# Patient Record
Sex: Male | Born: 1960 | Race: Black or African American | Hispanic: No | Marital: Single | Smoking: Current every day smoker
Health system: Southern US, Community
[De-identification: ages and names within clinical notes are randomized; demographics above are authoritative.]

## PROBLEM LIST (undated history)

## (undated) ENCOUNTER — Emergency Department (HOSPITAL_COMMUNITY): Payer: Self-pay | Source: Home / Self Care

## (undated) ENCOUNTER — Emergency Department (HOSPITAL_COMMUNITY): Disposition: A | Discharge status: Home / Self Care | Payer: Self-pay

## (undated) DIAGNOSIS — I10 Essential (primary) hypertension: Secondary | ICD-10-CM

## (undated) DIAGNOSIS — C801 Malignant (primary) neoplasm, unspecified: Secondary | ICD-10-CM

## (undated) DIAGNOSIS — M5020 Other cervical disc displacement, unspecified cervical region: Secondary | ICD-10-CM

## (undated) DIAGNOSIS — F329 Major depressive disorder, single episode, unspecified: Secondary | ICD-10-CM

## (undated) DIAGNOSIS — M1990 Unspecified osteoarthritis, unspecified site: Secondary | ICD-10-CM

## (undated) DIAGNOSIS — F32A Depression, unspecified: Secondary | ICD-10-CM

## (undated) DIAGNOSIS — C649 Malignant neoplasm of unspecified kidney, except renal pelvis: Secondary | ICD-10-CM

## (undated) DIAGNOSIS — I639 Cerebral infarction, unspecified: Secondary | ICD-10-CM

## (undated) DIAGNOSIS — M549 Dorsalgia, unspecified: Secondary | ICD-10-CM

## (undated) DIAGNOSIS — D573 Sickle-cell trait: Secondary | ICD-10-CM

## (undated) DIAGNOSIS — G8929 Other chronic pain: Secondary | ICD-10-CM

## (undated) DIAGNOSIS — M542 Cervicalgia: Secondary | ICD-10-CM

---

## 2009-12-05 ENCOUNTER — Emergency Department (HOSPITAL_COMMUNITY): Admission: EM | Admit: 2009-12-05 | Discharge: 2009-12-05 | Payer: Self-pay | Admitting: Family Medicine

## 2012-03-06 ENCOUNTER — Emergency Department (HOSPITAL_COMMUNITY)
Admission: EM | Admit: 2012-03-06 | Discharge: 2012-03-06 | Disposition: A | Discharge status: Home / Self Care | Payer: No Typology Code available for payment source | Attending: Emergency Medicine | Admitting: Emergency Medicine

## 2012-03-06 ENCOUNTER — Encounter (HOSPITAL_COMMUNITY): Payer: Self-pay | Admitting: Cardiology

## 2012-03-06 ENCOUNTER — Emergency Department (HOSPITAL_COMMUNITY): Payer: No Typology Code available for payment source

## 2012-03-06 DIAGNOSIS — Z043 Encounter for examination and observation following other accident: Secondary | ICD-10-CM | POA: Insufficient documentation

## 2012-03-06 DIAGNOSIS — Z041 Encounter for examination and observation following transport accident: Secondary | ICD-10-CM

## 2012-03-06 DIAGNOSIS — M542 Cervicalgia: Secondary | ICD-10-CM | POA: Insufficient documentation

## 2012-03-06 DIAGNOSIS — M549 Dorsalgia, unspecified: Secondary | ICD-10-CM | POA: Insufficient documentation

## 2012-03-06 MED ORDER — CYCLOBENZAPRINE HCL 10 MG PO TABS: 10.0000 mg | ORAL_TABLET | Freq: Two times a day (BID) | ORAL | Status: AC | PRN

## 2012-03-06 MED ORDER — TRAMADOL HCL 50 MG PO TABS: 50.0000 mg | ORAL_TABLET | Freq: Four times a day (QID) | ORAL | Status: AC | PRN
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
## 2012-03-09 NOTE — ED Provider Notes (Signed)
Medical screening examination/treatment/procedure(s) were performed by non-physician practitioner and as supervising physician I was immediately available for consultation/collaboration.   Cyndra Numbers, MD 03/09/12 1534

## 2013-08-12 ENCOUNTER — Emergency Department (INDEPENDENT_AMBULATORY_CARE_PROVIDER_SITE_OTHER)
Admission: EM | Admit: 2013-08-12 | Discharge: 2013-08-12 | Disposition: A | Discharge status: Home / Self Care | Payer: 59 | Source: Home / Self Care | Attending: Family Medicine | Admitting: Family Medicine

## 2013-08-12 ENCOUNTER — Encounter (HOSPITAL_COMMUNITY): Payer: Self-pay | Admitting: Emergency Medicine

## 2013-08-12 DIAGNOSIS — J069 Acute upper respiratory infection, unspecified: Secondary | ICD-10-CM

## 2013-08-12 DIAGNOSIS — A084 Viral intestinal infection, unspecified: Secondary | ICD-10-CM

## 2013-08-12 DIAGNOSIS — A088 Other specified intestinal infections: Secondary | ICD-10-CM

## 2013-08-12 MED ORDER — ONDANSETRON HCL 8 MG PO TABS: 8.0000 mg | ORAL_TABLET | Freq: Three times a day (TID) | ORAL | Status: DC | PRN

## 2013-08-12 MED ORDER — ONDANSETRON 4 MG PO TBDP
ORAL_TABLET | ORAL | Status: AC
  Filled 2013-08-12: qty 2

## 2013-08-12 MED ORDER — PROMETHAZINE-CODEINE 6.25-10 MG/5ML PO SYRP: 5.0000 mL | ORAL_SOLUTION | Freq: Every evening | ORAL | Status: DC | PRN

## 2013-08-12 MED ORDER — IPRATROPIUM BROMIDE 0.06 % NA SOLN: 2.0000 | Freq: Four times a day (QID) | NASAL | Status: DC

## 2013-08-12 MED ORDER — ONDANSETRON 4 MG PO TBDP
8.0000 mg | ORAL_TABLET | Freq: Once | ORAL | Status: AC
  Administered 2013-08-12: 8 mg via ORAL

## 2013-08-12 NOTE — ED Notes (Signed)
C/o  N/v/d.  Feeling feverish.  Headache.  Nonproductive cough.  Sob, denies chest pain, no hx of asthma.  Pt has not tried any otc meds.  Symptoms present since Friday.

## 2013-08-12 NOTE — Discharge Instructions (Signed)
Thank you for coming in today.  Use Tylenol as needed for pain fevers or chills. Use Atrovent nasal spray for runny nose. Use codeine containing cough medication at bedtime as needed for bad cough. Do not take and drive or go to work. Use Zofran every 8 hours as needed for vomiting. You can take it again at 6 PM. Followup with your primary care provider for blood pressure recheck

## 2013-08-12 NOTE — ED Provider Notes (Signed)
Patrick Romero is a 53 y.o. male who presents to Urgent Care today for 3 days of cough congestion runny nose and occasional nausea vomiting or diarrhea. The nausea and vomiting are improving. He is able to eat and drink normally. He denies significant abdominal pain. He's tried some TheraFlu which helped a little. The cough is quite bothersome especially at night. No significant shortness of breath.   History reviewed. No pertinent past medical history. History  Substance Use Topics  . Smoking status: Current Every Day Smoker -- 0.50 packs/day    Types: Cigarettes  . Smokeless tobacco: Not on file  . Alcohol Use: Yes   ROS as above Medications: No current facility-administered medications for this encounter.   Current Outpatient Prescriptions  Medication Sig Dispense Refill  . Aspirin-Salicylamide-Caffeine (BC HEADACHE POWDER PO) Take 2 packets by mouth every 4 (four) hours as needed. For pain      . ibuprofen (ADVIL,MOTRIN) 200 MG tablet Take 400 mg by mouth every 6 (six) hours as needed. For pain      . ipratropium (ATROVENT) 0.06 % nasal spray Place 2 sprays into both nostrils 4 (four) times daily.  15 mL  1  . Multiple Vitamins-Minerals (MULTIVITAMINS THER. W/MINERALS) TABS Take 1 tablet by mouth daily.      . ondansetron (ZOFRAN) 8 MG tablet Take 1 tablet (8 mg total) by mouth every 8 (eight) hours as needed for nausea or vomiting.  20 tablet  0  . promethazine-codeine (PHENERGAN WITH CODEINE) 6.25-10 MG/5ML syrup Take 5 mLs by mouth at bedtime as needed for cough.  120 mL  0    Exam:  BP 163/98  Pulse 92  Temp(Src) 98.6 F (37 C) (Oral)  Resp 20  SpO2 99% Gen: Well NAD HEENT: EOMI,  MMM posterior pharynx with cobblestoning. Tympanic membranes are normal appearing bilaterally. Lungs: Normal work of breathing. CTABL Heart: RRR no MRG Abd: NABS, Soft. NT, ND Exts: Brisk capillary refill, warm and well perfused.    Assessment and Plan: 53 y.o. male with viral illness  including respiratory and gastroenteritis.  Plan to treat with Atrovent nasal spray, codeine containing cough medication, Zofran. Work note provided. Followup as needed.  Recommend patient followup with his primary care provider for blood pressure  Discussed warning signs or symptoms. Please see discharge instructions. Patient expresses understanding.    [name], MD 08/12/13 1058

## 2013-08-23 ENCOUNTER — Emergency Department (HOSPITAL_COMMUNITY)
Admission: EM | Admit: 2013-08-23 | Discharge: 2013-08-23 | Disposition: A | Discharge status: Home / Self Care | Payer: 59 | Attending: Emergency Medicine | Admitting: Emergency Medicine

## 2013-08-23 ENCOUNTER — Encounter (HOSPITAL_COMMUNITY): Payer: Self-pay | Admitting: Emergency Medicine

## 2013-08-23 ENCOUNTER — Emergency Department (HOSPITAL_COMMUNITY): Payer: 59

## 2013-08-23 DIAGNOSIS — F17200 Nicotine dependence, unspecified, uncomplicated: Secondary | ICD-10-CM | POA: Insufficient documentation

## 2013-08-23 DIAGNOSIS — M79642 Pain in left hand: Secondary | ICD-10-CM

## 2013-08-23 DIAGNOSIS — Y93E9 Activity, other interior property and clothing maintenance: Secondary | ICD-10-CM | POA: Insufficient documentation

## 2013-08-23 DIAGNOSIS — Z79899 Other long term (current) drug therapy: Secondary | ICD-10-CM | POA: Insufficient documentation

## 2013-08-23 DIAGNOSIS — S6990XA Unspecified injury of unspecified wrist, hand and finger(s), initial encounter: Secondary | ICD-10-CM | POA: Insufficient documentation

## 2013-08-23 DIAGNOSIS — S298XXA Other specified injuries of thorax, initial encounter: Secondary | ICD-10-CM | POA: Insufficient documentation

## 2013-08-23 DIAGNOSIS — W19XXXA Unspecified fall, initial encounter: Secondary | ICD-10-CM

## 2013-08-23 DIAGNOSIS — W11XXXA Fall on and from ladder, initial encounter: Secondary | ICD-10-CM | POA: Insufficient documentation

## 2013-08-23 DIAGNOSIS — Y92009 Unspecified place in unspecified non-institutional (private) residence as the place of occurrence of the external cause: Secondary | ICD-10-CM | POA: Insufficient documentation

## 2013-08-23 DIAGNOSIS — R0789 Other chest pain: Secondary | ICD-10-CM

## 2013-08-23 DIAGNOSIS — S59909A Unspecified injury of unspecified elbow, initial encounter: Secondary | ICD-10-CM | POA: Insufficient documentation

## 2013-08-23 DIAGNOSIS — S59919A Unspecified injury of unspecified forearm, initial encounter: Secondary | ICD-10-CM

## 2013-08-23 NOTE — ED Notes (Signed)
Pt reports he was cleaning his mothers gutters Wednesday evening when he fell approx 10 feet landing on his Left side. Pt states he hit the grill first then the paved patio. Pt c/o Left lateral rib pain, Left hip pain, and Left arm pain. Pt presents with a steady gait, no assistant needed with ambulation, pain with lifting his Left arm. Pt reports the pain is more intense with deep breathing

## 2013-08-23 NOTE — Discharge Instructions (Signed)

## 2013-08-23 NOTE — ED Notes (Signed)
Pt also reports Left hand "feeling funny."

## 2013-08-24 NOTE — ED Provider Notes (Signed)
CSN: [id number]     Arrival date & time 08/23/13  1204 History   First MD Initiated Contact with Patient 08/23/13 1806     Chief Complaint  Patient presents with  . Fall    HPI Comments: 53 yo M no significant PMHx presents with CC of left hand, left chest wall pain s/p fall.  Pt states he was on ladder cleaning gutters on Wednesday, when he lost his balance and fell 10 ft down onto a grill.  He fell on his left side, hitting his left chest wall, left wrist, left hand.  He has had pain at these sites since that time.  He denies head trauma, LOC, neck pain.  He denies any other complaints including no lacerations, deformities, paresthesias, focal deficits.  Pt has been taking epsom salt baths, alcohol rubs, and taking OTC meds for pain with some benefit.  Pt presents today for further evaluation for possible injury sustained 2/2 fall.  The history is provided by the patient. No language interpreter was used.    History reviewed. No pertinent past medical history. History reviewed. No pertinent past surgical history. History reviewed. No pertinent family history. History  Substance Use Topics  . Smoking status: Current Every Day Smoker -- 0.50 packs/day    Types: Cigarettes  . Smokeless tobacco: Not on file  . Alcohol Use: Yes    Review of Systems  Constitutional: Negative for fever and chills.  Respiratory: Negative for cough and shortness of breath.   Cardiovascular: Positive for chest pain. Negative for palpitations and leg swelling.  Gastrointestinal: Negative for nausea, vomiting, abdominal pain, diarrhea and constipation.  Musculoskeletal: Positive for arthralgias. Negative for back pain, gait problem, joint swelling, myalgias, neck pain and neck stiffness.  Skin: Negative for rash and wound.  Neurological: Negative for dizziness, weakness, light-headedness, numbness and headaches.  Hematological: Negative for adenopathy. Does not bruise/bleed easily.  All other systems reviewed  and are negative.      Allergies  Review of patient's allergies indicates no known allergies.  Home Medications   Current Outpatient Rx  Name  Route  Sig  Dispense  Refill  . Aspirin-Salicylamide-Caffeine (BC HEADACHE POWDER PO)   Oral   Take 2 packets by mouth every 4 (four) hours as needed. For pain         . ibuprofen (ADVIL,MOTRIN) 200 MG tablet   Oral   Take 400 mg by mouth every 6 (six) hours as needed. For pain         . ipratropium (ATROVENT) 0.06 % nasal spray   Each Nare   Place 2 sprays into both nostrils 4 (four) times daily.   15 mL   1   . Multiple Vitamins-Minerals (MULTIVITAMINS THER. W/MINERALS) TABS   Oral   Take 1 tablet by mouth daily.         . promethazine-codeine (PHENERGAN WITH CODEINE) 6.25-10 MG/5ML syrup   Oral   Take 5 mLs by mouth at bedtime as needed for cough.   120 mL   0    BP 161/99  Pulse 76  Temp(Src) 98.2 F (36.8 C)  Resp 16  SpO2 100% Physical Exam  Nursing note and vitals reviewed. Constitutional: He is oriented to person, place, and time. He appears well-developed and well-nourished.  HENT:  Head: Normocephalic and atraumatic.  Right Ear: External ear normal.  Mouth/Throat: Oropharynx is clear and moist.  Eyes: Conjunctivae are normal. Pupils are equal, round, and reactive to light.  Neck: Normal range of  motion. Neck supple.  Cardiovascular: Normal rate, regular rhythm and normal heart sounds.   Pulmonary/Chest: Effort normal and breath sounds normal. No respiratory distress. He has no wheezes. He has no rales. He exhibits tenderness.  Left chest wall TTP.  No deformities or signs of external trauma.  Abdominal: He exhibits no distension and no mass. There is no tenderness. There is no rebound and no guarding.  Musculoskeletal: Normal range of motion. He exhibits tenderness. He exhibits no edema.  TTP of first three digits of right hand, medial left wrist.  No snuffbox TTP.    Neurological: He is alert and  oriented to person, place, and time.  CN II-XII grossly intact.  No global sensory or motor deficits.  Skin: Skin is warm and dry.    ED Course  Procedures (including critical care time) Labs Review Labs Reviewed - No data to display Imaging Review Dg Ribs Unilateral W/chest Left  08/23/2013   CLINICAL DATA:  Left lower lateral rib pain  EXAM: LEFT RIBS AND CHEST - 3+ VIEW  COMPARISON:  None.  FINDINGS: No fracture or other bone lesions are seen involving the ribs. There is no evidence of pneumothorax or pleural effusion. Both lungs are clear. Heart size and mediastinal contours are within normal limits.  IMPRESSION: Negative.   Electronically Signed   By: Kathreen Devoid   On: 08/23/2013 13:07   Dg Forearm Left  08/23/2013   CLINICAL DATA:  Left-sided pain  EXAM: LEFT FOREARM - 2 VIEW  COMPARISON:  None.  FINDINGS: There is no evidence of fracture or other focal bone lesions. Soft tissues are unremarkable.  IMPRESSION: Negative.   Electronically Signed   By: Kathreen Devoid   On: 08/23/2013 13:05   Dg Hand Complete Left  08/23/2013   CLINICAL DATA:  FALL, left hand pain  EXAM: LEFT HAND - COMPLETE 3+ VIEW  COMPARISON:  None.  FINDINGS: There is no evidence of fracture or dislocation. There is no evidence of arthropathy or other focal bone abnormality. Soft tissues are unremarkable.  IMPRESSION: Negative.   Electronically Signed   By: Kathreen Devoid   On: 08/23/2013 13:07    EKG Interpretation   None       MDM   Final diagnoses:  Fall  Chest wall pain  Left hand pain   53 yo M no significant PMHx presents with CC of left hand, left chest wall pain s/p fall.  Filed Vitals:   08/23/13 1838  BP: 161/99  Pulse: 76  Temp:   Resp: 16   Physical exam as above.  Pt with TTP of left chest wall, left hand, left wrist.  No other injuries apparent.  XRs are negative for rib fx, negative for left hand fx, negative for left wrist, or forearm fx.  No other images or labs required at this  time.  Pt states pain is well controlled with motrin at home and would like to continue this as outpatient.  Pt to be d/c home in good condition.  Encouraged to continue supportive care, RICE therapy.  F/u with PCP in 1 week.  Return precautions given.  Pt understands and agrees with plan.  I have discussed pt's care plan with Dr. Maryan Rued.  Sinda Du, MD      Sinda Du, MD 08/24/13 [phone]

## 2013-08-24 NOTE — ED Provider Notes (Signed)
I saw and evaluated the patient, reviewed the resident's note and I agree with the findings and plan.  EKG Interpretation   None       Pt with mechanical fall 2 days ago with rib, arm and hip pain.  However pt able to ambulate without difficulty.  No breathing trouble and o/w well appearing.  Plain films neg.  Will d/c.  Blanchie Dessert, MD 08/24/13 1527

## 2013-08-28 ENCOUNTER — Encounter (HOSPITAL_COMMUNITY): Payer: Self-pay | Admitting: Emergency Medicine

## 2013-08-28 ENCOUNTER — Emergency Department (HOSPITAL_COMMUNITY)
Admission: EM | Admit: 2013-08-28 | Discharge: 2013-08-28 | Disposition: A | Discharge status: Home / Self Care | Payer: 59 | Attending: Emergency Medicine | Admitting: Emergency Medicine

## 2013-08-28 ENCOUNTER — Telehealth (HOSPITAL_COMMUNITY): Payer: Self-pay

## 2013-08-28 DIAGNOSIS — Z09 Encounter for follow-up examination after completed treatment for conditions other than malignant neoplasm: Secondary | ICD-10-CM | POA: Insufficient documentation

## 2013-08-28 DIAGNOSIS — Z79899 Other long term (current) drug therapy: Secondary | ICD-10-CM | POA: Insufficient documentation

## 2013-08-28 DIAGNOSIS — F17200 Nicotine dependence, unspecified, uncomplicated: Secondary | ICD-10-CM | POA: Insufficient documentation

## 2013-08-28 NOTE — ED Notes (Signed)
Pt sts seen here on 2/13 after falling off ladder at work; pt given note to return to work but needs note stating left thumb is cleared for working as well

## 2013-08-28 NOTE — Discharge Instructions (Signed)
Read the information below.  You may return to the Emergency Department at any time for worsening condition or any new symptoms that concern you.  If you develop redness, swelling, pus draining from the wound, or fevers greater than 100.4, return to the ER immediately for a recheck.   °

## 2013-08-28 NOTE — ED Provider Notes (Signed)
Medical screening examination/treatment/procedure(s) were performed by non-physician practitioner and as supervising physician I was immediately available for consultation/collaboration.  EKG Interpretation   None         Hoy Morn, MD 08/28/13 1700

## 2013-08-28 NOTE — ED Provider Notes (Signed)
CSN: [id number]     Arrival date & time 08/28/13  1123 History  This chart was scribed for non-physician practitioner, Clayton Bibles, PA-C working with Hoy Morn, MD by Frederich Balding, ED scribe. This patient was seen in room TR06C/TR06C and the patient's care was started at 12:41 PM.   Chief Complaint  Patient presents with  . needs work note    The history is provided by the patient. No language interpreter was used.   HPI Comments: Patrick Romero is a 53 y.o. male who presents to the Emergency Department for a note to return to work. Pt was evaluated for left hand pain 5 days ago after falling off of a ladder at work. He denies current hand or thumb pain. States he has full use of his hands and no complaints.  Would like to go back to work.   History reviewed. No pertinent past medical history. History reviewed. No pertinent past surgical history. History reviewed. No pertinent family history. History  Substance Use Topics  . Smoking status: Current Every Day Smoker -- 0.50 packs/day    Types: Cigarettes  . Smokeless tobacco: Not on file  . Alcohol Use: Yes    Review of Systems  Constitutional: Negative for fever.  Musculoskeletal: Negative for arthralgias.  Skin: Negative for color change and wound.  Allergic/Immunologic: Negative for immunocompromised state.  Neurological: Negative for weakness and numbness.  Hematological: Does not bruise/bleed easily.  All other systems reviewed and are negative.   Allergies  Review of patient's allergies indicates no known allergies.  Home Medications   Current Outpatient Rx  Name  Route  Sig  Dispense  Refill  . Aspirin-Salicylamide-Caffeine (BC HEADACHE POWDER PO)   Oral   Take 2 packets by mouth every 4 (four) hours as needed. For pain         . ibuprofen (ADVIL,MOTRIN) 200 MG tablet   Oral   Take 400 mg by mouth every 6 (six) hours as needed. For pain         . ipratropium (ATROVENT) 0.06 % nasal spray   Each Nare  Place 2 sprays into both nostrils 4 (four) times daily.   15 mL   1   . Multiple Vitamins-Minerals (MULTIVITAMINS THER. W/MINERALS) TABS   Oral   Take 1 tablet by mouth daily.         . promethazine-codeine (PHENERGAN WITH CODEINE) 6.25-10 MG/5ML syrup   Oral   Take 5 mLs by mouth at bedtime as needed for cough.   120 mL   0    BP 154/103  Pulse 89  Temp(Src) 98.8 F (37.1 C) (Oral)  Resp 18  SpO2 100%  Physical Exam  Nursing note and vitals reviewed. Constitutional: He appears well-developed and well-nourished. No distress.  HENT:  Head: Normocephalic and atraumatic.  Neck: Neck supple.  Pulmonary/Chest: Effort normal.  Musculoskeletal:       Left hand: Normal.  Blood blister to distal tip of left thumb. Full active ROM of thumb. Sensation intact. Cap refill less than 2 seconds. Full strength. No tenderness.   Neurological: He is alert.  Skin: He is not diaphoretic.    ED Course  Procedures (including critical care time)  DIAGNOSTIC STUDIES: Oxygen Saturation is 100% on RA, normal by my interpretation.    COORDINATION OF CARE: 12:43 PM-Discussed treatment plan which includes a note to return to work with pt at bedside and pt agreed to plan.   Labs Review Labs Reviewed - No data  to display Imaging Review No results found.  EKG Interpretation   None       MDM   Final diagnoses:  Follow-up exam    Pt with injury to hand after fall from ladder.  Pt now without any complaints, requests a note to return to full duty at work.  Pt has a normal left hand exam.  Neurovascularly intact.  Only abnormality is small intact blister at distal fingertip.  This is not infected, not necrotic, nontender.  Pt to return to full duty as requested.  Discussed result, findings, treatment, and follow up  with patient.  Pt given return precautions.  Pt verbalizes understanding and agrees with plan.      I personally performed the services described in this documentation, which  was scribed in my presence. The recorded information has been reviewed and is accurate.   Clayton Bibles, PA-C 08/28/13 1354

## 2013-08-28 NOTE — ED Notes (Signed)
Pt to ED asking for release to return to work.  Pt was given work note having him return to work on 08/26/2013 without restrictions.  Pt to be revaluated if he needs note to be more in depth.

## 2013-08-28 NOTE — ED Notes (Signed)
Pt adamant about going back to work!

## 2013-12-16 ENCOUNTER — Encounter (HOSPITAL_COMMUNITY): Payer: Self-pay | Admitting: Emergency Medicine

## 2013-12-16 ENCOUNTER — Emergency Department (INDEPENDENT_AMBULATORY_CARE_PROVIDER_SITE_OTHER)
Admission: EM | Admit: 2013-12-16 | Discharge: 2013-12-16 | Disposition: A | Discharge status: Home / Self Care | Payer: Self-pay | Source: Home / Self Care | Attending: Family Medicine | Admitting: Family Medicine

## 2013-12-16 DIAGNOSIS — D1779 Benign lipomatous neoplasm of other sites: Secondary | ICD-10-CM

## 2013-12-16 DIAGNOSIS — D171 Benign lipomatous neoplasm of skin and subcutaneous tissue of trunk: Secondary | ICD-10-CM

## 2013-12-16 DIAGNOSIS — H547 Unspecified visual loss: Secondary | ICD-10-CM

## 2013-12-16 LAB — POCT I-STAT, CHEM 8
BUN: 11 mg/dL (ref 6–23)
CHLORIDE: 103 meq/L (ref 96–112)
CREATININE: 1.3 mg/dL (ref 0.50–1.35)
Calcium, Ion: 1.37 mmol/L — ABNORMAL HIGH (ref 1.12–1.23)
GLUCOSE: 77 mg/dL (ref 70–99)
HEMATOCRIT: 44 % (ref 39.0–52.0)
Hemoglobin: 15 g/dL (ref 13.0–17.0)
POTASSIUM: 4 meq/L (ref 3.7–5.3)
Sodium: 142 mEq/L (ref 137–147)
TCO2: 23 mmol/L (ref 0–100)

## 2013-12-16 NOTE — ED Provider Notes (Deleted)
CSN: [id number]     Arrival date & time 12/16/13  6440 History   First MD Initiated Contact with Patient 12/16/13 1106     Chief Complaint  Patient presents with  . Cyst    left lower back   (Consider location/radiation/quality/duration/timing/severity/associated sxs/prior Treatment) Patient is a 53 y.o. male presenting with back pain. The history is provided by the patient. No language interpreter was used.  Back Pain Location:  Thoracic spine Quality:  Aching Radiates to:  Does not radiate Pain severity:  No pain Pain is:  Worse during the day Onset quality:  Gradual Duration:  2 weeks Timing:  Constant Progression:  Worsening Chronicity:  New Relieved by:  Nothing Worsened by:  Nothing tried Ineffective treatments:  None tried Associated symptoms: no abdominal pain, no chest pain and no weakness   Pt reports he has increased visual difficulty both eyes.  No eye exam in over 10 years.   History reviewed. No pertinent past medical history. History reviewed. No pertinent past surgical history. History reviewed. No pertinent family history. History  Substance Use Topics  . Smoking status: Current Every Day Smoker -- 0.50 packs/day    Types: Cigarettes  . Smokeless tobacco: Not on file  . Alcohol Use: Yes    Review of Systems  Cardiovascular: Negative for chest pain.  Gastrointestinal: Negative for abdominal pain.  Musculoskeletal: Positive for back pain.  Neurological: Negative for weakness.  All other systems reviewed and are negative.   Allergies  Review of patient's allergies indicates no known allergies.  Home Medications   Prior to Admission medications   Medication Sig Start Date End Date Taking? Authorizing Provider  acetaminophen (TYLENOL) 500 MG tablet Take 1,000 mg by mouth every 6 (six) hours as needed for mild pain.   Yes Historical Provider, MD  Multiple Vitamins-Minerals (MULTIVITAMINS THER. W/MINERALS) TABS Take 1 tablet by mouth daily.   Yes  Historical Provider, MD  Aspirin-Salicylamide-Caffeine (BC HEADACHE POWDER PO) Take 2 packets by mouth every 4 (four) hours as needed. For pain    Historical Provider, MD  ibuprofen (ADVIL,MOTRIN) 200 MG tablet Take 400 mg by mouth every 6 (six) hours as needed. For pain    Historical Provider, MD   BP 114/80  Pulse 101  Temp(Src) 98.5 F (36.9 C) (Oral)  Resp 16  SpO2 98% Physical Exam  Constitutional: He is oriented to person, place, and time. He appears well-developed and well-nourished.  HENT:  Head: Normocephalic and atraumatic.  Right Ear: External ear normal.  Left Ear: External ear normal.  Nose: Nose normal.  Mouth/Throat: Oropharynx is clear and moist.  Eyes: Conjunctivae and EOM are normal. Pupils are equal, round, and reactive to light.  Neck: Normal range of motion. Neck supple.  Cardiovascular: Normal rate and regular rhythm.   Pulmonary/Chest: Effort normal.  Abdominal: Soft.  Musculoskeletal: Normal range of motion.  Neurological: He is alert and oriented to person, place, and time. He has normal reflexes.  Skin: Skin is warm.  Psychiatric: He has a normal mood and affect.    ED Course  Procedures (including critical care time) Labs Review Labs Reviewed  POCT I-STAT, CHEM 8 - Abnormal; Notable for the following:    Calcium, Ion 1.37 (*)    All other components within normal limits  I-STAT CHEM 8, ED    Imaging Review No results found.   MDM  istat  Glucose [address]      [place], [place] 12/16/13 [place], PA-C  12/24/13 [place], PA-C 12/24/13 1511

## 2013-12-16 NOTE — ED Notes (Signed)
Reports having a knot on the left lower back for 2 to 3 wks.  Having pain on left side with numbness.  Left sided headaches and pain on left side of neck.   Denies any known injury.  No relief with otc pain meds.

## 2013-12-25 NOTE — ED Provider Notes (Signed)
CSN: [id number]     Arrival date & time 12/16/13  1829 History   First MD Initiated Contact with Patient 12/16/13 1106     Chief Complaint  Patient presents with  . Cyst    left lower back   (Consider location/radiation/quality/duration/timing/severity/associated sxs/prior Treatment) HPI Comments: Pt has multiple complaints.  Pt reports he has a knot on his back that he has had for about 3 weeks.  Pt concerned about abscess.  Pt also concerned about diabetes and is concerned because vision is worse.  (wearing readers)  Last vision check greater than 10 years.  No primary care.  Pt also soreness in his neck and headaches of and on   The history is provided by the patient. No language interpreter was used.    History reviewed. No pertinent past medical history. History reviewed. No pertinent past surgical history. History reviewed. No pertinent family history. History  Substance Use Topics  . Smoking status: Current Every Day Smoker -- 0.50 packs/day    Types: Cigarettes  . Smokeless tobacco: Not on file  . Alcohol Use: Yes    Review of Systems  Eyes: Positive for visual disturbance.  All other systems reviewed and are negative.   Allergies  Review of patient's allergies indicates no known allergies.  Home Medications   Prior to Admission medications   Medication Sig Start Date End Date Taking? Authorizing Provider  acetaminophen (TYLENOL) 500 MG tablet Take 1,000 mg by mouth every 6 (six) hours as needed for mild pain.   Yes Historical Provider, MD  Multiple Vitamins-Minerals (MULTIVITAMINS THER. W/MINERALS) TABS Take 1 tablet by mouth daily.   Yes Historical Provider, MD  Aspirin-Salicylamide-Caffeine (BC HEADACHE POWDER PO) Take 2 packets by mouth every 4 (four) hours as needed. For pain    Historical Provider, MD  ibuprofen (ADVIL,MOTRIN) 200 MG tablet Take 400 mg by mouth every 6 (six) hours as needed. For pain    Historical Provider, MD   BP 114/80  Pulse 101  Temp(Src)  98.5 F (36.9 C) (Oral)  Resp 16  SpO2 98% Physical Exam  Nursing note and vitals reviewed. Constitutional: He is oriented to person, place, and time. He appears well-developed and well-nourished.  HENT:  Head: Normocephalic.  Eyes: Conjunctivae and EOM are normal. Pupils are equal, round, and reactive to light.  Neck: Normal range of motion.  Pulmonary/Chest: Effort normal.  Abdominal: He exhibits no distension.  Musculoskeletal: Normal range of motion.  Lipoma lower back  Neurological: He is alert and oriented to person, place, and time.  Psychiatric: He has a normal mood and affect.    ED Course  Procedures (including critical care time) Labs Review Labs Reviewed  POCT I-STAT, CHEM 8 - Abnormal; Notable for the following:    Calcium, Ion 1.37 (*)    All other components within normal limits  I-STAT CHEM 8, ED    Imaging Review No results found.   MDM Pt counseled on lipoma.   Pt needs primary care MD.   1. Lipoma of back   2. Visual impairment    Pt [facility] [place] [facility].   Pt advised he needs an eye exam    Fransico Meadow, PA-C 12/25/13 9371

## 2013-12-25 NOTE — ED Provider Notes (Signed)
Medical screening examination/treatment/procedure(s) were performed by a resident physician or non-physician practitioner and as the supervising physician I was immediately available for consultation/collaboration.  Lynne Leader, MD    [name], MD 12/25/13 2016

## 2014-03-20 ENCOUNTER — Emergency Department (INDEPENDENT_AMBULATORY_CARE_PROVIDER_SITE_OTHER)
Admission: EM | Admit: 2014-03-20 | Discharge: 2014-03-20 | Disposition: A | Discharge status: Home / Self Care | Payer: Self-pay | Source: Home / Self Care | Attending: Family Medicine | Admitting: Family Medicine

## 2014-03-20 ENCOUNTER — Ambulatory Visit (HOSPITAL_COMMUNITY): Payer: Self-pay | Attending: Family Medicine

## 2014-03-20 ENCOUNTER — Encounter (HOSPITAL_COMMUNITY): Payer: Self-pay | Admitting: Emergency Medicine

## 2014-03-20 DIAGNOSIS — G8929 Other chronic pain: Secondary | ICD-10-CM | POA: Insufficient documentation

## 2014-03-20 DIAGNOSIS — M545 Low back pain, unspecified: Secondary | ICD-10-CM

## 2014-03-20 DIAGNOSIS — M5412 Radiculopathy, cervical region: Secondary | ICD-10-CM

## 2014-03-20 DIAGNOSIS — M47812 Spondylosis without myelopathy or radiculopathy, cervical region: Secondary | ICD-10-CM | POA: Insufficient documentation

## 2014-03-20 MED ORDER — CYCLOBENZAPRINE HCL 5 MG PO TABS: 5.0000 mg | ORAL_TABLET | Freq: Every evening | ORAL | Status: DC | PRN

## 2014-03-20 MED ORDER — PREDNISONE 10 MG PO KIT: PACK | ORAL | Status: DC

## 2014-03-20 NOTE — ED Provider Notes (Signed)
MAXDEN NAJI is a 53 y.o. male who presents to Urgent Care today for back pain. Patient has a history of chronic back and neck pain. He was seen for this problem back in June. He would like to followup for this issue. He's tried multiple over-the-counter medications which have not helped. The back pain is bilateral as is the neck pain. He denies any significant lower extremity radiating pain but does note some left upper extremity radiating pain to his hand. He denies any weakness or numbness throughout. No bowel bladder dysfunction or difficulty walking. Additionally he has a small mass in his right low back that he attributes to the pain. This was also present has been unchanged since June. In June it was thought to be a lipoma. No fevers or chills nausea vomiting or diarrhea.   History reviewed. No pertinent past medical history. History  Substance Use Topics  . Smoking status: Current Every Day Smoker -- 0.50 packs/day    Types: Cigarettes  . Smokeless tobacco: Not on file  . Alcohol Use: Yes   ROS as above Medications: No current facility-administered medications for this encounter.   Current Outpatient Prescriptions  Medication Sig Dispense Refill  . acetaminophen (TYLENOL) 500 MG tablet Take 1,000 mg by mouth every 6 (six) hours as needed for mild pain.      . Aspirin-Salicylamide-Caffeine (BC HEADACHE POWDER PO) Take 2 packets by mouth every 4 (four) hours as needed. For pain      . cyclobenzaprine (FLEXERIL) 5 MG tablet Take 1 tablet (5 mg total) by mouth at bedtime as needed for muscle spasms.  20 tablet  0  . ibuprofen (ADVIL,MOTRIN) 200 MG tablet Take 400 mg by mouth every 6 (six) hours as needed. For pain      . Multiple Vitamins-Minerals (MULTIVITAMINS THER. W/MINERALS) TABS Take 1 tablet by mouth daily.      . PredniSONE 10 MG KIT 12 day dose pack po  1 kit  0    Exam:  BP 140/90  Pulse 91  Temp(Src) 98.6 F (37 C) (Oral)  Resp 16  SpO2 98% Gen: Well NAD HEENT:  EOMI,  MMM Lungs: Normal work of breathing. CTABL Heart: RRR no MRG Abd: NABS, Soft. Nondistended, Nontender Exts: Brisk capillary refill, warm and well perfused.  Back: Nontender to lumbar spinal midline. Right low back area small soft mobile mass consistent with lipoma is present. Patient is tender at the bilateral SI joints and lumbar paraspinals. Low back range of motion flexion and extension are decreased due to pain. Lower extremity strength reflexes and sensation are normal and equal bilaterally. Neck: Nontender to spinal midline. Mildly tender bilateral cervical paraspinals and trapezius. Range of motion normal to flexion extension limited bilateral rotation range of motion secondary to pain. Limited lateral flexion bilaterally secondary to pain. Upper extremity strength sensation and reflexes are intact throughout.  No results found for this or any previous visit (from the past 24 hour(s)). Dg Cervical Spine 2-3 Views  03/20/2014   CLINICAL DATA:  Chronic neck pain.  EXAM: CERVICAL SPINE - 2-3 VIEW  COMPARISON:  Cervical spine radiographs 03/06/2012  FINDINGS: The cervical spine is visualized from the skullbase through C7-T1. Dental caries and periodontal disease are evident. Prevertebral soft tissues are within normal limits. Vertebral body heights and alignment are normal.  Endplate degenerative changes are evident at C5-6 and C6-7 with some progression since the prior study. No acute fracture or traumatic subluxation is evident.  IMPRESSION: Progressive endplate degenerative change  in uncovertebral disease at C5-6 and C6-7 without acute abnormality.   Electronically Signed   By: Lawrence Santiago M.D.   On: 03/20/2014 10:56   Dg Lumbar Spine 2-3 Views  03/20/2014   CLINICAL DATA:  Chronic back pain.  EXAM: LUMBAR SPINE - 2-3 VIEW  COMPARISON:  03/06/2012  FINDINGS: There is no evidence of lumbar spine fracture. Alignment is normal. Intervertebral disc spaces are maintained.  IMPRESSION:  Normal exam.   Electronically Signed   By: Rozetta Nunnery M.D.   On: 03/20/2014 10:58    Assessment and Plan: 53 y.o. male with  1) cervical pain and radiculopathy: Likely due to DDD. Plan treat with prednisone and followup with sports medicine 2) lumbago: Likely due to myofascial disruption. Plan as above 3) lipoma: Followup with Gen. surgery as noted previous notes.  Discussed warning signs or symptoms. Please see discharge instructions. Patient expresses understanding.   This note was created using Systems analyst. Any transcription errors are unintended.    [name], MD 03/20/14 1131

## 2014-03-20 NOTE — Discharge Instructions (Signed)
Thank you for coming in today. Take prednisone as directed for 12 days Use Flexeril at bedtime as needed Followup the sports [place] back or go to the emergency room if you notice new weakness new numbness problems walking or bowel or bladder problems.   Cervical Radiculopathy Cervical radiculopathy happens when a nerve in the neck is pinched or bruised by a slipped (herniated) disk or by arthritic changes in the bones of the cervical spine. This can occur due to an injury or as part of the normal aging process. Pressure on the cervical nerves can cause pain or numbness that runs from your neck all the way down into your arm and fingers. CAUSES  There are many possible causes, including:  Injury.  Muscle tightness in the neck from overuse.  Swollen, painful joints (arthritis).  Breakdown or degeneration in the bones and joints of the spine (spondylosis) due to aging.  Bone spurs that may develop near the cervical nerves. SYMPTOMS  Symptoms include pain, weakness, or numbness in the affected arm and hand. Pain can be severe or irritating. Symptoms may be worse when extending or turning the neck. DIAGNOSIS  Your caregiver will ask about your symptoms and do a physical exam. He or she may test your strength and reflexes. X-rays, CT scans, and MRI scans may be needed in cases of injury or if the symptoms do not go away after a period of time. Electromyography (EMG) or nerve conduction testing may be done to study how your nerves and muscles are working. TREATMENT  Your caregiver may recommend certain exercises to help relieve your symptoms. Cervical radiculopathy can, and often does, get better with time and treatment. If your problems continue, treatment options may include:  Wearing a soft collar for short periods of time.  Physical therapy to strengthen the neck muscles.  Medicines, such as nonsteroidal anti-inflammatory drugs (NSAIDs), oral corticosteroids, or spinal  injections.  Surgery. Different types of surgery may be done depending on the cause of your problems. HOME CARE INSTRUCTIONS   Put ice on the affected area.  Put ice in a plastic bag.  Place a towel between your skin and the bag.  Leave the ice on for 15-20 minutes, 03-04 times a day or as directed by your caregiver.  If ice does not help, you can try using heat. Take a warm shower or bath, or use a hot water bottle as directed by your caregiver.  You may try a gentle neck and shoulder massage.  Use a flat pillow when you sleep.  Only take over-the-counter or prescription medicines for pain, discomfort, or fever as directed by your caregiver.  If physical therapy was prescribed, follow your caregiver's directions.  If a soft collar was prescribed, use it as directed. SEEK IMMEDIATE MEDICAL CARE IF:   Your pain gets much worse and cannot be controlled with medicines.  You have weakness or numbness in your hand, arm, face, or leg.  You have a high fever or a stiff, rigid neck.  You lose bowel or bladder control (incontinence).  You have trouble with walking, balance, or speaking. MAKE SURE YOU:   Understand these instructions.  Will watch your condition.  Will get help right away if you are not doing well or get worse. Document Released: 03/22/2001 Document Revised: 09/19/2011 Document Reviewed: 02/08/2011 [facility] Patient Information 2015 [place], [place]. This information is not intended to replace advice given to you by your health care provider. Make sure you discuss any questions you  have with your health care provider. ° °

## 2014-03-20 NOTE — ED Notes (Signed)
C/o lower back pain that radiates across and up back.  Limited ROM of neck.  On set June.  No relief with otc meds.  Denies injury.

## 2015-03-21 ENCOUNTER — Encounter (HOSPITAL_COMMUNITY): Payer: Self-pay | Admitting: Emergency Medicine

## 2015-03-21 ENCOUNTER — Emergency Department (HOSPITAL_COMMUNITY)
Admission: EM | Admit: 2015-03-21 | Discharge: 2015-03-21 | Disposition: A | Discharge status: Home / Self Care | Payer: Self-pay | Attending: Emergency Medicine | Admitting: Emergency Medicine

## 2015-03-21 DIAGNOSIS — Z720 Tobacco use: Secondary | ICD-10-CM | POA: Insufficient documentation

## 2015-03-21 DIAGNOSIS — M542 Cervicalgia: Secondary | ICD-10-CM

## 2015-03-21 DIAGNOSIS — Z79899 Other long term (current) drug therapy: Secondary | ICD-10-CM | POA: Insufficient documentation

## 2015-03-21 DIAGNOSIS — M545 Low back pain: Secondary | ICD-10-CM | POA: Insufficient documentation

## 2015-03-21 DIAGNOSIS — M5030 Other cervical disc degeneration, unspecified cervical region: Secondary | ICD-10-CM | POA: Insufficient documentation

## 2015-03-21 HISTORY — DX: Other cervical disc displacement, unspecified cervical region: M50.20

## 2015-03-21 MED ORDER — TRAMADOL HCL 50 MG PO TABS
50.0000 mg | ORAL_TABLET | Freq: Once | ORAL | Status: AC
  Administered 2015-03-21: 50 mg via ORAL
  Filled 2015-03-21: qty 1

## 2015-03-21 MED ORDER — METHOCARBAMOL 500 MG PO TABS
500.0000 mg | ORAL_TABLET | Freq: Once | ORAL | Status: AC
  Administered 2015-03-21: 500 mg via ORAL
  Filled 2015-03-21: qty 1

## 2015-03-21 MED ORDER — METHOCARBAMOL 500 MG PO TABS: 500.0000 mg | ORAL_TABLET | Freq: Two times a day (BID) | ORAL | Status: DC | PRN

## 2015-03-21 MED ORDER — TRAMADOL HCL 50 MG PO TABS: 50.0000 mg | ORAL_TABLET | Freq: Four times a day (QID) | ORAL | Status: DC | PRN

## 2015-03-21 NOTE — Discharge Instructions (Signed)
Cervical Radiculopathy Cervical radiculopathy happens when a nerve in the neck is pinched or bruised by a slipped (herniated) disk or by arthritic changes in the bones of the cervical spine. This can occur due to an injury or as part of the normal aging process. Pressure on the cervical nerves can cause pain or numbness that runs from your neck all the way down into your arm and fingers. CAUSES  There are many possible causes, including:  Injury.  Muscle tightness in the neck from overuse.  Swollen, painful joints (arthritis).  Breakdown or degeneration in the bones and joints of the spine (spondylosis) due to aging.  Bone spurs that may develop near the cervical nerves. SYMPTOMS  Symptoms include pain, weakness, or numbness in the affected arm and hand. Pain can be severe or irritating. Symptoms may be worse when extending or turning the neck. DIAGNOSIS  Your caregiver will ask about your symptoms and do a physical exam. He or she may test your strength and reflexes. X-rays, CT scans, and MRI scans may be needed in cases of injury or if the symptoms do not go away after a period of time. Electromyography (EMG) or nerve conduction testing may be done to study how your nerves and muscles are working. TREATMENT  Your caregiver may recommend certain exercises to help relieve your symptoms. Cervical radiculopathy can, and often does, get better with time and treatment. If your problems continue, treatment options may include:  Wearing a soft collar for short periods of time.  Physical therapy to strengthen the neck muscles.  Medicines, such as nonsteroidal anti-inflammatory drugs (NSAIDs), oral corticosteroids, or spinal injections.  Surgery. Different types of surgery may be done depending on the cause of your problems. HOME CARE INSTRUCTIONS   Put ice on the affected area.  Put ice in a plastic bag.  Place a towel between your skin and the bag.  Leave the ice on for 15-20 minutes,  03-04 times a day or as directed by your caregiver.  If ice does not help, you can try using heat. Take a warm shower or bath, or use a hot water bottle as directed by your caregiver.  You may try a gentle neck and shoulder massage.  Use a flat pillow when you sleep.  Only take over-the-counter or prescription medicines for pain, discomfort, or fever as directed by your caregiver.  If physical therapy was prescribed, follow your caregiver's directions.  If a soft collar was prescribed, use it as directed. SEEK IMMEDIATE MEDICAL CARE IF:   Your pain gets much worse and cannot be controlled with medicines.  You have weakness or numbness in your hand, arm, face, or leg.  You have a high fever or a stiff, rigid neck.  You lose bowel or bladder control (incontinence).  You have trouble with walking, balance, or speaking. MAKE SURE YOU:   Understand these instructions.  Will watch your condition.  Will get help right away if you are not doing well or get worse. Document Released: 03/22/2001 Document Revised: 09/19/2011 Document Reviewed: 02/08/2011 [facility] Patient Information 2015 [place], [place]. This information is not intended to replace advice given to you by your health care provider. Make sure you discuss any questions you have with your health care provider. Back Pain, Adult Low back pain is very common. About 1 in 5 people have back pain.The cause of low back pain is rarely dangerous. The pain often gets better over time.About half of people with a sudden onset of back pain  feel better in just 2 weeks. About 8 in 10 people feel better by 6 weeks.  CAUSES Some common causes of back pain include:  Strain of the muscles or ligaments supporting the spine.  Wear and tear (degeneration) of the spinal discs.  Arthritis.  Direct injury to the back. DIAGNOSIS Most of the time, the direct cause of low back pain is not known.However, back pain can be treated effectively even  when the exact cause of the pain is unknown.Answering your caregiver's questions about your overall health and symptoms is one of the most accurate ways to make sure the cause of your pain is not dangerous. If your caregiver needs more information, he or she may order lab work or imaging tests (X-rays or MRIs).However, even if imaging tests show changes in your back, this usually does not require surgery. HOME CARE INSTRUCTIONS For many people, back pain returns.Since low back pain is rarely dangerous, it is often a condition that people can learn to [facility] their own.   Remain active. It is stressful on the back to sit or stand in one place. Do not sit, drive, or stand in one place for more than 30 minutes at a time. Take short walks on level surfaces as soon as pain allows.Try to increase the length of time you walk each day.  Do not stay in bed.Resting more than 1 or 2 days can delay your recovery.  Do not avoid exercise or work.Your body is made to move.It is not dangerous to be active, even though your back may hurt.Your back will likely heal faster if you return to being active before your pain is gone.  Pay attention to your body when you bend and lift. Many people have less discomfortwhen lifting if they bend their knees, keep the load close to their bodies,and avoid twisting. Often, the most comfortable positions are those that put less stress on your recovering back.  Find a comfortable position to sleep. Use a firm mattress and lie on your side with your knees slightly bent. If you lie on your back, put a pillow under your knees.  Only take over-the-counter or prescription medicines as directed by your caregiver. Over-the-counter medicines to reduce pain and inflammation are often the most helpful.Your caregiver may prescribe muscle relaxant drugs.These medicines help dull your pain so you can more quickly return to your normal activities and healthy exercise.  Put ice on  the injured area.  Put ice in a plastic bag.  Place a towel between your skin and the bag.  Leave the ice on for 15-20 minutes, 03-04 times a day for the first 2 to 3 days. After that, ice and heat may be alternated to reduce pain and spasms.  Ask your caregiver about trying back exercises and gentle massage. This may be of some benefit.  Avoid feeling anxious or stressed.Stress increases muscle tension and can worsen back pain.It is important to recognize when you are anxious or stressed and learn ways to manage it.Exercise is a great option. SEEK MEDICAL CARE IF:  You have pain that is not relieved with rest or medicine.  You have pain that does not improve in 1 week.  You have new symptoms.  You are generally not feeling well. SEEK IMMEDIATE MEDICAL CARE IF:   You have pain that radiates from your back into your legs.  You develop new bowel or bladder control problems.  You have unusual weakness or numbness in your arms or legs.  You develop  nausea or vomiting.  You develop abdominal pain.  You feel faint. Document Released: 06/27/2005 Document Revised: 12/27/2011 Document Reviewed: 10/29/2013 [facility] Patient Information 2015 [place], [place]. This information is not intended to replace advice given to you by your health care provider. Make sure you discuss any questions you have with your health care provider.  Back Exercises Back exercises help treat and prevent back injuries. The goal of back exercises is to increase the strength of your abdominal and back muscles and the flexibility of your back. These exercises should be started when you no longer have back pain. Back exercises include:  Pelvic Tilt. Lie on your back with your knees bent. Tilt your pelvis until the lower part of your back is against the floor. Hold this position 5 to 10 sec and repeat 5 to 10 times.  Knee to Chest. Pull first 1 knee up against your chest and hold for 20 to 30 seconds, repeat this  with the other knee, and then both knees. This may be done with the other leg straight or bent, whichever feels better.  Sit-Ups or Curl-Ups. Bend your knees 90 degrees. Start with tilting your pelvis, and do a partial, slow sit-up, lifting your trunk only 30 to 45 degrees off the floor. Take at least 2 to 3 seconds for each sit-up. Do not do sit-ups with your knees out straight. If partial sit-ups are difficult, simply do the above but with only tightening your abdominal muscles and holding it as directed.  Hip-Lift. Lie on your back with your knees flexed 90 degrees. Push down with your feet and shoulders as you raise your hips a couple inches off the floor; hold for 10 seconds, repeat 5 to 10 times.  Back arches. Lie on your stomach, propping yourself up on bent elbows. Slowly press on your hands, causing an arch in your low back. Repeat 3 to 5 times. Any initial stiffness and discomfort should lessen with repetition over time.  Shoulder-Lifts. Lie face down with arms beside your body. Keep hips and torso pressed to floor as you slowly lift your head and shoulders off the floor. Do not overdo your exercises, especially in the beginning. Exercises may cause you some mild back discomfort which lasts for a few minutes; however, if the pain is more severe, or lasts for more than 15 minutes, do not continue exercises until you see your caregiver. Improvement with exercise therapy for back problems is slow.  See your caregivers for assistance with developing a proper back exercise program. Document Released: 08/04/2004 Document Revised: 09/19/2011 Document Reviewed: 04/28/2011 [facility] Patient Information 2015 [place], [place]. This information is not intended to replace advice given to you by your health care provider. Make sure you discuss any questions you have with your health care provider.

## 2015-03-21 NOTE — ED Provider Notes (Signed)
CSN: [id number]     Arrival date & time 03/21/15  1149 History   First MD Initiated Contact with Patient 03/21/15 1716     Chief Complaint  Patient presents with  . Headache    Patrick Romero is a 54 y.o. male with a history of cervical DDD who presents to the ED complaining of bilateral neck pain and muscle spasm and bilateral low back pain. The patient reports that he has a history of DDD in his neck and has chronic pain there. He reports his pain has been worse over the past 25 days. He denies new injury to his neck or back. He reports it feels like his muscles are in spasm. He reports he has previously taken a muscle relaxer which helped with his muscle spasm pain but he no longer has any. He also reports bilateral low back pain which is chronic and feels like muscle spasm. Currently complains of 10 out of 10 neck and low back pain. He reports he's been taking Aleve at home intermittently with little relief. Patient also reports he has tingling in his bilateral hands that is been intermittent for several months. Patient reports his pain in his back is worse with movement or bending. The patient denies fevers, chills, injury, trauma, numbness, weakness, urinary symptoms, loss of bladder control, loss of bowel control, history of cancer, history of IVDU, abdominal pain, nausea, vomiting headaches, or neck stiffness.  (Consider location/radiation/quality/duration/timing/severity/associated sxs/prior Treatment) HPI  Past Medical History  Diagnosis Date  . Herniated disc, cervical    History reviewed. No pertinent past surgical history. No family history on file. Social History  Substance Use Topics  . Smoking status: Current Every Day Smoker -- 0.25 packs/day    Types: Cigarettes  . Smokeless tobacco: None  . Alcohol Use: Yes    Review of Systems  Constitutional: Negative for fever and chills.  HENT: Negative for congestion and sore throat.   Eyes: Negative for visual disturbance.   Respiratory: Negative for cough, shortness of breath and wheezing.   Cardiovascular: Negative for chest pain and palpitations.  Gastrointestinal: Negative for nausea, vomiting, abdominal pain and diarrhea.  Genitourinary: Negative for dysuria, urgency, frequency, hematuria, decreased urine volume and difficulty urinating.  Musculoskeletal: Positive for back pain and neck pain. Negative for myalgias, gait problem and neck stiffness.  Skin: Negative for rash.  Neurological: Negative for weakness, light-headedness, numbness and headaches.      Allergies  Review of patient's allergies indicates no known allergies.  Home Medications   Prior to Admission medications   Medication Sig Start Date End Date Taking? Authorizing Provider  acetaminophen (TYLENOL) 500 MG tablet Take 1,000 mg by mouth every 6 (six) hours as needed for mild pain.    Historical Provider, MD  Aspirin-Salicylamide-Caffeine (BC HEADACHE POWDER PO) Take 2 packets by mouth every 4 (four) hours as needed. For pain    Historical Provider, MD  cyclobenzaprine (FLEXERIL) 5 MG tablet Take 1 tablet (5 mg total) by mouth at bedtime as needed for muscle spasms. 03/20/14   [name], MD  ibuprofen (ADVIL,MOTRIN) 200 MG tablet Take 400 mg by mouth every 6 (six) hours as needed. For pain    Historical Provider, MD  methocarbamol (ROBAXIN) 500 MG tablet Take 1 tablet (500 mg total) by mouth 2 (two) times daily as needed for muscle spasms. 03/21/15   [name], PA-C  Multiple Vitamins-Minerals (MULTIVITAMINS THER. W/MINERALS) TABS Take 1 tablet by mouth daily.    Historical Provider, MD  PredniSONE 10 MG KIT 12 day dose pack po 03/20/14   [name], MD  traMADol (ULTRAM) 50 MG tablet Take 1 tablet (50 mg total) by mouth every 6 (six) hours as needed. 03/21/15   [name], PA-C   BP 155/94 mmHg  Pulse 63  Temp(Src) 97.7 F (36.5 C) (Oral)  Resp 18  Ht '6\' 1"'  (1.854 m)  Wt 155 lb (70.308 kg)  BMI 20.45 kg/m2  SpO2  100% Physical Exam  Constitutional: He is oriented to person, place, and time. He appears well-developed and well-nourished. No distress.  Nontoxic appearing.  HENT:  Head: Normocephalic and atraumatic.  Mouth/Throat: Oropharynx is clear and moist.  Eyes: Conjunctivae are normal. Pupils are equal, round, and reactive to light. Right eye exhibits no discharge. Left eye exhibits no discharge.  Neck: Normal range of motion. Neck supple. No JVD present. No tracheal deviation present.  No midline neck tenderness. The patient is able to look down with his neck and up to the ceiling. He is able to rotate his neck greater than 45 in each direction. He has tenderness over his bilateral trapezius muscles.   Cardiovascular: Normal rate, regular rhythm, normal heart sounds and intact distal pulses.  Exam reveals no gallop and no friction rub.   No murmur heard. Bilateral radial pulses are intact.  Pulmonary/Chest: Effort normal and breath sounds normal. No respiratory distress. He has no wheezes. He has no rales.  Lungs are clear to auscultation bilaterally.  Abdominal: Soft. There is no tenderness.  Musculoskeletal: Normal range of motion. He exhibits no edema or tenderness.  No lower extremity edema or tenderness. The patient is able to place his arms above his head. The patient has 5 out of 5 strength in his bilateral upper and lower extremities. He is able to ambulate without difficulty or assistance and with normal gait. No midline back tenderness. No back edema, deformity, ecchymosis or erythema noted.  Lymphadenopathy:    He has no cervical adenopathy.  Neurological: He is alert and oriented to person, place, and time. He has normal reflexes. He displays normal reflexes. Coordination normal.  The patient is alert and oriented 3. Sensation is intact bilateral upper and lower extremities. Good and equal grip strengths bilaterally. He is able to ambulatory with normal gait. Bilateral patellar DTRs are  intact.  Skin: Skin is warm and dry. No rash noted. He is not diaphoretic. No erythema. No pallor.  Psychiatric: He has a normal mood and affect. His behavior is normal.  Nursing note and vitals reviewed.   ED Course  Procedures (including critical care time) Labs Review Labs Reviewed - No data to display  Imaging Review No results found. I have personally reviewed and evaluated these images and lab results as part of my medical decision-making.   EKG Interpretation None      Filed Vitals:   03/21/15 1742 03/21/15 1742 03/21/15 1744 03/21/15 1751  BP:  142/89 155/94 155/94  Pulse: 64 64 70 63  Temp:      TempSrc:      Resp:  18  18  Height:      Weight:      SpO2: 100% 100% 99% 100%     MDM   Meds given in ED:  Medications  traMADol (ULTRAM) tablet 50 mg (50 mg Oral Given 03/21/15 1802)  methocarbamol (ROBAXIN) tablet 500 mg (500 mg Oral Given 03/21/15 1802)    Discharge Medication List as of 03/21/2015  5:49 PM  START taking these medications   Details  methocarbamol (ROBAXIN) 500 MG tablet Take 1 tablet (500 mg total) by mouth 2 (two) times daily as needed for muscle spasms., Starting 03/21/2015, Until Discontinued, Print    traMADol (ULTRAM) 50 MG tablet Take 1 tablet (50 mg total) by mouth every 6 (six) hours as needed., Starting 03/21/2015, Until Discontinued, Print        Final diagnoses:  Neck pain, bilateral  DDD (degenerative disc disease), cervical  Bilateral low back pain, with sciatica presence unspecified   Patient with bilateral low back pain and bilateral neck pain with history of cervical DDD. No new injury or trauma.  No neurological deficits and normal neuro exam.  Patient can walk without difficulty or assistance and with normal gait.  No loss of bowel or bladder control.  No concern for cauda equina.  No fever, night sweats, weight loss, h/o cancer, IVDU.  RICE protocol and pain medicine indicated and discussed with patient. Patient has had a  creatinine of 1.30 year ago. Will not give the patient any NSAIDs. Advised patient to avoid NSAIDs at this time. Advised him to have his creatinine rechecked by his primary care provider. Patient has also previously seen a sports medicine physician at [facility]. I encouraged him to follow-up with this provider. Patient given Robaxin and Ultram in the emergency department. I advised to use caution while taking these medicines as they can make him drowsy. I advised not to drive while taking these medications. I advised the patient to follow-up with their primary care provider this week. I advised the patient to return to the emergency department with new or worsening symptoms or new concerns. The patient verbalized understanding and agreement with plan.     [name], PA-C 03/21/15 1833  Dorie Rank, MD 03/22/15 [phone]

## 2015-03-21 NOTE — ED Notes (Signed)
Pt reports headache and neck pain which radiates to his back. Pt was diagnosed 2 months ago with herniated disk in his neck and back. Pt alert x 4. Pt also report blurred vision x 2 months.

## 2016-07-11 DIAGNOSIS — C649 Malignant neoplasm of unspecified kidney, except renal pelvis: Secondary | ICD-10-CM

## 2016-07-11 HISTORY — DX: Malignant neoplasm of unspecified kidney, except renal pelvis: C64.9

## 2016-08-22 ENCOUNTER — Emergency Department (HOSPITAL_COMMUNITY)
Admission: EM | Admit: 2016-08-22 | Discharge: 2016-08-22 | Discharge status: Against Medical Advice | Payer: Medicaid Other | Attending: Emergency Medicine | Admitting: Emergency Medicine

## 2016-08-22 ENCOUNTER — Emergency Department (HOSPITAL_COMMUNITY): Payer: Medicaid Other

## 2016-08-22 ENCOUNTER — Encounter (HOSPITAL_COMMUNITY): Payer: Self-pay | Admitting: Family Medicine

## 2016-08-22 DIAGNOSIS — Z7982 Long term (current) use of aspirin: Secondary | ICD-10-CM | POA: Insufficient documentation

## 2016-08-22 DIAGNOSIS — R55 Syncope and collapse: Secondary | ICD-10-CM | POA: Diagnosis present

## 2016-08-22 DIAGNOSIS — F17210 Nicotine dependence, cigarettes, uncomplicated: Secondary | ICD-10-CM | POA: Insufficient documentation

## 2016-08-22 LAB — BASIC METABOLIC PANEL
ANION GAP: 13 (ref 5–15)
BUN: 16 mg/dL (ref 6–20)
CHLORIDE: 104 mmol/L (ref 101–111)
CO2: 20 mmol/L — AB (ref 22–32)
Calcium: 9.6 mg/dL (ref 8.9–10.3)
Creatinine, Ser: 0.9 mg/dL (ref 0.61–1.24)
GFR calc non Af Amer: >60 mL/min (ref >60)
Glucose, Bld: 80 mg/dL (ref 65–99)
Potassium: 4 mmol/L (ref 3.5–5.1)
Sodium: 137 mmol/L (ref 135–145)

## 2016-08-22 LAB — URINALYSIS, ROUTINE W REFLEX MICROSCOPIC
Bilirubin Urine: NEGATIVE
Glucose, UA: NEGATIVE mg/dL
Hgb urine dipstick: NEGATIVE
Ketones, ur: NEGATIVE mg/dL
LEUKOCYTES UA: NEGATIVE
NITRITE: NEGATIVE
PH: 6 (ref 5.0–8.0)
Protein, ur: NEGATIVE mg/dL
SPECIFIC GRAVITY, URINE: 1.002 — AB (ref 1.005–1.030)

## 2016-08-22 LAB — CBC
HEMATOCRIT: 32.2 % — AB (ref 39.0–52.0)
HEMOGLOBIN: 10.4 g/dL — AB (ref 13.0–17.0)
MCH: 25.8 pg — ABNORMAL LOW (ref 26.0–34.0)
MCHC: 32.3 g/dL (ref 30.0–36.0)
MCV: 79.9 fL (ref 78.0–100.0)
Platelets: 359 10*3/uL (ref 150–400)
RBC: 4.03 MIL/uL — ABNORMAL LOW (ref 4.22–5.81)
RDW: 13.1 % (ref 11.5–15.5)
WBC: 6.5 10*3/uL (ref 4.0–10.5)

## 2016-08-22 LAB — CBG MONITORING, ED: Glucose-Capillary: 86 mg/dL (ref 65–99)

## 2016-08-22 LAB — TROPONIN I

## 2016-08-22 MED ORDER — SODIUM CHLORIDE 0.9 % IV BOLUS (SEPSIS)
1000.0000 mL | Freq: Once | INTRAVENOUS | Status: AC
  Administered 2016-08-22: 1000 mL via INTRAVENOUS

## 2016-08-22 MED ORDER — KETOROLAC TROMETHAMINE 30 MG/ML IJ SOLN
30.0000 mg | Freq: Once | INTRAMUSCULAR | Status: AC
  Administered 2016-08-22: 30 mg via INTRAVENOUS
  Filled 2016-08-22: qty 1

## 2016-08-22 MED ORDER — METHOCARBAMOL 500 MG PO TABS
1000.0000 mg | ORAL_TABLET | Freq: Once | ORAL | Status: AC
  Administered 2016-08-22: 1000 mg via ORAL
  Filled 2016-08-22: qty 2

## 2016-08-22 MED ORDER — METOCLOPRAMIDE HCL 5 MG/ML IJ SOLN
10.0000 mg | Freq: Once | INTRAMUSCULAR | Status: AC
  Administered 2016-08-22: 10 mg via INTRAVENOUS
  Filled 2016-08-22: qty 2

## 2016-08-22 NOTE — ED Triage Notes (Signed)
Pt presents via POV with c/o syncopal episode today and twice last week after drinking alcohol. Pt denies hx of similar incidents. Pt denies injury from these LOC episodes, but endorses chronic neck, back, and bilat hip pain. He is ambulatory in triage, A&Ox4 and in NAD.

## 2016-08-22 NOTE — ED Provider Notes (Signed)
[facility] DEPT Provider Note   CSN: [id number] Arrival date & time: 08/22/16  1423     History   Chief Complaint Chief Complaint  Patient presents with  . Loss of Consciousness    HPI Patrick Romero is a 56 y.o. male.  HPI   Patrick Romero is a 56 y.o. male, with a history of Herniated cervical and lumbar discs and chronic neck, hip, and back pain, presenting to the ED with a syncopal episode that occurred about 4 hours prior to arrival. Patient states that he was walking in his living room when he "started to see stars," and then lost consciousness. Patient states that his wife told him that he was unconscious for about 2 minutes. She reported that he was shaking and she thought he was having a seizure. Patient reports about 5 similar syncopal episodes over the past month, all in different circumstances. Patient endorses having a headache for the past 5 days. States it is different than previous headaches. Headache is generalized, bilateral, throbbing, moderate to severe, and radiates into the neck and shoulders. Patient admits to almost daily alcohol use with at least a sixpack of beer each day.  Patient denies urinary or bowel incontinence, chest pain, shortness of breath, N/V/D, or any other complaints.      Past Medical History:  Diagnosis Date  . Herniated disc, cervical     There are no active problems to display for this patient.   History reviewed. No pertinent surgical history.     Home Medications    Prior to Admission medications   Medication Sig Start Date End Date Taking? Authorizing Provider  acetaminophen (TYLENOL) 500 MG tablet Take 1,000 mg by mouth every 6 (six) hours as needed for mild pain.    Historical Provider, MD  Aspirin-Salicylamide-Caffeine (BC HEADACHE POWDER PO) Take 2 packets by mouth every 4 (four) hours as needed. For pain    Historical Provider, MD  cyclobenzaprine (FLEXERIL) 5 MG tablet Take 1 tablet (5 mg total) by mouth at  bedtime as needed for muscle spasms. 03/20/14   [name], MD  ibuprofen (ADVIL,MOTRIN) 200 MG tablet Take 400 mg by mouth every 6 (six) hours as needed. For pain    Historical Provider, MD  methocarbamol (ROBAXIN) 500 MG tablet Take 1 tablet (500 mg total) by mouth 2 (two) times daily as needed for muscle spasms. 03/21/15   [name], PA-C  Multiple Vitamins-Minerals (MULTIVITAMINS THER. W/MINERALS) TABS Take 1 tablet by mouth daily.    Historical Provider, MD  PredniSONE 10 MG KIT 12 day dose pack po 03/20/14   [name], MD  traMADol (ULTRAM) 50 MG tablet Take 1 tablet (50 mg total) by mouth every 6 (six) hours as needed. 03/21/15   [name], PA-C    Family History No family history on file.  Social History Social History  Substance Use Topics  . Smoking status: Current Every Day Smoker    Packs/day: 0.25    Types: Cigarettes  . Smokeless tobacco: Never Used  . Alcohol use Yes     Comment: 2-40oz beers/day     Allergies   Patient has no known allergies.   Review of Systems Review of Systems  Constitutional: Negative for chills and fever.  Respiratory: Negative for cough and shortness of breath.   Cardiovascular: Negative for chest pain.  Gastrointestinal: Negative for abdominal pain, blood in stool, diarrhea, nausea and vomiting.  Musculoskeletal: Positive for neck pain.  Skin: Negative for wound.  Neurological: Positive for syncope and headaches. Negative for dizziness, weakness, light-headedness and numbness.  All other systems reviewed and are negative.    Physical Exam Updated Vital Signs BP 116/77 (BP Location: Left Arm)   Pulse 94   Temp 98.3 F (36.8 C) (Oral)   Resp 16   Ht 6' (1.829 m)   Wt 67.6 kg   SpO2 99%   BMI 20.21 kg/m   Physical Exam  Constitutional: He appears well-developed and well-nourished. No distress.  HENT:  Head: Normocephalic and atraumatic.  Eyes: Conjunctivae are normal.  Neck: Neck supple.  Cardiovascular: Normal  rate, regular rhythm, normal heart sounds and intact distal pulses.   Pulmonary/Chest: Effort normal and breath sounds normal. No respiratory distress.  Abdominal: Soft. There is no tenderness. There is no guarding.  Musculoskeletal: He exhibits no edema.  Tenderness to the bilateral cervical musculature into the bilateral trapezius muscles. Full range of motion intact in the upper and lower extremities.  Lymphadenopathy:    He has no cervical adenopathy.  Neurological: He is alert.  Skin: Skin is warm and dry. He is not diaphoretic.  Psychiatric: He has a normal mood and affect. His behavior is normal.  Nursing note and vitals reviewed.    ED Treatments / Results  Labs (all labs ordered are listed, but only abnormal results are displayed) Labs Reviewed  BASIC METABOLIC PANEL - Abnormal; Notable for the following:       Result Value   CO2 20 (*)    All other components within normal limits  CBC - Abnormal; Notable for the following:    RBC 4.03 (*)    Hemoglobin 10.4 (*)    HCT 32.2 (*)    MCH 25.8 (*)    All other components within normal limits  URINALYSIS, ROUTINE W REFLEX MICROSCOPIC - Abnormal; Notable for the following:    Color, Urine COLORLESS (*)    Specific Gravity, Urine 1.002 (*)    All other components within normal limits  TROPONIN I  ETHANOL  CBG MONITORING, ED   Ethanol level pending upon admission.  EKG  EKG Interpretation  Date/Time:  Monday August 22 2016 15:34:38 EST Ventricular Rate:  92 PR Interval:  162 QRS Duration: 80 QT Interval:  354 QTC Calculation: 437 R Axis:   77 Text Interpretation:  Normal sinus rhythm Possible Left atrial enlargement Borderline ECG Confirmed by Zenia Resides  MD, ANTHONY ([id number]) on 08/22/2016 8:02:00 PM       Radiology Dg Chest 2 View  Result Date: 08/22/2016 CLINICAL DATA:  Acute onset of syncope. Chronic neck and back pain. Initial encounter. EXAM: CHEST  2 VIEW COMPARISON:  Chest radiograph performed 08/23/2013  FINDINGS: The lungs are well-aerated and clear. There is no evidence of focal opacification, pleural effusion or pneumothorax. The heart is normal in size; the mediastinal contour is within normal limits. No acute osseous abnormalities are seen. IMPRESSION: No acute cardiopulmonary process seen. Electronically Signed   By: Garald Balding M.D.   On: 08/22/2016 19:41   Ct Head Wo Contrast  Result Date: 08/22/2016 CLINICAL DATA:  Possible seizure activity, with loss of consciousness. Concern for head or cervical spine injury. Initial encounter. EXAM: CT HEAD WITHOUT CONTRAST CT CERVICAL SPINE WITHOUT CONTRAST TECHNIQUE: Multidetector CT imaging of the head and cervical spine was performed following the standard protocol without intravenous contrast. Multiplanar CT image reconstructions of the cervical spine were also generated. COMPARISON:  Cervical spine radiographs performed 03/20/2014 FINDINGS: CT HEAD FINDINGS Brain: No evidence of  acute infarction, hemorrhage, hydrocephalus, extra-axial collection or mass lesion/mass effect. Prominence of the sulci suggests mild cortical volume loss. Mild cerebellar atrophy is noted. The brainstem and fourth ventricle are within normal limits. The basal ganglia are unremarkable in appearance. The cerebral hemispheres demonstrate grossly normal gray-white differentiation. No mass effect or midline shift is seen. Vascular: No hyperdense vessel or unexpected calcification. Skull: There is no evidence of fracture; visualized osseous structures are unremarkable in appearance. Sinuses/Orbits: The orbits are within normal limits. The paranasal sinuses and mastoid air cells are well-aerated. Other: No significant soft tissue abnormalities are seen. CT CERVICAL SPINE FINDINGS Alignment: Normal. Mild reversal of the normal lordotic curvature of the cervical spine appears to be degenerative in nature. Skull base and vertebrae: No acute fracture. No primary bone lesion or focal pathologic  process. Soft tissues and spinal canal: No prevertebral fluid or swelling. No visible canal hematoma. Disc levels: Minimal disc space narrowing is noted at C6-C7, with scattered anterior and posterior disc osteophyte complexes along the cervical spine. Upper chest: A small hypodensity at the right thyroid lobe is likely benign in nature, given its size. The visualized lung apices are clear. Other: No additional soft tissue abnormalities are seen. IMPRESSION: 1. No evidence of traumatic intracranial injury or fracture. 2. No evidence of fracture or subluxation along the cervical spine. 3. Mild cortical volume loss noted. 4. Minimal degenerative change along the lower cervical spine. Electronically Signed   By: Garald Balding M.D.   On: 08/22/2016 19:22   Ct Cervical Spine Wo Contrast  Result Date: 08/22/2016 CLINICAL DATA:  Possible seizure activity, with loss of consciousness. Concern for head or cervical spine injury. Initial encounter. EXAM: CT HEAD WITHOUT CONTRAST CT CERVICAL SPINE WITHOUT CONTRAST TECHNIQUE: Multidetector CT imaging of the head and cervical spine was performed following the standard protocol without intravenous contrast. Multiplanar CT image reconstructions of the cervical spine were also generated. COMPARISON:  Cervical spine radiographs performed 03/20/2014 FINDINGS: CT HEAD FINDINGS Brain: No evidence of acute infarction, hemorrhage, hydrocephalus, extra-axial collection or mass lesion/mass effect. Prominence of the sulci suggests mild cortical volume loss. Mild cerebellar atrophy is noted. The brainstem and fourth ventricle are within normal limits. The basal ganglia are unremarkable in appearance. The cerebral hemispheres demonstrate grossly normal gray-white differentiation. No mass effect or midline shift is seen. Vascular: No hyperdense vessel or unexpected calcification. Skull: There is no evidence of fracture; visualized osseous structures are unremarkable in appearance.  Sinuses/Orbits: The orbits are within normal limits. The paranasal sinuses and mastoid air cells are well-aerated. Other: No significant soft tissue abnormalities are seen. CT CERVICAL SPINE FINDINGS Alignment: Normal. Mild reversal of the normal lordotic curvature of the cervical spine appears to be degenerative in nature. Skull base and vertebrae: No acute fracture. No primary bone lesion or focal pathologic process. Soft tissues and spinal canal: No prevertebral fluid or swelling. No visible canal hematoma. Disc levels: Minimal disc space narrowing is noted at C6-C7, with scattered anterior and posterior disc osteophyte complexes along the cervical spine. Upper chest: A small hypodensity at the right thyroid lobe is likely benign in nature, given its size. The visualized lung apices are clear. Other: No additional soft tissue abnormalities are seen. IMPRESSION: 1. No evidence of traumatic intracranial injury or fracture. 2. No evidence of fracture or subluxation along the cervical spine. 3. Mild cortical volume loss noted. 4. Minimal degenerative change along the lower cervical spine. Electronically Signed   By: Garald Balding M.D.   On: 08/22/2016  19:22    Procedures Procedures (including critical care time)  Medications Ordered in ED Medications  sodium chloride 0.9 % bolus 1,000 mL (0 mLs Intravenous Stopped 08/22/16 2039)  ketorolac (TORADOL) 30 MG/ML injection 30 mg (30 mg Intravenous Given 08/22/16 2006)  metoCLOPramide (REGLAN) injection 10 mg (10 mg Intravenous Given 08/22/16 2006)  methocarbamol (ROBAXIN) tablet 1,000 mg (1,000 mg Oral Given 08/22/16 2006)     Initial Impression / Assessment and Plan / ED Course  I have reviewed the triage vital signs and the nursing notes.  Pertinent labs & imaging results that were available during my care of the patient were reviewed by me and considered in my medical decision making (see chart for details).     Patient presents for evaluation  following a reported syncopal event.  Patient voices improvement in his headache following IV fluids. Patient's anemia is noted. He states that he has a history of the same and states that a hemoglobin of 10-11 sounds normal to him. He denies hematochezia, melena, or other bleeding. Patient does not appear intoxicated clinically. Patient's other lab results are reassuring, however, observation admission is recommended due to his syncope. This plan of care was discussed with the patient and he agrees to the plan. 8:14 PM Spoke with Dr. Tamala Julian, hospitalist, who agreed to admit the patient for observation and continued syncope work up.   8:32 PM Pt is now saying that he does not want to be admitted. This occurred prior to Dr. Tamala Julian having a chance to evaluate or interview the patient. He states, "I think I will be ok. I will definitely follow up. I understand the concerns you have, but I also have to take care of my wife." I discussed other options with the pt, including following up with a PCP or returning to the ED at a later time. The possible dangers of leaving were also discussed. He voices understanding. He knows he is leaving Against Medical Advice, but also states that he knows he can come back anytime.   Vitals:   08/22/16 1515 08/22/16 1516 08/22/16 2026  BP: 116/77    Pulse: 94  94  Resp: 16  16  Temp: 98.3 F (36.8 C)    TempSrc: Oral    SpO2: 99%  99%  Weight:  67.6 kg   Height:  6' (1.829 m)    Orthostatic VS for the past 24 hrs:  BP- Lying Pulse- Lying BP- Sitting Pulse- Sitting BP- Standing at 0 minutes Pulse- Standing at 0 minutes  08/22/16 2025 120/77 79 123/73 85 (!) 133/98 86      Final Clinical Impressions(s) / ED Diagnoses   Final diagnoses:  Syncope, unspecified syncope type    New Prescriptions New Prescriptions   No medications on file     Lorayne Bender, PA-C 08/22/16 2020    Lorayne Bender, PA-C 08/22/16 2046    Lacretia Leigh, MD 08/25/16 1444

## 2016-08-22 NOTE — Discharge Instructions (Signed)
You have been seen today for a syncopal episode. It was recommended that you be admitted to the hospital for further evaluation and observation. You have chosen to leave Against Medical Advice. Please know that you are welcome and encouraged to return to the ED should you change your mind. Regardless, you should follow up with a physician or other provider on this issue as soon as possible.

## 2016-08-22 NOTE — ED Notes (Signed)
Pt does not want to be admitted stated he has to get home and go to work. Pt signed AMA paperwork and was given instructions for the risks for leaving, Pt understands and is stable for discharge

## 2016-08-31 ENCOUNTER — Encounter (HOSPITAL_COMMUNITY): Payer: Self-pay | Admitting: Emergency Medicine

## 2016-08-31 ENCOUNTER — Inpatient Hospital Stay (HOSPITAL_COMMUNITY)
Admission: EM | Admit: 2016-08-31 | Discharge: 2016-09-05 | DRG: 552 | Disposition: A | Discharge status: Rehabilitation Facility | Payer: Medicaid Other | Attending: Surgery | Admitting: Surgery

## 2016-08-31 ENCOUNTER — Emergency Department (HOSPITAL_COMMUNITY): Payer: Medicaid Other

## 2016-08-31 DIAGNOSIS — Z720 Tobacco use: Secondary | ICD-10-CM

## 2016-08-31 DIAGNOSIS — S12200A Unspecified displaced fracture of third cervical vertebra, initial encounter for closed fracture: Principal | ICD-10-CM | POA: Diagnosis present

## 2016-08-31 DIAGNOSIS — Z79899 Other long term (current) drug therapy: Secondary | ICD-10-CM

## 2016-08-31 DIAGNOSIS — G8929 Other chronic pain: Secondary | ICD-10-CM | POA: Diagnosis present

## 2016-08-31 DIAGNOSIS — S14129A Central cord syndrome at unspecified level of cervical spinal cord, initial encounter: Secondary | ICD-10-CM

## 2016-08-31 DIAGNOSIS — N281 Cyst of kidney, acquired: Secondary | ICD-10-CM

## 2016-08-31 DIAGNOSIS — G8250 Quadriplegia, unspecified: Secondary | ICD-10-CM

## 2016-08-31 DIAGNOSIS — Y908 Blood alcohol level of 240 mg/100 ml or more: Secondary | ICD-10-CM | POA: Diagnosis present

## 2016-08-31 DIAGNOSIS — M545 Low back pain: Secondary | ICD-10-CM | POA: Diagnosis present

## 2016-08-31 DIAGNOSIS — F10920 Alcohol use, unspecified with intoxication, uncomplicated: Secondary | ICD-10-CM

## 2016-08-31 DIAGNOSIS — F10129 Alcohol abuse with intoxication, unspecified: Secondary | ICD-10-CM | POA: Diagnosis present

## 2016-08-31 DIAGNOSIS — N179 Acute kidney failure, unspecified: Secondary | ICD-10-CM

## 2016-08-31 DIAGNOSIS — F17210 Nicotine dependence, cigarettes, uncomplicated: Secondary | ICD-10-CM | POA: Diagnosis present

## 2016-08-31 DIAGNOSIS — F10929 Alcohol use, unspecified with intoxication, unspecified: Secondary | ICD-10-CM

## 2016-08-31 DIAGNOSIS — D649 Anemia, unspecified: Secondary | ICD-10-CM

## 2016-08-31 DIAGNOSIS — M549 Dorsalgia, unspecified: Secondary | ICD-10-CM

## 2016-08-31 DIAGNOSIS — R0789 Other chest pain: Secondary | ICD-10-CM

## 2016-08-31 DIAGNOSIS — S129XXA Fracture of neck, unspecified, initial encounter: Secondary | ICD-10-CM

## 2016-08-31 DIAGNOSIS — M542 Cervicalgia: Secondary | ICD-10-CM

## 2016-08-31 DIAGNOSIS — F1010 Alcohol abuse, uncomplicated: Secondary | ICD-10-CM

## 2016-08-31 DIAGNOSIS — G839 Paralytic syndrome, unspecified: Secondary | ICD-10-CM | POA: Diagnosis present

## 2016-08-31 DIAGNOSIS — G8220 Paraplegia, unspecified: Secondary | ICD-10-CM

## 2016-08-31 DIAGNOSIS — S12600A Unspecified displaced fracture of seventh cervical vertebra, initial encounter for closed fracture: Secondary | ICD-10-CM | POA: Diagnosis present

## 2016-08-31 DIAGNOSIS — M792 Neuralgia and neuritis, unspecified: Secondary | ICD-10-CM

## 2016-08-31 DIAGNOSIS — R531 Weakness: Secondary | ICD-10-CM | POA: Diagnosis present

## 2016-08-31 DIAGNOSIS — X58XXXA Exposure to other specified factors, initial encounter: Secondary | ICD-10-CM

## 2016-08-31 HISTORY — DX: Cervicalgia: M54.2

## 2016-08-31 HISTORY — DX: Dorsalgia, unspecified: M54.9

## 2016-08-31 HISTORY — DX: Other chronic pain: G89.29

## 2016-08-31 LAB — COMPREHENSIVE METABOLIC PANEL
ALK PHOS: 51 U/L (ref 38–126)
ALT: 17 U/L (ref 17–63)
AST: 31 U/L (ref 15–41)
Albumin: 3.8 g/dL (ref 3.5–5.0)
Anion gap: 8 (ref 5–15)
BUN: 25 mg/dL — ABNORMAL HIGH (ref 6–20)
CALCIUM: 8.7 mg/dL — AB (ref 8.9–10.3)
CO2: 22 mmol/L (ref 22–32)
CREATININE: 1.04 mg/dL (ref 0.61–1.24)
Chloride: 108 mmol/L (ref 101–111)
Glucose, Bld: 86 mg/dL (ref 65–99)
Potassium: 3.5 mmol/L (ref 3.5–5.1)
Sodium: 138 mmol/L (ref 135–145)
Total Bilirubin: 0.4 mg/dL (ref 0.3–1.2)
Total Protein: 7.3 g/dL (ref 6.5–8.1)

## 2016-08-31 LAB — DIFFERENTIAL
Basophils Absolute: 0 10*3/uL (ref 0.0–0.1)
Basophils Relative: 0 %
Eosinophils Absolute: 0.2 10*3/uL (ref 0.0–0.7)
Eosinophils Relative: 2 %
LYMPHS ABS: 2.9 10*3/uL (ref 0.7–4.0)
Lymphocytes Relative: 30 %
MONO ABS: 0.5 10*3/uL (ref 0.1–1.0)
MONOS PCT: 5 %
NEUTROS ABS: 6.2 10*3/uL (ref 1.7–7.7)
Neutrophils Relative %: 63 %

## 2016-08-31 LAB — CBC
HCT: 25.9 % — ABNORMAL LOW (ref 39.0–52.0)
Hemoglobin: 8.4 g/dL — ABNORMAL LOW (ref 13.0–17.0)
MCH: 25.5 pg — ABNORMAL LOW (ref 26.0–34.0)
MCHC: 32.4 g/dL (ref 30.0–36.0)
MCV: 78.7 fL (ref 78.0–100.0)
PLATELETS: 279 10*3/uL (ref 150–400)
RBC: 3.29 MIL/uL — ABNORMAL LOW (ref 4.22–5.81)
RDW: 13.4 % (ref 11.5–15.5)
WBC: 9.9 10*3/uL (ref 4.0–10.5)

## 2016-08-31 LAB — I-STAT CHEM 8, ED
BUN: 25 mg/dL — ABNORMAL HIGH (ref 6–20)
CHLORIDE: 105 mmol/L (ref 101–111)
CREATININE: 1.4 mg/dL — AB (ref 0.61–1.24)
Calcium, Ion: 1.1 mmol/L — ABNORMAL LOW (ref 1.15–1.40)
GLUCOSE: 83 mg/dL (ref 65–99)
HCT: 31 % — ABNORMAL LOW (ref 39.0–52.0)
Hemoglobin: 10.5 g/dL — ABNORMAL LOW (ref 13.0–17.0)
POTASSIUM: 3.6 mmol/L (ref 3.5–5.1)
Sodium: 141 mmol/L (ref 135–145)
TCO2: 23 mmol/L (ref 0–100)

## 2016-08-31 LAB — PROTIME-INR
INR: 0.98
Prothrombin Time: 13 seconds (ref 11.4–15.2)

## 2016-08-31 LAB — APTT: APTT: 28 s (ref 24–36)

## 2016-08-31 LAB — I-STAT TROPONIN, ED: TROPONIN I, POC: 0 ng/mL (ref 0.00–0.08)

## 2016-08-31 LAB — ETHANOL: ALCOHOL ETHYL (B): 324 mg/dL — AB (ref <5)

## 2016-08-31 MED ORDER — SODIUM CHLORIDE 0.9 % IJ SOLN
INTRAMUSCULAR | Status: AC
  Filled 2016-08-31: qty 50

## 2016-08-31 MED ORDER — IOPAMIDOL (ISOVUE-370) INJECTION 76%
100.0000 mL | Freq: Once | INTRAVENOUS | Status: AC | PRN
  Administered 2016-08-31: 100 mL via INTRAVENOUS

## 2016-08-31 MED ORDER — DEXAMETHASONE SODIUM PHOSPHATE 10 MG/ML IJ SOLN
10.0000 mg | Freq: Once | INTRAMUSCULAR | Status: AC
Start: 2016-08-31 — End: 2016-08-31
  Administered 2016-08-31: 10 mg via INTRAVENOUS
  Filled 2016-08-31: qty 1

## 2016-08-31 MED ORDER — SODIUM CHLORIDE 0.9 % IV BOLUS (SEPSIS)
1000.0000 mL | Freq: Once | INTRAVENOUS | Status: AC
  Administered 2016-08-31: 1000 mL via INTRAVENOUS

## 2016-08-31 MED ORDER — IOPAMIDOL (ISOVUE-370) INJECTION 76%
INTRAVENOUS | Status: AC
  Filled 2016-08-31: qty 100

## 2016-08-31 NOTE — ED Provider Notes (Signed)
[facility] DEPT Provider Note   CSN: [id number] Arrival date & time: 08/31/16  2217  By signing my name below, I, Patrick Romero, attest that this documentation has been prepared under the direction and in the presence of non-physician practitioner, Antonietta Breach, PA-C. Electronically Signed: [name], Scribe. 08/31/2016. 11:09 PM.   History   Chief Complaint Chief Complaint  Patient presents with  . Altered Mental Status   The history is provided by the patient and the EMS personnel. No language interpreter was used.     LEVEL 5 CAVEAT DUE TO ALTERED MENTAL STATUS   HPI Comments:  Patrick Romero is a 56 y.o. male brought in by ambulance, who presents to the Emergency Department after presumed fall. Unknown down time. Pt was found by EMS on the ground behind [employer]. Pt appears smells of alcohol, though he denies being intoxicated. Per EMS, pt is complaining of neck, back, and leg pain. He states that he cannot move his arms.   Past Medical History:  Diagnosis Date  . Herniated disc, cervical     Patient Active Problem List   Diagnosis Date Noted  . Chronic neck and back pain 09/01/2016  . Alcohol intoxication ([place]) 09/01/2016  . Tobacco abuse 09/01/2016  . 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 No pertinent surgical history.    Home Medications    Prior to Admission medications   Medication Sig Start Date End Date Taking? Authorizing Provider  acetaminophen (TYLENOL) 500 MG tablet Take 1,000 mg by mouth every 6 (six) hours as needed for mild pain.    Historical Provider, MD  Aspirin-Salicylamide-Caffeine (BC HEADACHE POWDER PO) Take 2 packets by mouth every 4 (four) hours as needed. For pain    Historical Provider, MD  cyclobenzaprine (FLEXERIL) 5 MG tablet Take 1 tablet (5 mg total) by mouth at bedtime as needed for muscle spasms. 03/20/14   [name], MD  ibuprofen (ADVIL,MOTRIN) 200 MG tablet Take 400 mg by mouth  every 6 (six) hours as needed. For pain    Historical Provider, MD  methocarbamol (ROBAXIN) 500 MG tablet Take 1 tablet (500 mg total) by mouth 2 (two) times daily as needed for muscle spasms. 03/21/15   [name], PA-C  Multiple Vitamins-Minerals (MULTIVITAMINS THER. W/MINERALS) TABS Take 1 tablet by mouth daily.    Historical Provider, MD  PredniSONE 10 MG KIT 12 day dose pack po 03/20/14   [name], MD  traMADol (ULTRAM) 50 MG tablet Take 1 tablet (50 mg total) by mouth every 6 (six) hours as needed. 03/21/15   [name], PA-C    Family History History reviewed. No pertinent family history.  Social History Social History  Substance Use Topics  . Smoking status: Current Every Day Smoker    Packs/day: 0.25    Types: Cigarettes  . Smokeless tobacco: Never Used  . Alcohol use Yes     Comment: 2-40oz beers/day     Allergies   Patient has no known allergies.   Review of Systems Review of Systems  Unable to perform ROS: Mental status change  Patient intoxicated   Physical Exam Updated Vital Signs BP 99/66 (BP Location: Left Arm)   Pulse 66   Resp 18   SpO2 97%   Physical Exam  Constitutional: He appears well-developed and well-nourished.  Disheveled appearance; dirty. Smells of ETOH.  HENT:  Head: Normocephalic and atraumatic.  No battle's sign or raccoon's eyes  Eyes: Conjunctivae and EOM  are normal. Pupils are equal, round, and reactive to light. No scleral icterus.  Neck:  C-collar in place  Cardiovascular: Normal rate, regular rhythm and intact distal pulses.   Pulmonary/Chest: Effort normal. No respiratory distress. He has no wheezes.  Lungs CTAB. Chest expansion symmetric.  Neurological: He is alert. No cranial nerve deficit. GCS eye subscore is 4. GCS verbal subscore is 5. GCS motor subscore is 6.  Patient answers questions appropriately and follows commands. He has 1/5 strength in the RUE and 2-3/5 strength in the LUE. Will move BLE minimally.  Reports subjective decreased sensation below the mid thorax. Does have sensation to sharp touch to face, anterior neck, and upper chest.  Skin: Skin is warm and dry. No rash noted. No erythema. No pallor.  Psychiatric: He has a normal mood and affect. His behavior is normal. His speech is slurred.  Nursing note and vitals reviewed.    ED Treatments / Results  DIAGNOSTIC STUDIES:  Oxygen Saturation is 97% on RA, normal by my interpretation.    Labs (all labs ordered are listed, but only abnormal results are displayed) Labs Reviewed  ETHANOL - Abnormal; Notable for the following:       Result Value   Alcohol, Ethyl (B) 324 (*)    All other components within normal limits  CBC - Abnormal; Notable for the following:    RBC 3.29 (*)    Hemoglobin 8.4 (*)    HCT 25.9 (*)    MCH 25.5 (*)    All other components within normal limits  COMPREHENSIVE METABOLIC PANEL - Abnormal; Notable for the following:    BUN 25 (*)    Calcium 8.7 (*)    All other components within normal limits  I-STAT CHEM 8, ED - Abnormal; Notable for the following:    BUN 25 (*)    Creatinine, Ser 1.40 (*)    Calcium, Ion 1.10 (*)    Hemoglobin 10.5 (*)    HCT 31.0 (*)    All other components within normal limits  PROTIME-INR  APTT  DIFFERENTIAL  RAPID URINE DRUG SCREEN, HOSP PERFORMED  URINALYSIS, ROUTINE W REFLEX MICROSCOPIC  HIV ANTIBODY (ROUTINE TESTING)  CBC  BASIC METABOLIC PANEL  I-STAT TROPOININ, ED    EKG  EKG Interpretation  Date/Time:  Wednesday August 31 2016 23:02:28 EST Ventricular Rate:  69 PR Interval:    QRS Duration: 84 QT Interval:  443 QTC Calculation: 475 R Axis:   74 Text Interpretation:  Sinus rhythm Anteroseptal infarct, old Since last tracing rate slower Otherwise no significant change Confirmed by KNOTT MD, DANIEL [phone]) on 08/31/2016 11:39:35 PM       Radiology Dg Chest 1 View  Result Date: 08/31/2016 CLINICAL DATA:  56 year old male with fall. EXAM: CHEST 1  VIEW COMPARISON:  Chest radiograph dated 08/22/2016 FINDINGS: The lungs are clear. There is no pleural effusion or pneumothorax. The cardiac silhouette is within normal limits. Mild tortuosity of the thoracic aorta. No acute osseous pathology. IMPRESSION: No active disease. Electronically Signed   By: Anner Crete M.D.   On: 08/31/2016 22:52   Dg Pelvis 1-2 Views  Result Date: 08/31/2016 CLINICAL DATA:  56 year old male with fall. EXAM: PELVIS - 1-2 VIEW COMPARISON:  Lumbar spine radiograph dated 03/20/2014 FINDINGS: There is no acute fracture or dislocation. Mild osteopenia. No arthritic changes. The soft tissues appear unremarkable. IMPRESSION: No acute/traumatic osseous pathology. Electronically Signed   By: Anner Crete M.D.   On: 08/31/2016 22:53   Ct Head  Wo Contrast  Result Date: 08/31/2016 CLINICAL DATA:  56 y/o M; found lying on round, altered mental status, intoxication, right arm weakness. EXAM: CT HEAD WITHOUT CONTRAST CT CERVICAL SPINE WITHOUT CONTRAST TECHNIQUE: Multidetector CT imaging of the head and cervical spine was performed following the standard protocol without intravenous contrast. Multiplanar CT image reconstructions of the cervical spine were also generated. COMPARISON:  08/22/2016 CT head and cervical spine. FINDINGS: CT HEAD FINDINGS Brain: No evidence of acute infarction, hemorrhage, hydrocephalus, extra-axial collection or significant mass effect. Stable 7 mm dense focus along the right tentorium, probably a meningioma (series 9, image 44). Vascular: No hyperdense vessel or unexpected calcification. Skull: Normal. Negative for fracture or focal lesion. Sinuses/Orbits: No acute finding. Other: None. CT CERVICAL SPINE FINDINGS Alignment: Normal. Skull base and vertebrae: Nondisplaced acute fracture through the right C3 foramen transversarium (series 7, image 35). Nondisplaced acute fracture of the right inferior articular facet of the C7 vertebral body (series 12, image  25). Acute fracture through the anterior margin of the superior endplate of the C7 vertebral body (series 13, image 31). Anterior displacement of a bridging osteophyte between the C3 and C4 vertebral bodies and prevertebral edema from C2 -C5 may represent nondisplaced fracture through the C3-4 disc (series image 33). No loss of vertebral body height.  No dislocation. Soft tissues and spinal canal: 7 mm nodule within the right lobe of thyroid. No canal hematoma identified. Disc levels: Stable cervical spondylosis without high-grade bony canal stenosis. Upper chest: Negative. Other: Negative. IMPRESSION: 1. Nondisplaced acute fracture through the right C3 foramen transversarium. CT angiogram of the neck is recommended to evaluate for vascular injury. 2. Nondisplaced acute fracture of the right inferior articular facet of the C7 vertebral body. 3. Minimally displaced acute fracture through the anterior margin of the superior endplate of the C7 vertebral body. 4. Anterior displacement of a bridging osteophyte between the C3 and C4 vertebral bodies and prevertebral edema from C2 -C5 may represent fracture through the C3-4 disc. 5. No dislocation or malalignment of the cervical spine. 6. No acute intracranial abnormality. These results were called by telephone at the time of interpretation on 08/31/2016 at 11:04 pm to Dr. Leo Grosser , who verbally acknowledged these results. Electronically Signed   By: Kristine Garbe M.D.   On: 08/31/2016 23:04   Ct Angio Neck W And/or Wo Contrast  Result Date: 09/01/2016 CLINICAL DATA:  Cervical spine fractures after fall. EXAM: CT ANGIOGRAPHY NECK TECHNIQUE: Multidetector CT imaging of the neck was performed using the standard protocol during bolus administration of intravenous contrast. Multiplanar CT image reconstructions and MIPs were obtained to evaluate the vascular anatomy. Carotid stenosis measurements (when applicable) are obtained utilizing NASCET criteria, using  the distal internal carotid diameter as the denominator. CONTRAST:  100 mL Isovue 370 IV COMPARISON:  Cervical spine CT 08/31/2016 FINDINGS: Aortic arch: Mild atherosclerotic calcification of the aortic arch. There is a normal 3 vessel branching pattern. The visualized proximal subclavian arteries are normal. No dissection or aneurysm. Central pulmonary arteries are normal. Right carotid system: There is minimal calcified plaque of the proximal internal carotid artery without hemodynamically significant stenosis. No dissection or other acute injury. Left carotid system: Minimal atherosclerotic plaque at the carotid bifurcation proximal left internal carotid artery without hemodynamically significant stenosis. No dissection or other acute abnormality. Vertebral arteries: Both vertebral artery origins are widely patent. The left vertebral artery is dominant. There is very mild short segment narrowing of the right vertebral artery at the level of  the right transverse foramen fracture of C3. This is best demonstrated on series 12, image 66 and series 8 images 203-211. The remainder of the artery is normal. The left vertebral artery is normal. Skeleton: Fractures of the right C3 transverse foramen, C7 right inferior facet and anterior superior C7 corner are again noted. No static subluxation. Other neck: The nasopharynx is clear. The oropharynx and hypopharynx are normal. The epiglottis is normal. The supraglottic larynx, glottis and subglottic larynx are normal. No retropharyngeal collection. The parapharyngeal spaces are preserved. The parotid and submandibular glands are normal. No sialolithiasis or salivary ductal dilatation. The thyroid gland is normal. There is no cervical lymphadenopathy. Upper chest: Clear IMPRESSION: 1. Minimal luminal narrowing of the right vertebral artery at the level of the right C3 transverse foramen fracture may indicate a grade 1 intimal injury. 2. Otherwise normal CTA of the neck. 3.  Multiple cervical spine fractures, as previously characterized on concomitant cervical spine CT. Electronically Signed   By: Ulyses Jarred M.D.   On: 09/01/2016 00:31   Ct Chest W Contrast  Result Date: 09/01/2016 CLINICAL DATA:  56 year old male with fall. Patient complaining of neck and back and leg pain. EXAM: CT CHEST, ABDOMEN, AND PELVIS WITH CONTRAST TECHNIQUE: Multidetector CT imaging of the chest, abdomen and pelvis was performed following the standard protocol during bolus administration of intravenous contrast. CONTRAST:  100 cc Isovue 370 COMPARISON:  chest and pelvic radiographs dated 08/31/2016 FINDINGS: CT CHEST FINDINGS Cardiovascular: There is no cardiomegaly or pericardial effusion. There is mild atherosclerotic calcification of the thoracic aorta. No aneurysmal dilatation or evidence of dissection. The visualized origins of the great vessels of the aortic arch appear patent. The central pulmonary arteries are patent as well. Mediastinum/Nodes: There is no hilar or mediastinal adenopathy. Mild thickened appearance of the esophagus likely related to underdistention. The thyroid gland is grossly unremarkable. Lungs/Pleura: Minimal bibasilar dependent atelectatic changes of the lungs. There is no focal consolidation, pleural effusion, or pneumothorax. The central airways are patent. Musculoskeletal: There is no axillary adenopathy. The chest wall soft tissues appear unremarkable. No acute fracture. CT ABDOMEN PELVIS FINDINGS No intra-abdominal free air or free fluid. Hepatobiliary: No focal liver abnormality is seen. No gallstones, gallbladder wall thickening, or biliary dilatation. Pancreas: Unremarkable. No pancreatic ductal dilatation or surrounding inflammatory changes. Spleen: Normal in size without focal abnormality. Adrenals/Urinary Tract: A 1.5 cm cyst in the inferior pole of the right kidney appears minimally complex. An area of nodular appearing density within the inferior portion of the  cyst (series 7 image 79 and series 4 image 25) is likely artifactual. An associated mural nodule is not entirely excluded. Follow-up with ultrasound recommended to better evaluate the cyst and exclude solid component. The kidneys are otherwise unremarkable. There is symmetric uptake and excretion of contrast by kidneys. The urinary bladder is distended and unremarkable. Stomach/Bowel: There is no evidence of bowel obstruction or active inflammation. Normal appendix. Vascular/Lymphatic: There is mild aortoiliac atherosclerotic disease. The origins of the celiac axis, SMA, IMA as well as the origins of the renal arteries are patent. The SMV, splenic vein, and the main portal vein is patent. No portal venous gas identified. There is no adenopathy. Reproductive: The prostate and seminal vesicles are grossly unremarkable. Other: None Musculoskeletal:  No acute fracture. IMPRESSION: 1. No acute/traumatic intrathoracic, abdominal, or pelvic pathology. 2. A 1.5 cm right renal cyst. Nonemergent ultrasound is recommended for further characterization of this cyst. Electronically Signed   By: Laren Everts.D.  On: 09/01/2016 00:26   Ct Cervical Spine Wo Contrast  Result Date: 08/31/2016 CLINICAL DATA:  56 y/o M; found lying on round, altered mental status, intoxication, right arm weakness. EXAM: CT HEAD WITHOUT CONTRAST CT CERVICAL SPINE WITHOUT CONTRAST TECHNIQUE: Multidetector CT imaging of the head and cervical spine was performed following the standard protocol without intravenous contrast. Multiplanar CT image reconstructions of the cervical spine were also generated. COMPARISON:  08/22/2016 CT head and cervical spine. FINDINGS: CT HEAD FINDINGS Brain: No evidence of acute infarction, hemorrhage, hydrocephalus, extra-axial collection or significant mass effect. Stable 7 mm dense focus along the right tentorium, probably a meningioma (series 9, image 44). Vascular: No hyperdense vessel or unexpected calcification.  Skull: Normal. Negative for fracture or focal lesion. Sinuses/Orbits: No acute finding. Other: None. CT CERVICAL SPINE FINDINGS Alignment: Normal. Skull base and vertebrae: Nondisplaced acute fracture through the right C3 foramen transversarium (series 7, image 35). Nondisplaced acute fracture of the right inferior articular facet of the C7 vertebral body (series 12, image 25). Acute fracture through the anterior margin of the superior endplate of the C7 vertebral body (series 13, image 31). Anterior displacement of a bridging osteophyte between the C3 and C4 vertebral bodies and prevertebral edema from C2 -C5 may represent nondisplaced fracture through the C3-4 disc (series image 33). No loss of vertebral body height.  No dislocation. Soft tissues and spinal canal: 7 mm nodule within the right lobe of thyroid. No canal hematoma identified. Disc levels: Stable cervical spondylosis without high-grade bony canal stenosis. Upper chest: Negative. Other: Negative. IMPRESSION: 1. Nondisplaced acute fracture through the right C3 foramen transversarium. CT angiogram of the neck is recommended to evaluate for vascular injury. 2. Nondisplaced acute fracture of the right inferior articular facet of the C7 vertebral body. 3. Minimally displaced acute fracture through the anterior margin of the superior endplate of the C7 vertebral body. 4. Anterior displacement of a bridging osteophyte between the C3 and C4 vertebral bodies and prevertebral edema from C2 -C5 may represent fracture through the C3-4 disc. 5. No dislocation or malalignment of the cervical spine. 6. No acute intracranial abnormality. These results were called by telephone at the time of interpretation on 08/31/2016 at 11:04 pm to Dr. Leo Grosser , who verbally acknowledged these results. Electronically Signed   By: Kristine Garbe M.D.   On: 08/31/2016 23:04   Ct Abdomen Pelvis W Contrast  Result Date: 09/01/2016 CLINICAL DATA:  56 year old male with  fall. Patient complaining of neck and back and leg pain. EXAM: CT CHEST, ABDOMEN, AND PELVIS WITH CONTRAST TECHNIQUE: Multidetector CT imaging of the chest, abdomen and pelvis was performed following the standard protocol during bolus administration of intravenous contrast. CONTRAST:  100 cc Isovue 370 COMPARISON:  chest and pelvic radiographs dated 08/31/2016 FINDINGS: CT CHEST FINDINGS Cardiovascular: There is no cardiomegaly or pericardial effusion. There is mild atherosclerotic calcification of the thoracic aorta. No aneurysmal dilatation or evidence of dissection. The visualized origins of the great vessels of the aortic arch appear patent. The central pulmonary arteries are patent as well. Mediastinum/Nodes: There is no hilar or mediastinal adenopathy. Mild thickened appearance of the esophagus likely related to underdistention. The thyroid gland is grossly unremarkable. Lungs/Pleura: Minimal bibasilar dependent atelectatic changes of the lungs. There is no focal consolidation, pleural effusion, or pneumothorax. The central airways are patent. Musculoskeletal: There is no axillary adenopathy. The chest wall soft tissues appear unremarkable. No acute fracture. CT ABDOMEN PELVIS FINDINGS No intra-abdominal free air or free fluid. Hepatobiliary:  No focal liver abnormality is seen. No gallstones, gallbladder wall thickening, or biliary dilatation. Pancreas: Unremarkable. No pancreatic ductal dilatation or surrounding inflammatory changes. Spleen: Normal in size without focal abnormality. Adrenals/Urinary Tract: A 1.5 cm cyst in the inferior pole of the right kidney appears minimally complex. An area of nodular appearing density within the inferior portion of the cyst (series 7 image 79 and series 4 image 25) is likely artifactual. An associated mural nodule is not entirely excluded. Follow-up with ultrasound recommended to better evaluate the cyst and exclude solid component. The kidneys are otherwise  unremarkable. There is symmetric uptake and excretion of contrast by kidneys. The urinary bladder is distended and unremarkable. Stomach/Bowel: There is no evidence of bowel obstruction or active inflammation. Normal appendix. Vascular/Lymphatic: There is mild aortoiliac atherosclerotic disease. The origins of the celiac axis, SMA, IMA as well as the origins of the renal arteries are patent. The SMV, splenic vein, and the main portal vein is patent. No portal venous gas identified. There is no adenopathy. Reproductive: The prostate and seminal vesicles are grossly unremarkable. Other: None Musculoskeletal:  No acute fracture. IMPRESSION: 1. No acute/traumatic intrathoracic, abdominal, or pelvic pathology. 2. A 1.5 cm right renal cyst. Nonemergent ultrasound is recommended for further characterization of this cyst. Electronically Signed   By: Anner Crete M.D.   On: 09/01/2016 00:26    Procedures Procedures (including critical care time)  Medications Ordered in ED Medications  sodium chloride 0.9 % injection (not administered)  iopamidol (ISOVUE-370) 76 % injection (not administered)  lactated ringers bolus 1,000 mL (1,000 mLs Intravenous New Bag/Given 09/01/16 0034)  lactated ringers bolus 1,000 mL (not administered)  0.9 %  sodium chloride infusion (not administered)  pantoprazole (PROTONIX) EC tablet 40 mg (not administered)    Or  pantoprazole (PROTONIX) injection 40 mg (not administered)  ondansetron (ZOFRAN) tablet 4 mg (not administered)    Or  ondansetron (ZOFRAN) injection 4 mg (not administered)  bisacodyl (DULCOLAX) suppository 10 mg (not administered)  lip balm (CARMEX) ointment 1 application (not administered)  phenol (CHLORASEPTIC) mouth spray 2 spray (not administered)  menthol-cetylpyridinium (CEPACOL) lozenge 3 mg (not administered)  magic mouthwash (not administered)  alum & mag hydroxide-simeth (MAALOX/MYLANTA) 200-200-20 MG/5ML suspension 30 mL (not administered)    metoprolol (LOPRESSOR) injection 5 mg (not administered)  lactated ringers bolus 1,000 mL (not administered)  diphenhydrAMINE (BENADRYL) injection 12.5-25 mg (not administered)  prochlorperazine (COMPAZINE) injection 5-10 mg (not administered)  fentaNYL (SUBLIMAZE) injection 25-50 mcg (not administered)  acetaminophen (TYLENOL) tablet 325-650 mg (not administered)  acetaminophen (TYLENOL) suppository 650 mg (not administered)  oxyCODONE (Oxy IR/ROXICODONE) immediate release tablet 5-10 mg (not administered)  LORazepam (ATIVAN) tablet 1 mg (not administered)    Or  LORazepam (ATIVAN) injection 1 mg (not administered)  thiamine (VITAMIN B-1) tablet 100 mg (not administered)    Or  thiamine (B-1) injection 100 mg (not administered)  folic acid (FOLVITE) tablet 1 mg (not administered)  multivitamin with minerals tablet 1 tablet (not administered)  MEDLINE mouth rinse (not administered)  sodium chloride 0.9 % bolus 1,000 mL (0 mLs Intravenous Stopped 09/01/16 0033)  sodium chloride 0.9 % bolus 1,000 mL (1,000 mLs Intravenous New Bag/Given 08/31/16 2359)  dexamethasone (DECADRON) injection 10 mg (10 mg Intravenous Given 08/31/16 2359)  iopamidol (ISOVUE-370) 76 % injection 100 mL (100 mLs Intravenous Contrast Given 08/31/16 2340)    CRITICAL CARE Performed by: Antonietta Breach   Total critical care time: 35 minutes  Critical care time was exclusive of  separately billable procedures and treating other patients.  Critical care was necessary to treat or prevent imminent or life-threatening deterioration.  Critical care was time spent personally by me on the following activities: development of treatment plan with patient and/or surrogate as well as nursing, discussions with consultants, evaluation of patient's response to treatment, examination of patient, obtaining history from patient or surrogate, ordering and performing treatments and interventions, ordering and review of laboratory studies,  ordering and review of radiographic studies, pulse oximetry and re-evaluation of patient's condition.   Initial Impression / Assessment and Plan / ED Course  I have reviewed the triage vital signs and the nursing notes.  Pertinent labs & imaging results that were available during my care of the patient were reviewed by me and considered in my medical decision making (see chart for details).      56 year old male presents to the emergency department via EMS after suspected fall, unwitnessed. Patient intoxicated. He was found, on surgical evaluation, to have priapism. Foley catheter placed for urinary retention. Initial CT imaging with multiple C-spine fractures. Patient also with evidence of associated inimal luminal narrowing of the right vertebral artery at the level of the right C3 transverse foramen fracture, raising concern for grade 1 intimal injury. Neurosurgery and trauma MD on board with care and have evaluated the patient in the department. Patient will require further imaging with MRI given concerning neurologic examination. No MRI available at this facility given the hour; patient to be transferred to [facility] for further imaging and admission.   Final Clinical Impressions(s) / ED Diagnoses   Final diagnoses:  Closed fracture of cervical vertebra, unspecified cervical vertebral level, initial encounter ([place])  Alcoholic intoxication without complication ([place])  AKI (acute kidney injury) ([place])    New Prescriptions New Prescriptions   No medications on file    I personally performed the services described in this documentation, which was scribed in my presence. The recorded information has been reviewed and is accurate.       Antonietta Breach, PA-C 09/01/16 [id number]    Leo Grosser, MD 09/01/16 [phone]

## 2016-08-31 NOTE — ED Notes (Signed)
Patient transported to CT 

## 2016-08-31 NOTE — H&P (Signed)
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   Ct Cervical Spine Wo Contrast  Result Date: 08/31/2016 CLINICAL DATA:  56 y/o M; found lying on round, altered mental status, intoxication, right arm weakness. EXAM: CT HEAD WITHOUT CONTRAST CT CERVICAL SPINE WITHOUT CONTRAST TECHNIQUE: Multidetector  CT imaging of the head and cervical spine was performed following the standard protocol without intravenous contrast. Multiplanar CT image reconstructions of the cervical spine were also generated. COMPARISON:  08/22/2016 CT head and cervical spine. FINDINGS: CT HEAD FINDINGS Brain: No evidence of acute infarction, hemorrhage, hydrocephalus, extra-axial collection or significant mass effect. Stable 7 mm dense focus along the right tentorium, probably a meningioma (series 9, image 44). Vascular: No hyperdense vessel or unexpected calcification. Skull: Normal. Negative for fracture or focal lesion. Sinuses/Orbits: No acute finding. Other: None. CT CERVICAL SPINE FINDINGS Alignment: Normal. Skull base and  vertebrae: Nondisplaced acute fracture through the right C3 foramen transversarium (series 7, image 35). Nondisplaced acute fracture of the right inferior articular facet of the C7 vertebral body (series 12, image 25). Acute fracture through the anterior margin of the superior endplate of the C7 vertebral body (series 13, image 31). Anterior displacement of a bridging osteophyte between the C3 and C4 vertebral bodies and prevertebral edema from C2 -C5 may represent nondisplaced fracture through the C3-4 disc (series image 33). No loss of vertebral body height.  No dislocation. Soft tissues and spinal canal: 7 mm nodule within the right lobe of thyroid. No canal hematoma identified. Disc levels: Stable cervical spondylosis without high-grade bony canal stenosis. Upper chest: Negative. Other: Negative. IMPRESSION: 1. Nondisplaced acute fracture through the right C3 foramen transversarium. CT angiogram of the neck is recommended to evaluate for vascular injury. 2. Nondisplaced acute fracture of the right inferior articular facet of the C7 vertebral body. 3. Minimally displaced acute fracture through the anterior margin of the superior endplate of the C7 vertebral body. 4. Anterior displacement of a bridging osteophyte between the C3 and C4 vertebral bodies and prevertebral edema from C2 -C5 may represent fracture through the C3-4 disc. 5. No dislocation or malalignment of the cervical spine. 6. No acute intracranial abnormality. These results were called by telephone at the time of interpretation on 08/31/2016 at 11:04 pm to Dr. Leo Grosser , who verbally acknowledged these results. Electronically Signed   By: Kristine Garbe M.D.   On: 08/31/2016 23:04   Ct Cervical Spine Wo Contrast  Result Date: 08/22/2016 CLINICAL DATA:  Possible seizure activity, with loss of consciousness. Concern for head or cervical spine injury. Initial encounter. EXAM: CT HEAD WITHOUT CONTRAST CT CERVICAL SPINE WITHOUT CONTRAST  TECHNIQUE: Multidetector CT imaging of the head and cervical spine was performed following the standard protocol without intravenous contrast. Multiplanar CT image reconstructions of the cervical spine were also generated. COMPARISON:  Cervical spine radiographs performed 03/20/2014 FINDINGS: CT HEAD FINDINGS Brain: No evidence of acute infarction, hemorrhage, hydrocephalus, extra-axial collection or mass lesion/mass effect. Prominence of the sulci suggests mild cortical volume loss. Mild cerebellar atrophy is noted. The brainstem and fourth ventricle are within normal limits. The basal ganglia are unremarkable in appearance. The cerebral hemispheres demonstrate grossly normal gray-white differentiation. No mass effect or midline shift is seen. Vascular: No hyperdense vessel or unexpected calcification. Skull: There is no evidence of fracture; visualized osseous structures are unremarkable in appearance. Sinuses/Orbits: The orbits are within normal limits. The paranasal sinuses and mastoid air cells are well-aerated. Other: No significant soft tissue abnormalities are seen. CT CERVICAL SPINE FINDINGS Alignment: Normal. Mild reversal of the normal lordotic curvature of the cervical spine appears to be degenerative in nature. Skull base and vertebrae: No acute fracture. No primary bone lesion or focal pathologic process. Soft tissues and spinal canal: No prevertebral fluid or swelling. No visible canal hematoma.  Disc levels: Minimal disc space narrowing is noted at C6-C7, with scattered anterior and posterior disc osteophyte complexes along the cervical spine. Upper chest: A small hypodensity at the right thyroid lobe is likely benign in nature, given its size. The visualized lung apices are clear. Other: No additional soft tissue abnormalities are seen. IMPRESSION: 1. No evidence of traumatic intracranial injury or fracture. 2. No evidence of fracture or subluxation along the cervical spine. 3. Mild cortical volume  loss noted. 4. Minimal degenerative change along the lower cervical spine. Electronically Signed   By: Garald Balding M.D.   On: 08/22/2016 19:22   Ct Abdomen Pelvis W Contrast  Result Date: 09/01/2016 CLINICAL DATA:  56 year old male with fall. Patient complaining of neck and back and leg pain. EXAM: CT CHEST, ABDOMEN, AND PELVIS WITH CONTRAST TECHNIQUE: Multidetector CT imaging of the chest, abdomen and pelvis was performed following the standard protocol during bolus administration of intravenous contrast. CONTRAST:  100 cc Isovue 370 COMPARISON:  chest and pelvic radiographs dated 08/31/2016 FINDINGS: CT CHEST FINDINGS Cardiovascular: There is no cardiomegaly or pericardial effusion. There is mild atherosclerotic calcification of the thoracic aorta. No aneurysmal dilatation or evidence of dissection. The visualized origins of the great vessels of the aortic arch appear patent. The central pulmonary arteries are patent as well. Mediastinum/Nodes: There is no hilar or mediastinal adenopathy. Mild thickened appearance of the esophagus likely related to underdistention. The thyroid gland is grossly unremarkable. Lungs/Pleura: Minimal bibasilar dependent atelectatic changes of the lungs. There is no focal consolidation, pleural effusion, or pneumothorax. The central airways are patent. Musculoskeletal: There is no axillary adenopathy. The chest wall soft tissues appear unremarkable. No acute fracture. CT ABDOMEN PELVIS FINDINGS No intra-abdominal free air or free fluid. Hepatobiliary: No focal liver abnormality is seen. No gallstones, gallbladder wall thickening, or biliary dilatation. Pancreas: Unremarkable. No pancreatic ductal dilatation or surrounding inflammatory changes. Spleen: Normal in size without focal abnormality. Adrenals/Urinary Tract: A 1.5 cm cyst in the inferior pole of the right kidney appears minimally complex. An area of nodular appearing density within the inferior portion of the cyst (series  7 image 79 and series 4 image 25) is likely artifactual. An associated mural nodule is not entirely excluded. Follow-up with ultrasound recommended to better evaluate the cyst and exclude solid component. The kidneys are otherwise unremarkable. There is symmetric uptake and excretion of contrast by kidneys. The urinary bladder is distended and unremarkable. Stomach/Bowel: There is no evidence of bowel obstruction or active inflammation. Normal appendix. Vascular/Lymphatic: There is mild aortoiliac atherosclerotic disease. The origins of the celiac axis, SMA, IMA as well as the origins of the renal arteries are patent. The SMV, splenic vein, and the main portal vein is patent. No portal venous gas identified. There is no adenopathy. Reproductive: The prostate and seminal vesicles are grossly unremarkable. Other: None Musculoskeletal:  No acute fracture. IMPRESSION: 1. No acute/traumatic intrathoracic, abdominal, or pelvic pathology. 2. A 1.5 cm right renal cyst. Nonemergent ultrasound is recommended for further characterization of this cyst. Electronically Signed   By: Anner Crete M.D.   On: 09/01/2016 00:26    Medications / Allergies: per chart  Antibiotics: Anti-infectives    None        Note: Portions of this report may have been transcribed using voice recognition software. Every effort was made to ensure accuracy; however, inadvertent computerized transcription errors may be present.   Any transcriptional errors that result from this process are unintentional.    [name],  M.D., F.A.C.S. Gastrointestinal and Minimally Invasive Surgery [facility] [address], [place], [place] [address] [phone] Main / Paging   08/31/2016

## 2016-08-31 NOTE — ED Triage Notes (Signed)
Per EMS pt was found on the ground behind the [employer]  ETOH on board   Incontinent of urine  Pt c/o neck, back and right leg pain  Pt was able to wiggle his toes  Pupils equal and reactive at a 3  Initial blood pressure was 80/50  IV started by EMS and NS 150 ml given   C collar placed by EMS

## 2016-08-31 NOTE — ED Provider Notes (Signed)
MSE was initiated and I personally evaluated the patient and placed orders (if any) at  10:25 PM on August 31, 2016.  The patient appears stable so that the remainder of the MSE may be completed by another provider.   Patient found lying on the ground behind the [place] house. Appears intoxicated on arrival with bilateral nystagmus. Maintaining airway appropriately. Stating he has right arm weakness which is present objectively. Labs and CT head, C-spine and plain films of chest and pelvis were ordered to evaluate for stroke versus intoxication versus traumatic injury. No last known normal.   Leo Grosser, MD 08/31/16 2230

## 2016-09-01 ENCOUNTER — Inpatient Hospital Stay (HOSPITAL_COMMUNITY): Payer: Medicaid Other

## 2016-09-01 DIAGNOSIS — G8252 Quadriplegia, C1-C4 incomplete: Secondary | ICD-10-CM | POA: Diagnosis not present

## 2016-09-01 DIAGNOSIS — G8250 Quadriplegia, unspecified: Secondary | ICD-10-CM | POA: Diagnosis not present

## 2016-09-01 DIAGNOSIS — M792 Neuralgia and neuritis, unspecified: Secondary | ICD-10-CM | POA: Diagnosis not present

## 2016-09-01 DIAGNOSIS — S14129D Central cord syndrome at unspecified level of cervical spinal cord, subsequent encounter: Secondary | ICD-10-CM | POA: Diagnosis not present

## 2016-09-01 DIAGNOSIS — G8220 Paraplegia, unspecified: Secondary | ICD-10-CM | POA: Diagnosis not present

## 2016-09-01 DIAGNOSIS — M79609 Pain in unspecified limb: Secondary | ICD-10-CM | POA: Diagnosis not present

## 2016-09-01 DIAGNOSIS — R208 Other disturbances of skin sensation: Secondary | ICD-10-CM | POA: Diagnosis not present

## 2016-09-01 DIAGNOSIS — Z720 Tobacco use: Secondary | ICD-10-CM

## 2016-09-01 DIAGNOSIS — D62 Acute posthemorrhagic anemia: Secondary | ICD-10-CM | POA: Diagnosis not present

## 2016-09-01 DIAGNOSIS — N319 Neuromuscular dysfunction of bladder, unspecified: Secondary | ICD-10-CM | POA: Diagnosis not present

## 2016-09-01 DIAGNOSIS — M545 Low back pain: Secondary | ICD-10-CM | POA: Diagnosis present

## 2016-09-01 DIAGNOSIS — R52 Pain, unspecified: Secondary | ICD-10-CM | POA: Diagnosis not present

## 2016-09-01 DIAGNOSIS — Y908 Blood alcohol level of 240 mg/100 ml or more: Secondary | ICD-10-CM | POA: Diagnosis present

## 2016-09-01 DIAGNOSIS — F17210 Nicotine dependence, cigarettes, uncomplicated: Secondary | ICD-10-CM | POA: Diagnosis present

## 2016-09-01 DIAGNOSIS — K592 Neurogenic bowel, not elsewhere classified: Secondary | ICD-10-CM | POA: Diagnosis not present

## 2016-09-01 DIAGNOSIS — M549 Dorsalgia, unspecified: Secondary | ICD-10-CM

## 2016-09-01 DIAGNOSIS — G8929 Other chronic pain: Secondary | ICD-10-CM | POA: Diagnosis present

## 2016-09-01 DIAGNOSIS — M542 Cervicalgia: Secondary | ICD-10-CM

## 2016-09-01 DIAGNOSIS — F1010 Alcohol abuse, uncomplicated: Secondary | ICD-10-CM | POA: Diagnosis not present

## 2016-09-01 DIAGNOSIS — S12600A Unspecified displaced fracture of seventh cervical vertebra, initial encounter for closed fracture: Secondary | ICD-10-CM | POA: Diagnosis present

## 2016-09-01 DIAGNOSIS — Z87898 Personal history of other specified conditions: Secondary | ICD-10-CM | POA: Diagnosis not present

## 2016-09-01 DIAGNOSIS — N281 Cyst of kidney, acquired: Secondary | ICD-10-CM | POA: Diagnosis not present

## 2016-09-01 DIAGNOSIS — F10929 Alcohol use, unspecified with intoxication, unspecified: Secondary | ICD-10-CM

## 2016-09-01 DIAGNOSIS — Z79899 Other long term (current) drug therapy: Secondary | ICD-10-CM | POA: Diagnosis not present

## 2016-09-01 DIAGNOSIS — F10129 Alcohol abuse with intoxication, unspecified: Secondary | ICD-10-CM | POA: Diagnosis present

## 2016-09-01 DIAGNOSIS — D649 Anemia, unspecified: Secondary | ICD-10-CM | POA: Diagnosis not present

## 2016-09-01 DIAGNOSIS — S12200A Unspecified displaced fracture of third cervical vertebra, initial encounter for closed fracture: Secondary | ICD-10-CM | POA: Diagnosis present

## 2016-09-01 DIAGNOSIS — S14129S Central cord syndrome at unspecified level of cervical spinal cord, sequela: Secondary | ICD-10-CM | POA: Diagnosis not present

## 2016-09-01 DIAGNOSIS — G839 Paralytic syndrome, unspecified: Secondary | ICD-10-CM | POA: Diagnosis present

## 2016-09-01 DIAGNOSIS — R531 Weakness: Secondary | ICD-10-CM | POA: Diagnosis present

## 2016-09-01 DIAGNOSIS — F10920 Alcohol use, unspecified with intoxication, uncomplicated: Secondary | ICD-10-CM | POA: Diagnosis not present

## 2016-09-01 DIAGNOSIS — R7989 Other specified abnormal findings of blood chemistry: Secondary | ICD-10-CM | POA: Diagnosis not present

## 2016-09-01 DIAGNOSIS — X58XXXA Exposure to other specified factors, initial encounter: Secondary | ICD-10-CM | POA: Diagnosis not present

## 2016-09-01 DIAGNOSIS — G8254 Quadriplegia, C5-C7 incomplete: Secondary | ICD-10-CM | POA: Diagnosis not present

## 2016-09-01 DIAGNOSIS — M4712 Other spondylosis with myelopathy, cervical region: Secondary | ICD-10-CM | POA: Diagnosis not present

## 2016-09-01 DIAGNOSIS — R0789 Other chest pain: Secondary | ICD-10-CM | POA: Diagnosis not present

## 2016-09-01 LAB — CBC
HCT: 28.1 % — ABNORMAL LOW (ref 39.0–52.0)
HEMOGLOBIN: 9 g/dL — AB (ref 13.0–17.0)
MCH: 25.9 pg — AB (ref 26.0–34.0)
MCHC: 32 g/dL (ref 30.0–36.0)
MCV: 81 fL (ref 78.0–100.0)
PLATELETS: 268 10*3/uL (ref 150–400)
RBC: 3.47 MIL/uL — ABNORMAL LOW (ref 4.22–5.81)
RDW: 13.7 % (ref 11.5–15.5)
WBC: 9.7 10*3/uL (ref 4.0–10.5)

## 2016-09-01 LAB — URINALYSIS, ROUTINE W REFLEX MICROSCOPIC
BILIRUBIN URINE: NEGATIVE
GLUCOSE, UA: NEGATIVE mg/dL
HGB URINE DIPSTICK: NEGATIVE
KETONES UR: NEGATIVE mg/dL
Leukocytes, UA: NEGATIVE
Nitrite: NEGATIVE
PROTEIN: NEGATIVE mg/dL
Specific Gravity, Urine: 1.018 (ref 1.005–1.030)
pH: 5 (ref 5.0–8.0)

## 2016-09-01 LAB — RAPID URINE DRUG SCREEN, HOSP PERFORMED
AMPHETAMINES: NOT DETECTED
BARBITURATES: NOT DETECTED
BENZODIAZEPINES: NOT DETECTED
COCAINE: NOT DETECTED
Opiates: NOT DETECTED
TETRAHYDROCANNABINOL: NOT DETECTED

## 2016-09-01 LAB — BASIC METABOLIC PANEL
ANION GAP: 16 — AB (ref 5–15)
BUN: 20 mg/dL (ref 6–20)
CALCIUM: 8.2 mg/dL — AB (ref 8.9–10.3)
CO2: 14 mmol/L — AB (ref 22–32)
CREATININE: 1 mg/dL (ref 0.61–1.24)
Chloride: 107 mmol/L (ref 101–111)
GLUCOSE: 63 mg/dL — AB (ref 65–99)
Potassium: 4.1 mmol/L (ref 3.5–5.1)
Sodium: 137 mmol/L (ref 135–145)

## 2016-09-01 LAB — MRSA PCR SCREENING: MRSA BY PCR: NEGATIVE

## 2016-09-01 MED ORDER — ADULT MULTIVITAMIN W/MINERALS CH: 1.0000 | ORAL_TABLET | Freq: Every day | ORAL | Status: DC

## 2016-09-01 MED ORDER — PANTOPRAZOLE SODIUM 40 MG PO TBEC
40.0000 mg | DELAYED_RELEASE_TABLET | Freq: Every day | ORAL | Status: DC
  Administered 2016-09-02 – 2016-09-05 (×4): 40 mg via ORAL
  Filled 2016-09-01 (×4): qty 1

## 2016-09-01 MED ORDER — PHENOL 1.4 % MT LIQD
2.0000 | OROMUCOSAL | Status: DC | PRN
Start: 2016-09-01 — End: 2016-09-05

## 2016-09-01 MED ORDER — DEXTROSE 5 % IV SOLN
1000.0000 mg | Freq: Three times a day (TID) | INTRAVENOUS | Status: DC | PRN
  Administered 2016-09-01 – 2016-09-02 (×3): 1000 mg via INTRAVENOUS
  Filled 2016-09-01 (×8): qty 10

## 2016-09-01 MED ORDER — SODIUM CHLORIDE 0.9 % IV SOLN
INTRAVENOUS | Status: DC
  Administered 2016-09-01 – 2016-09-04 (×3): via INTRAVENOUS

## 2016-09-01 MED ORDER — PROCHLORPERAZINE EDISYLATE 5 MG/ML IJ SOLN: 5.0000 mg | INTRAMUSCULAR | Status: DC | PRN

## 2016-09-01 MED ORDER — ACETAMINOPHEN 325 MG PO TABS
325.0000 mg | ORAL_TABLET | Freq: Four times a day (QID) | ORAL | Status: DC | PRN
  Administered 2016-09-03 – 2016-09-05 (×3): 650 mg via ORAL
  Filled 2016-09-01 (×3): qty 2

## 2016-09-01 MED ORDER — ALUM & MAG HYDROXIDE-SIMETH 200-200-20 MG/5ML PO SUSP: 30.0000 mL | Freq: Four times a day (QID) | ORAL | Status: DC | PRN

## 2016-09-01 MED ORDER — VITAMIN B-1 100 MG PO TABS
100.0000 mg | ORAL_TABLET | Freq: Every day | ORAL | Status: DC
  Administered 2016-09-02 – 2016-09-05 (×4): 100 mg via ORAL
  Filled 2016-09-01 (×4): qty 1

## 2016-09-01 MED ORDER — ORAL CARE MOUTH RINSE
15.0000 mL | Freq: Two times a day (BID) | OROMUCOSAL | Status: DC
  Administered 2016-09-01: 15 mL via OROMUCOSAL

## 2016-09-01 MED ORDER — LACTATED RINGERS IV BOLUS (SEPSIS): 1000.0000 mL | Freq: Three times a day (TID) | INTRAVENOUS | Status: DC | PRN

## 2016-09-01 MED ORDER — LACTATED RINGERS IV BOLUS (SEPSIS)
1000.0000 mL | Freq: Once | INTRAVENOUS | Status: AC
  Administered 2016-09-01: 1000 mL via INTRAVENOUS

## 2016-09-01 MED ORDER — ONDANSETRON HCL 4 MG/2ML IJ SOLN: 4.0000 mg | Freq: Four times a day (QID) | INTRAMUSCULAR | Status: DC | PRN

## 2016-09-01 MED ORDER — ADULT MULTIVITAMIN W/MINERALS CH
1.0000 | ORAL_TABLET | Freq: Every day | ORAL | Status: DC
  Administered 2016-09-01 – 2016-09-05 (×5): 1 via ORAL
  Filled 2016-09-01 (×5): qty 1

## 2016-09-01 MED ORDER — LORAZEPAM 1 MG PO TABS: 1.0000 mg | ORAL_TABLET | Freq: Four times a day (QID) | ORAL | Status: AC | PRN

## 2016-09-01 MED ORDER — METOPROLOL TARTRATE 5 MG/5ML IV SOLN
5.0000 mg | Freq: Four times a day (QID) | INTRAVENOUS | Status: DC | PRN
Start: 2016-09-01 — End: 2016-09-05
  Administered 2016-09-03: 5 mg via INTRAVENOUS
  Filled 2016-09-01: qty 5

## 2016-09-01 MED ORDER — PREGABALIN 100 MG PO CAPS
100.0000 mg | ORAL_CAPSULE | Freq: Three times a day (TID) | ORAL | Status: DC
  Administered 2016-09-01 – 2016-09-05 (×14): 100 mg via ORAL
  Filled 2016-09-01 (×2): qty 1
  Filled 2016-09-01: qty 2
  Filled 2016-09-01 (×2): qty 1
  Filled 2016-09-01: qty 2
  Filled 2016-09-01: qty 1
  Filled 2016-09-01: qty 2
  Filled 2016-09-01 (×4): qty 1
  Filled 2016-09-01: qty 2
  Filled 2016-09-01 (×2): qty 1

## 2016-09-01 MED ORDER — LORAZEPAM 2 MG/ML IJ SOLN: 1.0000 mg | Freq: Four times a day (QID) | INTRAMUSCULAR | Status: AC | PRN

## 2016-09-01 MED ORDER — MENTHOL 3 MG MT LOZG: 1.0000 | LOZENGE | OROMUCOSAL | Status: DC | PRN

## 2016-09-01 MED ORDER — DIPHENHYDRAMINE HCL 50 MG/ML IJ SOLN: 12.5000 mg | Freq: Four times a day (QID) | INTRAMUSCULAR | Status: DC | PRN

## 2016-09-01 MED ORDER — BISACODYL 10 MG RE SUPP: 10.0000 mg | Freq: Every day | RECTAL | Status: DC | PRN

## 2016-09-01 MED ORDER — OXYCODONE HCL 5 MG PO TABS
5.0000 mg | ORAL_TABLET | ORAL | Status: DC | PRN
  Administered 2016-09-01 – 2016-09-05 (×12): 10 mg via ORAL
  Filled 2016-09-01 (×12): qty 2

## 2016-09-01 MED ORDER — VITAMIN B-1 100 MG PO TABS: 100.0000 mg | ORAL_TABLET | Freq: Every day | ORAL | Status: DC

## 2016-09-01 MED ORDER — ONDANSETRON HCL 4 MG PO TABS: 4.0000 mg | ORAL_TABLET | Freq: Four times a day (QID) | ORAL | Status: DC | PRN

## 2016-09-01 MED ORDER — THIAMINE HCL 100 MG/ML IJ SOLN
100.0000 mg | Freq: Every day | INTRAMUSCULAR | Status: DC
  Administered 2016-09-01: 100 mg via INTRAVENOUS
  Filled 2016-09-01: qty 2

## 2016-09-01 MED ORDER — FOLIC ACID 1 MG PO TABS: 1.0000 mg | ORAL_TABLET | Freq: Every day | ORAL | Status: DC

## 2016-09-01 MED ORDER — ACETAMINOPHEN 650 MG RE SUPP: 650.0000 mg | Freq: Four times a day (QID) | RECTAL | Status: DC | PRN

## 2016-09-01 MED ORDER — PANTOPRAZOLE SODIUM 40 MG IV SOLR
40.0000 mg | Freq: Every day | INTRAVENOUS | Status: DC
  Administered 2016-09-01: 40 mg via INTRAVENOUS
  Filled 2016-09-01: qty 40

## 2016-09-01 MED ORDER — THIAMINE HCL 100 MG/ML IJ SOLN: 100.0000 mg | Freq: Every day | INTRAMUSCULAR | Status: DC

## 2016-09-01 MED ORDER — FENTANYL CITRATE (PF) 100 MCG/2ML IJ SOLN
25.0000 ug | INTRAMUSCULAR | Status: DC | PRN
  Administered 2016-09-01 – 2016-09-02 (×9): 50 ug via INTRAVENOUS
  Filled 2016-09-01 (×8): qty 2

## 2016-09-01 MED ORDER — MAGIC MOUTHWASH
15.0000 mL | Freq: Four times a day (QID) | ORAL | Status: DC | PRN
  Filled 2016-09-01: qty 15

## 2016-09-01 MED ORDER — LORAZEPAM 1 MG PO TABS
0.0000 mg | ORAL_TABLET | Freq: Four times a day (QID) | ORAL | Status: AC
Start: 2016-09-01 — End: 2016-09-03

## 2016-09-01 MED ORDER — LORAZEPAM 1 MG PO TABS: 0.0000 mg | ORAL_TABLET | Freq: Two times a day (BID) | ORAL | Status: AC

## 2016-09-01 MED ORDER — FOLIC ACID 1 MG PO TABS
1.0000 mg | ORAL_TABLET | Freq: Every day | ORAL | Status: DC
  Administered 2016-09-02 – 2016-09-05 (×4): 1 mg via ORAL
  Filled 2016-09-01 (×4): qty 1

## 2016-09-01 MED ORDER — ENOXAPARIN SODIUM 40 MG/0.4ML ~~LOC~~ SOLN
40.0000 mg | SUBCUTANEOUS | Status: DC
  Administered 2016-09-01 – 2016-09-05 (×5): 40 mg via SUBCUTANEOUS
  Filled 2016-09-01 (×5): qty 0.4

## 2016-09-01 MED ORDER — CHLORHEXIDINE GLUCONATE 0.12 % MT SOLN
15.0000 mL | Freq: Two times a day (BID) | OROMUCOSAL | Status: DC
  Administered 2016-09-01: 15 mL via OROMUCOSAL

## 2016-09-01 MED ORDER — LIP MEDEX EX OINT: 1.0000 "application " | TOPICAL_OINTMENT | Freq: Two times a day (BID) | CUTANEOUS | Status: DC

## 2016-09-01 MED ORDER — BLISTEX MEDICATED EX OINT
TOPICAL_OINTMENT | CUTANEOUS | Status: DC | PRN
  Filled 2016-09-01: qty 6.3

## 2016-09-01 NOTE — Progress Notes (Signed)
Follow up - Trauma Critical Care  Patient Details:    Patrick Romero is an 56 y.o. male.  Lines/tubes : Urethral Catheter Avie Echevaria, RN Latex 16 Fr. (Active)  Indication for Insertion or Continuance of Catheter Unstable spinal/crush injuries 09/01/2016  4:30 AM  Site Assessment Clean;Intact 09/01/2016  4:30 AM  Catheter Maintenance Bag below level of bladder;Catheter secured;Drainage bag/tubing not touching floor;Insertion date on drainage bag;No dependent loops;Seal intact 09/01/2016  8:00 AM  Collection Container Standard drainage bag 09/01/2016  4:30 AM  Securement Method Securing device (Describe) 09/01/2016  4:30 AM  Urinary Catheter Interventions Unclamped 09/01/2016  4:30 AM  Output (mL) 150 mL 09/01/2016  8:00 AM    Microbiology/Sepsis markers: Results for orders placed or performed during the hospital encounter of 08/31/16  MRSA PCR Screening     Status: None   Collection Time: 09/01/16  4:32 AM  Result Value Ref Range Status   MRSA by PCR NEGATIVE NEGATIVE Final    Comment:        The GeneXpert MRSA Assay (FDA approved for NASAL specimens only), is one component of a comprehensive MRSA colonization surveillance program. It is not intended to diagnose MRSA infection nor to guide or monitor treatment for MRSA infections.     Anti-infectives:  Anti-infectives    None      Best Practice/Protocols:  VTE Prophylaxis: Mechanical   Consults: Treatment Team:  Trauma Md, MD Ashok Pall, MD   Subjective:    Overnight Issues:   Objective:  Vital signs for last 24 hours: Temp:  [97.8 F (36.6 C)-98.3 F (36.8 C)] 98.3 F (36.8 C) (02/22 0800) Pulse Rate:  [62-91] 91 (02/22 0900) Resp:  [14-21] 17 (02/22 0900) BP: (99-129)/(66-85) 129/78 (02/22 0900) SpO2:  [88 %-100 %] 100 % (02/22 0900) Weight:  [63.2 kg (139 lb 5.3 oz)] 63.2 kg (139 lb 5.3 oz) (02/22 0430)  Hemodynamic parameters for last 24 hours:    Intake/Output from previous day: 02/21 0701 - 02/22  0700 In: 493.3 [I.V.:493.3] Out: 950 [Urine:950]  Intake/Output this shift: Total I/O In: 100 [I.V.:100] Out: 150 [Urine:150]  Vent settings for last 24 hours:    Physical Exam:  General: alert and no respiratory distress Neuro: A&O, BUE 2/5, grip 1/5, RLE 2/5, LLE 3/5. Severe neuropathic pain R>L HEENT/Neck: collar Resp: clear to auscultation bilaterally CVS: RRR GI: soft, NT, +BS Extremities: no edema, no erythema, pulses WNL  Results for orders placed or performed during the hospital encounter of 08/31/16 (from the past 24 hour(s))  Ethanol     Status: Abnormal   Collection Time: 08/31/16 10:51 PM  Result Value Ref Range   Alcohol, Ethyl (B) 324 (HH) <5 mg/dL  Protime-INR     Status: None   Collection Time: 08/31/16 10:51 PM  Result Value Ref Range   Prothrombin Time 13.0 11.4 - 15.2 seconds   INR 0.98   APTT     Status: None   Collection Time: 08/31/16 10:51 PM  Result Value Ref Range   aPTT 28 24 - 36 seconds  CBC     Status: Abnormal   Collection Time: 08/31/16 10:51 PM  Result Value Ref Range   WBC 9.9 4.0 - 10.5 K/uL   RBC 3.29 (L) 4.22 - 5.81 MIL/uL   Hemoglobin 8.4 (L) 13.0 - 17.0 g/dL   HCT 25.9 (L) 39.0 - 52.0 %   MCV 78.7 78.0 - 100.0 fL   MCH 25.5 (L) 26.0 - 34.0 pg   MCHC 32.4  30.0 - 36.0 g/dL   RDW 13.4 11.5 - 15.5 %   Platelets 279 150 - 400 K/uL  Differential     Status: None   Collection Time: 08/31/16 10:51 PM  Result Value Ref Range   Neutrophils Relative % 63 %   Neutro Abs 6.2 1.7 - 7.7 K/uL   Lymphocytes Relative 30 %   Lymphs Abs 2.9 0.7 - 4.0 K/uL   Monocytes Relative 5 %   Monocytes Absolute 0.5 0.1 - 1.0 K/uL   Eosinophils Relative 2 %   Eosinophils Absolute 0.2 0.0 - 0.7 K/uL   Basophils Relative 0 %   Basophils Absolute 0.0 0.0 - 0.1 K/uL  Comprehensive metabolic panel     Status: Abnormal   Collection Time: 08/31/16 10:51 PM  Result Value Ref Range   Sodium 138 135 - 145 mmol/L   Potassium 3.5 3.5 - 5.1 mmol/L   Chloride  108 101 - 111 mmol/L   CO2 22 22 - 32 mmol/L   Glucose, Bld 86 65 - 99 mg/dL   BUN 25 (H) 6 - 20 mg/dL   Creatinine, Ser 1.04 0.61 - 1.24 mg/dL   Calcium 8.7 (L) 8.9 - 10.3 mg/dL   Total Protein 7.3 6.5 - 8.1 g/dL   Albumin 3.8 3.5 - 5.0 g/dL   AST 31 15 - 41 U/L   ALT 17 17 - 63 U/L   Alkaline Phosphatase 51 38 - 126 U/L   Total Bilirubin 0.4 0.3 - 1.2 mg/dL   GFR calc non Af Amer >60 >60 mL/min   GFR calc Af Amer >60 >60 mL/min   Anion gap 8 5 - 15  I-stat troponin, ED (not at [facility], [facility])     Status: None   Collection Time: 08/31/16 11:00 PM  Result Value Ref Range   Troponin i, poc 0.00 0.00 - 0.08 ng/mL   Comment 3          I-Stat Chem 8, ED  (not at [facility], [facility])     Status: Abnormal   Collection Time: 08/31/16 11:02 PM  Result Value Ref Range   Sodium 141 135 - 145 mmol/L   Potassium 3.6 3.5 - 5.1 mmol/L   Chloride 105 101 - 111 mmol/L   BUN 25 (H) 6 - 20 mg/dL   Creatinine, Ser 1.40 (H) 0.61 - 1.24 mg/dL   Glucose, Bld 83 65 - 99 mg/dL   Calcium, Ion 1.10 (L) 1.15 - 1.40 mmol/L   TCO2 23 0 - 100 mmol/L   Hemoglobin 10.5 (L) 13.0 - 17.0 g/dL   HCT 31.0 (L) 39.0 - 52.0 %  Urine rapid drug screen (hosp performed)not at [facility]     Status: None   Collection Time: 09/01/16 12:40 AM  Result Value Ref Range   Opiates NONE DETECTED NONE DETECTED   Cocaine NONE DETECTED NONE DETECTED   Benzodiazepines NONE DETECTED NONE DETECTED   Amphetamines NONE DETECTED NONE DETECTED   Tetrahydrocannabinol NONE DETECTED NONE DETECTED   Barbiturates NONE DETECTED NONE DETECTED  Urinalysis, Routine w reflex microscopic     Status: None   Collection Time: 09/01/16 12:40 AM  Result Value Ref Range   Color, Urine YELLOW YELLOW   APPearance CLEAR CLEAR   Specific Gravity, Urine 1.018 1.005 - 1.030   pH 5.0 5.0 - 8.0   Glucose, UA NEGATIVE NEGATIVE mg/dL   Hgb urine dipstick NEGATIVE NEGATIVE   Bilirubin Urine NEGATIVE NEGATIVE   Ketones, ur NEGATIVE NEGATIVE mg/dL   Protein, ur  NEGATIVE  NEGATIVE mg/dL   Nitrite NEGATIVE NEGATIVE   Leukocytes, UA NEGATIVE NEGATIVE  MRSA PCR Screening     Status: None   Collection Time: 09/01/16  4:32 AM  Result Value Ref Range   MRSA by PCR NEGATIVE NEGATIVE  Basic metabolic panel     Status: Abnormal   Collection Time: 09/01/16  8:38 AM  Result Value Ref Range   Sodium 137 135 - 145 mmol/L   Potassium 4.1 3.5 - 5.1 mmol/L   Chloride 107 101 - 111 mmol/L   CO2 14 (L) 22 - 32 mmol/L   Glucose, Bld 63 (L) 65 - 99 mg/dL   BUN 20 6 - 20 mg/dL   Creatinine, Ser 1.00 0.61 - 1.24 mg/dL   Calcium 8.2 (L) 8.9 - 10.3 mg/dL   GFR calc non Af Amer >60 >60 mL/min   GFR calc Af Amer >60 >60 mL/min   Anion gap 16 (H) 5 - 15  CBC     Status: Abnormal   Collection Time: 09/01/16  8:38 AM  Result Value Ref Range   WBC 9.7 4.0 - 10.5 K/uL   RBC 3.47 (L) 4.22 - 5.81 MIL/uL   Hemoglobin 9.0 (L) 13.0 - 17.0 g/dL   HCT 28.1 (L) 39.0 - 52.0 %   MCV 81.0 78.0 - 100.0 fL   MCH 25.9 (L) 26.0 - 34.0 pg   MCHC 32.0 30.0 - 36.0 g/dL   RDW 13.7 11.5 - 15.5 %   Platelets 268 150 - 400 K/uL    Assessment & Plan: Present on Admission: **None**    LOS: 0 days   Additional comments:I reviewed the patient's new clinical lab test results. . Found down  C3 foramen, C3-4 osteophyte FX, C7 facet FX - collar per Dr. Prescott Gum cord vs spinal infarct - no cervical decompression needed per Dr. Christella Noa. Add Lyrica. PT/OT FEN - reg diet ETOH abuse - CIWA VTE - PAS, Lovenox if OK with Dr. Alita Chyle - PT/OT. CIR eval. ICU today. He works at the [employer].  Critical Care Total Time*: 32 Minutes including neuro critical care  Georganna Skeans, MD, MPH, FACS Trauma: [phone] General Surgery: [phone]  09/01/2016  *Care during the described time interval was provided by me. I have reviewed this patient's available data, including medical history, events of note, physical examination and test results as part of my  evaluation.  Patient ID: Patrick Romero, male   DOB: 04/09/1961, 56 y.o.   MRN: [id number]

## 2016-09-01 NOTE — ED Notes (Signed)
Dr Johney Maine, Almon Register, RN and Avie Echevaria, RN in room to assess pt  Pt's clothes removed  Pt log rolled and body assessed for any further injury  None noted  Pt's penis erect  Dr Johney Maine assessed rectal tone  Pt placed in gown and covered with warm blankets  Pt tolerated well

## 2016-09-01 NOTE — ED Notes (Signed)
Put pt's soiled tops in trash

## 2016-09-01 NOTE — Care Management Note (Addendum)
Case Management Note  Patient Details  Name: CALLAGHAN FOERST MRN: [id number] Date of Birth: Oct 07, 1960  Subjective/Objective: Pt found down 08/31/16 with C3 foramen, C3-4 osteophyte fx, C7 facet fx and central cord vs spinal infarct.  Pt with incomplete quadriplegia.  PTA, pt independent, lives with significant other.                      Action/Plan: Will follow for discharge planning as pt progresses.  PT/OT evaluations when able to tolerate medically.    Expected Discharge Date:                  Expected Discharge Plan:  [place]  In-House Referral:     Discharge planning Services  CM Consult  Post Acute Care Choice:    Choice offered to:     DME Arranged:    DME Agency:     HH Arranged:    [place] Agency:     Status of Service:  In process, will continue to follow  If discussed at Long Length of Stay Meetings, dates discussed:    Additional Comments:  Reinaldo Raddle, RN, BSN  Trauma/Neuro ICU Case Manager [phone]

## 2016-09-01 NOTE — ED Notes (Signed)
[facility] called for transport to [facility]

## 2016-09-01 NOTE — Progress Notes (Signed)
SLP Cancellation Note  Patient Details Name: Patrick Romero MRN: [id number] DOB: July 11, 1961   Cancelled treatment:       Reason Eval/Treat Not Completed: Other (comment) Orders for swallow evaluation received. Per RN, pt is not to have more than ice chips at this time until cleared by MD. Therefore, will hold swallow assessment pending clearance from MD for additional POs. Pager number left with RN.   Germain Osgood 09/01/2016, 10:07 AM  Germain Osgood, M.A. CCC-SLP [phone]

## 2016-09-01 NOTE — ED Notes (Signed)
Neuro surgery at bedside.

## 2016-09-01 NOTE — Consult Note (Signed)
Reason for Consult:Cspine fractures Referring Physician: ED  Patrick Romero is an 56 y.o. male.  HPI: found down, unresponsive behind a [employer]. He reported right upper extremity weakness. CT cervical spine shows multiple non displaced fractures non of which affect the spinal canal.   Past Medical History:  Diagnosis Date  . Herniated disc, cervical     History reviewed. No pertinent surgical history.  History reviewed. No pertinent family history.  Social History:  reports that he has been smoking Cigarettes.  He has been smoking about 0.25 packs per day. He has never used smokeless tobacco. He reports that he drinks alcohol. He reports that he uses drugs, including Marijuana.  Allergies: No Known Allergies  Medications: I have reviewed the patient's current medications.  Results for orders placed or performed during the hospital encounter of 08/31/16 (from the past 48 hour(s))  Ethanol     Status: Abnormal   Collection Time: 08/31/16 10:51 PM  Result Value Ref Range   Alcohol, Ethyl (B) 324 (HH) <5 mg/dL    Comment:        LOWEST DETECTABLE LIMIT FOR SERUM ALCOHOL IS 5 mg/dL FOR MEDICAL PURPOSES ONLY CRITICAL RESULT CALLED TO, READ BACK BY AND VERIFIED WITH: DR Leo Grosser [id number] @ [id number] BY J SCOTTON   Protime-INR     Status: None   Collection Time: 08/31/16 10:51 PM  Result Value Ref Range   Prothrombin Time 13.0 11.4 - 15.2 seconds   INR 0.98   APTT     Status: None   Collection Time: 08/31/16 10:51 PM  Result Value Ref Range   aPTT 28 24 - 36 seconds  CBC     Status: Abnormal   Collection Time: 08/31/16 10:51 PM  Result Value Ref Range   WBC 9.9 4.0 - 10.5 K/uL   RBC 3.29 (L) 4.22 - 5.81 MIL/uL   Hemoglobin 8.4 (L) 13.0 - 17.0 g/dL   HCT 25.9 (L) 39.0 - 52.0 %   MCV 78.7 78.0 - 100.0 fL   MCH 25.5 (L) 26.0 - 34.0 pg   MCHC 32.4 30.0 - 36.0 g/dL   RDW 13.4 11.5 - 15.5 %   Platelets 279 150 - 400 K/uL  Differential     Status: None   Collection Time:  08/31/16 10:51 PM  Result Value Ref Range   Neutrophils Relative % 63 %   Neutro Abs 6.2 1.7 - 7.7 K/uL   Lymphocytes Relative 30 %   Lymphs Abs 2.9 0.7 - 4.0 K/uL   Monocytes Relative 5 %   Monocytes Absolute 0.5 0.1 - 1.0 K/uL   Eosinophils Relative 2 %   Eosinophils Absolute 0.2 0.0 - 0.7 K/uL   Basophils Relative 0 %   Basophils Absolute 0.0 0.0 - 0.1 K/uL  Comprehensive metabolic panel     Status: Abnormal   Collection Time: 08/31/16 10:51 PM  Result Value Ref Range   Sodium 138 135 - 145 mmol/L   Potassium 3.5 3.5 - 5.1 mmol/L   Chloride 108 101 - 111 mmol/L   CO2 22 22 - 32 mmol/L   Glucose, Bld 86 65 - 99 mg/dL   BUN 25 (H) 6 - 20 mg/dL   Creatinine, Ser 1.04 0.61 - 1.24 mg/dL   Calcium 8.7 (L) 8.9 - 10.3 mg/dL   Total Protein 7.3 6.5 - 8.1 g/dL   Albumin 3.8 3.5 - 5.0 g/dL   AST 31 15 - 41 U/L   ALT 17 17 - 63 U/L  Alkaline Phosphatase 51 38 - 126 U/L   Total Bilirubin 0.4 0.3 - 1.2 mg/dL   GFR calc non Af Amer >60 >60 mL/min   GFR calc Af Amer >60 >60 mL/min    Comment: (NOTE) The eGFR has been calculated using the CKD EPI equation. This calculation has not been validated in all clinical situations. eGFR's persistently <60 mL/min signify possible Chronic Kidney Disease.    Anion gap 8 5 - 15  I-stat troponin, ED (not at [facility], [facility])     Status: None   Collection Time: 08/31/16 11:00 PM  Result Value Ref Range   Troponin i, poc 0.00 0.00 - 0.08 ng/mL   Comment 3            Comment: Due to the release kinetics of cTnI, a negative result within the first hours of the onset of symptoms does not rule out myocardial infarction with certainty. If myocardial infarction is still suspected, repeat the test at appropriate intervals.   I-Stat Chem 8, ED  (not at [facility], [facility])     Status: Abnormal   Collection Time: 08/31/16 11:02 PM  Result Value Ref Range   Sodium 141 135 - 145 mmol/L   Potassium 3.6 3.5 - 5.1 mmol/L   Chloride 105 101 - 111 mmol/L   BUN 25 (H) 6 -  20 mg/dL   Creatinine, Ser 1.40 (H) 0.61 - 1.24 mg/dL   Glucose, Bld 83 65 - 99 mg/dL   Calcium, Ion 1.10 (L) 1.15 - 1.40 mmol/L   TCO2 23 0 - 100 mmol/L   Hemoglobin 10.5 (L) 13.0 - 17.0 g/dL   HCT 31.0 (L) 39.0 - 52.0 %    Dg Chest 1 View  Result Date: 08/31/2016 CLINICAL DATA:  56 year old male with fall. EXAM: CHEST 1 VIEW COMPARISON:  Chest radiograph dated 08/22/2016 FINDINGS: The lungs are clear. There is no pleural effusion or pneumothorax. The cardiac silhouette is within normal limits. Mild tortuosity of the thoracic aorta. No acute osseous pathology. IMPRESSION: No active disease. Electronically Signed   By: Anner Crete M.D.   On: 08/31/2016 22:52   Dg Pelvis 1-2 Views  Result Date: 08/31/2016 CLINICAL DATA:  56 year old male with fall. EXAM: PELVIS - 1-2 VIEW COMPARISON:  Lumbar spine radiograph dated 03/20/2014 FINDINGS: There is no acute fracture or dislocation. Mild osteopenia. No arthritic changes. The soft tissues appear unremarkable. IMPRESSION: No acute/traumatic osseous pathology. Electronically Signed   By: Anner Crete M.D.   On: 08/31/2016 22:53   Ct Head Wo Contrast  Result Date: 08/31/2016 CLINICAL DATA:  56 y/o M; found lying on round, altered mental status, intoxication, right arm weakness. EXAM: CT HEAD WITHOUT CONTRAST CT CERVICAL SPINE WITHOUT CONTRAST TECHNIQUE: Multidetector CT imaging of the head and cervical spine was performed following the standard protocol without intravenous contrast. Multiplanar CT image reconstructions of the cervical spine were also generated. COMPARISON:  08/22/2016 CT head and cervical spine. FINDINGS: CT HEAD FINDINGS Brain: No evidence of acute infarction, hemorrhage, hydrocephalus, extra-axial collection or significant mass effect. Stable 7 mm dense focus along the right tentorium, probably a meningioma (series 9, image 44). Vascular: No hyperdense vessel or unexpected calcification. Skull: Normal. Negative for fracture or  focal lesion. Sinuses/Orbits: No acute finding. Other: None. CT CERVICAL SPINE FINDINGS Alignment: Normal. Skull base and vertebrae: Nondisplaced acute fracture through the right C3 foramen transversarium (series 7, image 35). Nondisplaced acute fracture of the right inferior articular facet of the C7 vertebral body (series 12, image 25).  Acute fracture through the anterior margin of the superior endplate of the C7 vertebral body (series 13, image 31). Anterior displacement of a bridging osteophyte between the C3 and C4 vertebral bodies and prevertebral edema from C2 -C5 may represent nondisplaced fracture through the C3-4 disc (series image 33). No loss of vertebral body height.  No dislocation. Soft tissues and spinal canal: 7 mm nodule within the right lobe of thyroid. No canal hematoma identified. Disc levels: Stable cervical spondylosis without high-grade bony canal stenosis. Upper chest: Negative. Other: Negative. IMPRESSION: 1. Nondisplaced acute fracture through the right C3 foramen transversarium. CT angiogram of the neck is recommended to evaluate for vascular injury. 2. Nondisplaced acute fracture of the right inferior articular facet of the C7 vertebral body. 3. Minimally displaced acute fracture through the anterior margin of the superior endplate of the C7 vertebral body. 4. Anterior displacement of a bridging osteophyte between the C3 and C4 vertebral bodies and prevertebral edema from C2 -C5 may represent fracture through the C3-4 disc. 5. No dislocation or malalignment of the cervical spine. 6. No acute intracranial abnormality. These results were called by telephone at the time of interpretation on 08/31/2016 at 11:04 pm to Dr. Leo Grosser , who verbally acknowledged these results. Electronically Signed   By: Kristine Garbe M.D.   On: 08/31/2016 23:04   Ct Chest W Contrast  Result Date: 09/01/2016 CLINICAL DATA:  56 year old male with fall. Patient complaining of neck and back and leg  pain. EXAM: CT CHEST, ABDOMEN, AND PELVIS WITH CONTRAST TECHNIQUE: Multidetector CT imaging of the chest, abdomen and pelvis was performed following the standard protocol during bolus administration of intravenous contrast. CONTRAST:  100 cc Isovue 370 COMPARISON:  chest and pelvic radiographs dated 08/31/2016 FINDINGS: CT CHEST FINDINGS Cardiovascular: There is no cardiomegaly or pericardial effusion. There is mild atherosclerotic calcification of the thoracic aorta. No aneurysmal dilatation or evidence of dissection. The visualized origins of the great vessels of the aortic arch appear patent. The central pulmonary arteries are patent as well. Mediastinum/Nodes: There is no hilar or mediastinal adenopathy. Mild thickened appearance of the esophagus likely related to underdistention. The thyroid gland is grossly unremarkable. Lungs/Pleura: Minimal bibasilar dependent atelectatic changes of the lungs. There is no focal consolidation, pleural effusion, or pneumothorax. The central airways are patent. Musculoskeletal: There is no axillary adenopathy. The chest wall soft tissues appear unremarkable. No acute fracture. CT ABDOMEN PELVIS FINDINGS No intra-abdominal free air or free fluid. Hepatobiliary: No focal liver abnormality is seen. No gallstones, gallbladder wall thickening, or biliary dilatation. Pancreas: Unremarkable. No pancreatic ductal dilatation or surrounding inflammatory changes. Spleen: Normal in size without focal abnormality. Adrenals/Urinary Tract: A 1.5 cm cyst in the inferior pole of the right kidney appears minimally complex. An area of nodular appearing density within the inferior portion of the cyst (series 7 image 79 and series 4 image 25) is likely artifactual. An associated mural nodule is not entirely excluded. Follow-up with ultrasound recommended to better evaluate the cyst and exclude solid component. The kidneys are otherwise unremarkable. There is symmetric uptake and excretion of  contrast by kidneys. The urinary bladder is distended and unremarkable. Stomach/Bowel: There is no evidence of bowel obstruction or active inflammation. Normal appendix. Vascular/Lymphatic: There is mild aortoiliac atherosclerotic disease. The origins of the celiac axis, SMA, IMA as well as the origins of the renal arteries are patent. The SMV, splenic vein, and the main portal vein is patent. No portal venous gas identified. There is no adenopathy. Reproductive:  The prostate and seminal vesicles are grossly unremarkable. Other: None Musculoskeletal:  No acute fracture. IMPRESSION: 1. No acute/traumatic intrathoracic, abdominal, or pelvic pathology. 2. A 1.5 cm right renal cyst. Nonemergent ultrasound is recommended for further characterization of this cyst. Electronically Signed   By: Anner Crete M.D.   On: 09/01/2016 00:26   Ct Cervical Spine Wo Contrast  Result Date: 08/31/2016 CLINICAL DATA:  56 y/o M; found lying on round, altered mental status, intoxication, right arm weakness. EXAM: CT HEAD WITHOUT CONTRAST CT CERVICAL SPINE WITHOUT CONTRAST TECHNIQUE: Multidetector CT imaging of the head and cervical spine was performed following the standard protocol without intravenous contrast. Multiplanar CT image reconstructions of the cervical spine were also generated. COMPARISON:  08/22/2016 CT head and cervical spine. FINDINGS: CT HEAD FINDINGS Brain: No evidence of acute infarction, hemorrhage, hydrocephalus, extra-axial collection or significant mass effect. Stable 7 mm dense focus along the right tentorium, probably a meningioma (series 9, image 44). Vascular: No hyperdense vessel or unexpected calcification. Skull: Normal. Negative for fracture or focal lesion. Sinuses/Orbits: No acute finding. Other: None. CT CERVICAL SPINE FINDINGS Alignment: Normal. Skull base and vertebrae: Nondisplaced acute fracture through the right C3 foramen transversarium (series 7, image 35). Nondisplaced acute fracture of  the right inferior articular facet of the C7 vertebral body (series 12, image 25). Acute fracture through the anterior margin of the superior endplate of the C7 vertebral body (series 13, image 31). Anterior displacement of a bridging osteophyte between the C3 and C4 vertebral bodies and prevertebral edema from C2 -C5 may represent nondisplaced fracture through the C3-4 disc (series image 33). No loss of vertebral body height.  No dislocation. Soft tissues and spinal canal: 7 mm nodule within the right lobe of thyroid. No canal hematoma identified. Disc levels: Stable cervical spondylosis without high-grade bony canal stenosis. Upper chest: Negative. Other: Negative. IMPRESSION: 1. Nondisplaced acute fracture through the right C3 foramen transversarium. CT angiogram of the neck is recommended to evaluate for vascular injury. 2. Nondisplaced acute fracture of the right inferior articular facet of the C7 vertebral body. 3. Minimally displaced acute fracture through the anterior margin of the superior endplate of the C7 vertebral body. 4. Anterior displacement of a bridging osteophyte between the C3 and C4 vertebral bodies and prevertebral edema from C2 -C5 may represent fracture through the C3-4 disc. 5. No dislocation or malalignment of the cervical spine. 6. No acute intracranial abnormality. These results were called by telephone at the time of interpretation on 08/31/2016 at 11:04 pm to Dr. Leo Grosser , who verbally acknowledged these results. Electronically Signed   By: Kristine Garbe M.D.   On: 08/31/2016 23:04   Ct Abdomen Pelvis W Contrast  Result Date: 09/01/2016 CLINICAL DATA:  56 year old male with fall. Patient complaining of neck and back and leg pain. EXAM: CT CHEST, ABDOMEN, AND PELVIS WITH CONTRAST TECHNIQUE: Multidetector CT imaging of the chest, abdomen and pelvis was performed following the standard protocol during bolus administration of intravenous contrast. CONTRAST:  100 cc Isovue  370 COMPARISON:  chest and pelvic radiographs dated 08/31/2016 FINDINGS: CT CHEST FINDINGS Cardiovascular: There is no cardiomegaly or pericardial effusion. There is mild atherosclerotic calcification of the thoracic aorta. No aneurysmal dilatation or evidence of dissection. The visualized origins of the great vessels of the aortic arch appear patent. The central pulmonary arteries are patent as well. Mediastinum/Nodes: There is no hilar or mediastinal adenopathy. Mild thickened appearance of the esophagus likely related to underdistention. The thyroid gland is grossly  unremarkable. Lungs/Pleura: Minimal bibasilar dependent atelectatic changes of the lungs. There is no focal consolidation, pleural effusion, or pneumothorax. The central airways are patent. Musculoskeletal: There is no axillary adenopathy. The chest wall soft tissues appear unremarkable. No acute fracture. CT ABDOMEN PELVIS FINDINGS No intra-abdominal free air or free fluid. Hepatobiliary: No focal liver abnormality is seen. No gallstones, gallbladder wall thickening, or biliary dilatation. Pancreas: Unremarkable. No pancreatic ductal dilatation or surrounding inflammatory changes. Spleen: Normal in size without focal abnormality. Adrenals/Urinary Tract: A 1.5 cm cyst in the inferior pole of the right kidney appears minimally complex. An area of nodular appearing density within the inferior portion of the cyst (series 7 image 79 and series 4 image 25) is likely artifactual. An associated mural nodule is not entirely excluded. Follow-up with ultrasound recommended to better evaluate the cyst and exclude solid component. The kidneys are otherwise unremarkable. There is symmetric uptake and excretion of contrast by kidneys. The urinary bladder is distended and unremarkable. Stomach/Bowel: There is no evidence of bowel obstruction or active inflammation. Normal appendix. Vascular/Lymphatic: There is mild aortoiliac atherosclerotic disease. The origins of  the celiac axis, SMA, IMA as well as the origins of the renal arteries are patent. The SMV, splenic vein, and the main portal vein is patent. No portal venous gas identified. There is no adenopathy. Reproductive: The prostate and seminal vesicles are grossly unremarkable. Other: None Musculoskeletal:  No acute fracture. IMPRESSION: 1. No acute/traumatic intrathoracic, abdominal, or pelvic pathology. 2. A 1.5 cm right renal cyst. Nonemergent ultrasound is recommended for further characterization of this cyst. Electronically Signed   By: Anner Crete M.D.   On: 09/01/2016 00:26    Review of Systems  Musculoskeletal: Positive for falls and neck pain.  Neurological: Positive for focal weakness, loss of consciousness and weakness.   Blood pressure 99/66, pulse 66, resp. rate 18, SpO2 97 %. Physical Exam  Constitutional: He appears well-developed and well-nourished. He appears lethargic.  HENT:  Dried dirt on head, unkempt  Eyes: EOM are normal. Pupils are equal, round, and reactive to light.  Neck:  In hard collar  Respiratory: Effort normal.  GI: Soft.  Musculoskeletal:  No movement lower extremities No movement right upper extremity 3/5 left upper extremity Decreased sensation upper extremities, torso, lower extremities Poor rectal tone, d Intoxicated  Poor historian  Neurological: He appears lethargic. A sensory deficit is present.  Strength abnormal, weak in all extremities Gait not assessed Amnestic for event, does not know why he was on the ground  Skin: Skin is dry.  Psychiatric:  Unable to examine due to intoxication.    Assessment/Plan: CT scan shows multiple minimally displaced fractures, non of which should lead to his dense plegia. For stability needs nothing more than a cervical collar.  Due to profound neurologic deficits he will need an MRI of the cervical spine asap. [facility] as nighttime [facility] not available at [facility] 09/01/2016, 12:30 AM

## 2016-09-01 NOTE — Progress Notes (Addendum)
Attempted to call pt's mother @ bedside per patient request. Unable to reach her and phone did not have voicemail.   Update: Patient was able to reach and update mother, sister Britt Boozer) and girlfriend Thayer Headings).

## 2016-09-01 NOTE — ED Notes (Signed)
Spoke with Thayer Headings, pt's fiancee, per pt request and informed her that pt was here and being transferred to [facility]

## 2016-09-02 ENCOUNTER — Encounter (HOSPITAL_COMMUNITY): Payer: Self-pay | Admitting: Physical Medicine and Rehabilitation

## 2016-09-02 DIAGNOSIS — F10920 Alcohol use, unspecified with intoxication, uncomplicated: Secondary | ICD-10-CM

## 2016-09-02 DIAGNOSIS — N319 Neuromuscular dysfunction of bladder, unspecified: Secondary | ICD-10-CM

## 2016-09-02 DIAGNOSIS — G8220 Paraplegia, unspecified: Secondary | ICD-10-CM

## 2016-09-02 DIAGNOSIS — K592 Neurogenic bowel, not elsewhere classified: Secondary | ICD-10-CM

## 2016-09-02 NOTE — Progress Notes (Signed)
Patient ID: Patrick Romero, male   DOB: [birth year], 56 y.o.   MRN: [id number] BP 126/82 (BP Location: Left Arm)   Pulse 76   Temp 99.8 F (37.7 C) (Oral)   Resp 20   Ht 6' (1.829 m)   Wt 63.2 kg (139 lb 5.3 oz)   SpO2 100%   BMI 18.90 kg/m  Alert and oriented x 4 speech is clear and fluent perrl 0/5 right upper extremity Right lower extremity 3/5 Left lower extremity 4/5 Left upper extremity 3/5, intrinsics 2/5 Exam is slightly improved. Continue with collar CIR recommended

## 2016-09-02 NOTE — Clinical Social Work Note (Addendum)
Clinical Social Work Assessment  Patient Details  Name: RIOT SIPP MRN: [id number] Date of Birth: June 01, 1961  Date of referral:  09/02/16               Reason for consult:  Substance Use/ETOH Abuse             Permission sought to share information with:  Family Supports Permission granted to share information::  Yes, Verbal Permission Granted  Name::      Quintel Delfierro (Mother) [phone])  Agency::     Relationship::     Contact Information:     Housing/Transportation Living arrangements for the past 2 months:  Apartment Source of Information:  Patient Patient Interpreter Needed:  None Criminal Activity/Legal Involvement Pertinent to Current Situation/Hospitalization:  No - Comment as needed Significant Relationships:  Parents, Significant Other, Other Family Members, Siblings, Friend, Dependent Children Lives with:  Significant Other Do you feel safe going back to the place where you live?  Yes Need for family participation in patient care:  No (Coment)  Care giving concerns:  Patient reports that he lives with his fiance who can provide 24 hour support. Patient is also in agreement to short-term rehabilitation at a skilled nursing facility.   Social Worker assessment / plan:  Patient is a 56 YO male who was found unresponsive behind [employer]. ETOH level 324. PT/OT evaluations done today revealing deficits in mobility and self care tasks due to quadriplegia. CIR recommended for follow up therapy. CSW engaged with Patient at his bedside. CSW introduced self, role of CSW, and discussed discharge planning and substance abuse concerns. Patient reports that he lives with his fiance who can provide 24 hour support. Patient is also in agreement to short-term rehabilitation at a skilled nursing facility or CIR. Patient reports that he was drinking alcohol prior to being admitted to the hospital, but reports that he is unable to recall the events that resulted in him being found  down behind the [employer]aff[employer] house. Patient reports that he thinks he had to use the restroom and reports that the bathroom was full so he went outside. Patient self-disclosed that he does drink alcohol about 3/week and reports that he usually splits a 6 pack with 1-2 other people when he does drink. Patient also self-reports marijuana use 1-2 times every couple of weeks when "one of buddies has a blunt, I'll take a hit". Patient reports that his sister feels that he has a drinking problem but Patient reports that he does not agree and reports that he is agreeable to go to NA/AA meetings in his neighborhood. CSW offered to provide Patient with resources, however, Patient declined and reported that he is familiar with the NA/AA meeting locations. CSW discussed the effects of substance abuse on Patient's physical and mental health. Patient able to verbalize understanding.   CSW inquired about medicaid/disability status. Patient screened by Financial Counseling. Patient reports that he applied for Medicaid about two years ago and was denied. Patient reports that he has a disability hearing scheduled for September 21, 2016. Patient's lawyer is with [employer] (Case Manager Ms. Lavella Hammock [phone]). Patient states that he has a 29 y/o son that lives with patient's mother and does receive Medicaid. 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 Patient has no vehicle, no bank accounts, and when he works he gets paid to a pay card. CSW discussed SNF placement process and PT's recommendation  for CIR w/ SNF backup. Patient agreeable.   Employment status:  Systems developer information:  Self Pay (Medicaid Pending) PT Recommendations:  Inpatient Rehab Consult Information / Referral to community resources:  Residential Substance Abuse Treatment Options, Outpatient Substance Abuse Treatment Options, Skilled Nursing Facility, SBIRT  Patient/Family's  Response to care:  Patient reports appreciation of care received at this time. Patient agreeable to CIR recommendation with SNF back up.   Patient/Family's Understanding of and Emotional Response to Diagnosis, Current Treatment, and Prognosis:  Patient able to verbalize understanding of diagnosis, current treatment, and prognosis. Patient agreeable to short-term rehab. Patient appears to be minimizing his alcohol use and at this time does not feel that he has a problem with alcohol. Patient is in agreement, however, to go to [employer].   Emotional Assessment Appearance:  Appears stated age Attitude/Demeanor/Rapport:   (Cooperative; Engaging; Pleasant) Affect (typically observed):  Appropriate, Calm, Pleasant Orientation:  Oriented to Self, Oriented to Place, Oriented to  Time, Oriented to Situation Alcohol / Substance use:  Tobacco Use, Alcohol Use, Illicit Drugs Psych involvement (Current and /or in the community):  No (Comment)  Discharge Needs  Concerns to be addressed:  Discharge Planning Concerns, Care Coordination, Substance Abuse Concerns Readmission within the last 30 days:  No Current discharge risk:  None Barriers to Discharge:  Continued Medical Work up   [employer], LCSW 09/02/2016, 12:02 PM

## 2016-09-02 NOTE — Consult Note (Signed)
Physical Medicine and Rehabilitation Consult   Reason for Consult: Incomplete quadriplegia Referring Physician: Dr. Christella Noa.    HPI: Patrick Romero is a 56 y.o. male who was found unresponsive behind [facility] with LUE weakness and lack of movement BLE and RUE as well as poor rectal tone. ETOH level 324.  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 He was evaluated by Dr. Christella Noa who recommended cervical collar for support and Lyrica added to help manage dysesthesias. PT/OT evaluations done today revealing deficits in mobility and self care tasks due to quadriplegia. CIR recommended for follow up therapy.    Review of Systems  Constitutional: Positive for malaise/fatigue.  HENT: Negative for hearing loss and tinnitus.   Eyes: Positive for blurred vision. Negative for double vision.  Respiratory: Positive for shortness of breath. Negative for cough.   Cardiovascular: Negative for chest pain and palpitations.  Gastrointestinal: Negative for heartburn, nausea and vomiting.  Genitourinary: Negative for frequency, hematuria and urgency.  Musculoskeletal: Positive for back pain (chronic), joint pain (chronic hip and knee pain) and neck pain.  Skin: Negative for itching and rash.  Neurological: Positive for dizziness, tremors, sensory change, focal weakness, weakness and headaches.  Psychiatric/Behavioral: Positive for depression. The patient is nervous/anxious and has insomnia.       Past Medical History:  Diagnosis Date  . Herniated disc, cervical     History reviewed. No pertinent surgical history.    Family History  Problem Relation Age of Onset  . Hypertension Mother   . Lung cancer Father   . COPD Sister       Social History:  Lives with girlfriend. Girlfriend disabled due to back  problems. Works day labor at landfill. He  reports that he has been smoking Cigarettes--1/2PPD.   He has never used smokeless tobacco. He reports that he drinks alcohol--6 packs occasionally and a pint/week. He reports that he uses drugs, including Marijuana.    Allergies: No Known Allergies    Medications Prior to Admission  Medication Sig Dispense Refill  . acetaminophen (TYLENOL) 500 MG tablet Take 1,000 mg by mouth every 6 (six) hours as needed for mild pain.    . Aspirin-Salicylamide-Caffeine (BC HEADACHE POWDER PO) Take 2 packets by mouth every 4 (four) hours as needed (for pain).     Marland Kitchen ibuprofen (ADVIL,MOTRIN) 200 MG tablet Take 400 mg by mouth every 6 (six) hours as needed for headache or moderate pain.     . Multiple Vitamins-Minerals (MULTIVITAMINS THER. W/MINERALS) TABS Take 1 tablet by mouth daily.    . cyclobenzaprine (FLEXERIL) 5 MG tablet Take 1 tablet (5 mg total) by mouth at bedtime as needed for muscle spasms. (Patient not taking: Reported on 09/01/2016) 20 tablet 0  . methocarbamol (ROBAXIN) 500 MG tablet Take 1 tablet (500 mg total) by mouth 2 (two) times daily as needed for muscle spasms. (Patient not taking: Reported on 09/01/2016) 20 tablet 0  . PredniSONE 10 MG KIT 12 day dose pack po (Patient not taking: Reported on 09/01/2016) 1 kit 0  . traMADol (ULTRAM) 50 MG tablet Take 1 tablet (50 mg total) by mouth every 6 (six) hours as needed. (Patient not taking: Reported on 09/01/2016) 15 tablet 0    Home: [place] expects to be discharged to:: Private residence Living Arrangements: Spouse/significant other Available Help at  Discharge: Available PRN/intermittently Type of Home: Apartment Home Access: Stairs to enter Entrance Stairs-Number of Steps: 1 Home Layout: One level Bathroom Shower/Tub: Chiropodist: Standard Home Equipment: Cane - single point, Careers adviser History: Prior Function Level of Independence: Needs  assistance Gait / Transfers Assistance Needed: Pt able to amb short distances with SPC before requiring rest breaks secondary to back pain. ADL's / Homemaking Assistance Needed: Indep with bathing and feeding.  Comments: Pt drives Functional Status:  Mobility: Bed Mobility Overal bed mobility: Needs Assistance Bed Mobility: Rolling, Sidelying to Sit Rolling: +2 for physical assistance, Max assist Sidelying to sit: HOB elevated, +2 for physical assistance, Max assist General bed mobility comments: MaxA +2 for trunk lift/support and moving LEs  Transfers Overall transfer level: Needs assistance Equipment used: 2 person hand held assist Transfers: Sit to/from Stand, Stand Pivot Transfers Sit to Stand: +2 physical assistance, Max assist Stand pivot transfers: +2 physical assistance, Max assist General transfer comment: MaxA +2 for trunk support, bilat knee instability (R>L), and hip extension. Pt with decreased gait initiation, able to scoot a couple steps sideways to chair with maxA.       ADL: ADL Overall ADL's : Needs assistance/impaired Eating/Feeding: Total assistance, Bed level Grooming: Total assistance, Bed level Upper Body Bathing: Total assistance, Sitting Lower Body Bathing: Total assistance, +2 for physical assistance, Bed level Upper Body Dressing : Total assistance, Bed level Lower Body Dressing: Total assistance, Bed level Toilet Transfer: Maximal assistance, +2 for physical assistance, BSC Toileting- Clothing Manipulation and Hygiene: Total assistance, +2 for physical assistance, Sit to/from stand Functional mobility during ADLs: Maximal assistance, Rolling walker General ADL Comments: Pt very limited with adls.  Presents more like Central Cord Syndrome.  Pt with very weak trunk muscles and very weak RUE.  Pt able to stand on L LE but requires assist to block R knee tor remain standing.   Cognition: Cognition Overall Cognitive Status: No family/caregiver present to  determine baseline cognitive functioning Orientation Level: Oriented X4 Cognition Arousal/Alertness: Awake/alert Behavior During Therapy: WFL for tasks assessed/performed Overall Cognitive Status: No family/caregiver present to determine baseline cognitive functioning Area of Impairment: Awareness, Attention Current Attention Level: Sustained Awareness: Emergent General Comments: Unable to recall details of accident.   Blood pressure (!) 143/88, pulse 65, temperature 99.2 F (37.3 C), temperature source Oral, resp. rate 10, height 6' (1.829 m), weight 63.2 kg (139 lb 5.3 oz), SpO2 96 %. Physical Exam  Nursing note and vitals reviewed. Constitutional: He is oriented to person, place, and time. He appears well-developed and well-nourished.  HENT:  Head: Normocephalic and atraumatic.  Eyes: Conjunctivae are normal. Pupils are equal, round, and reactive to light.  Neck:  Immobilized by cervical collar  Cardiovascular: Normal rate and regular rhythm.   Respiratory: Effort normal and breath sounds normal. No respiratory distress. He has no wheezes.  GI: Soft. Bowel sounds are normal. He exhibits no distension. There is no tenderness.  Musculoskeletal: He exhibits no edema or tenderness.  Neurological: He is alert and oriented to person, place, and time.  Speech clear. Able to answer orientation questions without difficulty.  Decreased LT RUE>LUE and RLE>LLE with. RUE--tr to 0/5 prox to distal. LUE: 2/5 deltoid, biceps, wrist. Triceps 1-, HI 1.Marland Kitchen LLE: extensor tone and 3/5 HF, KE and 3- ADF/PF. RLE 1/5 HE,KE 0/5 ADF/PF. UE DTR';s 1+ and LE DTR's 3+  Skin: Skin is warm and dry.  Psychiatric: He has a normal mood and affect. His behavior is  normal. Thought content normal.    No results found for this or any previous visit (from the past 24 hour(s)). Dg Chest 1 View  Result Date: 08/31/2016 CLINICAL DATA:  56 year old male with fall. EXAM: CHEST 1 VIEW COMPARISON:  Chest radiograph dated  08/22/2016 FINDINGS: The lungs are clear. There is no pleural effusion or pneumothorax. The cardiac silhouette is within normal limits. Mild tortuosity of the thoracic aorta. No acute osseous pathology. IMPRESSION: No active disease. Electronically Signed   By: Anner Crete M.D.   On: 08/31/2016 22:52   Dg Pelvis 1-2 Views  Result Date: 08/31/2016 CLINICAL DATA:  56 year old male with fall. EXAM: PELVIS - 1-2 VIEW COMPARISON:  Lumbar spine radiograph dated 03/20/2014 FINDINGS: There is no acute fracture or dislocation. Mild osteopenia. No arthritic changes. The soft tissues appear unremarkable. IMPRESSION: No acute/traumatic osseous pathology. Electronically Signed   By: Anner Crete M.D.   On: 08/31/2016 22:53   Ct Head Wo Contrast  Result Date: 08/31/2016 CLINICAL DATA:  56 y/o M; found lying on round, altered mental status, intoxication, right arm weakness. EXAM: CT HEAD WITHOUT CONTRAST CT CERVICAL SPINE WITHOUT CONTRAST TECHNIQUE: Multidetector CT imaging of the head and cervical spine was performed following the standard protocol without intravenous contrast. Multiplanar CT image reconstructions of the cervical spine were also generated. COMPARISON:  08/22/2016 CT head and cervical spine. FINDINGS: CT HEAD FINDINGS Brain: No evidence of acute infarction, hemorrhage, hydrocephalus, extra-axial collection or significant mass effect. Stable 7 mm dense focus along the right tentorium, probably a meningioma (series 9, image 44). Vascular: No hyperdense vessel or unexpected calcification. Skull: Normal. Negative for fracture or focal lesion. Sinuses/Orbits: No acute finding. Other: None. CT CERVICAL SPINE FINDINGS Alignment: Normal. Skull base and vertebrae: Nondisplaced acute fracture through the right C3 foramen transversarium (series 7, image 35). Nondisplaced acute fracture of the right inferior articular facet of the C7 vertebral body (series 12, image 25). Acute fracture through the anterior  margin of the superior endplate of the C7 vertebral body (series 13, image 31). Anterior displacement of a bridging osteophyte between the C3 and C4 vertebral bodies and prevertebral edema from C2 -C5 may represent nondisplaced fracture through the C3-4 disc (series image 33). No loss of vertebral body height.  No dislocation. Soft tissues and spinal canal: 7 mm nodule within the right lobe of thyroid. No canal hematoma identified. Disc levels: Stable cervical spondylosis without high-grade bony canal stenosis. Upper chest: Negative. Other: Negative. IMPRESSION: 1. Nondisplaced acute fracture through the right C3 foramen transversarium. CT angiogram of the neck is recommended to evaluate for vascular injury. 2. Nondisplaced acute fracture of the right inferior articular facet of the C7 vertebral body. 3. Minimally displaced acute fracture through the anterior margin of the superior endplate of the C7 vertebral body. 4. Anterior displacement of a bridging osteophyte between the C3 and C4 vertebral bodies and prevertebral edema from C2 -C5 may represent fracture through the C3-4 disc. 5. No dislocation or malalignment of the cervical spine. 6. No acute intracranial abnormality. These results were called by telephone at the time of interpretation on 08/31/2016 at 11:04 pm to Dr. Leo Grosser , who verbally acknowledged these results. Electronically Signed   By: Kristine Garbe M.D.   On: 08/31/2016 23:04   Ct Angio Neck W And/or Wo Contrast  Result Date: 09/01/2016 CLINICAL DATA:  Cervical spine fractures after fall. EXAM: CT ANGIOGRAPHY NECK TECHNIQUE: Multidetector CT imaging of the neck was performed using the standard protocol during bolus  administration of intravenous contrast. Multiplanar CT image reconstructions and MIPs were obtained to evaluate the vascular anatomy. Carotid stenosis measurements (when applicable) are obtained utilizing NASCET criteria, using the distal internal carotid diameter as  the denominator. CONTRAST:  100 mL Isovue 370 IV COMPARISON:  Cervical spine CT 08/31/2016 FINDINGS: Aortic arch: Mild atherosclerotic calcification of the aortic arch. There is a normal 3 vessel branching pattern. The visualized proximal subclavian arteries are normal. No dissection or aneurysm. Central pulmonary arteries are normal. Right carotid system: There is minimal calcified plaque of the proximal internal carotid artery without hemodynamically significant stenosis. No dissection or other acute injury. Left carotid system: Minimal atherosclerotic plaque at the carotid bifurcation proximal left internal carotid artery without hemodynamically significant stenosis. No dissection or other acute abnormality. Vertebral arteries: Both vertebral artery origins are widely patent. The left vertebral artery is dominant. There is very mild short segment narrowing of the right vertebral artery at the level of the right transverse foramen fracture of C3. This is best demonstrated on series 12, image 66 and series 8 images 203-211. The remainder of the artery is normal. The left vertebral artery is normal. Skeleton: Fractures of the right C3 transverse foramen, C7 right inferior facet and anterior superior C7 corner are again noted. No static subluxation. Other neck: The nasopharynx is clear. The oropharynx and hypopharynx are normal. The epiglottis is normal. The supraglottic larynx, glottis and subglottic larynx are normal. No retropharyngeal collection. The parapharyngeal spaces are preserved. The parotid and submandibular glands are normal. No sialolithiasis or salivary ductal dilatation. The thyroid gland is normal. There is no cervical lymphadenopathy. Upper chest: Clear IMPRESSION: 1. Minimal luminal narrowing of the right vertebral artery at the level of the right C3 transverse foramen fracture may indicate a grade 1 intimal injury. 2. Otherwise normal CTA of the neck. 3. Multiple cervical spine fractures, as  previously characterized on concomitant cervical spine CT. Electronically Signed   By: Ulyses Jarred M.D.   On: 09/01/2016 00:31   Ct Chest W Contrast  Result Date: 09/01/2016 CLINICAL DATA:  56 year old male with fall. Patient complaining of neck and back and leg pain. EXAM: CT CHEST, ABDOMEN, AND PELVIS WITH CONTRAST TECHNIQUE: Multidetector CT imaging of the chest, abdomen and pelvis was performed following the standard protocol during bolus administration of intravenous contrast. CONTRAST:  100 cc Isovue 370 COMPARISON:  chest and pelvic radiographs dated 08/31/2016 FINDINGS: CT CHEST FINDINGS Cardiovascular: There is no cardiomegaly or pericardial effusion. There is mild atherosclerotic calcification of the thoracic aorta. No aneurysmal dilatation or evidence of dissection. The visualized origins of the great vessels of the aortic arch appear patent. The central pulmonary arteries are patent as well. Mediastinum/Nodes: There is no hilar or mediastinal adenopathy. Mild thickened appearance of the esophagus likely related to underdistention. The thyroid gland is grossly unremarkable. Lungs/Pleura: Minimal bibasilar dependent atelectatic changes of the lungs. There is no focal consolidation, pleural effusion, or pneumothorax. The central airways are patent. Musculoskeletal: There is no axillary adenopathy. The chest wall soft tissues appear unremarkable. No acute fracture. CT ABDOMEN PELVIS FINDINGS No intra-abdominal free air or free fluid. Hepatobiliary: No focal liver abnormality is seen. No gallstones, gallbladder wall thickening, or biliary dilatation. Pancreas: Unremarkable. No pancreatic ductal dilatation or surrounding inflammatory changes. Spleen: Normal in size without focal abnormality. Adrenals/Urinary Tract: A 1.5 cm cyst in the inferior pole of the right kidney appears minimally complex. An area of nodular appearing density within the inferior portion of the cyst (series 7 image  79 and series 4  image 25) is likely artifactual. An associated mural nodule is not entirely excluded. Follow-up with ultrasound recommended to better evaluate the cyst and exclude solid component. The kidneys are otherwise unremarkable. There is symmetric uptake and excretion of contrast by kidneys. The urinary bladder is distended and unremarkable. Stomach/Bowel: There is no evidence of bowel obstruction or active inflammation. Normal appendix. Vascular/Lymphatic: There is mild aortoiliac atherosclerotic disease. The origins of the celiac axis, SMA, IMA as well as the origins of the renal arteries are patent. The SMV, splenic vein, and the main portal vein is patent. No portal venous gas identified. There is no adenopathy. Reproductive: The prostate and seminal vesicles are grossly unremarkable. Other: None Musculoskeletal:  No acute fracture. IMPRESSION: 1. No acute/traumatic intrathoracic, abdominal, or pelvic pathology. 2. A 1.5 cm right renal cyst. Nonemergent ultrasound is recommended for further characterization of this cyst. Electronically Signed   By: Anner Crete M.D.   On: 09/01/2016 00:26   Ct Cervical Spine Wo Contrast  Result Date: 08/31/2016 CLINICAL DATA:  56 y/o M; found lying on round, altered mental status, intoxication, right arm weakness. EXAM: CT HEAD WITHOUT CONTRAST CT CERVICAL SPINE WITHOUT CONTRAST TECHNIQUE: Multidetector CT imaging of the head and cervical spine was performed following the standard protocol without intravenous contrast. Multiplanar CT image reconstructions of the cervical spine were also generated. COMPARISON:  08/22/2016 CT head and cervical spine. FINDINGS: CT HEAD FINDINGS Brain: No evidence of acute infarction, hemorrhage, hydrocephalus, extra-axial collection or significant mass effect. Stable 7 mm dense focus along the right tentorium, probably a meningioma (series 9, image 44). Vascular: No hyperdense vessel or unexpected calcification. Skull: Normal. Negative for fracture  or focal lesion. Sinuses/Orbits: No acute finding. Other: None. CT CERVICAL SPINE FINDINGS Alignment: Normal. Skull base and vertebrae: Nondisplaced acute fracture through the right C3 foramen transversarium (series 7, image 35). Nondisplaced acute fracture of the right inferior articular facet of the C7 vertebral body (series 12, image 25). Acute fracture through the anterior margin of the superior endplate of the C7 vertebral body (series 13, image 31). Anterior displacement of a bridging osteophyte between the C3 and C4 vertebral bodies and prevertebral edema from C2 -C5 may represent nondisplaced fracture through the C3-4 disc (series image 33). No loss of vertebral body height.  No dislocation. Soft tissues and spinal canal: 7 mm nodule within the right lobe of thyroid. No canal hematoma identified. Disc levels: Stable cervical spondylosis without high-grade bony canal stenosis. Upper chest: Negative. Other: Negative. IMPRESSION: 1. Nondisplaced acute fracture through the right C3 foramen transversarium. CT angiogram of the neck is recommended to evaluate for vascular injury. 2. Nondisplaced acute fracture of the right inferior articular facet of the C7 vertebral body. 3. Minimally displaced acute fracture through the anterior margin of the superior endplate of the C7 vertebral body. 4. Anterior displacement of a bridging osteophyte between the C3 and C4 vertebral bodies and prevertebral edema from C2 -C5 may represent fracture through the C3-4 disc. 5. No dislocation or malalignment of the cervical spine. 6. No acute intracranial abnormality. These results were called by telephone at the time of interpretation on 08/31/2016 at 11:04 pm to Dr. Leo Grosser , who verbally acknowledged these results. Electronically Signed   By: Kristine Garbe M.D.   On: 08/31/2016 23:04   Mr Cervical Spine Wo Contrast  Result Date: 09/01/2016 CLINICAL DATA:  Found down.  Upper extremity weakness. EXAM: MRI CERVICAL  SPINE WITHOUT CONTRAST TECHNIQUE: Multiplanar, multisequence MR  imaging of the cervical spine was performed. No intravenous contrast was administered. COMPARISON:  CTA CTA of the neck 08/31/2016. CT of cervical spine 08/31/2016. FINDINGS: Alignment: AP alignment is anatomic. There straightening of the normal cervical lordosis. Vertebrae: Anterior for fractures C3 C7 are better appreciated by CT. C7 posterior element fractures on the right are also better appreciated on CT. Multilevel endplate degenerative changes most evident at C5-6 and C6-7 with fatty replacement. Cord: There is focal central T2 signal abnormality in the cord at the C3 and C3-4 level. Signal changes involve the gray matter without significant expansion of was. There is some peripheral sparing. No other focal cord signal changes are evident. Posterior Fossa, vertebral arteries, paraspinal tissues: Prevertebral edema and probable hemorrhage is evident C1 through C5-6. A fluid level is present on axial imaging. The vertebral artery intact. Craniocervical junction is normal. Prominent adenoid tissue is noted. Disc levels: C2-3: Moderate foraminal narrowing is worse on the left secondary to uncovertebral and facet disease. C3-4: A broad-based disc osteophyte complex partially effaces the ventral CSF but does not compress the cord. Severe right and moderate left foraminal narrowing is due to uncovertebral disease. C4-5: A broad-based disc osteophyte complex is present. Severe right and moderate left foraminal narrowing is present. There is partial effacement ventral CSF without cord compromise. C5-6: A broad-based disc osteophyte complex is present. There is effacement of ventral CSF. Moderate central and severe bilateral foraminal narrowing is evident. C6-7: A broad-based disc osteophyte complex is asymmetric to the left. This effaces the ventral CSF. Moderate central and severe bilateral foraminal stenosis is present. C7-T1: A broad-based disc  protrusion is present. Facet hypertrophy contributes moderate foraminal stenosis bilaterally. There is partial effacement of the ventral CSF. IMPRESSION: 1. 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 The cord is not expanded, making tumor less likely. The pattern is atypical for a demyelinating process. 2. Anterior corner fractures at C3 and C7 with prevertebral edema and hemorrhage. 3. Posterior element fractures at C7 and C3 are less well appreciated on this study. 4. Multilevel spondylosis of the cervical spine as described. 5. Mild central canal stenosis without cord compromise at the C3-4 level. 6. The most significant central canal stenosis is at C5-6 without focal cord signal abnormality at this level. These results were called by telephone at the time of interpretation on 09/01/2016 at 7:00 am to Dr. Ashok Pall , who verbally acknowledged these results. Electronically Signed   By: San Morelle M.D.   On: 09/01/2016 07:09   Ct Abdomen Pelvis W Contrast  Result Date: 09/01/2016 CLINICAL DATA:  56 year old male with fall. Patient complaining of neck and back and leg pain. EXAM: CT CHEST, ABDOMEN, AND PELVIS WITH CONTRAST TECHNIQUE: Multidetector CT imaging of the chest, abdomen and pelvis was performed following the standard protocol during bolus administration of intravenous contrast. CONTRAST:  100 cc Isovue 370 COMPARISON:  chest and pelvic radiographs dated 08/31/2016 FINDINGS: CT CHEST FINDINGS Cardiovascular: There is no cardiomegaly or pericardial effusion. There is mild atherosclerotic calcification of the thoracic aorta. No aneurysmal dilatation or evidence of dissection. The visualized origins of the great vessels of the aortic arch appear patent. The central pulmonary arteries are patent as well. Mediastinum/Nodes: There is no hilar or mediastinal adenopathy. Mild thickened appearance of the esophagus likely  related to underdistention. The thyroid gland is grossly unremarkable. Lungs/Pleura: Minimal bibasilar dependent atelectatic changes of the lungs. There is no  focal consolidation, pleural effusion, or pneumothorax. The central airways are patent. Musculoskeletal: There is no axillary adenopathy. The chest wall soft tissues appear unremarkable. No acute fracture. CT ABDOMEN PELVIS FINDINGS No intra-abdominal free air or free fluid. Hepatobiliary: No focal liver abnormality is seen. No gallstones, gallbladder wall thickening, or biliary dilatation. Pancreas: Unremarkable. No pancreatic ductal dilatation or surrounding inflammatory changes. Spleen: Normal in size without focal abnormality. Adrenals/Urinary Tract: A 1.5 cm cyst in the inferior pole of the right kidney appears minimally complex. An area of nodular appearing density within the inferior portion of the cyst (series 7 image 79 and series 4 image 25) is likely artifactual. An associated mural nodule is not entirely excluded. Follow-up with ultrasound recommended to better evaluate the cyst and exclude solid component. The kidneys are otherwise unremarkable. There is symmetric uptake and excretion of contrast by kidneys. The urinary bladder is distended and unremarkable. Stomach/Bowel: There is no evidence of bowel obstruction or active inflammation. Normal appendix. Vascular/Lymphatic: There is mild aortoiliac atherosclerotic disease. The origins of the celiac axis, SMA, IMA as well as the origins of the renal arteries are patent. The SMV, splenic vein, and the main portal vein is patent. No portal venous gas identified. There is no adenopathy. Reproductive: The prostate and seminal vesicles are grossly unremarkable. Other: None Musculoskeletal:  No acute fracture. IMPRESSION: 1. No acute/traumatic intrathoracic, abdominal, or pelvic pathology. 2. A 1.5 cm right renal cyst. Nonemergent ultrasound is recommended for further characterization of this cyst.  Electronically Signed   By: Anner Crete M.D.   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 Does the need for close, 24 hr/day medical supervision in concert with the patient's rehab needs make it unreasonable for this patient to be served in a less intensive setting? Yes 2. Co-Morbidities requiring supervision/potential complications: etoh abuse, neurogenic bladder and bowel. Pain and spasticity mgt 3. Due to bladder management, bowel management, safety, skin/wound care, disease management, medication administration, pain management and patient education, does the patient require 24 hr/day rehab nursing? Yes 4. Does the patient require coordinated care of a physician, rehab nurse, PT (1-2 hrs/day, 5 days/week) and OT (1-2 hrs/day, 5 days/week), potentially SLP 1-2 hrs/day 5 days/week.  to address physical and functional deficits in the context of the above medical diagnosis(es)? Yes Addressing deficits in the following areas: balance, endurance, locomotion, strength, transferring, bowel/bladder control, bathing, dressing, feeding, grooming, toileting, cognition and psychosocial support 5. Can the patient actively participate in an intensive therapy program of at least 3 hrs of therapy per day at least 5 days per week? Yes 6. The potential for patient to make measurable gains while on inpatient rehab is excellent 7. Anticipated functional outcomes upon discharge from inpatient rehab are supervision, min assist and mod assist  with PT, supervision, min assist and mod assist with OT, modified independent with SLP. 8. Estimated rehab length of stay to reach the above functional goals is: 20-25 days 9. Does the patient have adequate social supports and living environment to accommodate these discharge functional goals? Yes and Potentially 10. Anticipated D/C setting: Home 11. Anticipated  post D/C treatments: HH therapy and Outpatient therapy 12. Overall Rehab/Functional Prognosis: excellent  RECOMMENDATIONS: This patient's condition is appropriate for continued rehabilitative care in the following setting: CIR Patient has agreed to participate in recommended program. Yes Note that insurance prior authorization may be required for reimbursement for recommended  care.  Comment: Rehab Admissions Coordinator to follow up.  Thanks,  Meredith Staggers, MD, [place], [place] 09/02/2016

## 2016-09-02 NOTE — Evaluation (Signed)
Physical Therapy Evaluation Patient Details Name: Patrick Romero MRN: [id number] DOB: [birth year] Today's Date: 09/02/2016   History of Present Illness  Pt is 56 y.o. male admitted to ED on 08/31/16. 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 PMH includes chronic neck and LBP.    Clinical Impression  Pt presents to PT with increased pain, decreased strength (R>L), decreased sensation (R>L), and an overall decrease in functional mobility secondary to above. PTA, pt states he was mod ind with SPC for short distance community amb and indep with ADLs (limited by history of chronic back pain). Pt has support of fiance at home. Today, pt required maxA +2 for bed mobility and stand pivot to chair. Educ on current condition, cervical precautions, and importance of mobility. Pt pleasant and motivated to work with therapy. Pt would benefit from continued acute PT to maximize functional mobility and independence.    Follow Up Recommendations CIR    Equipment Recommendations  Other (comment) (Pending pt progression)    Recommendations for Other Services Rehab consult     Precautions / Restrictions Precautions Precautions: Cervical Required Braces or Orthoses: Cervical Brace Cervical Brace: Hard collar Restrictions Weight Bearing Restrictions: No Other Position/Activity Restrictions: Pt with very weak core      Mobility  Bed Mobility Overal bed mobility: Needs Assistance Bed Mobility: Rolling;Sidelying to Sit Rolling: +2 for physical assistance;Max assist Sidelying to sit: HOB elevated;+2 for physical assistance;Max assist       General bed mobility comments: MaxA +2 for trunk lift/support and moving LEs    Transfers Overall transfer level: Needs assistance Equipment used: 2 person hand held assist Transfers: Sit to/from Stand;Stand Pivot Transfers Sit to Stand: +2 physical assistance;Max assist Stand pivot transfers: +2 physical assistance;Max assist       General transfer comment: MaxA +2 for trunk support, bilat knee instability (R knee buckle), and hip extension. Pt with decreased gait initiation, able to take a couple steps sideways to chair with maxA.   Ambulation/Gait                Stairs            Wheelchair Mobility    Modified Rankin (Stroke Patients Only)       Balance Overall balance assessment: Needs assistance Sitting-balance support: Bilateral upper extremity supported;Feet supported Sitting balance-Leahy Scale: Zero     Standing balance support: No upper extremity supported Standing balance-Leahy Scale: Zero                               Pertinent Vitals/Pain Pain Assessment: Faces Faces Pain Scale: Hurts little more Pain Location: Bilat shoulders, L hand, BUE, BLE Pain Descriptors / Indicators: Tingling;Numbness Pain Intervention(s): Limited activity within patient's tolerance;Repositioned;Monitored during session;Premedicated before session    Home Living Family/patient expects to be discharged to:: Private residence Living Arrangements: Spouse/significant other Available Help at Discharge: Available PRN/intermittently Type of Home: Apartment Home Access: Stairs to enter   [employer] of Steps: 1 Home Layout: One level Home Equipment: Cane - single point;Shower seat      Prior Function Level of Independence: Needs assistance   Gait / Transfers Assistance Needed: Pt able to amb short distances with [facility]  before requiring rest breaks secondary to back pain.  ADL's / Homemaking Assistance Needed: Indep with bathing and feeding.   Comments: Pt drives     Hand Dominance   Dominant Hand: Right     Extremity/Trunk Assessment   Upper Extremity Assessment Upper Extremity Assessment: RUE deficits/detail;LUE deficits/detail RUE Deficits / Details: Shoulder 1/5, biceps 1/5, triceps 1/5, hand 1/5.   RUE Sensation: decreased light touch;decreased proprioception RUE Coordination: decreased fine motor;decreased gross motor LUE Deficits / Details: Shoulder 2/5, biceps 2+/5, triceps 2+/5, hand flex 2/5, finger extension 2/5.   LUE Sensation: decreased light touch;decreased proprioception LUE Coordination: decreased fine motor;decreased gross motor    Lower Extremity Assessment Lower Extremity Assessment: Defer to PT evaluation RLE Deficits / Details: R hip flex 2+, knee flex/ext 2+, ankle PF/DF 1; impaired discrimination of light touch  RLE Sensation: decreased light touch;decreased proprioception LLE Deficits / Details: L hip flex 3, knee flex/ext 3, ankle PF/DF 2 LLE Sensation: decreased light touch;decreased proprioception    Cervical / Trunk Assessment Cervical / Trunk Assessment: Other exceptions Cervical / Trunk Exceptions: Impaired light and crude touch sensation in thoracic region  Communication   Communication: No difficulties  Cognition Arousal/Alertness: Awake/alert Behavior During Therapy: WFL for tasks assessed/performed Overall Cognitive Status: No family/caregiver present to determine baseline cognitive functioning Area of Impairment: Awareness;Attention   Current Attention Level: Sustained       Awareness: Emergent   General Comments: Unable to recall details of accident.     General Comments General comments (skin integrity, edema, etc.): Pt c/o dizziness with sitting and standing, which subsided with rest; SBP remained 140-150s.     Exercises     Assessment/Plan    PT Assessment Patient needs continued PT services  PT Problem List Decreased strength;Decreased range of motion;Decreased activity tolerance;Decreased mobility;Decreased balance;Decreased  cognition;Impaired sensation;Decreased skin integrity;Pain       PT Treatment Interventions Gait training;Stair training;DME instruction;Therapeutic activities;Therapeutic exercise;Balance training;Functional mobility training;Neuromuscular re-education;Patient/family education    PT Goals (Current goals can be found in the Care Plan section)  Acute Rehab PT Goals Patient Stated Goal: Return home PT Goal Formulation: With patient Time For Goal Achievement: 09/16/16 Potential to Achieve Goals: Good    Frequency Min 3X/week   Barriers to discharge Decreased caregiver support      Co-evaluation PT/OT/SLP Co-Evaluation/Treatment: Yes Reason for Co-Treatment: Complexity of the patient's impairments (multi-system involvement) PT goals addressed during session: Mobility/safety with mobility OT goals addressed during session: ADL's and self-care       End of Session Equipment Utilized During Treatment: Gait belt;Cervical collar Activity Tolerance: Patient tolerated treatment well Patient left: in chair;with call bell/phone within reach Nurse Communication: Mobility status PT Visit Diagnosis: Muscle weakness (generalized) (M62.81);Other symptoms and signs involving the nervous system (R29.898)         Time: [id number] PT Time Calculation (min) (ACUTE ONLY): 32 min   Charges:   PT Evaluation $PT Eval Moderate Complexity: 1 Procedure     PT G Codes:       Enis Gash, SPT [phone]  Mabeline Caras 09/02/2016, 1:49 PM

## 2016-09-02 NOTE — Evaluation (Signed)
Occupational Therapy Evaluation Patient Details Name: Patrick Romero MRN: [id number] DOB: [birth year] Today's Date: 09/02/2016    History of Present Illness Pt is 57 y.o. 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 PMH includes chronic neck and LBP.    Clinical Impression   Pt seen for above diagnosis and has the deficits listed below. Pt would benefit from cont OT to increase UE strength, adls and adl transfers so he can eventually d/c home with his girlfriend.  Pt currently dependent with most adls and max assist for most mobility. Pt will need intense rehab before returning home and pt unsure if his girlfriend can be around at all times.  Will continue to follow.    Follow Up Recommendations  CIR;Supervision/Assistance - 24 hour    Equipment Recommendations  Other (comment) (TBD)    Recommendations for Other Services Rehab consult     Precautions / Restrictions Precautions Precautions: Cervical Required Braces or Orthoses: Cervical Brace Cervical Brace: Hard collar Restrictions Weight Bearing Restrictions: No Other Position/Activity Restrictions: Pt with very weak core      Mobility Bed Mobility Overal bed mobility: Needs Assistance Bed Mobility: Rolling;Sidelying to Sit Rolling: +2 for physical assistance;Max assist Sidelying to sit: HOB elevated;+2 for physical assistance;Max assist       General bed mobility comments: MaxA +2 for trunk lift/support and moving LEs   Transfers Overall transfer level: Needs assistance Equipment used: 2 person hand held assist Transfers: Sit to/from Stand;Stand Pivot Transfers Sit to Stand: +2 physical assistance;Max assist Stand pivot transfers: +2 physical assistance;Max assist       General transfer comment: MaxA +2 for trunk support, bilat knee instability (R>L), and hip extension. Pt with decreased gait initiation, able to  scoot a couple steps sideways to chair with maxA.     Balance Overall balance assessment: Needs assistance Sitting-balance support: Bilateral upper extremity supported;Feet supported Sitting balance-Leahy Scale: Zero     Standing balance support: No upper extremity supported Standing balance-Leahy Scale: Zero                              ADL Overall ADL's : Needs assistance/impaired Eating/Feeding: Total assistance;Bed level   Grooming: Total assistance;Bed level   Upper Body Bathing: Total assistance;Sitting   Lower Body Bathing: Total assistance;+2 for physical assistance;Bed level   Upper Body Dressing : Total assistance;Bed level   Lower Body Dressing: Total assistance;Bed level   Toilet Transfer: Maximal assistance;+2 for physical assistance;BSC   Toileting- Clothing Manipulation and Hygiene: Total assistance;+2 for physical assistance;Sit to/from stand       Functional mobility during ADLs: Maximal assistance;Rolling walker General ADL Comments: Pt very limited with adls.  Presents more like Central Cord Syndrome.  Pt with very weak trunk muscles and very weak RUE.  Pt able to stand on L LE but requires assist to block R knee tor remain standing.      Vision Baseline Vision/History: No visual deficits Patient Visual Report: No change from baseline Vision Assessment?: No apparent visual deficits     Perception     Praxis      Pertinent Vitals/Pain Pain Assessment: Faces Faces Pain Scale: Hurts little more Pain Location: Bilat shoulders, L hand, BUE, BLE Pain Descriptors / Indicators: Tingling;Numbness Pain Intervention(s): Limited activity within patient's tolerance;Repositioned;Monitored during session;Premedicated before session     Hand Dominance Right  Extremity/Trunk Assessment Upper Extremity Assessment Upper Extremity Assessment: RUE deficits/detail;LUE deficits/detail RUE Deficits / Details: Shoulder 1/5, biceps 1/5, triceps 1/5,  hand 1/5.   RUE Sensation: decreased light touch;decreased proprioception RUE Coordination: decreased fine motor;decreased gross motor LUE Deficits / Details: Shoulder 2/5, biceps 2+/5, triceps 2+/5, hand flex 2/5, finger extension 2/5.   LUE Sensation: decreased light touch;decreased proprioception LUE Coordination: decreased fine motor;decreased gross motor   Lower Extremity Assessment Lower Extremity Assessment: Defer to PT evaluation RLE Deficits / Details: R hip flex 2+, knee flex/ext 2+, ankle PF/DF 1; impaired discrimination of light touch  RLE Sensation: decreased light touch;decreased proprioception LLE Deficits / Details: L hip flex 3, knee flex/ext 3, ankle PF/DF 2 LLE Sensation: decreased light touch;decreased proprioception   Cervical / Trunk Assessment Cervical / Trunk Assessment: Other exceptions Cervical / Trunk Exceptions: Impaired light and crude touch sensation in thoracic region   Communication Communication Communication: No difficulties   Cognition Arousal/Alertness: Awake/alert Behavior During Therapy: WFL for tasks assessed/performed Overall Cognitive Status: No family/caregiver present to determine baseline cognitive functioning Area of Impairment: Awareness;Attention   Current Attention Level: Sustained       Awareness: Emergent   General Comments: Unable to recall details of accident.    General Comments  Pt c/o dizziness with sitting and standing, which subsided with rest; SBP remained 140-150s.     Exercises       Shoulder Instructions      Home Living Family/patient expects to be discharged to:: Private residence Living Arrangements: Spouse/significant other Available Help at Discharge: Available PRN/intermittently Type of Home: Apartment Home Access: Stairs to enter Entrance Stairs-Number of Steps: 1   Home Layout: One level     Bathroom Shower/Tub: Tub/shower unit Shower/tub characteristics: Architectural technologist: Standard      Home Equipment: Cane - single point;Shower seat          Prior Functioning/Environment Level of Independence: Needs assistance  Gait / Transfers Assistance Needed: Pt able to amb short distances with SPC before requiring rest breaks secondary to back pain. ADL's / Homemaking Assistance Needed: Indep with bathing and feeding.    Comments: Pt drives        OT Problem List: Decreased strength;Decreased range of motion;Decreased activity tolerance;Impaired balance (sitting and/or standing);Decreased coordination;Decreased knowledge of use of DME or AE;Decreased knowledge of precautions;Impaired UE functional use;Impaired tone;Impaired sensation;Pain      OT Treatment/Interventions: Self-care/ADL training;DME and/or AE instruction;Therapeutic activities;Therapeutic exercise;Patient/family education    OT Goals(Current goals can be found in the care plan section) Acute Rehab OT Goals Patient Stated Goal: Return home OT Goal Formulation: With patient Time For Goal Achievement: 09/16/16 Potential to Achieve Goals: Good  OT Frequency: Min 3X/week   Barriers to D/C: Decreased caregiver support  wife is not well       Co-evaluation PT/OT/SLP Co-Evaluation/Treatment: Yes Reason for Co-Treatment: Complexity of the patient's impairments (multi-system involvement) PT goals addressed during session: Mobility/safety with mobility OT goals addressed during session: ADL's and self-care      End of Session Nurse Communication: Mobility status  Activity Tolerance: Patient tolerated treatment well Patient left: in chair;with call bell/phone within reach  OT Visit Diagnosis: Other abnormalities of gait and mobility (R26.89)                ADL either performed or assessed with clinical judgement  Time: [id number] OT Time Calculation (min): 32 min Charges:  OT General Charges $OT Visit: 1 Procedure OT Evaluation $OT Eval Moderate Complexity: 1 Procedure  G-Codes:     Jinger Neighbors,  OTR/L  Glenford Peers 09/02/2016, 11:09 AM  [phone]

## 2016-09-02 NOTE — Progress Notes (Signed)
Subjective: Less neuropathic pain on Lyrica, ate breakfast with assist  Objective: Vital signs in last 24 hours: Temp:  [97.6 F (36.4 C)-99.4 F (37.4 C)] 99.2 F (37.3 C) (02/23 0800) Pulse Rate:  [63-114] 81 (02/23 0900) Resp:  [11-20] 16 (02/23 0900) BP: (114-155)/(67-100) 149/95 (02/23 0900) SpO2:  [92 %-100 %] 98 % (02/23 0900) Last BM Date:  (PTA)  Intake/Output from previous day: 02/22 0701 - 02/23 0700 In: 931.5 [P.O.:270; I.V.:541.5; IV Piggyback:120] Out: 2500 [Urine:2500] Intake/Output this shift: Total I/O In: 20 [I.V.:20] Out: 150 [Urine:150]  General appearance: alert and cooperative Neck: Collar Resp: clear to auscultation bilaterally Cardio: regular rate and rhythm GI: soft, NT, ND Extremities: claves soft Neuro: RUE and LLE now 3/5 bit no grip. LUE and LLE 1/5 Lab Results: CBC   Recent Labs  08/31/16 2251 08/31/16 2302 09/01/16 0838  WBC 9.9  --  9.7  HGB 8.4* 10.5* 9.0*  HCT 25.9* 31.0* 28.1*  PLT 279  --  268   BMET  Recent Labs  08/31/16 2251 08/31/16 2302 09/01/16 0838  NA 138 141 137  K 3.5 3.6 4.1  CL 108 105 107  CO2 22  --  14*  GLUCOSE 86 83 63*  BUN 25* 25* 20  CREATININE 1.04 1.40* 1.00  CALCIUM 8.7*  --  8.2*   PT/INR  Recent Labs  08/31/16 2251  LABPROT 13.0  INR 0.98   ABG No results for input(s): PHART, HCO3 in the last 72 hours.  Invalid input(s): PCO2, PO2  Studies/Results: Dg Chest 1 View  Result Date: 08/31/2016 CLINICAL DATA:  56 year old male with fall. EXAM: CHEST 1 VIEW COMPARISON:  Chest radiograph dated 08/22/2016 FINDINGS: The lungs are clear. There is no pleural effusion or pneumothorax. The cardiac silhouette is within normal limits. Mild tortuosity of the thoracic aorta. No acute osseous pathology. IMPRESSION: No active disease. Electronically Signed   By: Anner Crete M.D.   On: 08/31/2016 22:52   Dg Pelvis 1-2 Views  Result Date: 08/31/2016 CLINICAL DATA:  56 year old male with  fall. EXAM: PELVIS - 1-2 VIEW COMPARISON:  Lumbar spine radiograph dated 03/20/2014 FINDINGS: There is no acute fracture or dislocation. Mild osteopenia. No arthritic changes. The soft tissues appear unremarkable. IMPRESSION: No acute/traumatic osseous pathology. Electronically Signed   By: Anner Crete M.D.   On: 08/31/2016 22:53   Ct Head Wo Contrast  Result Date: 08/31/2016 CLINICAL DATA:  56 y/o M; found lying on round, altered mental status, intoxication, right arm weakness. EXAM: CT HEAD WITHOUT CONTRAST CT CERVICAL SPINE WITHOUT CONTRAST TECHNIQUE: Multidetector CT imaging of the head and cervical spine was performed following the standard protocol without intravenous contrast. Multiplanar CT image reconstructions of the cervical spine were also generated. COMPARISON:  08/22/2016 CT head and cervical spine. FINDINGS: CT HEAD FINDINGS Brain: No evidence of acute infarction, hemorrhage, hydrocephalus, extra-axial collection or significant mass effect. Stable 7 mm dense focus along the right tentorium, probably a meningioma (series 9, image 44). Vascular: No hyperdense vessel or unexpected calcification. Skull: Normal. Negative for fracture or focal lesion. Sinuses/Orbits: No acute finding. Other: None. CT CERVICAL SPINE FINDINGS Alignment: Normal. Skull base and vertebrae: Nondisplaced acute fracture through the right C3 foramen transversarium (series 7, image 35). Nondisplaced acute fracture of the right inferior articular facet of the C7 vertebral body (series 12, image 25). Acute fracture through the anterior margin of the superior endplate of the C7 vertebral body (series 13, image 31). Anterior displacement of a bridging osteophyte between  the C3 and C4 vertebral bodies and prevertebral edema from C2 -C5 may represent nondisplaced fracture through the C3-4 disc (series image 33). No loss of vertebral body height.  No dislocation. Soft tissues and spinal canal: 7 mm nodule within the right lobe of  thyroid. No canal hematoma identified. Disc levels: Stable cervical spondylosis without high-grade bony canal stenosis. Upper chest: Negative. Other: Negative. IMPRESSION: 1. Nondisplaced acute fracture through the right C3 foramen transversarium. CT angiogram of the neck is recommended to evaluate for vascular injury. 2. Nondisplaced acute fracture of the right inferior articular facet of the C7 vertebral body. 3. Minimally displaced acute fracture through the anterior margin of the superior endplate of the C7 vertebral body. 4. Anterior displacement of a bridging osteophyte between the C3 and C4 vertebral bodies and prevertebral edema from C2 -C5 may represent fracture through the C3-4 disc. 5. No dislocation or malalignment of the cervical spine. 6. No acute intracranial abnormality. These results were called by telephone at the time of interpretation on 08/31/2016 at 11:04 pm to Dr. Leo Grosser , who verbally acknowledged these results. Electronically Signed   By: Kristine Garbe M.D.   On: 08/31/2016 23:04   Ct Angio Neck W And/or Wo Contrast  Result Date: 09/01/2016 CLINICAL DATA:  Cervical spine fractures after fall. EXAM: CT ANGIOGRAPHY NECK TECHNIQUE: Multidetector CT imaging of the neck was performed using the standard protocol during bolus administration of intravenous contrast. Multiplanar CT image reconstructions and MIPs were obtained to evaluate the vascular anatomy. Carotid stenosis measurements (when applicable) are obtained utilizing NASCET criteria, using the distal internal carotid diameter as the denominator. CONTRAST:  100 mL Isovue 370 IV COMPARISON:  Cervical spine CT 08/31/2016 FINDINGS: Aortic arch: Mild atherosclerotic calcification of the aortic arch. There is a normal 3 vessel branching pattern. The visualized proximal subclavian arteries are normal. No dissection or aneurysm. Central pulmonary arteries are normal. Right carotid system: There is minimal calcified plaque of  the proximal internal carotid artery without hemodynamically significant stenosis. No dissection or other acute injury. Left carotid system: Minimal atherosclerotic plaque at the carotid bifurcation proximal left internal carotid artery without hemodynamically significant stenosis. No dissection or other acute abnormality. Vertebral arteries: Both vertebral artery origins are widely patent. The left vertebral artery is dominant. There is very mild short segment narrowing of the right vertebral artery at the level of the right transverse foramen fracture of C3. This is best demonstrated on series 12, image 66 and series 8 images 203-211. The remainder of the artery is normal. The left vertebral artery is normal. Skeleton: Fractures of the right C3 transverse foramen, C7 right inferior facet and anterior superior C7 corner are again noted. No static subluxation. Other neck: The nasopharynx is clear. The oropharynx and hypopharynx are normal. The epiglottis is normal. The supraglottic larynx, glottis and subglottic larynx are normal. No retropharyngeal collection. The parapharyngeal spaces are preserved. The parotid and submandibular glands are normal. No sialolithiasis or salivary ductal dilatation. The thyroid gland is normal. There is no cervical lymphadenopathy. Upper chest: Clear IMPRESSION: 1. Minimal luminal narrowing of the right vertebral artery at the level of the right C3 transverse foramen fracture may indicate a grade 1 intimal injury. 2. Otherwise normal CTA of the neck. 3. Multiple cervical spine fractures, as previously characterized on concomitant cervical spine CT. Electronically Signed   By: Ulyses Jarred M.D.   On: 09/01/2016 00:31   Ct Chest W Contrast  Result Date: 09/01/2016 CLINICAL DATA:  56 year old male with  fall. Patient complaining of neck and back and leg pain. EXAM: CT CHEST, ABDOMEN, AND PELVIS WITH CONTRAST TECHNIQUE: Multidetector CT imaging of the chest, abdomen and pelvis was  performed following the standard protocol during bolus administration of intravenous contrast. CONTRAST:  100 cc Isovue 370 COMPARISON:  chest and pelvic radiographs dated 08/31/2016 FINDINGS: CT CHEST FINDINGS Cardiovascular: There is no cardiomegaly or pericardial effusion. There is mild atherosclerotic calcification of the thoracic aorta. No aneurysmal dilatation or evidence of dissection. The visualized origins of the great vessels of the aortic arch appear patent. The central pulmonary arteries are patent as well. Mediastinum/Nodes: There is no hilar or mediastinal adenopathy. Mild thickened appearance of the esophagus likely related to underdistention. The thyroid gland is grossly unremarkable. Lungs/Pleura: Minimal bibasilar dependent atelectatic changes of the lungs. There is no focal consolidation, pleural effusion, or pneumothorax. The central airways are patent. Musculoskeletal: There is no axillary adenopathy. The chest wall soft tissues appear unremarkable. No acute fracture. CT ABDOMEN PELVIS FINDINGS No intra-abdominal free air or free fluid. Hepatobiliary: No focal liver abnormality is seen. No gallstones, gallbladder wall thickening, or biliary dilatation. Pancreas: Unremarkable. No pancreatic ductal dilatation or surrounding inflammatory changes. Spleen: Normal in size without focal abnormality. Adrenals/Urinary Tract: A 1.5 cm cyst in the inferior pole of the right kidney appears minimally complex. An area of nodular appearing density within the inferior portion of the cyst (series 7 image 79 and series 4 image 25) is likely artifactual. An associated mural nodule is not entirely excluded. Follow-up with ultrasound recommended to better evaluate the cyst and exclude solid component. The kidneys are otherwise unremarkable. There is symmetric uptake and excretion of contrast by kidneys. The urinary bladder is distended and unremarkable. Stomach/Bowel: There is no evidence of bowel obstruction or  active inflammation. Normal appendix. Vascular/Lymphatic: There is mild aortoiliac atherosclerotic disease. The origins of the celiac axis, SMA, IMA as well as the origins of the renal arteries are patent. The SMV, splenic vein, and the main portal vein is patent. No portal venous gas identified. There is no adenopathy. Reproductive: The prostate and seminal vesicles are grossly unremarkable. Other: None Musculoskeletal:  No acute fracture. IMPRESSION: 1. No acute/traumatic intrathoracic, abdominal, or pelvic pathology. 2. A 1.5 cm right renal cyst. Nonemergent ultrasound is recommended for further characterization of this cyst. Electronically Signed   By: Anner Crete M.D.   On: 09/01/2016 00:26   Ct Cervical Spine Wo Contrast  Result Date: 08/31/2016 CLINICAL DATA:  56 y/o M; found lying on round, altered mental status, intoxication, right arm weakness. EXAM: CT HEAD WITHOUT CONTRAST CT CERVICAL SPINE WITHOUT CONTRAST TECHNIQUE: Multidetector CT imaging of the head and cervical spine was performed following the standard protocol without intravenous contrast. Multiplanar CT image reconstructions of the cervical spine were also generated. COMPARISON:  08/22/2016 CT head and cervical spine. FINDINGS: CT HEAD FINDINGS Brain: No evidence of acute infarction, hemorrhage, hydrocephalus, extra-axial collection or significant mass effect. Stable 7 mm dense focus along the right tentorium, probably a meningioma (series 9, image 44). Vascular: No hyperdense vessel or unexpected calcification. Skull: Normal. Negative for fracture or focal lesion. Sinuses/Orbits: No acute finding. Other: None. CT CERVICAL SPINE FINDINGS Alignment: Normal. Skull base and vertebrae: Nondisplaced acute fracture through the right C3 foramen transversarium (series 7, image 35). Nondisplaced acute fracture of the right inferior articular facet of the C7 vertebral body (series 12, image 25). Acute fracture through the anterior margin of the  superior endplate of the C7 vertebral body (series  13, image 31). Anterior displacement of a bridging osteophyte between the C3 and C4 vertebral bodies and prevertebral edema from C2 -C5 may represent nondisplaced fracture through the C3-4 disc (series image 33). No loss of vertebral body height.  No dislocation. Soft tissues and spinal canal: 7 mm nodule within the right lobe of thyroid. No canal hematoma identified. Disc levels: Stable cervical spondylosis without high-grade bony canal stenosis. Upper chest: Negative. Other: Negative. IMPRESSION: 1. Nondisplaced acute fracture through the right C3 foramen transversarium. CT angiogram of the neck is recommended to evaluate for vascular injury. 2. Nondisplaced acute fracture of the right inferior articular facet of the C7 vertebral body. 3. Minimally displaced acute fracture through the anterior margin of the superior endplate of the C7 vertebral body. 4. Anterior displacement of a bridging osteophyte between the C3 and C4 vertebral bodies and prevertebral edema from C2 -C5 may represent fracture through the C3-4 disc. 5. No dislocation or malalignment of the cervical spine. 6. No acute intracranial abnormality. These results were called by telephone at the time of interpretation on 08/31/2016 at 11:04 pm to Dr. Leo Grosser , who verbally acknowledged these results. Electronically Signed   By: Kristine Garbe M.D.   On: 08/31/2016 23:04   Mr Cervical Spine Wo Contrast  Result Date: 09/01/2016 CLINICAL DATA:  Found down.  Upper extremity weakness. EXAM: MRI CERVICAL SPINE WITHOUT CONTRAST TECHNIQUE: Multiplanar, multisequence MR imaging of the cervical spine was performed. No intravenous contrast was administered. COMPARISON:  CTA CTA of the neck 08/31/2016. CT of cervical spine 08/31/2016. FINDINGS: Alignment: AP alignment is anatomic. There straightening of the normal cervical lordosis. Vertebrae: Anterior for fractures C3 C7 are better appreciated by  CT. C7 posterior element fractures on the right are also better appreciated on CT. Multilevel endplate degenerative changes most evident at C5-6 and C6-7 with fatty replacement. Cord: There is focal central T2 signal abnormality in the cord at the C3 and C3-4 level. Signal changes involve the gray matter without significant expansion of was. There is some peripheral sparing. No other focal cord signal changes are evident. Posterior Fossa, vertebral arteries, paraspinal tissues: Prevertebral edema and probable hemorrhage is evident C1 through C5-6. A fluid level is present on axial imaging. The vertebral artery intact. Craniocervical junction is normal. Prominent adenoid tissue is noted. Disc levels: C2-3: Moderate foraminal narrowing is worse on the left secondary to uncovertebral and facet disease. C3-4: A broad-based disc osteophyte complex partially effaces the ventral CSF but does not compress the cord. Severe right and moderate left foraminal narrowing is due to uncovertebral disease. C4-5: A broad-based disc osteophyte complex is present. Severe right and moderate left foraminal narrowing is present. There is partial effacement ventral CSF without cord compromise. C5-6: A broad-based disc osteophyte complex is present. There is effacement of ventral CSF. Moderate central and severe bilateral foraminal narrowing is evident. C6-7: A broad-based disc osteophyte complex is asymmetric to the left. This effaces the ventral CSF. Moderate central and severe bilateral foraminal stenosis is present. C7-T1: A broad-based disc protrusion is present. Facet hypertrophy contributes moderate foraminal stenosis bilaterally. There is partial effacement of the ventral CSF. IMPRESSION: 1. 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 The cord is not expanded, making tumor less likely. The pattern is atypical for a demyelinating process. 2. Anterior  corner fractures at C3 and C7 with prevertebral edema and hemorrhage. 3. Posterior element fractures at C7 and C3  are less well appreciated on this study. 4. Multilevel spondylosis of the cervical spine as described. 5. Mild central canal stenosis without cord compromise at the C3-4 level. 6. The most significant central canal stenosis is at C5-6 without focal cord signal abnormality at this level. These results were called by telephone at the time of interpretation on 09/01/2016 at 7:00 am to Dr. Ashok Pall , who verbally acknowledged these results. Electronically Signed   By: San Morelle M.D.   On: 09/01/2016 07:09   Ct Abdomen Pelvis W Contrast  Result Date: 09/01/2016 CLINICAL DATA:  56 year old male with fall. Patient complaining of neck and back and leg pain. EXAM: CT CHEST, ABDOMEN, AND PELVIS WITH CONTRAST TECHNIQUE: Multidetector CT imaging of the chest, abdomen and pelvis was performed following the standard protocol during bolus administration of intravenous contrast. CONTRAST:  100 cc Isovue 370 COMPARISON:  chest and pelvic radiographs dated 08/31/2016 FINDINGS: CT CHEST FINDINGS Cardiovascular: There is no cardiomegaly or pericardial effusion. There is mild atherosclerotic calcification of the thoracic aorta. No aneurysmal dilatation or evidence of dissection. The visualized origins of the great vessels of the aortic arch appear patent. The central pulmonary arteries are patent as well. Mediastinum/Nodes: There is no hilar or mediastinal adenopathy. Mild thickened appearance of the esophagus likely related to underdistention. The thyroid gland is grossly unremarkable. Lungs/Pleura: Minimal bibasilar dependent atelectatic changes of the lungs. There is no focal consolidation, pleural effusion, or pneumothorax. The central airways are patent. Musculoskeletal: There is no axillary adenopathy. The chest wall soft tissues appear unremarkable. No acute fracture. CT ABDOMEN PELVIS FINDINGS No  intra-abdominal free air or free fluid. Hepatobiliary: No focal liver abnormality is seen. No gallstones, gallbladder wall thickening, or biliary dilatation. Pancreas: Unremarkable. No pancreatic ductal dilatation or surrounding inflammatory changes. Spleen: Normal in size without focal abnormality. Adrenals/Urinary Tract: A 1.5 cm cyst in the inferior pole of the right kidney appears minimally complex. An area of nodular appearing density within the inferior portion of the cyst (series 7 image 79 and series 4 image 25) is likely artifactual. An associated mural nodule is not entirely excluded. Follow-up with ultrasound recommended to better evaluate the cyst and exclude solid component. The kidneys are otherwise unremarkable. There is symmetric uptake and excretion of contrast by kidneys. The urinary bladder is distended and unremarkable. Stomach/Bowel: There is no evidence of bowel obstruction or active inflammation. Normal appendix. Vascular/Lymphatic: There is mild aortoiliac atherosclerotic disease. The origins of the celiac axis, SMA, IMA as well as the origins of the renal arteries are patent. The SMV, splenic vein, and the main portal vein is patent. No portal venous gas identified. There is no adenopathy. Reproductive: The prostate and seminal vesicles are grossly unremarkable. Other: None Musculoskeletal:  No acute fracture. IMPRESSION: 1. No acute/traumatic intrathoracic, abdominal, or pelvic pathology. 2. A 1.5 cm right renal cyst. Nonemergent ultrasound is recommended for further characterization of this cyst. Electronically Signed   By: Anner Crete M.D.   On: 09/01/2016 00:26    Anti-infectives: Anti-infectives    None      Assessment/Plan: Found down  C3 foramen, C3-4 osteophyte FX, C7 facet FX - collar per Dr. Prescott Gum cord vs spinal infarct - no cervical decompression needed per Dr. Christella Noa. Lyrica. PT/OT FEN - reg diet, D/C foley ETOH abuse - CIWA VTE - PAS,  Lovenox Dispo - PT/OT. CIR eval. To floor.   LOS: 1 day    Georganna Skeans, MD, MPH, FACS Trauma: [phone] General Surgery: [phone]  2/23/2018Patient ID: Patrick Romero, male   DOB: [birth year], 56 y.o.   MRN: [id number]

## 2016-09-02 NOTE — Progress Notes (Signed)
Patient arrived to the unit. Denies pain at this time will continue to monitor.

## 2016-09-02 NOTE — Progress Notes (Signed)
Rehab Admissions Coordinator Note:  Patient was screened by Cleatrice Burke for appropriateness for an Inpatient Acute Rehab Consult per OT recommendation.  At this time, we are recommending Inpatient Rehab consult.  Cleatrice Burke 09/02/2016, 11:31 AM  I can be reached at [phone].

## 2016-09-03 LAB — CBC
HCT: 29.4 % — ABNORMAL LOW (ref 39.0–52.0)
HEMOGLOBIN: 9.7 g/dL — AB (ref 13.0–17.0)
MCH: 26.1 pg (ref 26.0–34.0)
MCHC: 33 g/dL (ref 30.0–36.0)
MCV: 79 fL (ref 78.0–100.0)
PLATELETS: 264 10*3/uL (ref 150–400)
RBC: 3.72 MIL/uL — AB (ref 4.22–5.81)
RDW: 13.2 % (ref 11.5–15.5)
WBC: 7 10*3/uL (ref 4.0–10.5)

## 2016-09-03 LAB — BASIC METABOLIC PANEL
Anion gap: 10 (ref 5–15)
BUN: 13 mg/dL (ref 6–20)
CALCIUM: 9 mg/dL (ref 8.9–10.3)
CO2: 23 mmol/L (ref 22–32)
CREATININE: 0.86 mg/dL (ref 0.61–1.24)
Chloride: 102 mmol/L (ref 101–111)
GLUCOSE: 101 mg/dL — AB (ref 65–99)
Potassium: 4.2 mmol/L (ref 3.5–5.1)
Sodium: 135 mmol/L (ref 135–145)

## 2016-09-03 NOTE — Progress Notes (Signed)
Trauma Service Note  Subjective: Patient the same  Objective: Vital signs in last 24 hours: Temp:  [98.1 F (36.7 C)-99.8 F (37.7 C)] 98.4 F (36.9 C) (02/24 0909) Pulse Rate:  [64-77] 66 (02/24 0909) Resp:  [10-21] 16 (02/24 0909) BP: (126-183)/(82-101) 162/92 (02/24 0909) SpO2:  [96 %-100 %] 97 % (02/24 0909) Last BM Date:  (PTA)  Intake/Output from previous day: 02/23 0701 - 02/24 0700 In: 40 [I.V.:40] Out: 1925 [Urine:1925] Intake/Output this shift: Total I/O In: 120 [P.O.:120] Out: 500 [Urine:500]  General: No acute distress.  Has neck pain  Lungs: clear  Abd: Benign.  Having bowel movements.  Good bowel sounds  Extremities: No changes  Neuro: No movement of RUE, Limited LUE movement not involving the hands, Stron LLE, weak RLE  Lab Results: CBC   Recent Labs  09/01/16 0838 09/03/16 0344  WBC 9.7 7.0  HGB 9.0* 9.7*  HCT 28.1* 29.4*  PLT 268 264   BMET  Recent Labs  09/01/16 0838 09/03/16 0344  NA 137 135  K 4.1 4.2  CL 107 102  CO2 14* 23  GLUCOSE 63* 101*  BUN 20 13  CREATININE 1.00 0.86  CALCIUM 8.2* 9.0   PT/INR  Recent Labs  08/31/16 2251  LABPROT 13.0  INR 0.98   ABG No results for input(s): PHART, HCO3 in the last 72 hours.  Invalid input(s): PCO2, PO2  Studies/Results: No results found.  Anti-infectives: Anti-infectives    None      Assessment/Plan: s/p  Central cord with incomplete quadraparesis.  Rehab next week?  LOS: 2 days   Kathryne Eriksson. Dahlia Bailiff, MD, FACS [phone] Trauma Surgeon 09/03/2016

## 2016-09-03 NOTE — Progress Notes (Signed)
Pt seen and examined.  No issues overnight. Pain is fairly manageable Tolerating po  EXAM: Temp:  [98.1 F (36.7 C)-99.8 F (37.7 C)] 98.5 F (36.9 C) (02/24 0526) Pulse Rate:  [64-81] 64 (02/24 0526) Resp:  [10-21] 20 (02/24 0526) BP: (126-183)/(82-101) 172/97 (02/24 0616) SpO2:  [96 %-100 %] 98 % (02/24 0526) Intake/Output      02/23 0701 - 02/24 0700 02/24 0701 - 02/25 0700   P.O.     I.V. (mL/kg) 40 (0.6)    IV Piggyback     Total Intake(mL/kg) 40 (0.6)    Urine (mL/kg/hr) 1925 (1.3)    Total Output 1925     Net -1885           Alert and oriented Speech clear and fluent PERRL RUE: 0/5 LLE: 4/5 LUE: 3/5 No change in exam  Stable Continue current care Continue to work with Pt/OT

## 2016-09-04 NOTE — Progress Notes (Signed)
Pt seen and examined.  No issues overnight. Denies any changes.  EXAM: Temp:  [98 F (36.7 C)-99.4 F (37.4 C)] 99.3 F (37.4 C) (02/25 0515) Pulse Rate:  [65-86] 76 (02/25 0515) Resp:  [16-20] 18 (02/25 0515) BP: (128-164)/(78-98) 128/78 (02/25 0515) SpO2:  [95 %-100 %] 95 % (02/25 0515) Intake/Output      02/24 0701 - 02/25 0700 02/25 0701 - 02/26 0700   P.O. 360    I.V. (mL/kg)     Total Intake(mL/kg) 360 (5.7)    Urine (mL/kg/hr) 950 (0.6)    Total Output 950     Net -590          Urine Occurrence 1 x     Alert and oriented Speech clear and fluent PERRL RUE: 0/5 Right leg Extension: 3/5 Left leg extension: 5/5 LUE: 3/5 No change in exam  Stable Continue current care Continue to work with PT/OT Rehab pending

## 2016-09-04 NOTE — Progress Notes (Signed)
Trauma Service Note  Subjective: No changes, trying to eat, having trouble using left arm  Objective: Vital signs in last 24 hours: Temp:  [98 F (36.7 C)-99.4 F (37.4 C)] 99.3 F (37.4 C) (02/25 0515) Pulse Rate:  [65-86] 76 (02/25 0515) Resp:  [16-20] 18 (02/25 0515) BP: (128-164)/(78-98) 128/78 (02/25 0515) SpO2:  [95 %-100 %] 95 % (02/25 0515) Last BM Date:  (PTA)  Intake/Output from previous day: 02/24 0701 - 02/25 0700 In: 360 [P.O.:360] Out: 950 [Urine:950] Intake/Output this shift: No intake/output data recorded.  General: NAD  Lungs: CTAB  Abd: NT, ND  Extremities: left grip 3/5, flexion 5/5, extension 3/5. Right arm 0/5 throughout, right leg 3/5 extension, left leg 5/5 extension and flexion  Neuro: AOx4  Lab Results: CBC   Recent Labs  09/01/16 0838 09/03/16 0344  WBC 9.7 7.0  HGB 9.0* 9.7*  HCT 28.1* 29.4*  PLT 268 264   BMET  Recent Labs  09/01/16 0838 09/03/16 0344  NA 137 135  K 4.1 4.2  CL 107 102  CO2 14* 23  GLUCOSE 63* 101*  BUN 20 13  CREATININE 1.00 0.86  CALCIUM 8.2* 9.0   PT/INR No results for input(s): LABPROT, INR in the last 72 hours. ABG No results for input(s): PHART, HCO3 in the last 72 hours.  Invalid input(s): PCO2, PO2  Studies/Results: No results found.  Anti-infectives: Anti-infectives    None      Medications Scheduled Meds: . enoxaparin (LOVENOX) injection  40 mg Subcutaneous Q24H  . folic acid  1 mg Oral Daily  . LORazepam  0-4 mg Oral Q12H  . multivitamin with minerals  1 tablet Oral Daily  . pantoprazole  40 mg Oral Daily  . pregabalin  100 mg Oral TID  . thiamine  100 mg Oral Daily   Continuous Infusions: . sodium chloride 10 mL/hr at 09/03/16 1819   PRN Meds:.acetaminophen, acetaminophen, alum & mag hydroxide-simeth, bisacodyl, diphenhydrAMINE, fentaNYL (SUBLIMAZE) injection, lip balm, magic mouthwash, menthol-cetylpyridinium, methocarbamol (ROBAXIN)  IV, metoprolol, ondansetron **OR**  ondansetron (ZOFRAN) IV, oxyCODONE, phenol, prochlorperazine  Assessment/Plan: s/p  Central cord -continue diet and assistance -continue OT -rehab pending   LOS: 3 days   [place] Trauma Surgeon [phone] Surgery 09/04/2016

## 2016-09-05 ENCOUNTER — Encounter (HOSPITAL_COMMUNITY): Payer: Self-pay

## 2016-09-05 ENCOUNTER — Inpatient Hospital Stay (HOSPITAL_COMMUNITY)
Admission: RE | Admit: 2016-09-05 | Discharge: 2016-09-30 | DRG: 949 | Disposition: A | Discharge status: Home Health | Payer: Medicaid Other | Source: Intra-hospital | Attending: Physical Medicine & Rehabilitation | Admitting: Physical Medicine & Rehabilitation

## 2016-09-05 ENCOUNTER — Encounter (HOSPITAL_COMMUNITY): Payer: Self-pay | Admitting: Physical Medicine and Rehabilitation

## 2016-09-05 DIAGNOSIS — R55 Syncope and collapse: Secondary | ICD-10-CM | POA: Diagnosis present

## 2016-09-05 DIAGNOSIS — S14129D Central cord syndrome at unspecified level of cervical spinal cord, subsequent encounter: Secondary | ICD-10-CM | POA: Diagnosis present

## 2016-09-05 DIAGNOSIS — S0083XD Contusion of other part of head, subsequent encounter: Secondary | ICD-10-CM | POA: Diagnosis not present

## 2016-09-05 DIAGNOSIS — M792 Neuralgia and neuritis, unspecified: Secondary | ICD-10-CM

## 2016-09-05 DIAGNOSIS — M79609 Pain in unspecified limb: Secondary | ICD-10-CM | POA: Diagnosis not present

## 2016-09-05 DIAGNOSIS — M5412 Radiculopathy, cervical region: Secondary | ICD-10-CM | POA: Diagnosis present

## 2016-09-05 DIAGNOSIS — Z79899 Other long term (current) drug therapy: Secondary | ICD-10-CM

## 2016-09-05 DIAGNOSIS — G629 Polyneuropathy, unspecified: Secondary | ICD-10-CM | POA: Diagnosis present

## 2016-09-05 DIAGNOSIS — D62 Acute posthemorrhagic anemia: Secondary | ICD-10-CM | POA: Diagnosis present

## 2016-09-05 DIAGNOSIS — M4712 Other spondylosis with myelopathy, cervical region: Secondary | ICD-10-CM

## 2016-09-05 DIAGNOSIS — N281 Cyst of kidney, acquired: Secondary | ICD-10-CM

## 2016-09-05 DIAGNOSIS — F1010 Alcohol abuse, uncomplicated: Secondary | ICD-10-CM

## 2016-09-05 DIAGNOSIS — R0789 Other chest pain: Secondary | ICD-10-CM | POA: Diagnosis present

## 2016-09-05 DIAGNOSIS — R208 Other disturbances of skin sensation: Secondary | ICD-10-CM | POA: Diagnosis present

## 2016-09-05 DIAGNOSIS — S14129S Central cord syndrome at unspecified level of cervical spinal cord, sequela: Secondary | ICD-10-CM

## 2016-09-05 DIAGNOSIS — G959 Disease of spinal cord, unspecified: Secondary | ICD-10-CM | POA: Diagnosis present

## 2016-09-05 DIAGNOSIS — F17210 Nicotine dependence, cigarettes, uncomplicated: Secondary | ICD-10-CM | POA: Diagnosis present

## 2016-09-05 DIAGNOSIS — M542 Cervicalgia: Secondary | ICD-10-CM

## 2016-09-05 DIAGNOSIS — Z720 Tobacco use: Secondary | ICD-10-CM

## 2016-09-05 DIAGNOSIS — Z87898 Personal history of other specified conditions: Secondary | ICD-10-CM | POA: Diagnosis not present

## 2016-09-05 DIAGNOSIS — G8254 Quadriplegia, C5-C7 incomplete: Secondary | ICD-10-CM | POA: Diagnosis not present

## 2016-09-05 DIAGNOSIS — S14129A Central cord syndrome at unspecified level of cervical spinal cord, initial encounter: Secondary | ICD-10-CM

## 2016-09-05 DIAGNOSIS — G8250 Quadriplegia, unspecified: Secondary | ICD-10-CM | POA: Diagnosis present

## 2016-09-05 DIAGNOSIS — D649 Anemia, unspecified: Secondary | ICD-10-CM | POA: Diagnosis present

## 2016-09-05 DIAGNOSIS — R52 Pain, unspecified: Secondary | ICD-10-CM

## 2016-09-05 DIAGNOSIS — Z86718 Personal history of other venous thrombosis and embolism: Secondary | ICD-10-CM | POA: Diagnosis not present

## 2016-09-05 DIAGNOSIS — S14109A Unspecified injury at unspecified level of cervical spinal cord, initial encounter: Secondary | ICD-10-CM | POA: Diagnosis present

## 2016-09-05 DIAGNOSIS — G8252 Quadriplegia, C1-C4 incomplete: Secondary | ICD-10-CM | POA: Diagnosis not present

## 2016-09-05 MED ORDER — DIPHENHYDRAMINE HCL 50 MG/ML IJ SOLN: 12.5000 mg | Freq: Four times a day (QID) | INTRAMUSCULAR | Status: DC | PRN

## 2016-09-05 MED ORDER — FLEET ENEMA 7-19 GM/118ML RE ENEM: 1.0000 | ENEMA | Freq: Once | RECTAL | Status: DC | PRN

## 2016-09-05 MED ORDER — ADULT MULTIVITAMIN W/MINERALS CH
1.0000 | ORAL_TABLET | Freq: Every day | ORAL | Status: DC
  Administered 2016-09-06 – 2016-09-30 (×25): 1 via ORAL
  Filled 2016-09-05 (×25): qty 1

## 2016-09-05 MED ORDER — GUAIFENESIN-DM 100-10 MG/5ML PO SYRP: 5.0000 mL | ORAL_SOLUTION | Freq: Four times a day (QID) | ORAL | Status: DC | PRN

## 2016-09-05 MED ORDER — PROCHLORPERAZINE EDISYLATE 5 MG/ML IJ SOLN: 5.0000 mg | Freq: Four times a day (QID) | INTRAMUSCULAR | Status: DC | PRN

## 2016-09-05 MED ORDER — TRAZODONE HCL 50 MG PO TABS
25.0000 mg | ORAL_TABLET | Freq: Every evening | ORAL | Status: DC | PRN
  Filled 2016-09-05: qty 1

## 2016-09-05 MED ORDER — PROCHLORPERAZINE MALEATE 5 MG PO TABS: 5.0000 mg | ORAL_TABLET | Freq: Four times a day (QID) | ORAL | Status: DC | PRN

## 2016-09-05 MED ORDER — TRAMADOL HCL 50 MG PO TABS
50.0000 mg | ORAL_TABLET | Freq: Four times a day (QID) | ORAL | Status: DC | PRN
  Administered 2016-09-07 – 2016-09-15 (×3): 50 mg via ORAL
  Filled 2016-09-05 (×3): qty 1

## 2016-09-05 MED ORDER — PROCHLORPERAZINE 25 MG RE SUPP: 12.5000 mg | Freq: Four times a day (QID) | RECTAL | Status: DC | PRN

## 2016-09-05 MED ORDER — POLYETHYLENE GLYCOL 3350 17 G PO PACK
17.0000 g | PACK | Freq: Two times a day (BID) | ORAL | Status: DC
  Administered 2016-09-05 – 2016-09-30 (×48): 17 g via ORAL
  Filled 2016-09-05 (×50): qty 1

## 2016-09-05 MED ORDER — MENTHOL 3 MG MT LOZG
1.0000 | LOZENGE | OROMUCOSAL | Status: DC | PRN
  Filled 2016-09-05: qty 9

## 2016-09-05 MED ORDER — BISACODYL 10 MG RE SUPP: 10.0000 mg | Freq: Every day | RECTAL | Status: DC | PRN

## 2016-09-05 MED ORDER — POLYETHYLENE GLYCOL 3350 17 G PO PACK
17.0000 g | PACK | Freq: Once | ORAL | Status: AC
  Administered 2016-09-14: 17 g via ORAL
  Filled 2016-09-05 (×2): qty 1

## 2016-09-05 MED ORDER — BISACODYL 10 MG RE SUPP
10.0000 mg | Freq: Every day | RECTAL | Status: DC | PRN
  Administered 2016-09-06 – 2016-09-10 (×2): 10 mg via RECTAL
  Filled 2016-09-05 (×2): qty 1

## 2016-09-05 MED ORDER — PREGABALIN 50 MG PO CAPS
100.0000 mg | ORAL_CAPSULE | Freq: Three times a day (TID) | ORAL | Status: DC
  Administered 2016-09-05 – 2016-09-30 (×75): 100 mg via ORAL
  Filled 2016-09-05 (×75): qty 2

## 2016-09-05 MED ORDER — ACETAMINOPHEN 325 MG PO TABS: 325.0000 mg | ORAL_TABLET | ORAL | Status: DC | PRN

## 2016-09-05 MED ORDER — PANTOPRAZOLE SODIUM 40 MG PO TBEC
40.0000 mg | DELAYED_RELEASE_TABLET | Freq: Every day | ORAL | Status: DC
  Administered 2016-09-06 – 2016-09-30 (×25): 40 mg via ORAL
  Filled 2016-09-05 (×25): qty 1

## 2016-09-05 MED ORDER — DIPHENHYDRAMINE HCL 12.5 MG/5ML PO ELIX: 12.5000 mg | ORAL_SOLUTION | Freq: Four times a day (QID) | ORAL | Status: DC | PRN

## 2016-09-05 MED ORDER — ENOXAPARIN SODIUM 40 MG/0.4ML ~~LOC~~ SOLN
40.0000 mg | SUBCUTANEOUS | Status: DC
  Administered 2016-09-06 – 2016-09-29 (×24): 40 mg via SUBCUTANEOUS
  Filled 2016-09-05 (×23): qty 0.4

## 2016-09-05 MED ORDER — CYCLOBENZAPRINE HCL 5 MG PO TABS
5.0000 mg | ORAL_TABLET | Freq: Three times a day (TID) | ORAL | Status: DC | PRN
  Administered 2016-09-06 – 2016-09-30 (×17): 5 mg via ORAL
  Filled 2016-09-05 (×21): qty 1

## 2016-09-05 MED ORDER — VITAMIN B-1 100 MG PO TABS
100.0000 mg | ORAL_TABLET | Freq: Every day | ORAL | Status: DC
  Administered 2016-09-06 – 2016-09-30 (×25): 100 mg via ORAL
  Filled 2016-09-05 (×25): qty 1

## 2016-09-05 MED ORDER — FOLIC ACID 1 MG PO TABS
1.0000 mg | ORAL_TABLET | Freq: Every day | ORAL | Status: DC
  Administered 2016-09-06 – 2016-09-14 (×9): 1 mg via ORAL
  Filled 2016-09-05 (×9): qty 1

## 2016-09-05 MED ORDER — ALUM & MAG HYDROXIDE-SIMETH 200-200-20 MG/5ML PO SUSP: 30.0000 mL | Freq: Four times a day (QID) | ORAL | Status: DC | PRN

## 2016-09-05 MED ORDER — ALUM & MAG HYDROXIDE-SIMETH 200-200-20 MG/5ML PO SUSP: 30.0000 mL | ORAL | Status: DC | PRN

## 2016-09-05 MED ORDER — OXYCODONE HCL 5 MG PO TABS
5.0000 mg | ORAL_TABLET | ORAL | Status: DC | PRN
  Administered 2016-09-06: 10 mg via ORAL
  Filled 2016-09-05: qty 2
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
## 2016-09-05 NOTE — Progress Notes (Signed)
Pt arrived on floor at approx 1630 via bed.  Pt A&O x4, able to make needs known, aspen brace on at this time, skin intact, lungs clear, ABD distended, bladder scan completed and scan for 640, I/O cath for 400.  Pt resting comfortably with SR up x 2, call bell within reach, family at bedside.
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
## 2016-09-05 NOTE — Progress Notes (Signed)
Occupational Therapy Treatment Patient Details Name: Patrick Romero MRN: [id number] DOB: 1960-08-14 Today's Date: 09/05/2016    History of present illness Pt is 56 y.o. 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 PMH includes chronic neck and LBP.    OT comments  Pt progressing well toward OT goals. Able to complete oral care tasks sitting at EOB with mod assist for trunk support to maintain balance. Pt able to brush teeth utilizing universal cuff with mod assistance. He was able to complete stand-pivot toilet transfer with max assist +2 this session. Pt would continue to benefit from OT services while admitted to improve independence with ADL and functional mobility. Continue to feel that pt is an excellent candidate for CIR placement in order to maximize independence with ADL.   Follow Up Recommendations  CIR;Supervision/Assistance - 24 hour    Equipment Recommendations  Other (comment) (TBD)    Recommendations for Other Services Rehab consult    Precautions / Restrictions Precautions Precautions: Cervical Precaution Comments: Reviewed precautions with pt during functional activities.  Required Braces or Orthoses: Cervical Brace Cervical Brace: Hard collar Restrictions Weight Bearing Restrictions: No Other Position/Activity Restrictions: Pt with very weak core       Mobility Bed Mobility Overal bed mobility: Needs Assistance Bed Mobility: Rolling;Sidelying to Sit Rolling: Max assist Sidelying to sit: HOB elevated;+2 for physical assistance;Max assist       General bed mobility comments: Practiced log roll technique. Max A +2  Transfers Overall transfer level: Needs assistance Equipment used: 2 person hand held assist Transfers: Sit to/from [facility] Sit to Stand: +2 physical assistance;Mod assist Stand pivot transfers: +2 physical assistance;Max  assist       General transfer comment: Required assistance from OT to move R LE in standing position. OT blocking R knee during rise to standing.     Balance Overall balance assessment: Needs assistance Sitting-balance support: Bilateral upper extremity supported;Feet supported Sitting balance-Leahy Scale: Poor Sitting balance - Comments: Able to briefly sit without back support with supervision and single UE support. Required mod assist during functional tasks and max assist during LE exercises with PT.   Standing balance support: During functional activity;Single extremity supported Standing balance-Leahy Scale: Poor Standing balance comment: Mod assist +2 to maintain standing.                   ADL Overall ADL's : Needs assistance/impaired     Grooming: Oral care;Maximal assistance;Sitting Grooming Details (indicate cue type and reason): Able to use universal cuff to brush teeth with L UE with assistance to support L shoulder positioning. Mod assist to maintain sitting balance during task.                 Toilet Transfer: Maximal assistance;+2 for physical assistance;Stand-pivot             General ADL Comments: Pt demonstrating progress with functional use of L UE with universal cuff.       Vision                     Perception     Praxis      Cognition   Behavior During Therapy: [facility] for tasks assessed/performed Overall Cognitive Status: No family/caregiver present to determine baseline cognitive functioning                  General Comments: Unable to  recall details of accident.       Exercises General Exercises - Upper Extremity Wrist Flexion: Right;PROM;10 reps Wrist Extension: PROM;Right;10 reps   Shoulder Instructions       General Comments      Pertinent Vitals/ Pain       Pain Assessment: Faces Faces Pain Scale: Hurts little more Pain Location: Bilat shoulders, L hand, BUE Pain Descriptors / Indicators:  Tingling;Numbness;Sore Pain Intervention(s): Limited activity within patient's tolerance;Monitored during session;Repositioned  Home Living                                      Lives With: Significant other (Lives with girlfriend who does not work.)    Prior Functioning/Environment              Frequency  Min 3X/week        Progress Toward Goals  OT Goals(current goals can now be found in the care plan section)  Progress towards OT goals: Progressing toward goals  Acute Rehab OT Goals Patient Stated Goal: Return home OT Goal Formulation: With patient Time For Goal Achievement: 09/16/16 Potential to Achieve Goals: Good ADL Goals Pt Will Perform Eating: sitting;with adaptive utensils;with mod assist (with L hand propped on several pillows.) Pt Will Perform Grooming: with max assist;with adaptive equipment;sitting (with L arm propped on pillows.) Pt Will Perform Upper Body Dressing: with max assist;sitting Pt Will Transfer to Toilet: with mod assist;squat pivot transfer;bedside commode (transferring to the L) Additional ADL Goal #1: Pt will be educated and understand the need to preserve tenodesis grasp Bly. Additional ADL Goal #2: Pt/family will be independent with BUE HEP for ROM maintaining tenodesis grasp when ranging wrist and hands.  Plan Discharge plan remains appropriate    Co-evaluation    PT/OT/SLP Co-Evaluation/Treatment: Yes Reason for Co-Treatment: Complexity of the patient's impairments (multi-system involvement);For patient/therapist safety;To address functional/ADL transfers PT goals addressed during session: Mobility/safety with mobility;Balance;Strengthening/ROM OT goals addressed during session: ADL's and self-care;Proper use of Adaptive equipment and DME      End of Session Equipment Utilized During Treatment: Gait belt (cervical collar)  OT Visit Diagnosis: Other abnormalities of gait and mobility (R26.89)   Activity Tolerance  Patient tolerated treatment well   Patient Left in chair;with call bell/phone within reach   Nurse Communication Mobility status (Potential need for lift equipment)        Time: 1030-1105 OT Time Calculation (min): 35 min  Charges: OT General Charges $OT Visit: 1 Procedure OT Treatments $Self Care/Home Management : 8-22 mins  Norman Herrlich, MS OTR/L  Pager: [place] [facility] Patrick Romero 09/05/2016, 12:05 PM

## 2016-09-05 NOTE — Progress Notes (Signed)
Orthopedic Tech Progress Note Patient Details:  Patrick Romero [birth year] [id number] Called in advanced brace order; spoke with [facility] Patient ID: Patrick Romero, male   DOB: 01-05-1961, 56 y.o.   MRN: [id number]   Patrick Romero 09/05/2016, 4:47 PM

## 2016-09-05 NOTE — Progress Notes (Signed)
Pt transported to rehab taking all personal belongings. Girlfriend at bedside.  Report given to nurse prior to arrival.

## 2016-09-05 NOTE — Progress Notes (Signed)
Report given to nurse on rehab. Pt to be transferred to 4W10.

## 2016-09-05 NOTE — Progress Notes (Deleted)
CNA reported that pt attempted to remove contacts with betadine, alcohol, and tweezers.  Items removed from room as well as all other items, leaving pt clothing only. Pt calm and co-operative and sitting on edge of bed at this time.

## 2016-09-05 NOTE — H&P (Signed)
Physical Medicine and Rehabilitation Admission H&P    Chief Complaint  Patient presents with  . Incomplete quadriplegia.     HPI:  Patrick Romero is a 56 y.o. male who was found unresponsive behind [facility] on 08/31/16 with RUE weakness and lack of movement BLE and LUE as well as poor rectal tone. History taken from chart review and patient. ETOH level 324.  MRI cervical spine reviewed, showing central cord abnormality C2-3 and C3-4. 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 He was evaluated by Dr. Christella Noa who recommended cervical collar for support and Lyrica added to help manage dysesthesias. He has had improvement in motor strength but patient with deficits in mobility and self care tasks due to quadriplegia. CIR recommended for follow up therapy.    Review of Systems  Constitutional: Negative for diaphoresis and fever.  HENT: Negative for hearing loss and tinnitus.   Eyes: Positive for blurred vision (chronic). Negative for double vision.  Respiratory: Negative for cough, sputum production and shortness of breath.   Cardiovascular: Positive for chest pain (mid chest with deep breaths). Negative for leg swelling.  Gastrointestinal: Positive for constipation (no BM since admission). Negative for abdominal pain, heartburn, nausea and vomiting.  Genitourinary: Negative for dysuria and urgency.  Musculoskeletal: Positive for back pain, joint pain (chronic hip and knee pain), myalgias and neck pain.  Skin: Negative for rash.  Neurological: Positive for tingling, sensory change (decreased from waist down. ), focal weakness, weakness and headaches. Negative for dizziness.  Psychiatric/Behavioral: The patient has insomnia (chronic).   All other systems reviewed and are negative.     Past Medical History:  Diagnosis Date  . Herniated disc, cervical      History reviewed. No pertinent surgical history.    Family History  Problem Relation Age of Onset  . Hypertension Mother   . Lung cancer Father   . COPD Sister     Social History:  Lives with girlfriend. Girlfriend disabled due to back problems. 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 He  reports that he has been smoking Cigarettes--1/2PPD.   He has never used smokeless tobacco. He reports that he drinks alcohol--6 packs occasionally and a pint/week. He reports that he uses drugs, including Marijuana.    Allergies: No Known Allergies    Medications Prior to Admission  Medication Sig Dispense Refill  . acetaminophen (TYLENOL) 500 MG tablet Take 1,000 mg by mouth every 6 (six) hours as needed for mild pain.    . Aspirin-Salicylamide-Caffeine (BC HEADACHE POWDER PO) Take 2 packets by mouth every 4 (four) hours as needed (for pain).     Marland Kitchen ibuprofen (ADVIL,MOTRIN) 200 MG tablet Take 400 mg by mouth every 6 (six) hours as needed for headache or moderate pain.     . Multiple Vitamins-Minerals (MULTIVITAMINS THER. W/MINERALS) TABS Take 1 tablet by mouth daily.    . cyclobenzaprine (FLEXERIL) 5 MG tablet Take 1 tablet (5 mg total) by mouth at bedtime as needed for muscle spasms. (Patient not taking: Reported on 09/01/2016) 20 tablet 0  . methocarbamol (ROBAXIN) 500 MG tablet Take 1 tablet (500 mg total) by mouth 2 (two) times daily as needed for muscle spasms. (Patient not taking: Reported on 09/01/2016) 20 tablet 0  . PredniSONE 10 MG KIT 12 day dose pack po (Patient not taking: Reported on 09/01/2016) 1  kit 0  . traMADol (ULTRAM) 50 MG tablet Take 1 tablet (50 mg total) by mouth every 6 (six) hours as needed. (Patient not taking: Reported on 09/01/2016) 15 tablet 0    Home: Home Living Family/patient expects to be discharged to:: Private residence Living Arrangements: Spouse/significant other Available Help at Discharge: Available PRN/intermittently Type  of Home: Apartment Home Access: Stairs to enter [employer] of Steps: 1 Home Layout: One level Bathroom Shower/Tub: Chiropodist: Standard Home Equipment: Cane - single point, Industrial/product designer History: Prior Function Level of Independence: Needs assistance Gait / Transfers Assistance Needed: Pt able to amb short distances with SPC before requiring rest breaks secondary to back pain. ADL's / Homemaking Assistance Needed: Indep with bathing and feeding.  Comments: Pt drives  Functional Status:  Mobility: Bed Mobility Overal bed mobility: Needs Assistance Bed Mobility: Rolling, Sidelying to Sit Rolling: Max assist Sidelying to sit: HOB elevated, +2 for physical assistance, Max assist General bed mobility comments: Practiced log roll technique. Max A +2 Transfers Overall transfer level: Needs assistance Equipment used: 2 person hand held assist Transfers: Sit to/from Stand, Stand Pivot Transfers Sit to Stand: +2 physical assistance, Max assist Stand pivot transfers: +2 physical assistance, Max assist General transfer comment: MaxA +2 for trunk support, bilat knee instability (R>L), and hip extension. Pt with decreased gait initiation, able to scoot a couple steps sideways to chair with maxA.       ADL: ADL Overall ADL's : Needs assistance/impaired Eating/Feeding: Total assistance, Bed level Grooming: Total assistance, Bed level Upper Body Bathing: Total assistance, Sitting Lower Body Bathing: Total assistance, +2 for physical assistance, Bed level Upper Body Dressing : Total assistance, Bed level Lower Body Dressing: Total assistance, Bed level Toilet Transfer: Maximal assistance, +2 for physical assistance, BSC Toileting- Clothing Manipulation and Hygiene: Total assistance, +2 for physical assistance, Sit to/from stand Functional mobility during ADLs: Maximal assistance, Rolling walker General ADL Comments: Pt very limited with adls.   Presents more like Central Cord Syndrome.  Pt with very weak trunk muscles and very weak RUE.  Pt able to stand on L LE but requires assist to block R knee tor remain standing.   Cognition: Cognition Overall Cognitive Status: No family/caregiver present to determine baseline cognitive functioning (Likely baseline) Orientation Level: Oriented X4 Cognition Arousal/Alertness: Awake/alert Behavior During Therapy: WFL for tasks assessed/performed Overall Cognitive Status: No family/caregiver present to determine baseline cognitive functioning (Likely baseline) Area of Impairment: Awareness, Attention Current Attention Level: Sustained Awareness: Emergent General Comments: Unable to recall details of accident.    Blood pressure 104/70, pulse 72, temperature 99.1 F (37.3 C), temperature source Oral, resp. rate 18, height 6' (1.829 m), weight 63.2 kg (139 lb 5.3 oz), SpO2 96 %. Physical Exam  Nursing note and vitals reviewed. Constitutional: He is oriented to person, place, and time. He appears well-developed and well-nourished.  HENT:  Head: Normocephalic and atraumatic.  Mouth/Throat: Oropharynx is clear and moist.  Eyes: EOM are normal. Pupils are equal, round, and reactive to light.  Neck: Normal range of motion. Neck supple.  Cardiovascular: Normal rate and regular rhythm.   Respiratory: Effort normal and breath sounds normal. No stridor. No respiratory distress. He has no wheezes. He exhibits no tenderness.  GI: Soft. Bowel sounds are normal. He exhibits no distension. There is no tenderness.  Musculoskeletal: He exhibits no edema or tenderness.  Neurological: He is alert and oriented to person, place, and time.  DTRs 3+ b/l LE Sensation  diminished to light touch b/l LE Motor: RUE: Shoulder abduction, elbow flex/ext 2-/5, distally 0/5 LUE: Shoulder abduction, elbow flex/ext 3/5, distally 0/5 RLE: HF, KE, 4-/5, ADF/PF 2/5 LLE: HF, KE 4-/5, ADF/PF 3+/5  Skin: Skin is warm and dry.    Psychiatric: He has a normal mood and affect. His behavior is normal. Thought content normal.    No results found for this or any previous visit (from the past 48 hour(s)). No results found.     Medical Problem List and Plan: 1.  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 2.  DVT Prophylaxis/Anticoagulation: Pharmaceutical: Lovenox 3. Pain Management: Continue oxycodone prn for now. Will add ultram for moderate pain. Dysesthesias managed on current dose lyrica.  4. Mood: LCSW to follow for evaluation and support.  5. Neuropsych: This patient is capable of making decisions on his own behalf. 6. Skin/Wound Care:  Air mattress overlay. Pressure relief measures.  7. Fluids/Electrolytes/Nutrition: Monitor I/O. Check lytes in am. Intake reported to be good. 8. History of presyncope/syncope: Monitor for now. 9. Anemia: question of chronic disease due to Alcohol use.  Will check anemia panel.  10. Mid chest wall pain:  likely musculoskeletal in nature. Monitor for now.  11. Right renal cyst: Will need non-emergent follow up ultrasound for work up. 12. ETOH abuse/Tobacco: Counsel    Post Admission Physician Evaluation: 1. Preadmission assessment reviewed and changes made below. 2. Functional deficits secondary  to central cord syndrome. 3. Patient is admitted to receive collaborative, interdisciplinary care between the physiatrist, rehab nursing staff, and therapy team. 4. Patient's level of medical complexity and substantial therapy needs in context of that medical necessity cannot be provided at a lesser intensity of care such as a SNF. 5. Patient has experienced substantial functional loss from his/her baseline which was documented above under the "Functional History" and "Functional Status" headings.  Judging by the patient's diagnosis, physical exam, and functional history, the patient has potential for functional progress which will result in measurable gains while on  inpatient rehab.  These gains will be of substantial and practical use upon discharge  in facilitating mobility and self-care at the household level. 6. Physiatrist will provide 24 hour management of medical needs as well as oversight of the therapy plan/treatment and provide guidance as appropriate regarding the interaction of the two. 7. The Preadmission Screening has been reviewed and patient status is unchanged unless otherwise stated above. 8. 24 hour rehab nursing will assist with bladder management, bowel management, safety, skin/wound care, disease management, pain management and patient education  and help integrate therapy concepts, techniques,education, etc. 9. PT will assess and treat for/with: Lower extremity strength, range of motion, stamina, balance, functional mobility, safety, adaptive techniques and equipment, woundcare, coping skills, pain control, education.   Goals are: Min A. 10. OT will assess and treat for/with: ADL's, functional mobility, safety, upper extremity strength, adaptive techniques and equipment, wound mgt, ego support, and community reintegration.   Goals are: Min/Mod A. Therapy may proceed with showering this patient. 11. Case Management and Social Worker will assess and treat for psychological issues and discharge planning. 12. Team conference will be held weekly to assess progress toward goals and to determine barriers to discharge. 13. Patient will receive at least 3 hours of therapy per day at least 5 days per week. 14. ELOS: 20-24 days.       15. Prognosis:  good  Delice Lesch, MD, [address] [place] 09/05/2016

## 2016-09-05 NOTE — Progress Notes (Signed)
Rehab admissions - I met with patient and he would like to come to inpatient rehab.  He has no insurance.  He has applied for disability and has a hearing pending in March.  He is leaning towards discharge home with his mother after rehab stay.  He has a girlfriend and can discharge home with girlfriend as well.  Bed available and will admit to acute inpatient rehab today.  Call me for questions.  #[phone]

## 2016-09-06 ENCOUNTER — Inpatient Hospital Stay (HOSPITAL_COMMUNITY): Payer: Medicaid Other | Admitting: Physical Therapy

## 2016-09-06 ENCOUNTER — Inpatient Hospital Stay (HOSPITAL_COMMUNITY): Payer: Medicaid Other

## 2016-09-06 ENCOUNTER — Inpatient Hospital Stay (HOSPITAL_COMMUNITY): Payer: Medicaid Other | Admitting: Occupational Therapy

## 2016-09-06 ENCOUNTER — Inpatient Hospital Stay (HOSPITAL_COMMUNITY): Payer: Self-pay | Admitting: Occupational Therapy

## 2016-09-06 ENCOUNTER — Inpatient Hospital Stay (HOSPITAL_COMMUNITY): Payer: Self-pay | Admitting: Physical Therapy

## 2016-09-06 DIAGNOSIS — M79609 Pain in unspecified limb: Secondary | ICD-10-CM

## 2016-09-06 DIAGNOSIS — G8252 Quadriplegia, C1-C4 incomplete: Secondary | ICD-10-CM

## 2016-09-06 DIAGNOSIS — G8254 Quadriplegia, C5-C7 incomplete: Secondary | ICD-10-CM

## 2016-09-06 LAB — COMPREHENSIVE METABOLIC PANEL
ALT: 49 U/L (ref 17–63)
ANION GAP: 9 (ref 5–15)
AST: 54 U/L — ABNORMAL HIGH (ref 15–41)
Albumin: 3.2 g/dL — ABNORMAL LOW (ref 3.5–5.0)
Alkaline Phosphatase: 68 U/L (ref 38–126)
BILIRUBIN TOTAL: 0.7 mg/dL (ref 0.3–1.2)
BUN: 12 mg/dL (ref 6–20)
CHLORIDE: 99 mmol/L — AB (ref 101–111)
CO2: 28 mmol/L (ref 22–32)
Calcium: 9.6 mg/dL (ref 8.9–10.3)
Creatinine, Ser: 1.02 mg/dL (ref 0.61–1.24)
GFR calc Af Amer: >60 mL/min (ref >60)
GFR calc non Af Amer: >60 mL/min (ref >60)
Glucose, Bld: 94 mg/dL (ref 65–99)
POTASSIUM: 4.6 mmol/L (ref 3.5–5.1)
Sodium: 136 mmol/L (ref 135–145)
TOTAL PROTEIN: 7 g/dL (ref 6.5–8.1)

## 2016-09-06 LAB — CBC WITH DIFFERENTIAL/PLATELET
Basophils Absolute: 0 K/uL (ref 0.0–0.1)
Basophils Relative: 0 %
Eosinophils Absolute: 0.3 K/uL (ref 0.0–0.7)
Eosinophils Relative: 3 %
HCT: 33.7 % — ABNORMAL LOW (ref 39.0–52.0)
Hemoglobin: 10.9 g/dL — ABNORMAL LOW (ref 13.0–17.0)
Lymphocytes Relative: 29 %
Lymphs Abs: 2.2 K/uL (ref 0.7–4.0)
MCH: 26 pg (ref 26.0–34.0)
MCHC: 32.3 g/dL (ref 30.0–36.0)
MCV: 80.4 fL (ref 78.0–100.0)
Monocytes Absolute: 0.9 K/uL (ref 0.1–1.0)
Monocytes Relative: 12 %
Neutro Abs: 4.4 K/uL (ref 1.7–7.7)
Neutrophils Relative %: 56 %
Platelets: 288 K/uL (ref 150–400)
RBC: 4.19 MIL/uL — ABNORMAL LOW (ref 4.22–5.81)
RDW: 13.5 % (ref 11.5–15.5)
WBC: 7.8 K/uL (ref 4.0–10.5)

## 2016-09-06 MED ORDER — PRO-STAT SUGAR FREE PO LIQD
30.0000 mL | Freq: Two times a day (BID) | ORAL | Status: DC
  Administered 2016-09-06 – 2016-09-30 (×49): 30 mL via ORAL
  Filled 2016-09-06 (×49): qty 30

## 2016-09-06 MED ORDER — OXYCODONE HCL 5 MG PO TABS
5.0000 mg | ORAL_TABLET | ORAL | Status: DC | PRN
  Administered 2016-09-06 – 2016-09-30 (×67): 5 mg via ORAL
  Filled 2016-09-06 (×68): qty 1

## 2016-09-06 NOTE — Evaluation (Signed)
Occupational Therapy Assessment and Plan  Patient Details  Name: Patrick Romero MRN: [id number] Date of Birth: Jul 04, 1961  OT Diagnosis: acute pain and quadriplegia at level C2 - 4 Rehab Potential: Rehab Potential (ACUTE ONLY): Good ELOS: 28-30 days   Today's Date: 09/06/2016 OT Individual Time: 1045-1200 OT Individual Time Calculation (min): 75 min     Problem List:  Patient Active Problem List   Diagnosis Date Noted  . Cervical myelopathy with cervical radiculopathy 09/05/2016  . Tetraparesis ([place])   . Central cord syndrome ([place])   . Anemia   . Renal cyst   . ETOH abuse   . Chest wall pain   . Neuropathic pain   . Neck pain   . Chronic neck and back pain 09/01/2016  . Alcohol intoxication ([place]) 09/01/2016  . Tobacco abuse 09/01/2016  . Paraplegia following spinal cord injury ([place]) 09/01/2016    Past Medical History:  Past Medical History:  Diagnosis Date  . Chronic back pain   . Chronic neck pain   . Herniated disc, cervical    Past Surgical History: History reviewed. No pertinent surgical history.  Assessment & Plan Clinical Impression: Patrick Romero is a 56 y.o. male who was found unresponsive behind [facility] on 08/31/16 with RUE weakness and lack of movement BLE and LUE as well as poor rectal tone. History taken from chart review and patient. ETOH level 324.  MRI cervical spine reviewed, showing central cord abnormality C2-3 and C3-4. 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 He was evaluated by Dr. Christella Noa who recommended cervical collar for support and Lyrica added to help manage dysesthesias. He has had improvement in motor strength but patient with deficits in mobility and self care tasks due to quadriplegia. CIR recommended for follow up therapy. Patient transferred to CIR on 09/05/2016 .    Patient currently requires total with basic  self-care skills secondary to muscle paralysis, decreased cardiorespiratoy endurance, abnormal tone and unbalanced muscle activation and decreased sitting balance, decreased standing balance and decreased postural control.  Prior to hospitalization, patient could complete ADLs and mobility with modified independent .  Patient will benefit from skilled intervention to increase independence with basic self-care skills prior to discharge home with care partner.  Anticipate patient will require minimal physical assistance and follow up home health.  OT - End of Session Activity Tolerance: Tolerates 10 - 20 min activity with multiple rests OT Assessment Rehab Potential (ACUTE ONLY): Good OT Patient demonstrates impairments in the following area(s): Balance;Motor;Endurance;Pain;Sensory OT Basic ADL's Functional Problem(s): Eating;Grooming;Bathing;Dressing;Toileting OT Transfers Functional Problem(s): Toilet;Tub/Shower OT Additional Impairment(s): Fuctional Use of Upper Extremity OT Plan OT Intensity: Minimum of 1-2 x/day, 45 to 90 minutes OT Frequency: 5 out of 7 days OT Duration/Estimated Length of Stay: 28-30 days OT Treatment/Interventions: Balance/vestibular training;Discharge planning;DME/adaptive equipment instruction;Functional mobility training;Neuromuscular re-education;Functional electrical stimulation;Pain management;Patient/family education;Self Care/advanced ADL retraining;Psychosocial support;Therapeutic Activities;Therapeutic Exercise;UE/LE Strength taining/ROM;UE/LE Coordination activities OT Self Feeding Anticipated Outcome(s): set up OT Basic Self-Care Anticipated Outcome(s): min A OT Toileting Anticipated Outcome(s): min A OT Bathroom Transfers Anticipated Outcome(s): supervision OT Recommendation Patient destination: Home Follow Up Recommendations: Home health OT Equipment Recommended: Tub/shower bench;3 in 1 bedside comode   Skilled Therapeutic Intervention Pt seen for  initial evaluation, ADL training, education on purpose of OT, OT POC.  Pt very motivated and agreeable to any methods to help him improve.  Due to his central cord  syndrome, he has severe weakness/ paralysis in UE in which BUE are non functional and therefore he needs total A with eating, grooming, bathing, dressing, toileting. Pt has been using bed pan so used first portion of session to practice toilet transfers 2x.  Placed padded open seat bench over toilet and pt was able to complete a stand pivot with 2 person A and mod/max A.  He has impaired trunk control which makes showering difficult at this time.  Discussed having pt complete night baths with nursing to allow for more time in OT sessions to focus on BUE NMR. Pt in agreement as the limited movement in his arms is his greatest obstacle at this point.  +2 mod A for pt to stand a sink for 1 minute. Pt taken to hi/low table positioned at his chest height to engage in A/Arom for B arms.  In this position, he was able to elicit more movement of his scapula/shoulders and the L elbow.  Trace movement felt in R elbow.  E-stim for 5 minutes to L wrist/finger extensors at 35 pps, 10 sec on and 5 off. Pt had a good response at level of intensity 26.  He was thrilled to see his hand opening. Education with pt on purpose of estim and how multiple treatments often needed. He stated he would like to work with this modality often.  Pt taken back to room with quick release belt on, soft call belt in lap,  and all needs met.  OT Evaluation Precautions/Restrictions  Precautions Precautions: Cervical Required Braces or Orthoses: Cervical Brace Cervical Brace: Hard collar Restrictions Other Position/Activity Restrictions: Pt with very weak core   Pain Pain Assessment Pain Assessment: 0-10 Pain Score: 7  Pain Type: Acute pain Pain Location: Neck Pain Orientation: Right;Left;Anterior;Posterior Pain Descriptors / Indicators: Aching Pain Intervention(s):  Distraction Home Living/Prior Functioning Home Living Available Help at Discharge: Family Type of Home: House Home Layout: One level Bathroom Shower/Tub: Public librarian, Walk-in shower, Curtain  Lives With: Significant other (plans to move to mother's house with sister) Prior Function Level of Independence: Independent with basic ADLs, Independent with gait, Requires assistive device for independence  Able to Take Stairs?: Yes Driving: No Vocation Requirements: worked at landfill Comments: Pt does not drive, uses bus ADL ADL ADL Comments: refer to functional navigator Vision/Perception  Vision- History Baseline Vision/History: Wears glasses Wears Glasses: Reading only Patient Visual Report: No change from baseline Vision- Assessment Vision Assessment?: No apparent visual deficits Perception Comments: WFL  Cognition Overall Cognitive Status: Within Functional Limits for tasks assessed Arousal/Alertness: Awake/alert Orientation Level: Person;Place;Situation Person: Oriented Place: Oriented Situation: Oriented Year: 2018 Month: February Day of Week: Correct Memory: Appears intact Immediate Memory Recall: Sock;Blue;Bed Memory Recall: Sock;Blue;Bed Memory Recall Sock: Without Cue Memory Recall Blue: Without Cue Memory Recall Bed: Without Cue Sensation Sensation Light Touch: Impaired by gross assessment (pt can feel light touch on BUE, but slightly impaired for specific light touch) Stereognosis: Impaired by gross assessment Hot/Cold: Appears Intact Proprioception: Impaired by gross assessment Coordination Gross Motor Movements are Fluid and Coordinated: No Fine Motor Movements are Fluid and Coordinated: No Coordination and Movement Description: active movement in scapula, partial in L shoulder/elbow in gravity eliminated positions Finger Nose Finger Test: unable to perform Motor  Motor Motor: Tetraplegia Motor - Skilled Clinical Observations: 1/5 in B shoulders,  1/5 L elbow flexor, 0/5 B hands Mobility    refer to functional navigator Trunk/Postural Assessment  Cervical Assessment Cervical Assessment:  (wears hard C collar) Postural  Control Postural Control: Deficits on evaluation Trunk Control: severely impaired due to weak core  Balance Static Sitting Balance Static Sitting - Level of Assistance: 4: Min assist;Other (comment) (can sit with S after he sits for a minute with support) Dynamic Sitting Balance Dynamic Sitting - Level of Assistance: 1: +1 Total assist Static Standing Balance Static Standing - Level of Assistance: 1: +2 Total assist Dynamic Standing Balance Dynamic Standing - Level of Assistance: 1: +2 Total assist Extremity/Trunk Assessment RUE Assessment RUE Assessment: Exceptions to [facility] RUE AROM (degrees) RUE Overall AROM Comments: partial scapular elevation and shoulder protraction with arm in gravity eliminated but no active elbow to fingers. LUE Assessment LUE Assessment: Exceptions to WFL LUE AROM (degrees) LUE Overall AROM Comments: partial scapular elevation, shoulder protraction, elbow flexion with arm in gravity eliminated position, no active wrist or fingers   See Function Navigator for Current Functional Status.   Refer to Care Plan for Long Term Goals  Recommendations for other services: None    Discharge Criteria: Patient will be discharged from OT if patient refuses treatment 3 consecutive times without medical reason, if treatment goals not met, if there is a change in medical status, if patient makes no progress towards goals or if patient is discharged from hospital.  The above assessment, treatment plan, treatment alternatives and goals were discussed and mutually agreed upon: by patient  [facility] 09/06/2016, 12:42 PM

## 2016-09-06 NOTE — Progress Notes (Signed)
Meredith Staggers, MD Physician Signed Physical Medicine and Rehabilitation  Consult Note Date of Service: 09/02/2016 11:46 AM  Related encounter: ED to Hosp-Admission (Discharged) from 08/31/2016 in [facility]   '[]' Hide copied text '[]' Hover for attribution information      Physical Medicine and Rehabilitation Consult   Reason for Consult: Incomplete quadriplegia Referring Physician: Dr. Christella Noa.    HPI: Patrick Romero is a 56 y.o. male who was found unresponsive behind [facility] with LUE weakness and lack of movement BLE and RUE as well as poor rectal tone. ETOH level 324.  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 He was evaluated by Dr. Christella Noa who recommended cervical collar for support and Lyrica added to help manage dysesthesias. PT/OT evaluations done today revealing deficits in mobility and self care tasks due to quadriplegia. CIR recommended for follow up therapy.    Review of Systems  Constitutional: Positive for malaise/fatigue.  HENT: Negative for hearing loss and tinnitus.   Eyes: Positive for blurred vision. Negative for double vision.  Respiratory: Positive for shortness of breath. Negative for cough.   Cardiovascular: Negative for chest pain and palpitations.  Gastrointestinal: Negative for heartburn, nausea and vomiting.  Genitourinary: Negative for frequency, hematuria and urgency.  Musculoskeletal: Positive for back pain (chronic), joint pain (chronic hip and knee pain) and neck pain.  Skin: Negative for itching and rash.  Neurological: Positive for dizziness, tremors, sensory change, focal weakness, weakness and headaches.  Psychiatric/Behavioral: Positive for depression. The patient is nervous/anxious and has  insomnia.           Past Medical History:  Diagnosis Date  . Herniated disc, cervical     History reviewed. No pertinent surgical history.         Family History  Problem Relation Age of Onset  . Hypertension Mother   . Lung cancer Father   . COPD Sister       Social History:  Lives with girlfriend. Girlfriend disabled due to back problems. Works day labor at landfill. He  reports that he has been smoking Cigarettes--1/2PPD.   He has never used smokeless tobacco. He reports that he drinks alcohol--6 packs occasionally and a pint/week. He reports that he uses drugs, including Marijuana.    Allergies: No Known Allergies          Medications Prior to Admission  Medication Sig Dispense Refill  . acetaminophen (TYLENOL) 500 MG tablet Take 1,000 mg by mouth every 6 (six) hours as needed for mild pain.    . Aspirin-Salicylamide-Caffeine (BC HEADACHE POWDER PO) Take 2 packets by mouth every 4 (four) hours as needed (for pain).     Marland Kitchen ibuprofen (ADVIL,MOTRIN) 200 MG tablet Take 400 mg by mouth every 6 (six) hours as needed for headache or moderate pain.     . Multiple Vitamins-Minerals (MULTIVITAMINS THER. W/MINERALS) TABS Take 1 tablet by mouth daily.    . cyclobenzaprine (FLEXERIL) 5 MG tablet Take 1 tablet (5 mg total) by mouth at bedtime as needed for muscle spasms. (Patient not taking: Reported on 09/01/2016) 20 tablet 0  . methocarbamol (ROBAXIN) 500 MG tablet Take 1 tablet (500 mg total) by mouth 2 (two) times daily as needed for muscle spasms. (Patient not taking: Reported on 09/01/2016) 20 tablet 0  .  PredniSONE 10 MG KIT 12 day dose pack po (Patient not taking: Reported on 09/01/2016) 1 kit 0  . traMADol (ULTRAM) 50 MG tablet Take 1 tablet (50 mg total) by mouth every 6 (six) hours as needed. (Patient not taking: Reported on 09/01/2016) 15 tablet 0    Home: Home Living Family/patient expects to be discharged to:: Private residence Living Arrangements:  Spouse/significant other Available Help at Discharge: Available PRN/intermittently Type of Home: Apartment Home Access: Stairs to enter [employer] of Steps: 1 Home Layout: One level Bathroom Shower/Tub: Chiropodist: Standard Home Equipment: Cane - single point, Careers adviser History: Prior Function Level of Independence: Needs assistance Gait / Transfers Assistance Needed: Pt able to amb short distances with SPC before requiring rest breaks secondary to back pain. ADL's / Homemaking Assistance Needed: Indep with bathing and feeding.  Comments: Pt drives Functional Status:  Mobility: Bed Mobility Overal bed mobility: Needs Assistance Bed Mobility: Rolling, Sidelying to Sit Rolling: +2 for physical assistance, Max assist Sidelying to sit: HOB elevated, +2 for physical assistance, Max assist General bed mobility comments: MaxA +2 for trunk lift/support and moving LEs  Transfers Overall transfer level: Needs assistance Equipment used: 2 person hand held assist Transfers: Sit to/from Stand, Stand Pivot Transfers Sit to Stand: +2 physical assistance, Max assist Stand pivot transfers: +2 physical assistance, Max assist General transfer comment: MaxA +2 for trunk support, bilat knee instability (R>L), and hip extension. Pt with decreased gait initiation, able to scoot a couple steps sideways to chair with maxA.   ADL: ADL Overall ADL's : Needs assistance/impaired Eating/Feeding: Total assistance, Bed level Grooming: Total assistance, Bed level Upper Body Bathing: Total assistance, Sitting Lower Body Bathing: Total assistance, +2 for physical assistance, Bed level Upper Body Dressing : Total assistance, Bed level Lower Body Dressing: Total assistance, Bed level Toilet Transfer: Maximal assistance, +2 for physical assistance, BSC Toileting- Clothing Manipulation and Hygiene: Total assistance, +2 for physical assistance, Sit to/from  stand Functional mobility during ADLs: Maximal assistance, Rolling walker General ADL Comments: Pt very limited with adls.  Presents more like Central Cord Syndrome.  Pt with very weak trunk muscles and very weak RUE.  Pt able to stand on L LE but requires assist to block R knee tor remain standing.   Cognition: Cognition Overall Cognitive Status: No family/caregiver present to determine baseline cognitive functioning Orientation Level: Oriented X4 Cognition Arousal/Alertness: Awake/alert Behavior During Therapy: WFL for tasks assessed/performed Overall Cognitive Status: No family/caregiver present to determine baseline cognitive functioning Area of Impairment: Awareness, Attention Current Attention Level: Sustained Awareness: Emergent General Comments: Unable to recall details of accident.   Blood pressure (!) 143/88, pulse 65, temperature 99.2 F (37.3 C), temperature source Oral, resp. rate 10, height 6' (1.829 m), weight 63.2 kg (139 lb 5.3 oz), SpO2 96 %. Physical Exam  Nursing note and vitals reviewed. Constitutional: He is oriented to person, place, and time. He appears well-developed and well-nourished.  HENT:  Head: Normocephalic and atraumatic.  Eyes: Conjunctivae are normal. Pupils are equal, round, and reactive to light.  Neck:  Immobilized by cervical collar  Cardiovascular: Normal rate and regular rhythm.   Respiratory: Effort normal and breath sounds normal. No respiratory distress. He has no wheezes.  GI: Soft. Bowel sounds are normal. He exhibits no distension. There is no tenderness.  Musculoskeletal: He exhibits no edema or tenderness.  Neurological: He is alert and oriented to person, place, and time.  Speech clear. Able to answer orientation questions  without difficulty.  Decreased LT RUE>LUE and RLE>LLE with. RUE--tr to 0/5 prox to distal. LUE: 2/5 deltoid, biceps, wrist. Triceps 1-, HI 1.Marland Kitchen LLE: extensor tone and 3/5 HF, KE and 3- ADF/PF. RLE 1/5 HE,KE 0/5  ADF/PF. UE DTR';s 1+ and LE DTR's 3+  Skin: Skin is warm and dry.  Psychiatric: He has a normal mood and affect. His behavior is normal. Thought content normal.    Lab Results Last 24 Hours  No results found for this or any previous visit (from the past 24 hour(s)).    Imaging Results (Last 48 hours)  Dg Chest 1 View  Result Date: 08/31/2016 CLINICAL DATA:  56 year old male with fall. EXAM: CHEST 1 VIEW COMPARISON:  Chest radiograph dated 08/22/2016 FINDINGS: The lungs are clear. There is no pleural effusion or pneumothorax. The cardiac silhouette is within normal limits. Mild tortuosity of the thoracic aorta. No acute osseous pathology. IMPRESSION: No active disease. Electronically Signed   By: Anner Crete M.D.   On: 08/31/2016 22:52   Dg Pelvis 1-2 Views  Result Date: 08/31/2016 CLINICAL DATA:  56 year old male with fall. EXAM: PELVIS - 1-2 VIEW COMPARISON:  Lumbar spine radiograph dated 03/20/2014 FINDINGS: There is no acute fracture or dislocation. Mild osteopenia. No arthritic changes. The soft tissues appear unremarkable. IMPRESSION: No acute/traumatic osseous pathology. Electronically Signed   By: Anner Crete M.D.   On: 08/31/2016 22:53   Ct Head Wo Contrast  Result Date: 08/31/2016 CLINICAL DATA:  56 y/o M; found lying on round, altered mental status, intoxication, right arm weakness. EXAM: CT HEAD WITHOUT CONTRAST CT CERVICAL SPINE WITHOUT CONTRAST TECHNIQUE: Multidetector CT imaging of the head and cervical spine was performed following the standard protocol without intravenous contrast. Multiplanar CT image reconstructions of the cervical spine were also generated. COMPARISON:  08/22/2016 CT head and cervical spine. FINDINGS: CT HEAD FINDINGS Brain: No evidence of acute infarction, hemorrhage, hydrocephalus, extra-axial collection or significant mass effect. Stable 7 mm dense focus along the right tentorium, probably a meningioma (series 9, image 44). Vascular: No  hyperdense vessel or unexpected calcification. Skull: Normal. Negative for fracture or focal lesion. Sinuses/Orbits: No acute finding. Other: None. CT CERVICAL SPINE FINDINGS Alignment: Normal. Skull base and vertebrae: Nondisplaced acute fracture through the right C3 foramen transversarium (series 7, image 35). Nondisplaced acute fracture of the right inferior articular facet of the C7 vertebral body (series 12, image 25). Acute fracture through the anterior margin of the superior endplate of the C7 vertebral body (series 13, image 31). Anterior displacement of a bridging osteophyte between the C3 and C4 vertebral bodies and prevertebral edema from C2 -C5 may represent nondisplaced fracture through the C3-4 disc (series image 33). No loss of vertebral body height.  No dislocation. Soft tissues and spinal canal: 7 mm nodule within the right lobe of thyroid. No canal hematoma identified. Disc levels: Stable cervical spondylosis without high-grade bony canal stenosis. Upper chest: Negative. Other: Negative. IMPRESSION: 1. Nondisplaced acute fracture through the right C3 foramen transversarium. CT angiogram of the neck is recommended to evaluate for vascular injury. 2. Nondisplaced acute fracture of the right inferior articular facet of the C7 vertebral body. 3. Minimally displaced acute fracture through the anterior margin of the superior endplate of the C7 vertebral body. 4. Anterior displacement of a bridging osteophyte between the C3 and C4 vertebral bodies and prevertebral edema from C2 -C5 may represent fracture through the C3-4 disc. 5. No dislocation or malalignment of the cervical spine. 6. No acute intracranial abnormality.  These results were called by telephone at the time of interpretation on 08/31/2016 at 11:04 pm to Dr. Leo Grosser , who verbally acknowledged these results. Electronically Signed   By: Kristine Garbe M.D.   On: 08/31/2016 23:04   Ct Angio Neck W And/or Wo Contrast  Result  Date: 09/01/2016 CLINICAL DATA:  Cervical spine fractures after fall. EXAM: CT ANGIOGRAPHY NECK TECHNIQUE: Multidetector CT imaging of the neck was performed using the standard protocol during bolus administration of intravenous contrast. Multiplanar CT image reconstructions and MIPs were obtained to evaluate the vascular anatomy. Carotid stenosis measurements (when applicable) are obtained utilizing NASCET criteria, using the distal internal carotid diameter as the denominator. CONTRAST:  100 mL Isovue 370 IV COMPARISON:  Cervical spine CT 08/31/2016 FINDINGS: Aortic arch: Mild atherosclerotic calcification of the aortic arch. There is a normal 3 vessel branching pattern. The visualized proximal subclavian arteries are normal. No dissection or aneurysm. Central pulmonary arteries are normal. Right carotid system: There is minimal calcified plaque of the proximal internal carotid artery without hemodynamically significant stenosis. No dissection or other acute injury. Left carotid system: Minimal atherosclerotic plaque at the carotid bifurcation proximal left internal carotid artery without hemodynamically significant stenosis. No dissection or other acute abnormality. Vertebral arteries: Both vertebral artery origins are widely patent. The left vertebral artery is dominant. There is very mild short segment narrowing of the right vertebral artery at the level of the right transverse foramen fracture of C3. This is best demonstrated on series 12, image 66 and series 8 images 203-211. The remainder of the artery is normal. The left vertebral artery is normal. Skeleton: Fractures of the right C3 transverse foramen, C7 right inferior facet and anterior superior C7 corner are again noted. No static subluxation. Other neck: The nasopharynx is clear. The oropharynx and hypopharynx are normal. The epiglottis is normal. The supraglottic larynx, glottis and subglottic larynx are normal. No retropharyngeal collection. The  parapharyngeal spaces are preserved. The parotid and submandibular glands are normal. No sialolithiasis or salivary ductal dilatation. The thyroid gland is normal. There is no cervical lymphadenopathy. Upper chest: Clear IMPRESSION: 1. Minimal luminal narrowing of the right vertebral artery at the level of the right C3 transverse foramen fracture may indicate a grade 1 intimal injury. 2. Otherwise normal CTA of the neck. 3. Multiple cervical spine fractures, as previously characterized on concomitant cervical spine CT. Electronically Signed   By: Ulyses Jarred M.D.   On: 09/01/2016 00:31   Ct Chest W Contrast  Result Date: 09/01/2016 CLINICAL DATA:  56 year old male with fall. Patient complaining of neck and back and leg pain. EXAM: CT CHEST, ABDOMEN, AND PELVIS WITH CONTRAST TECHNIQUE: Multidetector CT imaging of the chest, abdomen and pelvis was performed following the standard protocol during bolus administration of intravenous contrast. CONTRAST:  100 cc Isovue 370 COMPARISON:  chest and pelvic radiographs dated 08/31/2016 FINDINGS: CT CHEST FINDINGS Cardiovascular: There is no cardiomegaly or pericardial effusion. There is mild atherosclerotic calcification of the thoracic aorta. No aneurysmal dilatation or evidence of dissection. The visualized origins of the great vessels of the aortic arch appear patent. The central pulmonary arteries are patent as well. Mediastinum/Nodes: There is no hilar or mediastinal adenopathy. Mild thickened appearance of the esophagus likely related to underdistention. The thyroid gland is grossly unremarkable. Lungs/Pleura: Minimal bibasilar dependent atelectatic changes of the lungs. There is no focal consolidation, pleural effusion, or pneumothorax. The central airways are patent. Musculoskeletal: There is no axillary adenopathy. The chest wall soft tissues appear  unremarkable. No acute fracture. CT ABDOMEN PELVIS FINDINGS No intra-abdominal free air or free fluid.  Hepatobiliary: No focal liver abnormality is seen. No gallstones, gallbladder wall thickening, or biliary dilatation. Pancreas: Unremarkable. No pancreatic ductal dilatation or surrounding inflammatory changes. Spleen: Normal in size without focal abnormality. Adrenals/Urinary Tract: A 1.5 cm cyst in the inferior pole of the right kidney appears minimally complex. An area of nodular appearing density within the inferior portion of the cyst (series 7 image 79 and series 4 image 25) is likely artifactual. An associated mural nodule is not entirely excluded. Follow-up with ultrasound recommended to better evaluate the cyst and exclude solid component. The kidneys are otherwise unremarkable. There is symmetric uptake and excretion of contrast by kidneys. The urinary bladder is distended and unremarkable. Stomach/Bowel: There is no evidence of bowel obstruction or active inflammation. Normal appendix. Vascular/Lymphatic: There is mild aortoiliac atherosclerotic disease. The origins of the celiac axis, SMA, IMA as well as the origins of the renal arteries are patent. The SMV, splenic vein, and the main portal vein is patent. No portal venous gas identified. There is no adenopathy. Reproductive: The prostate and seminal vesicles are grossly unremarkable. Other: None Musculoskeletal:  No acute fracture. IMPRESSION: 1. No acute/traumatic intrathoracic, abdominal, or pelvic pathology. 2. A 1.5 cm right renal cyst. Nonemergent ultrasound is recommended for further characterization of this cyst. Electronically Signed   By: Anner Crete M.D.   On: 09/01/2016 00:26   Ct Cervical Spine Wo Contrast  Result Date: 08/31/2016 CLINICAL DATA:  56 y/o M; found lying on round, altered mental status, intoxication, right arm weakness. EXAM: CT HEAD WITHOUT CONTRAST CT CERVICAL SPINE WITHOUT CONTRAST TECHNIQUE: Multidetector CT imaging of the head and cervical spine was performed following the standard protocol without intravenous  contrast. Multiplanar CT image reconstructions of the cervical spine were also generated. COMPARISON:  08/22/2016 CT head and cervical spine. FINDINGS: CT HEAD FINDINGS Brain: No evidence of acute infarction, hemorrhage, hydrocephalus, extra-axial collection or significant mass effect. Stable 7 mm dense focus along the right tentorium, probably a meningioma (series 9, image 44). Vascular: No hyperdense vessel or unexpected calcification. Skull: Normal. Negative for fracture or focal lesion. Sinuses/Orbits: No acute finding. Other: None. CT CERVICAL SPINE FINDINGS Alignment: Normal. Skull base and vertebrae: Nondisplaced acute fracture through the right C3 foramen transversarium (series 7, image 35). Nondisplaced acute fracture of the right inferior articular facet of the C7 vertebral body (series 12, image 25). Acute fracture through the anterior margin of the superior endplate of the C7 vertebral body (series 13, image 31). Anterior displacement of a bridging osteophyte between the C3 and C4 vertebral bodies and prevertebral edema from C2 -C5 may represent nondisplaced fracture through the C3-4 disc (series image 33). No loss of vertebral body height.  No dislocation. Soft tissues and spinal canal: 7 mm nodule within the right lobe of thyroid. No canal hematoma identified. Disc levels: Stable cervical spondylosis without high-grade bony canal stenosis. Upper chest: Negative. Other: Negative. IMPRESSION: 1. Nondisplaced acute fracture through the right C3 foramen transversarium. CT angiogram of the neck is recommended to evaluate for vascular injury. 2. Nondisplaced acute fracture of the right inferior articular facet of the C7 vertebral body. 3. Minimally displaced acute fracture through the anterior margin of the superior endplate of the C7 vertebral body. 4. Anterior displacement of a bridging osteophyte between the C3 and C4 vertebral bodies and prevertebral edema from C2 -C5 may represent fracture through the  C3-4 disc. 5. No dislocation or  malalignment of the cervical spine. 6. No acute intracranial abnormality. These results were called by telephone at the time of interpretation on 08/31/2016 at 11:04 pm to Dr. Leo Grosser , who verbally acknowledged these results. Electronically Signed   By: Kristine Garbe M.D.   On: 08/31/2016 23:04   Mr Cervical Spine Wo Contrast  Result Date: 09/01/2016 CLINICAL DATA:  Found down.  Upper extremity weakness. EXAM: MRI CERVICAL SPINE WITHOUT CONTRAST TECHNIQUE: Multiplanar, multisequence MR imaging of the cervical spine was performed. No intravenous contrast was administered. COMPARISON:  CTA CTA of the neck 08/31/2016. CT of cervical spine 08/31/2016. FINDINGS: Alignment: AP alignment is anatomic. There straightening of the normal cervical lordosis. Vertebrae: Anterior for fractures C3 C7 are better appreciated by CT. C7 posterior element fractures on the right are also better appreciated on CT. Multilevel endplate degenerative changes most evident at C5-6 and C6-7 with fatty replacement. Cord: There is focal central T2 signal abnormality in the cord at the C3 and C3-4 level. Signal changes involve the gray matter without significant expansion of was. There is some peripheral sparing. No other focal cord signal changes are evident. Posterior Fossa, vertebral arteries, paraspinal tissues: Prevertebral edema and probable hemorrhage is evident C1 through C5-6. A fluid level is present on axial imaging. The vertebral artery intact. Craniocervical junction is normal. Prominent adenoid tissue is noted. Disc levels: C2-3: Moderate foraminal narrowing is worse on the left secondary to uncovertebral and facet disease. C3-4: A broad-based disc osteophyte complex partially effaces the ventral CSF but does not compress the cord. Severe right and moderate left foraminal narrowing is due to uncovertebral disease. C4-5: A broad-based disc osteophyte complex is present. Severe right  and moderate left foraminal narrowing is present. There is partial effacement ventral CSF without cord compromise. C5-6: A broad-based disc osteophyte complex is present. There is effacement of ventral CSF. Moderate central and severe bilateral foraminal narrowing is evident. C6-7: A broad-based disc osteophyte complex is asymmetric to the left. This effaces the ventral CSF. Moderate central and severe bilateral foraminal stenosis is present. C7-T1: A broad-based disc protrusion is present. Facet hypertrophy contributes moderate foraminal stenosis bilaterally. There is partial effacement of the ventral CSF. IMPRESSION: 1. 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 The cord is not expanded, making tumor less likely. The pattern is atypical for a demyelinating process. 2. Anterior corner fractures at C3 and C7 with prevertebral edema and hemorrhage. 3. Posterior element fractures at C7 and C3 are less well appreciated on this study. 4. Multilevel spondylosis of the cervical spine as described. 5. Mild central canal stenosis without cord compromise at the C3-4 level. 6. The most significant central canal stenosis is at C5-6 without focal cord signal abnormality at this level. These results were called by telephone at the time of interpretation on 09/01/2016 at 7:00 am to Dr. Ashok Pall , who verbally acknowledged these results. Electronically Signed   By: San Morelle M.D.   On: 09/01/2016 07:09   Ct Abdomen Pelvis W Contrast  Result Date: 09/01/2016 CLINICAL DATA:  56 year old male with fall. Patient complaining of neck and back and leg pain. EXAM: CT CHEST, ABDOMEN, AND PELVIS WITH CONTRAST TECHNIQUE: Multidetector CT imaging of the chest, abdomen and pelvis was performed following the standard protocol during bolus administration of intravenous contrast. CONTRAST:  100 cc Isovue 370 COMPARISON:  chest and pelvic  radiographs dated 08/31/2016 FINDINGS: CT CHEST FINDINGS Cardiovascular:  There is no cardiomegaly or pericardial effusion. There is mild atherosclerotic calcification of the thoracic aorta. No aneurysmal dilatation or evidence of dissection. The visualized origins of the great vessels of the aortic arch appear patent. The central pulmonary arteries are patent as well. Mediastinum/Nodes: There is no hilar or mediastinal adenopathy. Mild thickened appearance of the esophagus likely related to underdistention. The thyroid gland is grossly unremarkable. Lungs/Pleura: Minimal bibasilar dependent atelectatic changes of the lungs. There is no focal consolidation, pleural effusion, or pneumothorax. The central airways are patent. Musculoskeletal: There is no axillary adenopathy. The chest wall soft tissues appear unremarkable. No acute fracture. CT ABDOMEN PELVIS FINDINGS No intra-abdominal free air or free fluid. Hepatobiliary: No focal liver abnormality is seen. No gallstones, gallbladder wall thickening, or biliary dilatation. Pancreas: Unremarkable. No pancreatic ductal dilatation or surrounding inflammatory changes. Spleen: Normal in size without focal abnormality. Adrenals/Urinary Tract: A 1.5 cm cyst in the inferior pole of the right kidney appears minimally complex. An area of nodular appearing density within the inferior portion of the cyst (series 7 image 79 and series 4 image 25) is likely artifactual. An associated mural nodule is not entirely excluded. Follow-up with ultrasound recommended to better evaluate the cyst and exclude solid component. The kidneys are otherwise unremarkable. There is symmetric uptake and excretion of contrast by kidneys. The urinary bladder is distended and unremarkable. Stomach/Bowel: There is no evidence of bowel obstruction or active inflammation. Normal appendix. Vascular/Lymphatic: There is mild aortoiliac atherosclerotic disease. The origins of the celiac axis, SMA, IMA as well  as the origins of the renal arteries are patent. The SMV, splenic vein, and the main portal vein is patent. No portal venous gas identified. There is no adenopathy. Reproductive: The prostate and seminal vesicles are grossly unremarkable. Other: None Musculoskeletal:  No acute fracture. IMPRESSION: 1. No acute/traumatic intrathoracic, abdominal, or pelvic pathology. 2. A 1.5 cm right renal cyst. Nonemergent ultrasound is recommended for further characterization of this cyst. Electronically Signed   By: Anner Crete M.D.   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 Does the need for close, 24 hr/day medical supervision in concert with the patient's rehab needs make it unreasonable for this patient to be served in a less intensive setting? Yes 2. Co-Morbidities requiring supervision/potential complications: etoh abuse, neurogenic bladder and bowel. Pain and spasticity mgt 3. Due to bladder management, bowel management, safety, skin/wound care, disease management, medication administration, pain management and patient education, does the patient require 24 hr/day rehab nursing? Yes 4. Does the patient require coordinated care of a physician, rehab nurse, PT (1-2 hrs/day, 5 days/week) and OT (1-2 hrs/day, 5 days/week), potentially SLP 1-2 hrs/day 5 days/week.  to address physical and functional deficits in the context of the above medical diagnosis(es)? Yes Addressing deficits in the following areas: balance, endurance, locomotion, strength, transferring, bowel/bladder control, bathing, dressing, feeding, grooming, toileting, cognition and psychosocial support 5. Can the patient actively participate in an intensive therapy program of at least 3 hrs of therapy per day at least 5 days per week? Yes 6. The potential for patient to make measurable gains while on inpatient rehab is  excellent 7. Anticipated functional outcomes upon discharge from inpatient rehab are supervision, min assist and mod assist  with PT, supervision, min assist and mod assist with OT, modified independent with SLP. 8. Estimated rehab length of stay to reach the above functional  goals is: 20-25 days 9. Does the patient have adequate social supports and living environment to accommodate these discharge functional goals? Yes and Potentially 10. Anticipated D/C setting: Home 11. Anticipated post D/C treatments: HH therapy and Outpatient therapy 12. Overall Rehab/Functional Prognosis: excellent  RECOMMENDATIONS: This patient's condition is appropriate for continued rehabilitative care in the following setting: CIR Patient has agreed to participate in recommended program. Yes Note that insurance prior authorization may be required for reimbursement for recommended care.  Comment: Rehab Admissions Coordinator to follow up.  Thanks,  Meredith Staggers, MD, [place], [place] 09/02/2016    Revision History

## 2016-09-06 NOTE — Progress Notes (Signed)
Patrick Diones, RN Rehab Admission Coordinator Signed Physical Medicine and Rehabilitation  PMR Pre-admission Date of Service: 09/05/2016 12:04 PM  Related encounter: ED to Hosp-Admission (Discharged) from 08/31/2016 in [facility]       [] Hide copied text PMR Admission Coordinator Pre-Admission Assessment  Patient: Patrick Romero is an 56 y.o., male MRN: [id number] DOB: 03/19/1961 Height: 6' (182.9 cm) Weight: 63.2 kg (139 lb 5.3 oz)                                                                                                                                                  Insurance Information Self pay - no insurance.  Disability hearing is pending around March 10,2018 per patient.  States he has applied previously due to back/pain issues.  Medicaid Application Date:        Case Manager:   Disability Application Date:        Case Worker:    Emergency Contact Information        Contact Information    Name Relation Home Work Mobile   Patrick Romero Mother [phone]     Patrick Romero Significant other   [phone]     Current Medical History  Patient Admitting Diagnosis: ETOH related injury resulting in C3,C7 spine fractures with incomplete C4 cord injury, LOC with ?TBI   History of Present Illness: A 56 y.o.malewho was found unresponsive behind [facility] with LUE weakness and lack of movement BLE and RUE as well as poor rectal tone. ETOH level 324. 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 He was evaluated by Dr. Christella Noa who recommended cervical collar for support and Lyrica added to help manage dysesthesias. He has had improvement in motor strength but patient with deficits in mobility and self care tasks due to quadriplegia. CIR recommended for  follow up therapy.   Past Medical History      Past Medical History:  Diagnosis Date  . Herniated disc, cervical     Family History  family history includes COPD in his sister; Hypertension in his mother; Lung cancer in his father.  Prior Rehab/Hospitalizations: No previous rehab.  Has the patient had major surgery during 100 days prior to admission? No  Current Medications   Current Facility-Administered Medications:  .  0.9 %  sodium chloride infusion, , Intravenous, Continuous, Georganna Skeans, MD, Last Rate: 10 mL/hr at 09/04/16 2149 .  acetaminophen (TYLENOL) suppository 650 mg, 650 mg, Rectal, Q6H PRN, Michael Boston, MD .  acetaminophen (TYLENOL) tablet 325-650 mg, 325-650 mg, Oral, Q6H PRN, Michael Boston, MD, 650 mg at 09/05/16 0110 .  alum & mag hydroxide-simeth (MAALOX/MYLANTA) 200-200-20 MG/5ML suspension 30 mL, 30 mL, Oral, Q6H PRN, Remo Lipps  Gross, MD .  bisacodyl (DULCOLAX) suppository 10 mg, 10 mg, Rectal, Daily PRN, Michael Boston, MD .  diphenhydrAMINE (BENADRYL) injection 12.5-25 mg, 12.5-25 mg, Intravenous, Q6H PRN, Michael Boston, MD .  enoxaparin (LOVENOX) injection 40 mg, 40 mg, Subcutaneous, Q24H, Elizabeth S Simaan, PA-C, 40 mg at 09/04/16 1512 .  fentaNYL (SUBLIMAZE) injection 25-50 mcg, 25-50 mcg, Intravenous, Q1H PRN, Michael Boston, MD, 50 mcg at 09/02/16 2056 .  folic acid (FOLVITE) tablet 1 mg, 1 mg, Oral, Daily, Michael Boston, MD, 1 mg at 09/05/16 0946 .  lip balm (BLISTEX) ointment, , Topical, PRN, Georganna Skeans, MD .  magic mouthwash, 15 mL, Oral, QID PRN, Michael Boston, MD .  menthol-cetylpyridinium (CEPACOL) lozenge 3 mg, 1 lozenge, Oral, PRN, Michael Boston, MD .  methocarbamol (ROBAXIN) 1,000 mg in dextrose 5 % 50 mL IVPB, 1,000 mg, Intravenous, Q8H PRN, Georganna Skeans, MD, 1,000 mg at 09/02/16 1502 .  metoprolol (LOPRESSOR) injection 5 mg, 5 mg, Intravenous, Q6H PRN, Michael Boston, MD, 5 mg at 09/03/16 0534 .  multivitamin with minerals tablet 1 tablet, 1  tablet, Oral, Daily, Georganna Skeans, MD, 1 tablet at 09/05/16 0944 .  ondansetron (ZOFRAN) tablet 4 mg, 4 mg, Oral, Q6H PRN **OR** ondansetron (ZOFRAN) injection 4 mg, 4 mg, Intravenous, Q6H PRN, Michael Boston, MD .  oxyCODONE (Oxy IR/ROXICODONE) immediate release tablet 5-10 mg, 5-10 mg, Oral, Q4H PRN, Michael Boston, MD, 10 mg at 09/05/16 [id number] .  pantoprazole (PROTONIX) EC tablet 40 mg, 40 mg, Oral, Daily, 40 mg at 09/05/16 0944 **OR** [DISCONTINUED] pantoprazole (PROTONIX) injection 40 mg, 40 mg, Intravenous, Daily, Michael Boston, MD, 40 mg at 09/01/16 0933 .  phenol (CHLORASEPTIC) mouth spray 2 spray, 2 spray, Mouth/Throat, PRN, Michael Boston, MD .  pregabalin (LYRICA) capsule 100 mg, 100 mg, Oral, TID, Georganna Skeans, MD, 100 mg at 09/05/16 0948 .  prochlorperazine (COMPAZINE) injection 5-10 mg, 5-10 mg, Intravenous, Q4H PRN, Michael Boston, MD .  thiamine (VITAMIN B-1) tablet 100 mg, 100 mg, Oral, Daily, 100 mg at 09/05/16 0944 **OR** [DISCONTINUED] thiamine (B-1) injection 100 mg, 100 mg, Intravenous, Daily, Georganna Skeans, MD  Patients Current Diet: Diet regular Room service appropriate? Yes; Fluid consistency: Thin  Precautions / Restrictions Precautions Precautions: Cervical Precaution Comments: Reviewed precautions with pt during functional activities.  Cervical Brace: Hard collar Restrictions Weight Bearing Restrictions: No Other Position/Activity Restrictions: Pt with very weak core   Has the patient had 2 or more falls or a fall with injury in the past year?Yes.  Has fallen about 7 times due to blackouts from ETOH consumption.  Prior Activity Level Community (5-7x/wk): Went out daily.  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  Not driving, takes the bus.  Home Assistive Devices / Equipment Home Assistive Devices/Equipment: None Home Equipment: Cane - single point, Shower seat  Prior Device Use: Indicate devices/aids used by the patient prior to  current illness, exacerbation or injury? Cane  Prior Functional Level Prior Function Level of Independence: Needs assistance Gait / Transfers Assistance Needed: Pt able to amb short distances with SPC before requiring rest breaks secondary to back pain. ADL's / Homemaking Assistance Needed: Indep with bathing and feeding.  Comments: Pt drives  Self Care: Did the patient need help bathing, dressing, using the toilet or eating?  Independent  Indoor Mobility: Did the patient need assistance with walking from room to room (with or without device)? Independent  Stairs: Did the patient need assistance with internal or external  stairs (with or without device)? Independent  Functional Cognition: Did the patient need help planning regular tasks such as shopping or remembering to take medications? Independent  Current Functional Level Cognition  Overall Cognitive Status: No family/caregiver present to determine baseline cognitive functioning Current Attention Level: Sustained Orientation Level: Oriented X4 General Comments: Unable to recall details of accident.     Extremity Assessment (includes Sensation/Coordination)  Upper Extremity Assessment: RUE deficits/detail, LUE deficits/detail RUE Deficits / Details: Shoulder 1/5, biceps 1/5, triceps 1/5, hand 1/5.   RUE Sensation: decreased light touch, decreased proprioception RUE Coordination: decreased fine motor, decreased gross motor LUE Deficits / Details: Shoulder 2/5, biceps 2+/5, triceps 2+/5, hand flex 2/5, finger extension 2/5.   LUE Sensation: decreased light touch, decreased proprioception LUE Coordination: decreased fine motor, decreased gross motor  Lower Extremity Assessment: Defer to PT evaluation RLE Deficits / Details: R hip flex 2+, knee flex/ext 2+, ankle PF/DF 1; impaired discrimination of light touch  RLE Sensation: decreased light touch, decreased proprioception LLE Deficits / Details: L hip flex 3, knee flex/ext  3, ankle PF/DF 2 LLE Sensation: decreased light touch, decreased proprioception    ADLs  Overall ADL's : Needs assistance/impaired Eating/Feeding: Total assistance, Bed level Grooming: Oral care, Sitting, Moderate assistance Grooming Details (indicate cue type and reason): Able to use universal cuff to brush teeth with L UE with assistance to support L shoulder positioning. Mod assist to maintain sitting balance during task. Upper Body Bathing: Total assistance, Sitting Lower Body Bathing: Total assistance, +2 for physical assistance, Bed level Upper Body Dressing : Total assistance, Bed level Lower Body Dressing: Total assistance, Bed level Toilet Transfer: Maximal assistance, +2 for physical assistance, Stand-pivot Toileting- Clothing Manipulation and Hygiene: Total assistance, +2 for physical assistance, Sit to/from stand Functional mobility during ADLs: Maximal assistance, Rolling walker General ADL Comments: Pt demonstrating progress with functional use of L UE with universal cuff.     Mobility  Overal bed mobility: Needs Assistance Bed Mobility: Rolling, Sidelying to Sit Rolling: Max assist Sidelying to sit: HOB elevated, +2 for physical assistance, Max assist General bed mobility comments: Practiced log roll technique. Max A +2    Transfers  Overall transfer level: Needs assistance Equipment used: 2 person hand held assist Transfers: Sit to/from Stand, Stand Pivot Transfers Sit to Stand: +2 physical assistance, Mod assist Stand pivot transfers: +2 physical assistance, Max assist General transfer comment: Required assistance from OT to move R LE in standing position. OT blocking R knee during rise to standing.     Ambulation / Gait / Stairs / Office manager / Balance Dynamic Sitting Balance Sitting balance - Comments: Able to briefly sit without back support with supervision and single UE support. Required mod assist during functional tasks and  max assist during LE exercises with PT. Balance Overall balance assessment: Needs assistance Sitting-balance support: Bilateral upper extremity supported, Feet supported Sitting balance-Leahy Scale: Poor Sitting balance - Comments: Able to briefly sit without back support with supervision and single UE support. Required mod assist during functional tasks and max assist during LE exercises with PT. Standing balance support: During functional activity, Single extremity supported Standing balance-Leahy Scale: Poor Standing balance comment: Mod assist +2 to maintain standing.    Special needs/care consideration BiPAP/CPAP No CPM No Continuous Drip IV No Dialysis No       Life Vest No Oxygen No Special Bed No Trach Size No Wound Vac (area) No  Skin:  Hard cervical collar in place                             Bowel mgmt: No BM since admission on 08/31/16.  Will need a bowel program implemented. Bladder mgmt: Voiding with incontinence and in urinal  Diabetic mgmt No    Previous Home Environment Living Arrangements: Spouse/significant other  Lives With: Significant other (Lives with girlfriend who does not work.) Available Help at Discharge: Available PRN/intermittently Type of Home: Apartment Home Layout: One level Home Access: Stairs to enter [employer] of Steps: 1 Bathroom Shower/Tub: Chiropodist: Standard Home Care Services: No  Discharge Living Setting Plans for Discharge Living Setting: House, Lives with (comment) (Likely will go home with mom, son and sister.) Type of Home at Discharge: House Discharge Home Layout: One level Discharge Home Access: Level entry (Sliding door at [place] entry.) Does the patient have any problems obtaining your medications?: No  Social/Family/Support Systems Patient Roles: Parent, Other (Comment) (Has GF, son, mom and a sister.) Contact Information: Kelsey Calvi - mother - [phone] Anticipated  Caregiver: Mom, girlfriend, sister, extended family Anticipated Caregiver's Contact Information: Jerrye Noble - GF [phone] Ability/Limitations of Caregiver: Lillia Corporal does not work.  Mom is 68 yo, retired, and is well.  Sister works as a Quarry manager.  Son is 34 yo.  Lillia Corporal has a daughter who is 76 and lives close by. Caregiver Availability: 24/7 Discharge Plan Discussed with Primary Caregiver: Yes Is Caregiver In Agreement with Plan?: Yes Does Caregiver/Family have Issues with Lodging/Transportation while Pt is in Rehab?: No  Goals/Additional Needs Patient/Family Goal for Rehab: PT/OT min to mod assist Expected length of stay: 20-25 days Cultural Considerations: None Dietary Needs: Regular diet, thin liquids Equipment Needs: TBD Pt/Family Agrees to Admission and willing to participate: Yes Program Orientation Provided & Reviewed with Pt/Caregiver Including Roles  & Responsibilities: Yes  Decrease burden of Care through IP rehab admission: N/A  Possible need for SNF placement upon discharge: Not planned  Patient Condition: This patient's medical and functional status has changed since the consult dated: 09/02/16 in which the Rehabilitation Physician determined and documented that the patient's condition is appropriate for intensive rehabilitative care in an inpatient rehabilitation facility. See "History of Present Illness" (above) for medical update. Functional changes are: Currently requiring max assist for mobility and ADLs. Patient's medical and functional status update has been discussed with the Rehabilitation physician and patient remains appropriate for inpatient rehabilitation. Will admit to inpatient rehab today.  Preadmission Screen Completed By:  Patrick Romero, 09/05/2016 12:24 PM ______________________________________________________________________   Discussed status with Dr. Posey Pronto on 09/05/16 at 1223 and received telephone approval for admission today.  Admission Coordinator:   Patrick Romero, time1223/Date2/26/18       Cosigned by: Ankit Lorie Phenix, MD at 09/05/2016 12:27 PM

## 2016-09-06 NOTE — Progress Notes (Signed)
Subjective/Complaints: Patient states that he feels weaker on the left side than on the right side. He has more feeling on the right side than on the left side as well. Review of systems negative for chest pain, shortness breath, nausea, vomiting, diarrhea, nausea and constipation received Objective: Vital Signs: Blood pressure 124/77, pulse 72, temperature 98.9 F (37.2 C), temperature source Oral, resp. rate 17, SpO2 96 %. No results found. No results found for this or any previous visit (from the past 72 hour(s)).   HEENT: Poor dentition Cardio: RRR and No murmurs or extra sounds Resp: CTA B/L and unlabored GI: BS positive and Nontender, nondistended Extremity:  No Edema Skin:   Bruise Left hemifacial Neuro: Alert/Oriented, Abnormal Sensory Reduced light touch in bilateral hands. Intact in bilateral feet and legs, Abnormal Motor 3 minus at the left deltoid, biceps, 2- triceps,, grip 3 minus of the right deltoid muscle, biceps,2- triceps and Abnormal FMC Ataxic/ dec FMC 4 limbs. 3. At the ankle dorsiflexor, plantar flexor, 4 minus. Hip flexion, knee extension Musc/Skel:  Other No pain with upper extremity or lower extremity range of motion. Cervical orthosis. Pain with minimal neck movements Gen.: no acute distress   Assessment/Plan: 1. 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 Physiatrist is providing close team supervision and 24 hour management of active medical problems listed below. Physiatrist and rehab team continue to assess barriers to discharge/monitor patient progress toward functional and medical goals. FIM:       Function - Toileting Toileting steps completed by helper: Adjust clothing prior to toileting, Performs perineal hygiene, Adjust clothing after toileting           Function - Comprehension Comprehension:  Auditory Comprehension assist level: Follows complex conversation/direction with no assist  Function - Expression Expression: Verbal Expression assist level: Expresses complex ideas: With no assist  Function - Social Interaction Social Interaction assist level: Interacts appropriately with others - No medications needed.  Function - Problem Solving Problem solving assist level: Solves basic 90% of the time/requires cueing < 10% of the time  Function - Memory Memory assist level: Complete Independence: No helper, More than reasonable amount of time Patient normally able to recall (first 3 days only): That he or she is in a hospital, Current season Medical Problem List and Plan: 1.  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 Initiate CIR level PT, OT 2.  DVT Prophylaxis/Anticoagulation: Pharmaceutical: Lovenox 3. Pain Management: Continue oxycodone prn for now. Will add ultram for moderate pain. Dysesthesias managed on current dose lyrica.  4. Mood: LCSW to follow for evaluation and support.  5. Neuropsych: This patient is capable of making decisions on his own behalf. 6. Skin/Wound Care:  Air mattress overlay. Pressure relief measures.  7. Fluids/Electrolytes/Nutrition: Monitor I/O.  BMET normal    Component Value Date/Time   NA 136 09/06/2016 0732   K 4.6 09/06/2016 0732   CL 99 (L) 09/06/2016 0732   CO2 28 09/06/2016 0732   GLUCOSE 94 09/06/2016 0732   BUN 12 09/06/2016 0732   CREATININE 1.02 09/06/2016 0732   CALCIUM 9.6 09/06/2016 0732   GFRNONAA >60 09/06/2016 0732   GFRAA >60 09/06/2016 0732   8. History of presyncope/syncope: Monitor for now. 9. Anemia: question of chronic disease due to Alcohol use.Normocytic, hemoglobin mildly decreased, check stool. Guaiac  Will check anemia panel.  CBC    Component Value Date/Time   WBC 7.8  09/06/2016 0732   RBC 4.19 (L) 09/06/2016 0732   HGB 10.9 (L) 09/06/2016 0732   HCT 33.7 (L) 09/06/2016 0732   PLT 288  09/06/2016 0732   MCV 80.4 09/06/2016 0732   MCH 26.0 09/06/2016 0732   MCHC 32.3 09/06/2016 0732   RDW 13.5 09/06/2016 0732   LYMPHSABS 2.2 09/06/2016 0732   MONOABS 0.9 09/06/2016 0732   EOSABS 0.3 09/06/2016 0732   BASOSABS 0.0 09/06/2016 0732   10. Mid chest wall pain:  likely musculoskeletal in nature. Monitor for now.  11. Right renal cyst: Will need non-emergent follow up ultrasound for work up. 12. ETOH abuse/Tobacco: Counsel   LOS (Days) 1 A FACE TO FACE EVALUATION WAS PERFORMED  Patrick Romero E 09/06/2016, 7:35 AM

## 2016-09-06 NOTE — Progress Notes (Signed)
Occupational Therapy Session Note  Patient Details  Name: Patrick Romero MRN: [id number] Date of Birth: 1960/09/23  Today's Date: 09/06/2016 OT Individual Time: 1430-1500 OT Individual Time Calculation (min): 30 min    Short Term Goals: Week 1:  OT Short Term Goal 1 (Week 1): Pt will be able to sit to stand with mod A of 1 to prepare for toileting.  OT Short Term Goal 2 (Week 1): Pt will be able to transfer to toilet with mod A of 1. OT Short Term Goal 3 (Week 1): Pt will be able to use his LUE as a stabilizing A when donning a shirt. OT Short Term Goal 4 (Week 1): Pt will be able to maintain sitting balance with close S for at least 10 min to enable him to sit safely on a tub bench.  Skilled Therapeutic Interventions/Progress Updates:  Pt seen for OT session with use of E-Stim for UE wrist extensors. Pt in supine upon arrival, agreeable to tx session. He transferred to EOB with assist for R LE and to bring trunk upright. Pt sat EOB throughout session, initially requiring min A initially, progressing to supervision with VCs for anterior weight shift to find CoB.  E-stim competed on B wrist extensors. No noted movements/ trace in wrist during or following E-stim. Pt with active movement in all digits, however, unable to isolate finger motion from radial/ ulnar nerve innervation areas.  Pt displayed ability of elbow flexion/ extension with gravity eliminated in L UE, a precursor to self feeding with use of dorsal wrist support universal cuff. Will benefit from AE in future sessions. Reviewed with pt POC, CIR, and d/c planning. He returned to supine at end of session, left with soft call bell in reach and all needs in reach.   Therapy Documentation Precautions:  Precautions Precautions: Cervical Required Braces or Orthoses: Cervical Brace Cervical Brace: Hard collar Restrictions Weight Bearing Restrictions: No Other Position/Activity Restrictions: Pt with very weak core ADL: ADL ADL  Comments: refer to functional navigator  See Function Navigator for Current Functional Status.   Therapy/Group: Individual Therapy  [name], Mylen Mangan C 09/06/2016, 3:31 PM

## 2016-09-06 NOTE — Plan of Care (Signed)
Problem: SCI BOWEL ELIMINATION Goal: RH STG MANAGE BOWEL WITH ASSISTANCE STG Manage Bowel with  min Assistance.  Outcome: Not Progressing Pt will be given laxative this am

## 2016-09-06 NOTE — Progress Notes (Signed)
Patient information reviewed and entered into eRehab system by Regis Hinton, RN, CRRN, PPS Coordinator.  Information including medical coding and functional independence measure will be reviewed and updated through discharge.    

## 2016-09-06 NOTE — Progress Notes (Signed)
**  Preliminary report by tech**  Bilateral lower extremity venous duplex complete. There is evidence of acute deep vein thrombosis involving the posterior tibial veins of the right lower extremity. There is no evidence of superficial vein thrombosis involving the right lower extremity. There is no evidence of deep or superficial vein thrombosis involving the left lower extremity. There is no evidence of a Baker's cyst bilaterally. Results were given to the patient's nurse, Santiago Glad.  09/06/16 1:49 PM Patrick Romero RVT

## 2016-09-06 NOTE — Evaluation (Signed)
Physical Therapy Assessment and Plan  Patient Details  Name: Patrick Romero MRN: [id number] Date of Birth: [birth year]  PT Diagnosis: Abnormal posture, Abnormality of gait, Ataxia, Ataxic gait, Coordination disorder, Difficulty walking, Impaired sensation, Muscle spasms, Muscle weakness and Quadriplegia Rehab Potential: Good ELOS: 4 weeks   Today's Date: 09/06/2016 PT Individual Time: 0900-1000 PT Individual Time Calculation (min): 60 min    Problem List:  Patient Active Problem List   Diagnosis Date Noted  . Cervical myelopathy with cervical radiculopathy 09/05/2016  . Tetraparesis ([place])   . Central cord syndrome ([place])   . Anemia   . Renal cyst   . ETOH abuse   . Chest wall pain   . Neuropathic pain   . Neck pain   . Chronic neck and back pain 09/01/2016  . Alcohol intoxication ([place]) 09/01/2016  . Tobacco abuse 09/01/2016  . Paraplegia following spinal cord injury ([place]) 09/01/2016    Past Medical History:  Past Medical History:  Diagnosis Date  . Chronic back pain   . Chronic neck pain   . Herniated disc, cervical    Past Surgical History: History reviewed. No pertinent surgical history.  Assessment & Plan Clinical Impression: A 56 y.o.malewho was found unresponsive behind [facility] with LUE weakness and lack of movement BLE and RUE as well as poor rectal tone. ETOH level 324. 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 He was evaluated by Dr. Christella Noa who recommended cervical collar for support and Lyrica added to help manage dysesthesias. He has had improvement in motor strength but patient with deficits in mobility and self care tasks due to quadriplegia. CIR recommended for follow up therapy  Patient transferred to CIR on 09/05/2016 .   Patient currently requires max with mobility secondary  to muscle weakness and muscle paralysis, decreased cardiorespiratoy endurance, impaired timing and sequencing, abnormal tone, unbalanced muscle activation, ataxia and decreased coordination and decreased sitting balance, decreased standing balance, decreased postural control and decreased balance strategies.  Prior to hospitalization, patient was independent  with mobility and lived with Significant other (plans to move to mother's house with sister) in a House home.  Home access is   .  Patient will benefit from skilled PT intervention to maximize safe functional mobility, minimize fall risk and decrease caregiver burden for planned discharge home with 24 hour supervision.  Anticipate patient will benefit from follow up [place] at discharge.  PT - End of Session Activity Tolerance: Tolerates 30+ min activity with multiple rests Endurance Deficit: Yes Endurance Deficit Description: rest breaks following all mobility activities PT Assessment Rehab Potential (ACUTE/IP ONLY): Good Barriers to Discharge: Decreased caregiver support PT Patient demonstrates impairments in the following area(s): Balance;Pain;Safety;Endurance;Sensory;Motor;Skin Integrity PT Transfers Functional Problem(s): Bed Mobility;Bed to Chair;Car PT Locomotion Functional Problem(s): Wheelchair Mobility PT Plan PT Intensity: Minimum of 1-2 x/day ,45 to 90 minutes PT Frequency: 5 out of 7 days PT Duration Estimated Length of Stay: 4 weeks PT Treatment/Interventions: Ambulation/gait training;Balance/vestibular training;Community reintegration;Discharge planning;Disease management/prevention;DME/adaptive equipment instruction;Functional mobility training;Neuromuscular re-education;Patient/family education;Pain management;Psychosocial support;Skin care/wound management;Stair training;Splinting/orthotics;Therapeutic Activities;Therapeutic Exercise;UE/LE Strength taining/ROM;UE/LE Coordination activities;Wheelchair propulsion/positioning PT  Transfers Anticipated Outcome(s): S w/c level PT Locomotion Anticipated Outcome(s): modI w/c management, minA standing PT Recommendation Recommendations for Other Services: Therapeutic Recreation consult Therapeutic Recreation Interventions: Pet therapy;Outing/community reintergration Follow Up Recommendations: Home health PT Patient destination: Home Equipment Recommended: To be determined  Skilled Therapeutic Intervention Pt received supine in bed; c/o pain as below in shoulders/neck and agreeable to treatment. PT initial evaluation performed and completed as below with mod/maxA +2 due to UE/LE, core stability and coordination impairments. Educated pt in rehab process, goals at w/c level to be reassessed pending progress, estimated LOS, falls prevention safety; pt agreeable to all the above. Returned to bed at end of session; remained supine with all needs in reach at completion of session.   PT Evaluation Precautions/Restrictions Precautions Precautions: Cervical Required Braces or Orthoses: Cervical Brace Cervical Brace: Hard collar Restrictions Other Position/Activity Restrictions: Pt with very weak core General Chart Reviewed: Yes Response to Previous Treatment: Not applicable Family/Caregiver Present: No  Pain Pain Assessment Pain Assessment: 0-10 Pain Score: 7  Pain Type: Acute pain Pain Location: Neck Pain Orientation: Right;Left;Anterior;Posterior Pain Descriptors / Indicators: Aching Pain Intervention(s): Distraction Home Living/Prior Functioning Home Living Available Help at Discharge: Family Type of Home: House Home Layout: One level Bathroom Shower/Tub: Tub/shower unit;Walk-in shower;Curtain  Lives With: Significant other (plans to move to mother's house with sister) Prior Function Level of Independence: Independent with basic ADLs;Independent with gait;Requires assistive device for independence  Able to Take Stairs?: Yes Driving: No Vocation Requirements:  worked at landfill Comments: 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 drive, uses bus Vision/Perception  Perception Comments: WFL  Cognition Overall Cognitive Status: Within Functional Limits for tasks assessed Arousal/Alertness: Awake/alert Memory: Appears intact Sensation Sensation Light Touch: Impaired by gross assessment (pt can feel light touch on BUE, but slightly impaired for specific light touch) Stereognosis: Impaired by gross assessment Hot/Cold: Appears Intact Proprioception: Impaired by gross assessment Coordination Gross Motor Movements are Fluid and Coordinated: No Fine Motor Movements are Fluid and Coordinated: No Coordination and Movement Description: active movement in scapula, partial in L shoulder/elbow in gravity eliminated positions Finger Nose Finger Test: unable to perform Motor  Motor Motor: Tetraplegia Motor - Skilled Clinical Observations: 1/5 in B shoulders, 1/5 L elbow flexor, 0/5 B hands  Mobility Bed Mobility Bed Mobility: Rolling Right;Sit to Supine;Supine to Sit Rolling Right: 4: Min assist Rolling Right Details: Tactile cues for sequencing;Tactile cues for placement;Verbal cues for technique Supine to Sit: 2: Max assist;HOB flat Supine to Sit Details: Verbal cues for technique;Manual facilitation for weight shifting;Manual facilitation for placement;Tactile cues for sequencing;Tactile cues for weight shifting;Verbal cues for sequencing Sit to Supine: 4: Min assist;HOB flat Sit to Supine - Details: Verbal cues for technique;Tactile cues for placement;Manual facilitation for placement Transfers Transfers: Yes Sit to Stand: 1: +2 Total assist;From bed Sit to Stand Details: Verbal cues for sequencing;Verbal cues for technique;Tactile cues for posture;Tactile cues for weight shifting;Tactile cues for initiation Stand to Sit: 1: +1 Total assist Stand to Sit Details (indicate cue type and reason): Verbal cues for sequencing;Verbal cues for technique Stand to Sit Details: totalA  for eccentric control Lateral/Scoot Transfers: 2: Max assist;1: +2 Total assist Lateral/Scoot Transfer Details: Verbal cues for technique;Verbal cues for precautions/safety;Verbal cues for sequencing;Tactile cues for posture;Tactile cues for placement;Tactile cues for weight shifting Locomotion  Ambulation Ambulation: Yes Ambulation/Gait Assistance: 1: +2 Total assist Ambulation Distance (Feet): 20 Feet Assistive device: Other (Comment) ("three muskateers") Ambulation/Gait Assistance Details: Verbal cues for technique;Tactile cues for placement;Tactile cues for weight beaing Ambulation/Gait Assistance Details: assist for RLE management to reduce scissoring, knee stance control to prevent buckling Gait Gait: Yes Gait Pattern: Impaired Gait Pattern: Scissoring;Ataxic;Left genu recurvatum;Right genu recurvatum;Right flexed knee in stance;Left flexed knee in stance;Poor foot clearance - left;Narrow base of support;Poor foot clearance -  right;Decreased stride length;Step-to pattern;Decreased weight shift to right Gait velocity: significantly decreased for age norms Stairs / Additional Locomotion Stairs: No Architect: Yes Wheelchair Assistance: 3: Building surveyor Details: Tactile cues for weight shifting;Tactile cues for placement (assist for R hip flexion ) Wheelchair Propulsion: Both lower extermities Wheelchair Parts Management: Needs assistance Distance: 43'  Trunk/Postural Assessment  Cervical Assessment Cervical Assessment:  (wears hard C collar) Postural Control Postural Control: Deficits on evaluation Trunk Control: severely impaired due to weak core  Balance Static Sitting Balance Static Sitting - Level of Assistance: 4: Min assist;Other (comment) (can sit with S after he sits for a minute with support) Dynamic Sitting Balance Dynamic Sitting - Level of Assistance: 1: +1 Total assist Static Standing Balance Static Standing - Level  of Assistance: 1: +2 Total assist Dynamic Standing Balance Dynamic Standing - Level of Assistance: 1: +2 Total assist Extremity Assessment  RUE Assessment RUE Assessment: Exceptions to [facility] RUE AROM (degrees) RUE Overall AROM Comments: partial scapular elevation and shoulder protraction with arm in gravity eliminated but no active elbow to fingers. LUE Assessment LUE Assessment: Exceptions to WFL LUE AROM (degrees) LUE Overall AROM Comments: partial scapular elevation, shoulder protraction, elbow flexion with arm in gravity eliminated position, no active wrist or fingers RLE Assessment RLE Assessment: Exceptions to [facility] RLE Strength RLE Overall Strength: Deficits Right Hip Flexion: 1/5 Right Knee Flexion: 3/5 Right Knee Extension: 3/5 Right Ankle Dorsiflexion: 0/5 Right Ankle Plantar Flexion: 1/5 LLE Assessment LLE Assessment: Exceptions to WFL LLE Strength LLE Overall Strength: Deficits Left Hip Flexion: 3/5 Left Knee Flexion: 3/5 Left Knee Extension: 4-/5 Left Ankle Dorsiflexion: 3+/5 Left Ankle Plantar Flexion: 4-/5   See Function Navigator for Current Functional Status.   Refer to Care Plan for Long Term Goals  Recommendations for other services: Neuropsych and Therapeutic Recreation  Pet therapy and Outing/community reintegration  Discharge Criteria: Patient will be discharged from PT if patient refuses treatment 3 consecutive times without medical reason, if treatment goals not met, if there is a change in medical status, if patient makes no progress towards goals or if patient is discharged from hospital.  The above assessment, treatment plan, treatment alternatives and goals were discussed and mutually agreed upon: by patient  Luberta Mutter 09/06/2016, 2:58 PM

## 2016-09-06 NOTE — Progress Notes (Signed)
Physical Therapy Session Note  Patient Details  Name: Patrick Romero MRN: [id number] Date of Birth: 1960-08-03  Today's Date: 09/06/2016 PT Individual Time: [id number] PT Individual Time Calculation (min): 69 min    Skilled Therapeutic Interventions/Progress Updates:  Pt received in bed & agreeable to tx. Pt noted 7/10 pain in B shoulders & RN administered meds. Pt transferred supine>sitting EOB with mod assist for upper body, as pt able to transfer BLE to EOB. Pt completed slide board transfer bed>w/c with max assist with total assist for placement of board & w/c set up. Pt requires max assist for balance, cuing for anterior weight shifting and instructions for technique for transfer. Transported pt to ortho gym where pt completed car transfer to low sedan simulated height with slide board in same manner as noted above. Pt able to assist with transferring BLE in/out of car, with RLE weaker than LLE. Transitioned to dayroom and attempted to transfer onto tilt table with various methods, ultimately using maximove for safety. Once on tilt table pt went to full upright position, as pt had participated in ambulation during earlier PT session without any symptoms. Once upright pt's vitals as follows:  60 degrees: BP = 70/36 mmHg, HR = 123 bpm Returned to 20 degrees: BP = 85/56 mmHg, HR = 91 bpm 0 degrees initially: BP = 100/68 mmHg, HR = 73 bpm 0 degrees after ~2 minutes: BP = 101/71 mmHg, HR = 73 bpm 0 degrees after ~4 minutes: BP = 95/66, HR = 73 bpm Pt symptomatic throughout & beginning to report "seeing black spots" while at 0 degrees after ~5 minutes. RN made aware & pt transferred back to w/c via maximove. Returned pt to room & transferred to bed via [facility] for safety. Once supine in bed pt reporting to feel well again. Pt left in care of RN.   Therapy Documentation Precautions:  Precautions Precautions: Cervical Required Braces or Orthoses: Cervical Brace Cervical Brace: Hard  collar Restrictions Weight Bearing Restrictions: No Other Position/Activity Restrictions: Pt with very weak core   See Function Navigator for Current Functional Status.   Therapy/Group: Individual Therapy  [name] 09/06/2016, 5:18 PM

## 2016-09-06 NOTE — Progress Notes (Signed)
No K-pads available within hospital at this time.

## 2016-09-07 ENCOUNTER — Inpatient Hospital Stay (HOSPITAL_COMMUNITY): Payer: Self-pay | Admitting: Physical Therapy

## 2016-09-07 ENCOUNTER — Inpatient Hospital Stay (HOSPITAL_COMMUNITY): Payer: Self-pay | Admitting: *Deleted

## 2016-09-07 ENCOUNTER — Inpatient Hospital Stay (HOSPITAL_COMMUNITY): Payer: Self-pay | Admitting: Occupational Therapy

## 2016-09-07 ENCOUNTER — Inpatient Hospital Stay (HOSPITAL_COMMUNITY): Payer: Medicaid Other | Admitting: Occupational Therapy

## 2016-09-07 DIAGNOSIS — G8250 Quadriplegia, unspecified: Secondary | ICD-10-CM

## 2016-09-07 DIAGNOSIS — S14129D Central cord syndrome at unspecified level of cervical spinal cord, subsequent encounter: Principal | ICD-10-CM

## 2016-09-07 NOTE — Progress Notes (Signed)
Subjective/Complaints: Moving left side better than right side. This has been since injury. Discuss with PT, having some orthostatic blood pressure drops on the tilt table down to 70 systolic yesterday. Patient now wearing an abdominal binder and TED hose Review of systems negative for chest pain, shortness breath, nausea, vomiting, diarrhea, nausea and constipation received Objective: Vital Signs: Blood pressure 104/72, pulse 76, temperature 99 F (37.2 C), temperature source Oral, resp. rate 18, SpO2 96 %. No results found. Results for orders placed or performed during the hospital encounter of 09/05/16 (from the past 72 hour(s))  CBC WITH DIFFERENTIAL     Status: Abnormal   Collection Time: 09/06/16  7:32 AM  Result Value Ref Range   WBC 7.8 4.0 - 10.5 K/uL   RBC 4.19 (L) 4.22 - 5.81 MIL/uL   Hemoglobin 10.9 (L) 13.0 - 17.0 g/dL   HCT 33.7 (L) 39.0 - 52.0 %   MCV 80.4 78.0 - 100.0 fL   MCH 26.0 26.0 - 34.0 pg   MCHC 32.3 30.0 - 36.0 g/dL   RDW 13.5 11.5 - 15.5 %   Platelets 288 150 - 400 K/uL   Neutrophils Relative % 56 %   Neutro Abs 4.4 1.7 - 7.7 K/uL   Lymphocytes Relative 29 %   Lymphs Abs 2.2 0.7 - 4.0 K/uL   Monocytes Relative 12 %   Monocytes Absolute 0.9 0.1 - 1.0 K/uL   Eosinophils Relative 3 %   Eosinophils Absolute 0.3 0.0 - 0.7 K/uL   Basophils Relative 0 %   Basophils Absolute 0.0 0.0 - 0.1 K/uL  Comprehensive metabolic panel     Status: Abnormal   Collection Time: 09/06/16  7:32 AM  Result Value Ref Range   Sodium 136 135 - 145 mmol/L   Potassium 4.6 3.5 - 5.1 mmol/L   Chloride 99 (L) 101 - 111 mmol/L   CO2 28 22 - 32 mmol/L   Glucose, Bld 94 65 - 99 mg/dL   BUN 12 6 - 20 mg/dL   Creatinine, Ser 1.02 0.61 - 1.24 mg/dL   Calcium 9.6 8.9 - 10.3 mg/dL   Total Protein 7.0 6.5 - 8.1 g/dL   Albumin 3.2 (L) 3.5 - 5.0 g/dL   AST 54 (H) 15 - 41 U/L   ALT 49 17 - 63 U/L   Alkaline Phosphatase 68 38 - 126 U/L   Total Bilirubin 0.7 0.3 - 1.2 mg/dL   GFR calc  non Af Amer >60 >60 mL/min   GFR calc Af Amer >60 >60 mL/min    Comment: (NOTE) The eGFR has been calculated using the CKD EPI equation. This calculation has not been validated in all clinical situations. eGFR's persistently <60 mL/min signify possible Chronic Kidney Disease.    Anion gap 9 5 - 15     HEENT: Poor dentition Cardio: RRR and No murmurs or extra sounds Resp: CTA B/L and unlabored GI: BS positive and Nontender, nondistended Extremity:  No Edema Skin:   Bruise Left hemifacial Neuro: Alert/Oriented, Abnormal Sensory Reduced light touch in bilateral hands. Intact in bilateral feet and legs, Abnormal Motor 3 minus at the left deltoid, biceps, 2- triceps,, grip 3 minus of the right deltoid muscle, biceps,2- triceps and Abnormal FMC Ataxic/ dec FMC 4 limbs. 3. At the ankle dorsiflexor, plantar flexor, 4 minus. Hip flexion, knee extension Musc/Skel:  Other No pain with upper extremity or lower extremity range of motion. Cervical orthosis. Pain with minimal neck movements Gen.: no acute distress   Assessment/Plan:  1. 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 Physiatrist is providing close team supervision and 24 hour management of active medical problems listed below. Physiatrist and rehab team continue to assess barriers to discharge/monitor patient progress toward functional and medical goals. FIM: Function - Bathing Position: Wheelchair/chair at sink Body parts bathed by helper: Right arm, Left arm, Chest, Abdomen, Front perineal area, Buttocks, Right upper leg, Left upper leg, Right lower leg, Left lower leg, Back Assist Level: 2 helpers  Function- Upper Body Dressing/Undressing What is the patient wearing?: Pull over shirt/dress Pull over shirt/dress - Perfomed by helper: Thread/unthread right sleeve, Thread/unthread left sleeve, Put head through  opening, Pull shirt over trunk Function - Lower Body Dressing/Undressing What is the patient wearing?: Pants, Ted Hose, Non-skid slipper socks Position: Wheelchair/chair at sink Pants- Performed by helper: Thread/unthread right pants leg, Thread/unthread left pants leg, Pull pants up/down Non-skid slipper socks- Performed by helper: Don/doff right sock, Don/doff left sock TED Hose - Performed by helper: Don/doff right TED hose, Don/doff left TED hose Assist for lower body dressing: 2 Helpers  Function - Toileting Toileting steps completed by helper: Adjust clothing prior to toileting, Performs perineal hygiene, Adjust clothing after toileting Assist level: Two helpers  Function - Air cabin crew transfer assistive device: Elevated toilet seat/BSC over toilet Assist level to toilet: 2 helpers (stand pivot) Assist level from toilet: 2 helpers (stand pivot with mod-max A of 2)  Function - Chair/bed transfer Chair/bed transfer method: Lateral scoot Chair/bed transfer assist level: Maximal assist (Pt 25 - 49%/lift and lower) Chair/bed transfer assistive device: Sliding board Chair/bed transfer details: Manual facilitation for weight shifting, Manual facilitation for placement, Tactile cues for sequencing, Tactile cues for weight shifting, Tactile cues for posture, Tactile cues for initiation, Verbal cues for sequencing, Verbal cues for technique     Function - Comprehension Comprehension: Auditory Comprehension assist level: Follows complex conversation/direction with no assist  Function - Expression Expression: Verbal Expression assist level: Expresses complex ideas: With no assist  Function - Social Interaction Social Interaction assist level: Interacts appropriately with others - No medications needed.  Function - Problem Solving Problem solving assist level: Solves basic 90% of the time/requires cueing < 10% of the time  Function - Memory Memory assist level: Complete  Independence: No helper Patient normally able to recall (first 3 days only): That he or she is in a hospital, Current season Medical Problem List and Plan: 1.  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 Initiate CIR level PT, OT 2.  DVT Prophylaxis/Anticoagulation: Pharmaceutical: Lovenox 3. Pain Management: Continue oxycodone prn for now. Will add ultram for moderate pain. Dysesthesias managed on current dose lyrica.  4. Mood: LCSW to follow for evaluation and support.  5. Neuropsych: This patient is capable of making decisions on his own behalf. 6. Skin/Wound Care:  Air mattress overlay. Pressure relief measures.  7. Fluids/Electrolytes/Nutrition: Monitor I/O.  BMET normal, hydration status looks okay, no need for IV fluids    Component Value Date/Time   NA 136 09/06/2016 0732   K 4.6 09/06/2016 0732   CL 99 (L) 09/06/2016 0732   CO2 28 09/06/2016 0732   GLUCOSE 94 09/06/2016 0732   BUN 12 09/06/2016 0732   CREATININE 1.02 09/06/2016 0732   CALCIUM 9.6 09/06/2016 0732   GFRNONAA >60 09/06/2016 0732   GFRAA >60 09/06/2016 0732   8. History of presyncope/syncope: Monitor for now.Unclear whether  that the orthostatic changes are separate issue. Certainly this would be common after spinal cord injury 9. Anemia: question of chronic disease due to Alcohol use.Normocytic, hemoglobin mildly decreased, check stool. Guaiac  Anemia is not severe enough to be causing orthostatic changes. CBC    Component Value Date/Time   WBC 7.8 09/06/2016 0732   RBC 4.19 (L) 09/06/2016 0732   HGB 10.9 (L) 09/06/2016 0732   HCT 33.7 (L) 09/06/2016 0732   PLT 288 09/06/2016 0732   MCV 80.4 09/06/2016 0732   MCH 26.0 09/06/2016 0732   MCHC 32.3 09/06/2016 0732   RDW 13.5 09/06/2016 0732   LYMPHSABS 2.2 09/06/2016 0732   MONOABS 0.9 09/06/2016 0732   EOSABS 0.3 09/06/2016 0732   BASOSABS 0.0 09/06/2016 0732   10. Mid chest wall pain:  likely musculoskeletal in nature. Monitor for now.   11. Right renal cyst: Will need non-emergent follow up ultrasound for work up. 12. ETOH abuse/Tobacco: Counsel   LOS (Days) 2 A FACE TO FACE EVALUATION WAS PERFORMED  Ison Wichmann E 09/07/2016, 12:01 PM

## 2016-09-07 NOTE — Progress Notes (Signed)
Orthopedic Tech Progress Note Patient Details:  Patrick Romero [birth year] [id number]  [name], Zayli Villafuerte 09/07/2016, 8:36 AM

## 2016-09-07 NOTE — Evaluation (Signed)
Recreational Therapy Assessment and Plan  Patient Details  Name: Patrick Romero MRN: [id number] Date of Birth: 05/30/1961 Today's Date: 09/07/2016  Rehab Potential: Good ELOS: 3 weeks   Assessment Clinical Impression:Problem List:      Patient Active Problem List   Diagnosis Date Noted  . Cervical myelopathy with cervical radiculopathy 09/05/2016  . Tetraparesis ([place])   . Central cord syndrome ([place])   . Anemia   . Renal cyst   . ETOH abuse   . Chest wall pain   . Neuropathic pain   . Neck pain   . Chronic neck and back pain 09/01/2016  . Alcohol intoxication ([place]) 09/01/2016  . Tobacco abuse 09/01/2016  . Paraplegia following spinal cord injury ([place]) 09/01/2016    Past Medical History:      Past Medical History:  Diagnosis Date  . Chronic back pain   . Chronic neck pain   . Herniated disc, cervical    Past Surgical History: History reviewed. No pertinent surgical history.  Assessment & Plan Clinical Impression: A55 y.o.malewho was found unresponsive behind [facility] with LUE weakness and lack of movement BLE and RUE as well as poor rectal tone. ETOH level 324. 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 He was evaluated by Dr. Christella Noa who recommended cervical collar for support and Lyrica added to help manage dysesthesias. He has had improvement in motor strength but patient with deficits in mobility and self care tasks due to quadriplegia. CIR recommended for follow up therapy  Patient transferred to CIR on 09/05/2016 .   Pt presents with decreased activity tolerance, decreased functional mobility, decreased balance, decreased coordination Limiting pt's independence with leisure/community pursuits.  Leisure History/Participation Premorbid leisure interest/current  participation: [place] store;Community - Travel (Comment);Nature - Fishing;Sports - Exercise (Comment);Sports - Basketball;Sports - Building services engineer Expression Interests: Music (Comment) Other Leisure Interests: Television;Movies Leisure Participation Style: With Family/Friends Awareness of Community Resources: Excellent Psychosocial / Spiritual Patient agreeable to Pet Therapy: Yes Social interaction - Mood/Behavior: Cooperative Academic librarian Appropriate for Education?: Yes Patient Agreeable to Outing?: Yes Recreational Therapy Orientation Orientation -Reviewed with patient: Available activity resources Strengths/Weaknesses Patient Strengths/Abilities: Willingness to participate;Active premorbidly Patient weaknesses: Physical limitations TR Patient demonstrates impairments in the following area(s): Endurance;Motor;Pain;Safety;Skin Integrity  Plan Rec Therapy Plan Is patient appropriate for Therapeutic Recreation?: Yes Rehab Potential: Good Treatment times per week: Min 1 TR session/group >30 minutes during LOS Estimated Length of Stay: 3 weeks TR Treatment/Interventions: Adaptive equipment instruction;1:1 session;Balance/vestibular training;Functional mobility training;Community reintegration;Group participation (Comment);Patient/family education;Therapeutic activities;Recreation/leisure participation;Therapeutic exercise;UE/LE Coordination activities;Wheelchair propulsion/positioning  Recommendations for other services: None   Discharge Criteria: Patient will be discharged from TR if patient refuses treatment 3 consecutive times without medical reason.  If treatment goals not met, if there is a change in medical status, if patient makes no progress towards goals or if patient is discharged from hospital.  The above assessment, treatment plan, treatment alternatives and goals were discussed and mutually agreed upon: by  patient  [place] 09/07/2016, 4:11 PM

## 2016-09-07 NOTE — Progress Notes (Signed)
Occupational Therapy Session Note  Patient Details  Name: Patrick Romero MRN: [id number] Date of Birth: [birth year]  Today's Date: 09/07/2016 OT Individual Time: [id number] OT Individual Time Calculation (min): 32 min    Short Term Goals: Week 1:  OT Short Term Goal 1 (Week 1): Pt will be able to sit to stand with mod A of 1 to prepare for toileting.  OT Short Term Goal 2 (Week 1): Pt will be able to transfer to toilet with mod A of 1. OT Short Term Goal 3 (Week 1): Pt will be able to use his LUE as a stabilizing A when donning a shirt. OT Short Term Goal 4 (Week 1): Pt will be able to maintain sitting balance with close S for at least 10 min to enable him to sit safely on a tub bench.  Skilled Therapeutic Interventions/Progress Updates:    Pt completed supine to sit and stand pivot transfer to the wheelchair to begin session.  NT had just checked vitals at start of session and BP was stable at 123/83 with HOB elevated.  Worked on BUE AAROM exercises for right and left wrist flexion/extension for 10 repetitions each as well as right elbow flexion/extension for 1 set of 10 repetitions.  Noted trace movements in the right  Biceps but most movement was initiated from the shoulder.  Next positioned universal cuff with wrist support on the LUE to work on some self feeding.  Therapist attempted to bend utensils to allow for better positioning to scoop cake to mouth.  He still needed overall max assist to scoop food but could bring to mouth with mod assist.  Still needs some adaptation to utensils however to figure out the best position.  Finished session with pt in reclining back wheelchair and soft touch call button in lap.  Safety belt in place as well.   Therapy Documentation Precautions:  Precautions Precautions: Cervical Required Braces or Orthoses: Cervical Brace Cervical Brace: Hard collar Restrictions Weight Bearing Restrictions: No Other Position/Activity Restrictions: Pt with very weak  core Vital Signs: Therapy Vitals Temp: 98.7 F (37.1 C) Temp Source: Oral Pulse Rate: 86 Resp: 17 BP: 123/83 Patient Position (if appropriate): Lying Oxygen Therapy SpO2: 98 % O2 Device: Not Delivered Pain: Pain Assessment Pain Assessment: No/denies pain ADL: ADL ADL Comments: refer to functional navigator  See Function Navigator for Current Functional Status.   Therapy/Group: Individual Therapy  Arpita Fentress OTR/L 09/07/2016, 2:40 PM

## 2016-09-07 NOTE — Progress Notes (Signed)
Physical Therapy Session Note  Patient Details  Name: Patrick Romero MRN: [id number] Date of Birth: 06/17/1961   Today's Date: 09/07/2016 PT Individual Time: 2426-8341 PT Individual Time Calculation (min): 30 min   Short Term Goals: Week 1:  PT Short Term Goal 1 (Week 1): Pt will perform bed mobility minA PT Short Term Goal 2 (Week 1): Pt will perform transfers consistent minA PT Short Term Goal 3 (Week 1): Pt will ambulate maxA +1 PT Short Term Goal 4 (Week 1): Pt will initiate stair training PT Short Term Goal 5 (Week 1): Pt will demonstrate OOB tolerance 3 hrs/day outside of therapy  Skilled Therapeutic Interventions/Progress Updates:   PTdonned ted hose and socks supine in bed; pt able to raise BLE off bed to assist PT to don   Orthostatic BP assessed. Supine, 103/72. Sitting 104/72. Pt Asymptomatic throughout dressing and transfers.   Squat pivot transfer to [facility] with max assist from PT to faciliate lateral pelvic mobility. Patient able to initiate anterior weight shift and sit<>stand, but required max cues for proper positioning and increased weight shift. PT instructed patient in positioning in [facility] with mod cues for proper head/hips relationship to allow improved posterior positioning.   WC mobility with max assist using BLE for propulsion x59f. PT required to control direction throughout entire distance due to poor foot clearance and increased adduction.   [facility] and all needs met.        Therapy Documentation Precautions:  Precautions Precautions: Cervical Required Braces or Orthoses: Cervical Brace Cervical Brace: Hard collar Restrictions Weight Bearing Restrictions: No Other Position/Activity Restrictions: Pt with very weak core    Vital Signs: Therapy Vitals Temp: 99 F (37.2 C) Temp Source: Oral Pulse Rate: 76 Resp: 18 BP: 104/72 Patient Position (if appropriate): Lying Oxygen Therapy SpO2:  96 % O2 Device: Not Delivered   See Function Navigator for Current Functional Status.   Therapy/Group: Individual Therapy  ALorie Phenix2/28/2018, 7:45 AM

## 2016-09-07 NOTE — Progress Notes (Signed)
Occupational Therapy Session Note  Patient Details  Name: Patrick Romero MRN: [id number] Date of Birth: 19-Aug-1960  Today's Date: 09/07/2016 OT Individual Time: [id number] OT Individual Time Calculation (min): 75 min    Short Term Goals: Week 1:  OT Short Term Goal 1 (Week 1): Pt will be able to sit to stand with mod A of 1 to prepare for toileting.  OT Short Term Goal 2 (Week 1): Pt will be able to transfer to toilet with mod A of 1. OT Short Term Goal 3 (Week 1): Pt will be able to use his LUE as a stabilizing A when donning a shirt. OT Short Term Goal 4 (Week 1): Pt will be able to maintain sitting balance with close S for at least 10 min to enable him to sit safely on a tub bench.  Skilled Therapeutic Interventions/Progress Updates:    Pt seen for OT session focusing on UE ROM, positioning and transfers. Pt in w/c with hand off from PT, BP 106/69. Pt voiced increased pain in neck, however, reports being pre-medicated prior to tx session.  In therapy gym, completed stand pivot transfer with mod A +2 with assist to advance R LE due to ataxia.  He sat EOM with supervision, occasional LOB, however, able to self-correct. Completed towel pushes on elevated table addressing L bicep/tricep activation and then shoulder protraction/ retraction. Pt voiced increased pain in neck and then complaints of dizziness. BP 102/67. Returned to supine on mat and abdominal binder donned. Following supine rest break, pt returned to EOM. Introduced dorsal wrist support universal cuff and attempted simulated self feeding task with L UE. Will plan therapy meal for later this week.  Worked on various transfer methods including squat pivot, stand pivot, or use of STEADY for nursing to utilize for increased safety. Recommending +2 for all transfer methods (not with therapy) due to pt's decreased core balance and ataxia. Pt returned to room at end of session, squat pivot of 1 assist to return to EOB. Pt left in supine,  all needs in reach.  Provided pt with reclining back w/c to use for rest btwn therapy sessions for increased support at neck and shoulders.  Pt also voiced last night having  increased dizziness/ hallucinations, thinking it is related to pain meds. RN made aware.    Therapy Documentation Precautions:  Precautions Precautions: Cervical Required Braces or Orthoses: Cervical Brace Cervical Brace: Hard collar Restrictions Weight Bearing Restrictions: No Other Position/Activity Restrictions: Pt with very weak core Pain:   Increased pain in neck/ shoulder. Repositioned, shoulder massage, alerted RN of recommendation for k-pad ADL: ADL ADL Comments: refer to functional navigator  See Function Navigator for Current Functional Status.   Therapy/Group: Individual Therapy  [name], Lysandra Loughmiller C 09/07/2016, 7:08 AM

## 2016-09-07 NOTE — Progress Notes (Signed)
Physical Therapy Session Note  Patient Details  Name: Patrick Romero MRN: [id number] Date of Birth: 05-11-1961  Today's Date: 09/07/2016 PT Individual Time: [id number] and 1500-1530 PT Individual Time Calculation (min): 90 min and 30 min (total 120 min)   Short Term Goals: Week 1:  PT Short Term Goal 1 (Week 1): Pt will perform bed mobility minA PT Short Term Goal 2 (Week 1): Pt will perform transfers consistent minA PT Short Term Goal 3 (Week 1): Pt will ambulate maxA +1 PT Short Term Goal 4 (Week 1): Pt will initiate stair training PT Short Term Goal 5 (Week 1): Pt will demonstrate OOB tolerance 3 hrs/day outside of therapy  Skilled Therapeutic Interventions/Progress Updates: Tx 1: Pt received supine in bed; c/o 4/10 pain in B shoulders and agreeable to treatment. Supine>sit with HOB elevated and modA for trunk control. Stand pivot transfer mod/maxA bed>w/c and w/c <>mat table x4 during session. Pressure mapping performed sitting on EOB; noted increased peak pressure >100 on right IT; used visual feedback on system ipad to initiate weight shifting min guard R/L, and eventually find neutral pelvic position with symmetrical weight bearing R vs L. Seated balance with wedge under R hip to shorten/facilitate R trunk obliques and neutralize pelvis. Gait maxA with w/c follow x40'; occasional scissoring RLE improved with visual target for abduction. Standing balance alternating toe taps forward/backward and on/off 3" step with mod/maxA with consistent LOB to R side. Returned to room totalA at end of session, stand pivot to bed modA. Pt requesting to use urinal; totalA to setup with urinal and pt noted to have voided in brief already plus 363mL in urinal. Donned new brief totalA; pt bridging with BLEs to A with brief placement. Remained supine in bed at end of session, all needs in reach.   Tx 2: Pt received seated in w/c; denies pain reporting he recently took pain medication, and agreeable to treatment.  Transported to gym [place]. Stand pivot transfer w/c >nustep modA. Performed BLE nustep x9 min on level 7 for BLE coordination and NMR. Gait x40' with mod/maxA, cues for RLE abduction to reduce scissoring gait pattern. Returned to room as above; remained seated in w/c with quick release belt intact for positioning purposes, soft call bell in reach.      Therapy Documentation Precautions:  Precautions Precautions: Cervical Required Braces or Orthoses: Cervical Brace Cervical Brace: Hard collar Restrictions Weight Bearing Restrictions: No Other Position/Activity Restrictions: Pt with very weak core   See Function Navigator for Current Functional Status.   Therapy/Group: Individual Therapy  Luberta Mutter 09/07/2016, 12:18 PM

## 2016-09-08 ENCOUNTER — Inpatient Hospital Stay (HOSPITAL_COMMUNITY): Payer: Self-pay | Admitting: Physical Therapy

## 2016-09-08 ENCOUNTER — Inpatient Hospital Stay (HOSPITAL_COMMUNITY): Payer: Self-pay | Admitting: Occupational Therapy

## 2016-09-08 ENCOUNTER — Inpatient Hospital Stay (HOSPITAL_COMMUNITY): Payer: Medicaid Other | Admitting: Occupational Therapy

## 2016-09-08 NOTE — Progress Notes (Signed)
Occupational Therapy Session Note  Patient Details  Name: Patrick Romero MRN: [id number] Date of Birth: 18-Jun-1961  Today's Date: 09/08/2016 OT Individual Time: [id number] OT Individual Time Calculation (min): 60 min    Short Term Goals: Week 1:  OT Short Term Goal 1 (Week 1): Pt will be able to sit to stand with mod A of 1 to prepare for toileting.  OT Short Term Goal 2 (Week 1): Pt will be able to transfer to toilet with mod A of 1. OT Short Term Goal 3 (Week 1): Pt will be able to use his LUE as a stabilizing A when donning a shirt. OT Short Term Goal 4 (Week 1): Pt will be able to maintain sitting balance with close S for at least 10 min to enable him to sit safely on a tub bench.  Skilled Therapeutic Interventions/Progress Updates:    Pt seen for OT session focusing on ADL re-training. Pt in supine upon arrival, agreeable to tx session. He transferred to EOB with assist to bring trunk upright, able to manage LEs off EOB. Seated EOB, pants donned with total A, pt attempting to use fists/ hands to pull pants upwards. He stood with mod A and steadying assist to have pants pulled up. Shirt don seated EOB, Pt working on extending UEs to thread into sleeves.  Stand pivot with mod A and VCs to w/c. Seated in high back supported w/c, worked on self feeding with use of dorsal wrist support universal cuff on L UE. Pt required steadying assist at wrist and elbow and guiding assist to target/ stab food, pt able to bring to mouth with steadying assist. Pt able to self feed ~60% of meal before voicing fatigue and requesting assist for remainder of task.  Grooming completed from w/c level at sink using cuff and total A set-up, pt able to manage UE independently to complete task. Hand over hand assist for washing face. Pt provided with bathmit for next session In therapy gym, pt completed stand pivot transfer to therapy mat and returned to supine on mat with supervision. Provided pt with heat pack on  shoulders. Pt left in supine with heating pad for hand off to next therapist.   Therapy Documentation Precautions:  Precautions Precautions: Cervical Required Braces or Orthoses: Cervical Brace Cervical Brace: Hard collar Restrictions Weight Bearing Restrictions: No Other Position/Activity Restrictions: Pt with very weak core Pain:   Voiced increased pain in neck, RN aware and pain medication administered.  ADL: ADL ADL Comments: refer to functional navigator  See Function Navigator for Current Functional Status.   Therapy/Group: Individual Therapy  [name], Season Astacio C 09/08/2016, 7:12 AM

## 2016-09-08 NOTE — IPOC Note (Signed)
Overall Plan of Care [facility]) Patient Details Name: Patrick Romero MRN: [id number] DOB: [birth year]  Admitting Diagnosis: fall with incomplete SCI  Hospital Problems: Active Problems:   Cervical myelopathy with cervical radiculopathy     Functional Problem List: Nursing Bladder, Bowel, Motor, Pain  PT Balance, Pain, Safety, Endurance, Sensory, Motor, Skin Integrity  OT Balance, Motor, Endurance, Pain, Sensory  SLP    TR Endurance, Motor, Pain, Safety, Skin Integrity       Basic ADL's: OT Eating, Grooming, Bathing, Dressing, Toileting     Advanced  ADL's: OT       Transfers: PT Bed Mobility, Bed to Chair, Car  OT Toilet, Tub/Shower     Locomotion: PT Wheelchair Mobility     Additional Impairments: OT Fuctional Use of Upper Extremity  SLP        TR      Anticipated Outcomes Item Anticipated Outcome  Self Feeding set up  Swallowing      Basic self-care  min A  Toileting  min A   Bathroom Transfers supervision  Bowel/Bladder  pt will be min assist with bowel an bladder  Transfers  S w/c level  Locomotion  modI w/c management, minA standing  Communication     Cognition     Pain  less than 3  Safety/Judgment  remain free from falls   Therapy Plan: PT Intensity: Minimum of 1-2 x/day ,45 to 90 minutes PT Frequency: 5 out of 7 days PT Duration Estimated Length of Stay: 4 weeks OT Intensity: Minimum of 1-2 x/day, 45 to 90 minutes OT Frequency: 5 out of 7 days OT Duration/Estimated Length of Stay: 28-30 days         Team Interventions: Nursing Interventions Patient/Family Education, Bladder Management, Bowel Management, Discharge Planning  PT interventions Ambulation/gait training, Balance/vestibular training, Community reintegration, Discharge planning, Disease management/prevention, DME/adaptive equipment instruction, Functional mobility training, Neuromuscular re-education, Patient/family education, Pain management, Psychosocial support, Skin  care/wound management, Stair training, Splinting/orthotics, Therapeutic Activities, Therapeutic Exercise, UE/LE Strength taining/ROM, UE/LE Coordination activities, Wheelchair propulsion/positioning  OT Interventions Training and development officer, Discharge planning, DME/adaptive equipment instruction, Functional mobility training, Neuromuscular re-education, Functional electrical stimulation, Pain management, Patient/family education, Self Care/advanced ADL retraining, Psychosocial support, Therapeutic Activities, Therapeutic Exercise, UE/LE Strength taining/ROM, UE/LE Coordination activities  SLP Interventions    TR Interventions Adaptive equipment instruction, 1:1 session, Training and development officer, Functional mobility training, Community reintegration, Group participation (Comment), Barrister's clerk education, Therapeutic activities, Recreation/leisure participation, Therapeutic exercise, UE/LE Coordination activities, Wheelchair propulsion/positioning  SW/CM Interventions Discharge Planning, Barrister's clerk, Patient/Family Education    Team Discharge Planning: Destination: PT-Home ,OT- Home , SLP-  Projected Follow-up: PT-Home health PT, OT-  Home health OT, SLP-  Projected Equipment Needs: PT-To be determined, OT- Tub/shower bench, 3 in 1 bedside comode, SLP-  Equipment Details: PT- , OT-  Patient/family involved in discharge planning: PT- Patient,  OT-Patient, SLP-   MD ELOS: 20-22d Medical Rehab Prognosis:  Good Assessment:  56 y.o.malewho was found unresponsive behind [place] on 08/31/16 with RUE weakness and lack of movement BLE and LUE as well as poor rectal tone. History taken from chart review and patient. ETOH level 324. MRI cervical spine reviewed, showing central cord abnormality C2-3 and C3-4. 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 He was evaluated  by Dr. Christella Noa who recommended cervical collar for support and Lyrica added to help manage dysesthesias   Now requiring 24/7 Rehab RN,MD,  as well as CIR level PT, OT and SLP.  Treatment team will focus on ADLs and mobility with goals set at [facility] See Team Conference Notes for weekly updates to the plan of care

## 2016-09-08 NOTE — Progress Notes (Addendum)
Subjective/Complaints: Working with OT needs assistance to Administrator. OT also needs to don his TED hose  Review of systems negative for chest pain, shortness breath, nausea, vomiting, diarrhea, nausea and constipation received Objective: Vital Signs: Blood pressure 111/67, pulse 83, temperature 98.9 F (37.2 C), temperature source Oral, resp. rate 18, weight 66.5 kg (146 lb 9.6 oz), SpO2 100 %. No results found. Results for orders placed or performed during the hospital encounter of 09/05/16 (from the past 72 hour(s))  CBC WITH DIFFERENTIAL     Status: Abnormal   Collection Time: 09/06/16  7:32 AM  Result Value Ref Range   WBC 7.8 4.0 - 10.5 K/uL   RBC 4.19 (L) 4.22 - 5.81 MIL/uL   Hemoglobin 10.9 (L) 13.0 - 17.0 g/dL   HCT 33.7 (L) 39.0 - 52.0 %   MCV 80.4 78.0 - 100.0 fL   MCH 26.0 26.0 - 34.0 pg   MCHC 32.3 30.0 - 36.0 g/dL   RDW 13.5 11.5 - 15.5 %   Platelets 288 150 - 400 K/uL   Neutrophils Relative % 56 %   Neutro Abs 4.4 1.7 - 7.7 K/uL   Lymphocytes Relative 29 %   Lymphs Abs 2.2 0.7 - 4.0 K/uL   Monocytes Relative 12 %   Monocytes Absolute 0.9 0.1 - 1.0 K/uL   Eosinophils Relative 3 %   Eosinophils Absolute 0.3 0.0 - 0.7 K/uL   Basophils Relative 0 %   Basophils Absolute 0.0 0.0 - 0.1 K/uL  Comprehensive metabolic panel     Status: Abnormal   Collection Time: 09/06/16  7:32 AM  Result Value Ref Range   Sodium 136 135 - 145 mmol/L   Potassium 4.6 3.5 - 5.1 mmol/L   Chloride 99 (L) 101 - 111 mmol/L   CO2 28 22 - 32 mmol/L   Glucose, Bld 94 65 - 99 mg/dL   BUN 12 6 - 20 mg/dL   Creatinine, Ser 1.02 0.61 - 1.24 mg/dL   Calcium 9.6 8.9 - 10.3 mg/dL   Total Protein 7.0 6.5 - 8.1 g/dL   Albumin 3.2 (L) 3.5 - 5.0 g/dL   AST 54 (H) 15 - 41 U/L   ALT 49 17 - 63 U/L   Alkaline Phosphatase 68 38 - 126 U/L   Total Bilirubin 0.7 0.3 - 1.2 mg/dL   GFR calc non Af Amer >60 >60 mL/min   GFR calc Af Amer >60 >60 mL/min    Comment: (NOTE) The eGFR has been calculated using  the CKD EPI equation. This calculation has not been validated in all clinical situations. eGFR's persistently <60 mL/min signify possible Chronic Kidney Disease.    Anion gap 9 5 - 15     HEENT: Poor dentition Cardio: RRR and No murmurs or extra sounds Resp: CTA B/L and unlabored GI: BS positive and Nontender, nondistended Extremity:  No Edema Skin:   Bruise Left hemifacial Neuro: Alert/Oriented, Abnormal Sensory Reduced light touch in bilateral hands. Intact in bilateral feet and legs, Abnormal Motor 3 minus at the left deltoid, biceps, 2- triceps,, grip 3 minus of the right deltoid muscle, biceps,2- triceps and3/5. at the ankle dorsiflexor, plantar flexor, 4 minus. Hip flexion, knee extension   Abnormal [facility] Ataxic/ dec [facility] 4 limbs.Musc/Skel:  Other No pain with upper extremity or lower extremity range of motion. Cervical orthosis. Pain with minimal neck movements Gen.: no acute distress   Assessment/Plan: 1. Functional deficits secondary to tetraplegia central cord syndrome which require 3+ hours per day  of interdisciplinary therapy in a comprehensive inpatient rehab setting. Physiatrist is providing close team supervision and 24 hour management of active medical problems listed below. Physiatrist and rehab team continue to assess barriers to discharge/monitor patient progress toward functional and medical goals. FIM: Function - Bathing Position: Wheelchair/chair at sink Body parts bathed by helper: Right arm, Left arm, Chest, Abdomen, Front perineal area, Buttocks, Right upper leg, Left upper leg, Right lower leg, Left lower leg, Back Assist Level: 2 helpers  Function- Upper Body Dressing/Undressing What is the patient wearing?: Pull over shirt/dress Pull over shirt/dress - Perfomed by helper: Thread/unthread right sleeve, Thread/unthread left sleeve, Put head through opening, Pull shirt over trunk Function - Lower Body Dressing/Undressing What is the patient wearing?: Pants, Ted  Hose, Non-skid slipper socks Position: Wheelchair/chair at sink Pants- Performed by helper: Thread/unthread right pants leg, Thread/unthread left pants leg, Pull pants up/down Non-skid slipper socks- Performed by helper: Don/doff right sock, Don/doff left sock TED Hose - Performed by helper: Don/doff right TED hose, Don/doff left TED hose Assist for lower body dressing: 2 Helpers  Function - Toileting Toileting steps completed by helper: Adjust clothing prior to toileting, Performs perineal hygiene, Adjust clothing after toileting Assist level: Two helpers  Function - Air cabin crew transfer assistive device: Elevated toilet seat/BSC over toilet Assist level to toilet: 2 helpers (stand pivot) Assist level from toilet: 2 helpers (stand pivot with mod-max A of 2)  Function - Chair/bed transfer Chair/bed transfer method: Stand pivot Chair/bed transfer assist level: Moderate assist (Pt 50 - 74%/lift or lower) Chair/bed transfer assistive device: Sliding board Chair/bed transfer details: Manual facilitation for weight shifting, Manual facilitation for placement, Tactile cues for sequencing, Tactile cues for weight shifting, Tactile cues for posture, Tactile cues for initiation, Verbal cues for sequencing, Verbal cues for technique  Function - Locomotion: Wheelchair Max wheelchair distance: 73f Assist Level: Maximal assistance (Pt 25 - 49%) Assist Level: Maximal assistance (Pt 25 - 49%) Function - Locomotion: Ambulation Assistive device: No device Max distance: 40 Assist level: Maximal assist (Pt 25 - 49%) Assist level: Maximal assist (Pt 25 - 49%)  Function - Comprehension Comprehension: Auditory Comprehension assist level: Follows complex conversation/direction with no assist  Function - Expression Expression: Verbal Expression assist level: Expresses complex ideas: With no assist  Function - Social Interaction Social Interaction assist level: Interacts appropriately  with others - No medications needed.  Function - Problem Solving Problem solving assist level: Solves basic 90% of the time/requires cueing < 10% of the time  Function - Memory Memory assist level: Complete Independence: No helper Patient normally able to recall (first 3 days only): That he or she is in a hospital, Current season Medical Problem List and Plan: 1.  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 Continue CIR level PT, OT 2.  DVT Prophylaxis/Anticoagulation: Pharmaceutical: Lovenox 3. Pain Management: Continue oxycodone prn for now. Will add ultram for moderate pain. Dysesthesias managed on current dose lyrica.  4. Mood: LCSW to follow for evaluation and support.  5. Neuropsych: This patient is capable of making decisions on his own behalf. 6. Skin/Wound Care:  Air mattress overlay. Pressure relief measures.  7. Fluids/Electrolytes/Nutrition: Monitor I/O.  BMET normal, hydration status looks okay, no need for IV fluids    Component Value Date/Time   NA 136 09/06/2016 0732   K 4.6 09/06/2016 0732   CL 99 (L) 09/06/2016 0732   CO2 28 09/06/2016 0732   GLUCOSE 94 09/06/2016 0732   BUN 12 09/06/2016 0732  CREATININE 1.02 09/06/2016 0732   CALCIUM 9.6 09/06/2016 0732   GFRNONAA >60 09/06/2016 0732   GFRAA >60 09/06/2016 0732   8. History of presyncope/syncope: Monitor for now.Unclear whether that the orthostatic changes are separate issue. Certainly this would be common after spinal cord injury,Patient has had no orthostatic drops since using the TED hose and abdominal binder 9. Anemia: question of chronic disease due to Alcohol use.Normocytic, hemoglobin mildly decreased, check stool. Guaiac  Anemia is not severe enough to be causing orthostatic changes. CBC    Component Value Date/Time   WBC 7.8 09/06/2016 0732   RBC 4.19 (L) 09/06/2016 0732   HGB 10.9 (L) 09/06/2016 0732   HCT 33.7 (L) 09/06/2016 0732   PLT 288 09/06/2016 0732   MCV 80.4 09/06/2016  0732   MCH 26.0 09/06/2016 0732   MCHC 32.3 09/06/2016 0732   RDW 13.5 09/06/2016 0732   LYMPHSABS 2.2 09/06/2016 0732   MONOABS 0.9 09/06/2016 0732   EOSABS 0.3 09/06/2016 0732   BASOSABS 0.0 09/06/2016 0732   10. Mid chest wall pain:  likely musculoskeletal in nature. Monitor for now.  11. Right renal cyst: Will need non-emergent follow up ultrasound for work up. 12. ETOH abuse/Tobacco: Counsel , if patient has cravings may consider NicoDerm patch  LOS (Days) 3 A FACE TO FACE EVALUATION WAS PERFORMED  Patrick Romero 09/08/2016, 10:40 AM

## 2016-09-08 NOTE — Progress Notes (Addendum)
Physical Therapy Session Note  Patient Details  Name: Patrick Romero MRN: [id number] Date of Birth: 1960-11-18  Today's Date: 09/08/2016 PT Individual Time: 1000-1100 PT Individual Time Calculation (min): 60 min   Short Term Goals: Week 1:  PT Short Term Goal 1 (Week 1): Pt will perform bed mobility minA PT Short Term Goal 2 (Week 1): Pt will perform transfers consistent minA PT Short Term Goal 3 (Week 1): Pt will ambulate maxA +1 PT Short Term Goal 4 (Week 1): Pt will initiate stair training PT Short Term Goal 5 (Week 1): Pt will demonstrate OOB tolerance 3 hrs/day outside of therapy  Skilled Therapeutic Interventions/Progress Updates: Pt received supine on mat table, c/o pain 6/10 in shoulders and back. Supine>sit modA for trunk control. Dynamic standing balance activities performing including side stepping R/L, static stance, static stance eyes closed, static stance on foam wedge, alternating ball kicks. All activities with variable min>maxA for balance due to R and posterolateral LOBs and poor ankle strategy. Gait x60' with maxA d/t scissoring RLE and ataxia. Therapist obtained lap tray for w/c for BUE support; educated pt on risk of sublux and pain due to shoulder weakness. Pt remained seated in w/c at end of session, soft call bell in reach.     Therapy Documentation Precautions:  Precautions Precautions: Cervical Required Braces or Orthoses: Cervical Brace Cervical Brace: Hard collar Restrictions Weight Bearing Restrictions: No Other Position/Activity Restrictions: Pt with very weak core   See Function Navigator for Current Functional Status.   Therapy/Group: Individual Therapy  Luberta Mutter 09/08/2016, 11:02 AM

## 2016-09-08 NOTE — Progress Notes (Signed)
Recreational Therapy Session Note  Patient Details  Name: ABDULAI MAPSON MRN: [id number] Date of Birth: 31-Mar-1961 Today's Date: 09/08/2016  Pain: c/o  Skilled Therapeutic Interventions/Progress Updates: Co-treat with PT with focus on activity tolerance, standing tolerance, weight shifting, directing his care.  Pt stood to kick a ball with LLE and then RLE.  Pt ambulated with PT, second person for safety although not needed.  Discussed w/c positioning with use of lap tray to support BUEs to assist with reducing neck & shoulder pain.  Pt stated some relief upon introduction of lap tray.  Therapy/Group: Co-Treatment   Ramy Greth 09/08/2016, 11:15 AM

## 2016-09-08 NOTE — Patient Care Conference (Signed)
Inpatient RehabilitationTeam Conference and Plan of Care Update Date: 09/06/2016   Time: 11:20 PM    Patient Name: Patrick Romero      Medical Record Number: [id number]  Date of Birth: [birth year] Sex: Male         Room/Bed: 4W10C/4W10C-01 Payor Info: Payor: MEDICAID POTENTIAL / Plan: MEDICAID POTENTIAL / Product Type: *No Product type* /    Admitting Diagnosis: fall with incomplete SCI  Admit Date/Time:  09/05/2016  4:02 PM Admission Comments: No comment available   Primary Diagnosis:  <principal problem not specified> Principal Problem: <principal problem not specified>  Patient Active Problem List   Diagnosis Date Noted  . Cervical myelopathy with cervical radiculopathy 09/05/2016  . Tetraparesis ([place])   . Central cord syndrome ([place])   . Anemia   . Renal cyst   . ETOH abuse   . Chest wall pain   . Neuropathic pain   . Neck pain   . Chronic neck and back pain 09/01/2016  . Alcohol intoxication ([place]) 09/01/2016  . Tobacco abuse 09/01/2016  . Paraplegia following spinal cord injury ([place]) 09/01/2016    Expected Discharge Date: Expected Discharge Date:  (3+ weeks)  Team Members Present: Physician leading conference: Dr. 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  Improve mobility, safety, anemia, pain  See above   Bowel/Bladder   LBM 08/31/16 Continent of urine/not retaining. bowel?  remain continent of bowel and bladder  monitor bowel and bladder   Swallow/Nutrition/ Hydration             ADL's   total A  min A self care, S transfers  UE AROM, estim, trunk control, pt education   Mobility   modA bed mobility,  maxA transfers, min>maxA dynamic sitting balance, +2 gait x30'   S w/c level, minA gait in controlled environment,   BLE NMR, sitting balance, bed mobility and transfer training, gait training   Communication             Safety/Cognition/ Behavioral Observations  no unsafe behavior  min assist  monitor q shift   Pain   pain to neck-oxy IR 5-10mg  q4 hrs, Ultram 50mg  q6 hrs, Flexeril  3 times daily prn/kpad to bilateral shoulders  keep pain less than or equal to 3  assess pain q shift   Skin   no skin breakdown   no skin breakdown this admission/turn q2 hours  assess skin q shift    Rehab Goals Patient on target to meet rehab goals: Yes *See Care Plan and progress notes for long and short-term goals.  Barriers to Discharge: Mobility, safety, feeding, anemia, etoh abuse, pain    Possible Resolutions to Barriers:  Therapies, education, adaptive equip, optimize pain meds    Discharge Planning/Teaching Needs:  Plan to d/c either home with girlfriend or with mother and they will provide 24/7 assistance (Per Karene Fry, [facility])  Teaching to be scheduled.   Team Discussion:  New pt with most evals underway today.  Therapists are anticipating supervision to min assist goals and 3-4 week LOS  Revisions to Treatment Plan:  None   Continued Need for Acute Rehabilitation Level of Care: The patient requires  daily medical management by a physician with specialized training in physical medicine and rehabilitation for the following conditions: Daily direction of a multidisciplinary physical rehabilitation program to ensure safe treatment while eliciting the highest outcome that is of practical value to the patient.: Yes Daily medical management of patient stability for increased activity during participation in an intensive rehabilitation regime.: Yes Daily analysis of laboratory values and/or radiology reports with any subsequent need for medication adjustment of medical intervention for : Neurological  problems;Other  Kamaron Deskins 09/08/2016, 11:39 AM

## 2016-09-08 NOTE — Progress Notes (Signed)
Occupational Therapy Session Note  Patient Details  Name: Patrick Romero MRN: [id number] Date of Birth: 03/09/1961  Today's Date: 09/08/2016 OT Individual Time: 0930-1000 OT Individual Time Calculation (min): 30 min    Short Term Goals: Week 1:  OT Short Term Goal 1 (Week 1): Pt will be able to sit to stand with mod A of 1 to prepare for toileting.  OT Short Term Goal 2 (Week 1): Pt will be able to transfer to toilet with mod A of 1. OT Short Term Goal 3 (Week 1): Pt will be able to use his LUE as a stabilizing A when donning a shirt. OT Short Term Goal 4 (Week 1): Pt will be able to maintain sitting balance with close S for at least 10 min to enable him to sit safely on a tub bench.  Skilled Therapeutic Interventions/Progress Updates:    Pt received supine on mat in hand off from previous OT session. 15 min spent on RUE , then 15 on LUE with A/AROM. Pt has gained a great deal of strength since his evaluation 2 days ago. He now has 75% of L finger extension, 80% of finger flexion, L 3+/5 elbow flexion, improved L shoulder flexion AROM.  He also has increased AROM in RUE with movement in biceps, forearm.  Pt tolerated all exercises well and is highly motivated.  Pt 's next therapist arrived for next session.   Therapy Documentation Precautions:  Precautions Precautions: Cervical Required Braces or Orthoses: Cervical Brace Cervical Brace: Hard collar Restrictions Weight Bearing Restrictions: No Other Position/Activity Restrictions: Pt with very weak core   Pain: c/o pain in back of neck. Pt resting on heat pad during session.   ADL: ADL ADL Comments: refer to functional navigator  See Function Navigator for Current Functional Status.   Therapy/Group: Individual Therapy  [place] 09/08/2016, 12:34 PM

## 2016-09-09 ENCOUNTER — Inpatient Hospital Stay (HOSPITAL_COMMUNITY): Payer: Self-pay | Admitting: Occupational Therapy

## 2016-09-09 ENCOUNTER — Inpatient Hospital Stay (HOSPITAL_COMMUNITY): Payer: Self-pay | Admitting: Physical Therapy

## 2016-09-09 ENCOUNTER — Inpatient Hospital Stay (HOSPITAL_COMMUNITY): Payer: Medicaid Other | Admitting: Occupational Therapy

## 2016-09-09 ENCOUNTER — Inpatient Hospital Stay (HOSPITAL_COMMUNITY): Payer: Medicaid Other

## 2016-09-09 NOTE — Progress Notes (Signed)
Social Work Patient ID: Patrick Romero, male   DOB: 1961-05-06, 56 y.o.   MRN: [id number]   Have reviewed team conference with pt who is aware and agreeable with targeted LOS of 3+ weeks.  He reports he is "motivated to get as good as I can" but aware that goals are being set for w/c level.  Very pleasant and talkatative. Will follow for support and d/c planning needs.  Charron Coultas, LCSW

## 2016-09-09 NOTE — Progress Notes (Signed)
Physical Therapy Session Note  Patient Details  Name: Patrick Romero MRN: [id number] Date of Birth: [birth year]  Today's Date: 09/08/2016 PT Individual Time:  4097-3532  PT Individual Time Calculation: 90 min   Short Term Goals: Week 1:  PT Short Term Goal 1 (Week 1): Pt will perform bed mobility minA PT Short Term Goal 2 (Week 1): Pt will perform transfers consistent minA PT Short Term Goal 3 (Week 1): Pt will ambulate maxA +1 PT Short Term Goal 4 (Week 1): Pt will initiate stair training PT Short Term Goal 5 (Week 1): Pt will demonstrate OOB tolerance 3 hrs/day outside of therapy  Skilled Therapeutic Interventions/Progress Updates:   Pt received supine in bed and agreeable to PT. Supine>sit transfer with min-mod assist at trunk as well as on the RLE  and min cues for posture and reciprocal scooting to EOB.   Stand pivot transfer training completed x 4 throughout treatment with max assist from PT pt prevent lateral LOB as well as facilitate weight shifting L and to improve gait pattern. Sit<>stand transfer training x 15 throughout treatment with mod assist progressing to max assist at end of treatment due to fatigue in the RLE and constant lateral LOB.   PT instructed patient in standing balance training to perform reciprocal tapping on 3 inch step x 10 BLE with light LUE support. Patient demonstrated difficulty with hip.knee flexion on the R LE and attempted to compensation through trunkal extension. Additional standing balance performed for lateral weight shift2 2x 10  and BLE. Attempts lateral stepping to target, but unable to perform due to fatigue in the RLE and increased knee instability.   Seated UE and LE therex:  Elbow flexion on BLE x 12 with AARom on the RUE, low row/chest press on BUE with manual resistance x 12 BLE, Isometric shoulder abduction BUE. Hip flexion x 12 BLE, LAQ x 10 BLE. Hip abduction. PT provided cues on proper ROM and speed to improve strengthening aspects of  movement.   Nustep reciprocal movement training x 10 minutes at level 2 BLE only. Min cues for proper steps per min>40 and decreased force of movement to demonstrate increased control.    Pt returned to room and performed stand pivot transfer to bed with max assist. Sit>supine completed with min assist and left supine in bed with call bell in reach and all needs met.         Therapy Documentation Precautions:  Precautions Precautions: Cervical Required Braces or Orthoses: Cervical Brace Cervical Brace: Hard collar Restrictions Weight Bearing Restrictions: No Other Position/Activity Restrictions: Pt with very weak core Vital Signs: Therapy Vitals Temp: 98.6 F (37 C) Temp Source: Oral Pulse Rate: 85 Resp: 18 BP: 111/73 Patient Position (if appropriate): Lying Oxygen Therapy SpO2: 98 % O2 Device: Not Delivered   See Function Navigator for Current Functional Status.   Therapy/Group: Individual Therapy  Lorie Phenix 09/09/2016, 5:31 AM

## 2016-09-09 NOTE — Progress Notes (Signed)
Occupational Therapy Session Note  Patient Details  Name: Patrick Romero MRN: [id number] Date of Birth: [birth year]  Today's Date: 09/09/2016 OT Individual Time: 0800-0900 OT Individual Time Calculation (min): 60 min    Short Term Goals: Week 1:  OT Short Term Goal 1 (Week 1): Pt will be able to sit to stand with mod A of 1 to prepare for toileting.  OT Short Term Goal 2 (Week 1): Pt will be able to transfer to toilet with mod A of 1. OT Short Term Goal 3 (Week 1): Pt will be able to use his LUE as a stabilizing A when donning a shirt. OT Short Term Goal 4 (Week 1): Pt will be able to maintain sitting balance with close S for at least 10 min to enable him to sit safely on a tub bench.  Skilled Therapeutic Interventions/Progress Updates: ADL-retraining with focus on improved skill with AE use (left hand orthosis, plate-guard), transfers, and static standing balance.   Pt received supine in bed with RN tech assisting with urinal.   Pt was unable to void urine. OT facilitated bed mobility and self-feeding using AE provided with hand guidance to improve thoroughness.   OT demo'd use of plate guard to reduce spillage.   Abdominal binder applied while pt sat at EOB.   Pt completed transfer with min vc for technique and steadying assist.   Pt groomed at sink with setup to brush his teeth.   Pt reported need to urinate but voided urine prior to placement of urinal.   OT replaced brief and provided clean shorts; pt able to stand at sink while being assisted with lower body dressing.   Pt recovered to bed at end of session with min assist to lift right leg.  Therapy Documentation Precautions:  Precautions Precautions: Cervical Required Braces or Orthoses: Cervical Brace Cervical Brace: Hard collar Restrictions Weight Bearing Restrictions: No Other Position/Activity Restrictions: Pt with very weak core Pain: Pain Assessment Pain Assessment: No/denies pain Pain Score: 0-No pain ADL: ADL ADL  Comments: refer to functional navigator  See Function Navigator for Current Functional Status.   Therapy/Group: Individual Therapy  Tatianna Ibbotson 09/09/2016, 12:17 PM

## 2016-09-09 NOTE — Progress Notes (Signed)
Physical Therapy Session Note  Patient Details  Name: Patrick Romero MRN: [id number] Date of Birth: 08-Aug-1960  Today's Date: 09/09/2016 PT Individual Time: 1100-1200 and 1500-1530 PT Individual Time Calculation (min): 60 min and 30 min (total 90 min)   Short Term Goals: Week 1:  PT Short Term Goal 1 (Week 1): Pt will perform bed mobility minA PT Short Term Goal 2 (Week 1): Pt will perform transfers consistent minA PT Short Term Goal 3 (Week 1): Pt will ambulate maxA +1 PT Short Term Goal 4 (Week 1): Pt will initiate stair training PT Short Term Goal 5 (Week 1): Pt will demonstrate OOB tolerance 3 hrs/day outside of therapy  Skilled Therapeutic Interventions/Progress Updates: Tx 1: Pt received seated in w/c with handoff from OT; c/o pain in neck 5/10 with increased activity throughout session. Stand pivot transfer w/c <>mat mod/maxA. Standing balance with toe taps to targets, static standing on wedge for ankle strategy, weight shifting, RLE coordination; mod/maxA overall for balance with periods of min guard. Increased postural sway in static stand with consistent LOB posteriorly. Standing balance with LUE reaching for horseshoe; unable in standing. Donned hand splint to LUE, and able to perform reaching to grab horseshoe and throw to target in sitting with 75% success. Sit <>supine modA. Remained supine on mat with hot pack under shoulders; palpable trigger points in B rhomboids/middle traps. Performed LE strengthening exercises in supine 3x10 reps each including straight leg raise, hip abduction, bridging with hip adduction isometric. Gait x15' maxA with RUE over therapist. Performed nustep x8 min BLE for strengthening and coordination. Returned to room totalA in w/c at end of session; remained seated in w/c with lap tray in place and soft call bell in reach.   Tx 2: Pt received semi-reclined on mat table after OT session; reports pain improving with use of heat pad but does not rate. Ambulatory  transfers during session with mod/maxA. Nustep x10 min with BLE, LUE with hand splint for NMR and coordination. Gait x60' and x150' with platform RW and modA overall; tactile cues for R glute/glute med activation, visual/tactile cues for aiming RLE toward R walker wheel. Returned to bed at end of session with minA sit >supine. Remained supine in bed at end of session, all needs in reach.     Therapy Documentation Precautions:  Precautions Precautions: Cervical Required Braces or Orthoses: Cervical Brace Cervical Brace: Hard collar Restrictions Weight Bearing Restrictions: No Other Position/Activity Restrictions: Pt with very weak core   See Function Navigator for Current Functional Status.   Therapy/Group: Individual Therapy  Luberta Mutter 09/09/2016, 12:05 PM

## 2016-09-09 NOTE — Progress Notes (Signed)
Occupational Therapy Session Note  Patient Details  Name: Patrick Romero MRN: [id number] Date of Birth: 1961/02/06  Today's Date: 09/09/2016 OT Individual Time: 1015-1100 and 1400-1500 OT Individual Time Calculation (min): 45 min  And 60 min   Short Term Goals: Week 1:  OT Short Term Goal 1 (Week 1): Pt will be able to sit to stand with mod A of 1 to prepare for toileting.  OT Short Term Goal 2 (Week 1): Pt will be able to transfer to toilet with mod A of 1. OT Short Term Goal 3 (Week 1): Pt will be able to use his LUE as a stabilizing A when donning a shirt. OT Short Term Goal 4 (Week 1): Pt will be able to maintain sitting balance with close S for at least 10 min to enable him to sit safely on a tub bench.  Skilled Therapeutic Interventions/Progress Updates:    Session One: Pt seen for OT session focusing on UE ROM. Pt in supine upon arrival, agreeable to tx session. He transferred to EOB with min A for UB, able to advance LEs off EOB independently. Max A stand pivot transfers completed throughout session with pt able to complete sit> stand with mod A, however, requiring increased assist for pivoting portion and controlled descent due to decreased postural control and R LE ataxia.  Utilzied Swedish arm support during session completing shoulder protraction/ retraction on B UEs, completed one UE at a time, progressing to using B UEs simultaneiously. Completed x2 trials on each UE, first trial completed in supported sitting position, second trial seated unsupported with increased ROM allowed during supported seating position. Pt noted with increased ROM L>R. He voiced increased comfort in neck/ shoulders following ROM.  Pt returned to gym at end of session with hand off to PT.  Session Two: Pt seen for OT session focusing on ADL re-training/ positioning, and generalized strengthening. Pt in supine upon arrival voicing feeling desire to urinate, however, unable to void using hand held urina  having been attempting for over an hour per pt report. Pt transferred to EOB min A. He stood from EOB with min A to stand and max A standing balance, therapist managed urinal and pt able to void 462ml from standing position. RN made aware of pt's difficulties voiding as well as recommendation for change of positioning for voiding. He self propelled w/c to therapy gym using B LEs, required repositioning throughout as pt sliding out of chair when propelling.  Completed max A squat pivot transfer to therapy mat, pt able to assist following VCs for technique.  Seated EOM, completed core strengthening activity reclining back on wedge and required to come into upright position. Completed x10 reps. ACE wraps placed around pt's hand to facilitate grasp to #1 dowel rod, worked on active assist shoulder press and chest press, x10 each.  Pt then reclined on wedges with heat pack applied to shoulders for comfort. From this position, worked on B UE ROM in gravity eliminated and against gravity positions. Pt with R bicep flickering in gravity eliminated position, no noted tricep. L bicep/ tricep against gravity (not against resistance) and weak wrist flexion/ extension and fatigues easily. Pt left reclined on EOM with heat pack in place for hand off to PT.   Therapy Documentation Precautions:  Precautions Precautions: Cervical Required Braces or Orthoses: Cervical Brace Cervical Brace: Hard collar Restrictions Weight Bearing Restrictions: No Other Position/Activity Restrictions: Pt with very weak core Pain: Pain Assessment Pain Assessment: No/denies pain Pain Score:  0-No pain ADL: ADL ADL Comments: refer to functional navigator  See Function Navigator for Current Functional Status.   Therapy/Group: Individual Therapy  [name], Lestine Rahe C 09/09/2016, 7:06 AM

## 2016-09-09 NOTE — Progress Notes (Signed)
Subjective/Complaints: No known issues overnight. Working with OT  Review of systems negative for chest pain, shortness breath, nausea, vomiting, diarrhea, nausea and constipation received Objective: Vital Signs: Blood pressure 111/73, pulse 85, temperature 98.6 F (37 C), temperature source Oral, resp. rate 18, weight 66.5 kg (146 lb 9.6 oz), SpO2 98 %. No results found. No results found for this or any previous visit (from the past 72 hour(s)).   HEENT: Poor dentition Cardio: RRR and No murmurs or extra sounds Resp: CTA B/L and unlabored GI: BS positive and Nontender, nondistended Extremity:  No Edema Skin:   Bruise Left hemifacial Neuro: Alert/Oriented, Abnormal Sensory Reduced light touch in bilateral hands. Intact in bilateral feet and legs, Abnormal Motor 3 minus at the left deltoid, biceps, 2- triceps,, grip 3 minus of the right deltoid muscle, biceps,2- triceps and3/5. at the ankle dorsiflexor, plantar flexor, 4 minus. Hip flexion, knee extension   Abnormal [facility] Ataxic/ dec [facility] 4 limbs.Musc/Skel:  Other No pain with upper extremity or lower extremity range of motion. Cervical orthosis. Pain with minimal neck movements Gen.: no acute distress   Assessment/Plan: 1. 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 Physiatrist is providing close team supervision and 24 hour management of active medical problems listed below. Physiatrist and rehab team continue to assess barriers to discharge/monitor patient progress toward functional and medical goals. FIM: Function - Bathing Position: Bed (Total A night bath by nursing) Body parts bathed by helper: Right arm, Left arm, Chest, Abdomen, Front perineal area, Buttocks, Right upper leg, Left upper leg, Right lower leg, Left lower leg, Back Assist Level: 2 helpers  Function- Upper Body Dressing/Undressing What is  the patient wearing?: Pull over shirt/dress Pull over shirt/dress - Perfomed by helper: Thread/unthread right sleeve, Thread/unthread left sleeve, Put head through opening, Pull shirt over trunk Function - Lower Body Dressing/Undressing What is the patient wearing?: Pants Position: Standing at sink Pants- Performed by helper: Thread/unthread right pants leg, Thread/unthread left pants leg, Pull pants up/down Non-skid slipper socks- Performed by helper: Don/doff right sock, Don/doff left sock TED Hose - Performed by helper: Don/doff right TED hose, Don/doff left TED hose Assist for footwear: Dependant Assist for lower body dressing: 2 Helpers  Function - Toileting Toileting steps completed by patient: Performs perineal hygiene, Adjust clothing prior to toileting, Adjust clothing after toileting Toileting steps completed by helper: Adjust clothing prior to toileting, Performs perineal hygiene, Adjust clothing after toileting Toileting Assistive Devices: Other (comment) (Urinal) Assist level:  (Max assist)  Function - Air cabin crew transfer assistive device: Elevated toilet seat/BSC over toilet Assist level to toilet: 2 helpers Assist level from toilet: 2 helpers  Function - Chair/bed transfer Chair/bed transfer method: Stand pivot Chair/bed transfer assist level: Touching or steadying assistance (Pt > 75%) Chair/bed transfer assistive device: Bedrails, Armrests Chair/bed transfer details: Verbal cues for technique, Manual facilitation for weight shifting  Function - Locomotion: Wheelchair Max wheelchair distance: 17ft Assist Level: Maximal assistance (Pt 25 - 49%) Assist Level: Maximal assistance (Pt 25 - 49%) Function - Locomotion: Ambulation Assistive device: No device Max distance: 60 Assist level: Maximal assist (Pt 25 - 49%) Assist level: Maximal assist (Pt 25 - 49%) Assist level: Maximal assist (Pt 25 - 49%)  Function - Comprehension Comprehension:  Auditory Comprehension assist level: Follows complex conversation/direction with no assist  Function - Expression Expression: Verbal Expression assist level: Expresses complex ideas: With no assist  Function - Social Interaction Social Interaction assist level: Interacts appropriately with others - No medications needed.  Function - Problem Solving Problem solving assist level: Solves basic 90% of the time/requires cueing < 10% of the time  Function - Memory Memory assist level: Complete Independence: No helper Patient normally able to recall (first 3 days only): That he or she is in a hospital, Current season, Staff names and faces Medical Problem List and Plan: 1.  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 Continue CIR level PT, OT 2.  DVT Prophylaxis/Anticoagulation: Pharmaceutical: Lovenox 3. Pain Management: Continue oxycodone prn for now. Will add ultram for moderate pain. Dysesthesias managed on current dose lyrica.  4. Mood: LCSW to follow for evaluation and support.  5. Neuropsych: This patient is capable of making decisions on his own behalf. 6. Skin/Wound Care:  Air mattress overlay. Pressure relief measures.  7. Fluids/Electrolytes/Nutrition: Monitor I/O.  BMET normal, hydration status looks okay, no need for IV fluids    Component Value Date/Time   NA 136 09/06/2016 0732   K 4.6 09/06/2016 0732   CL 99 (L) 09/06/2016 0732   CO2 28 09/06/2016 0732   GLUCOSE 94 09/06/2016 0732   BUN 12 09/06/2016 0732   CREATININE 1.02 09/06/2016 0732   CALCIUM 9.6 09/06/2016 0732   GFRNONAA >60 09/06/2016 0732   GFRAA >60 09/06/2016 0732   8. History of presyncope/syncope: Monitor for now.Unclear whether that the orthostatic changes are separate issue. Certainly this would be common after spinal cord injury,Patient has had no orthostatic drops since using the TED hose and abdominal binder 9. Anemia: question of chronic disease due to Alcohol use.Normocytic,  hemoglobin mildly decreased, check stool. Guaiac  Anemia is not severe enough to be causing orthostatic changes. CBC    Component Value Date/Time   WBC 7.8 09/06/2016 0732   RBC 4.19 (L) 09/06/2016 0732   HGB 10.9 (L) 09/06/2016 0732   HCT 33.7 (L) 09/06/2016 0732   PLT 288 09/06/2016 0732   MCV 80.4 09/06/2016 0732   MCH 26.0 09/06/2016 0732   MCHC 32.3 09/06/2016 0732   RDW 13.5 09/06/2016 0732   LYMPHSABS 2.2 09/06/2016 0732   MONOABS 0.9 09/06/2016 0732   EOSABS 0.3 09/06/2016 0732   BASOSABS 0.0 09/06/2016 0732   10. Mid chest wall pain:  likely musculoskeletal in nature. Monitor for now.  11. Right renal cyst: Will need non-emergent follow up ultrasound for work up. 12. ETOH abuse/Tobacco: Counsel , if patient has cravings may consider NicoDerm patch  LOS (Days) 4 A FACE TO FACE EVALUATION WAS PERFORMED  Nickolus Wadding E 09/09/2016, 1:42 PM

## 2016-09-10 ENCOUNTER — Inpatient Hospital Stay (HOSPITAL_COMMUNITY): Payer: Medicaid Other

## 2016-09-10 DIAGNOSIS — R52 Pain, unspecified: Secondary | ICD-10-CM

## 2016-09-10 DIAGNOSIS — Z87898 Personal history of other specified conditions: Secondary | ICD-10-CM

## 2016-09-10 NOTE — Progress Notes (Signed)
Occupational Therapy Session Note  Patient Details  Name: Patrick Romero MRN: [id number] Date of Birth: 07/11/1961  Today's Date: 09/10/2016 OT Individual Time: 0915-1000 OT Individual Time Calculation (min): 45 min    Short Term Goals: Week 1:  OT Short Term Goal 1 (Week 1): Pt will be able to sit to stand with mod A of 1 to prepare for toileting.  OT Short Term Goal 2 (Week 1): Pt will be able to transfer to toilet with mod A of 1. OT Short Term Goal 3 (Week 1): Pt will be able to use his LUE as a stabilizing A when donning a shirt. OT Short Term Goal 4 (Week 1): Pt will be able to maintain sitting balance with close S for at least 10 min to enable him to sit safely on a tub bench.  Skilled Therapeutic Interventions/Progress Updates: ADL-retraining with focus on NMR of RUE, self-feeding ,AE re-ed, grooming.  Pt received supine in bed and requesting assist with self-feeding.   OT re-assessed RUE function and noted thumb flexion, elbow flex/ext, shoulder int/ext rotation present, and scapular elevation/depression at overall MS of 2/5.  Trace finger flexion noted now as well.   Pt was then assisted to sit at EOB and setup for self-feeding using left hand and hand orthosis with utensil holder.   Pt required only assist to scoop food each time for improved accuracy but he was able to bring utensil to his mouth unassisted for each bite.   Pt completed self-feeding to his satisfaction and requested assist with shaving his chin and with replacing collar pad for comfort.   Pt was assisted to supine to maintain cervical alignment as OT completed shaving for pt (100%).     Therapy Documentation Precautions:  Precautions Precautions: Cervical Required Braces or Orthoses: Cervical Brace Cervical Brace: Hard collar Restrictions Weight Bearing Restrictions: No Other Position/Activity Restrictions: Pt with very weak core   Pain: No/denies pain   ADL: ADL ADL Comments: refer to functional  navigator   See Function Navigator for Current Functional Status.   Therapy/Group: Individual Therapy  [place] 09/10/2016, 12:26 PM

## 2016-09-10 NOTE — Care Management Note (Signed)
[facility] Individual Statement of Services  Patient Name:  Patrick Romero  Date:  09/08/2016  Welcome to the [facility].  Our goal is to provide you with an individualized program based on your diagnosis and situation, designed to meet your specific needs.  With this comprehensive rehabilitation program, you will be expected to participate in at least 3 hours of rehabilitation therapies Monday-Friday, with modified therapy programming on the weekends.  Your rehabilitation program will include the following services:  Physical Therapy (PT), Occupational Therapy (OT), 24 hour per day rehabilitation nursing, Therapeutic Recreaction (TR), Neuropsychology, Case Management (Social Worker), Rehabilitation Medicine, Nutrition Services and Pharmacy Services  Weekly team conferences will be held on Tuesdays to discuss your progress.  Your Social Worker will talk with you frequently to get your input and to update you on team discussions.  Team conferences with you and your family in attendance may also be held.  Expected length of stay: 28 days  Overall anticipated outcome: minimal assistance  Depending on your progress and recovery, your program may change. Your Social Worker will coordinate services and will keep you informed of any changes. Your Social Worker's name and contact numbers are listed  below.  The following services may also be recommended but are not provided by the [facility] will be made to provide these services after discharge if needed.  Arrangements include referral to agencies that provide these services.  Your insurance has been verified to be:  Medicaid and SSD applications currently pending Your primary doctor is:  None (social worker will assist you with referral to  [employer])  Pertinent information will be shared with your doctor and your insurance company.  Social Worker:  [place], [place] or ([phone]   Information discussed with and copy given to patient by: Lennart Pall, 09/08/2016, 5:07 PM

## 2016-09-10 NOTE — Progress Notes (Addendum)
Subjective/Complaints: Pt seen laying in bed this AM.  He slept well overnight.  He states his abdominal  [place] has been on since therapies yesterday.   Review of systems: Denies chest pain, shortness breath, nausea, vomiting, diarrhea, nausea and constipation  Objective: Vital Signs: Blood pressure 110/78, pulse 78, temperature 98.6 F (37 C), temperature source Oral, resp. rate 18, weight 66.5 kg (146 lb 9.6 oz), SpO2 98 %. No results found. No results found for this or any previous visit (from the past 72 hour(s)).   HEENT: Poor dentition. Normocephalic. Atraumatic.  Cardio: RRR. No JVD. Neck: C-collar in place.  Resp: CTA B/L and unlabored GI: BS positive and Nontender Skin:   Intact. Warm and dry.  Neuro: Alert/Oriented Motor 3-5 left deltoid, biceps, 2- triceps, wrist extension 0/5, hand grip 1+/5  3-/5 right deltoid muscle, biceps,2- triceps wrist extension, hand grip 0/5 3/5 at the ankle dorsiflexor, plantar flexor, 4 minus. Hip flexion, knee extension  Musc: No edema. No tenderness in extremities.  Gen.: no acute distress. Vital signs reviewed.    Assessment/Plan: 1. 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 Physiatrist is providing close team supervision and 24 hour management of active medical problems listed below. Physiatrist and rehab team continue to assess barriers to discharge/monitor patient progress toward functional and medical goals. FIM: Function - Bathing Position: Bed (Total A night bath by nursing) Body parts bathed by helper: Right arm, Left arm, Chest, Abdomen, Front perineal area, Buttocks, Right upper leg, Left upper leg, Right lower leg, Left lower leg, Back Assist Level: 2 helpers  Function- Upper Body Dressing/Undressing What is the patient wearing?: Pull over shirt/dress Pull over shirt/dress - Perfomed by helper:  Thread/unthread right sleeve, Thread/unthread left sleeve, Put head through opening, Pull shirt over trunk Function - Lower Body Dressing/Undressing What is the patient wearing?: Pants Position: Standing at sink Pants- Performed by helper: Thread/unthread right pants leg, Thread/unthread left pants leg, Pull pants up/down Non-skid slipper socks- Performed by helper: Don/doff right sock, Don/doff left sock TED Hose - Performed by helper: Don/doff right TED hose, Don/doff left TED hose Assist for footwear: Dependant Assist for lower body dressing: 2 Helpers  Function - Toileting Toileting steps completed by patient: Performs perineal hygiene, Adjust clothing prior to toileting, Adjust clothing after toileting Toileting steps completed by helper: Adjust clothing prior to toileting, Performs perineal hygiene, Adjust clothing after toileting Toileting Assistive Devices: Other (comment) (urinal) Assist level:  (Max assist)  Function - Air cabin crew transfer assistive device: Elevated toilet seat/BSC over toilet Assist level to toilet: Touching or steadying assistance (Pt > 75%) Assist level from toilet: Touching or steadying assistance (Pt > 75%)  Function - Chair/bed transfer Chair/bed transfer method: Stand pivot Chair/bed transfer assist level: Touching or steadying assistance (Pt > 75%) Chair/bed transfer assistive device: Bedrails, Armrests Chair/bed transfer details: Verbal cues for technique, Manual facilitation for weight shifting  Function - Locomotion: Wheelchair Max wheelchair distance: 20ft Assist Level: Maximal assistance (Pt 25 - 49%) Assist Level: Maximal assistance (Pt 25 - 49%) Function - Locomotion: Ambulation Assistive device: Other (comment) (platform RW) Max distance: 150 Assist level: Moderate assist (Pt 50 - 74%) Assist level: Moderate assist (Pt 50 - 74%) Assist level: Moderate assist (Pt 50 - 74%) Assist level: Moderate assist (Pt 50 -  74%)  Function - Comprehension Comprehension: Auditory Comprehension assist level: Follows complex conversation/direction with no assist  Function -  Expression Expression: Verbal Expression assist level: Expresses complex ideas: With no assist  Function - Social Interaction Social Interaction assist level: Interacts appropriately with others - No medications needed.  Function - Problem Solving Problem solving assist level: Solves basic 90% of the time/requires cueing < 10% of the time  Function - Memory Memory assist level: Complete Independence: No helper Patient normally able to recall (first 3 days only): That he or she is in a hospital, Current season, Staff names and faces Medical Problem List and Plan: 1.  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  Continue CIR  Educated nursing on use of abd binder 2.  DVT Prophylaxis/Anticoagulation: Pharmaceutical: Lovenox 3. Pain Management: Continue oxycodone prn for now.  Added ultram for moderate pain. Dysesthesias managed on current dose lyrica.  4. Mood: LCSW to follow for evaluation and support.  5. Neuropsych: This patient is capable of making decisions on his own behalf. 6. Skin/Wound Care:  Air mattress overlay. Pressure relief measures.  7. Fluids/Electrolytes/Nutrition: Monitor I/O.      Component Value Date/Time   NA 136 09/06/2016 0732   K 4.6 09/06/2016 0732   CL 99 (L) 09/06/2016 0732   CO2 28 09/06/2016 0732   GLUCOSE 94 09/06/2016 0732   BUN 12 09/06/2016 0732   CREATININE 1.02 09/06/2016 0732   CALCIUM 9.6 09/06/2016 0732   GFRNONAA >60 09/06/2016 0732   GFRAA >60 09/06/2016 0732   8. History of presyncope/syncope: Monitor for now.Unclear whether that the orthostatic changes are separate issue. Certainly this would be common after spinal cord injury,Patient has had no orthostatic drops since using the TED hose and abdominal binder 9. Anemia: question of chronic disease due to Alcohol  use.Normocytic, hemoglobin mildly decreased. CBC    Component Value Date/Time   WBC 7.8 09/06/2016 0732   RBC 4.19 (L) 09/06/2016 0732   HGB 10.9 (L) 09/06/2016 0732   HCT 33.7 (L) 09/06/2016 0732   PLT 288 09/06/2016 0732   MCV 80.4 09/06/2016 0732   MCH 26.0 09/06/2016 0732   MCHC 32.3 09/06/2016 0732   RDW 13.5 09/06/2016 0732   LYMPHSABS 2.2 09/06/2016 0732   MONOABS 0.9 09/06/2016 0732   EOSABS 0.3 09/06/2016 0732   BASOSABS 0.0 09/06/2016 0732   10. Mid chest wall pain:  Resolved  likely musculoskeletal in nature. Monitor for now.  11. Right renal cyst: Will need non-emergent follow up ultrasound for work up. 12. ETOH abuse/Tobacco: Counsel , if patient has cravings may consider NicoDerm patch  LOS (Days) 5 A FACE TO FACE EVALUATION WAS PERFORMED  Patrick Romero 09/10/2016, 8:00 PM

## 2016-09-10 NOTE — Progress Notes (Signed)
Social Work  Social Work Assessment and Plan  Patient Details  Name: Patrick Romero MRN: [id number] Date of Birth: 08/15/1960  Today's Date: 09/08/2016  Problem List:  Patient Active Problem List   Diagnosis Date Noted  . Cervical myelopathy with cervical radiculopathy 09/05/2016  . Tetraparesis ([place])   . Central cord syndrome ([place])   . Anemia   . Renal cyst   . ETOH abuse   . Chest wall pain   . Neuropathic pain   . Neck pain   . Chronic neck and back pain 09/01/2016  . Alcohol intoxication ([place]) 09/01/2016  . Tobacco abuse 09/01/2016  . Paraplegia following spinal cord injury ([place]) 09/01/2016   Past Medical History:  Past Medical History:  Diagnosis Date  . Chronic back pain   . Chronic neck pain   . Herniated disc, cervical    Past Surgical History: History reviewed. No pertinent surgical history. Social History:  reports that he has been smoking Cigarettes.  He has been smoking about 0.25 packs per day. He has never used smokeless tobacco. He reports that he drinks alcohol. He reports that he uses drugs, including Marijuana.  Family / Support Systems Marital Status: Single Patient Roles: Partner, Parent Spouse/Significant Other: girlfriend, Jerrye Noble @ (C) [phone] (together x 3 yrs) Children: Pt has one son, [name], who lives with pt's mother. Other Supports: pt's mother, Lynden Sollman @ [phone];  sister, Delores Anticipated Caregiver: Mom, girlfriend, sister, extended family Ability/Limitations of Caregiver: GF does not work.  Mom is 57 yo, retired, and is well.  Sister works as a Quarry manager.  Son is 57 yo.  Lillia Corporal has a daughter who is 57 and lives close by. Caregiver Availability: 24/7 Family Dynamics: Pt describes very good relationship with family and describes them as supportive.  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   Abuse/Neglect Physical Abuse: Denies Verbal Abuse: Denies Sexual Abuse: Denies Exploitation of patient/patient's resources: Denies Self-Neglect: Denies  Emotional Status Pt's affect, behavior adn adjustment status: Pt very pleasant, talktative and finds humor as he talks about his family and friends.  He denies any significant emotional distress about his injury, limitations, prognosis.  States, "I'm doing ok.  Try to stay positive and do what I need to do." Will monitor as he progresses and refer for neuropsychology as indicated. Recent Psychosocial Issues: financial strains due to sporadic employment Pyschiatric History: None Substance Abuse History: Pt denies any abuse issus.  Acknowledges that he had been drinking quite a bit the evening of his fall but does not feel this is a frequent issue/ problem for him.  Patient / Family Perceptions, Expectations & Goals Pt/Family understanding of illness & functional limitations: Pt has general understanding of his SCI and that goals are likely to be set for w/c level.  He states that he knows "it will take a lot of time...but I'm not going to give up on walking again." Premorbid pt/family roles/activities: Pt was independent and working as jobs were available. Anticipated changes in roles/activities/participation: Pt goals being set for min assist overall.  Anticipates his sister and other family to  assume any caregiver support roles they need to . Pt/family expectations/goals: "I am going to walk again...not sure about work but we'll see..."  [employer]: None Premorbid Home Care/DME Agencies:  None Transportation available at discharge: yes Resource referrals recommended: Neuropsychology  Discharge Planning Living Arrangements: Spouse/significant other Support Systems: Spouse/significant other, Children, Parent, Other relatives Type of Residence: Private residence Insurance Resources: Teacher, adult education Resources: Employment, [employer], Other (Comment) (girlfriend receives SSD income) Museum/gallery curator Screen Referred: Previously completed Living Expenses: Education officer, community Management: Patient Does the patient have any problems obtaining your medications?: Yes (Describe) (No - no insurance) Home Management: pt and girlfriend Patient/Family Preliminary Plans: Pt plans to d/c to his mother's home which is more w/c accessible.  Anticipates mother, sister, neice, etc to provide any needed physical assist. Social Work Anticipated Follow Up Needs: HH/OP Expected length of stay: 28-30 days  Clinical Impression Very pleasant, motivated gentleman here following an unwitnessed fall/ downtime and with SCI.  He talks easily with me and reports good support from his girlfriend and family.  Has 24/7 care available to him at his mother's home which is more w/c accessible.  He denies any significant emotional distress, however, will monitor and refer for neuropsych as indicated.  Kardell Virgil 09/08/2016, 5:05 PM

## 2016-09-11 ENCOUNTER — Inpatient Hospital Stay (HOSPITAL_COMMUNITY): Payer: Medicaid Other | Admitting: Occupational Therapy

## 2016-09-11 ENCOUNTER — Inpatient Hospital Stay (HOSPITAL_COMMUNITY): Payer: Medicaid Other | Admitting: *Deleted

## 2016-09-11 ENCOUNTER — Inpatient Hospital Stay (HOSPITAL_COMMUNITY): Payer: Medicaid Other | Admitting: Physical Therapy

## 2016-09-11 DIAGNOSIS — D62 Acute posthemorrhagic anemia: Secondary | ICD-10-CM

## 2016-09-11 DIAGNOSIS — R208 Other disturbances of skin sensation: Secondary | ICD-10-CM

## 2016-09-11 NOTE — Progress Notes (Signed)
Subjective/Complaints: Pt seen laying in bed this AM.  He slept well overnight.  He has questions about the imagining of his C-spine.   Review of systems: Denies chest pain, shortness breath, nausea, vomiting, diarrhea, nausea and constipation  Objective: Vital Signs: Blood pressure 112/68, pulse 76, temperature 97.9 F (36.6 C), temperature source Oral, resp. rate 16, weight 66.5 kg (146 lb 9.6 oz), SpO2 98 %. No results found. No results found for this or any previous visit (from the past 72 hour(s)).   HEENT: Poor dentition. Normocephalic. Atraumatic.  Cardio: RRR. No JVD. Neck: C-collar in place.  Resp: CTA B/L and unlabored GI: BS positive and Nontender Skin:   Intact. Warm and dry.  Neuro: Alert/Oriented Motor 3-5 left deltoid, biceps, 2- triceps, wrist extension 0/5, hand grip 1+/5 (stable) 3-/5 right deltoid muscle, biceps,2- triceps wrist extension, hand grip 0/5 3/5 at the ankle dorsiflexor, plantar flexor, 4 minus. Hip flexion, knee extension  Musc: No edema. No tenderness in extremities.  Gen.: no acute distress. Vital signs reviewed.    Assessment/Plan: 1. 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 Physiatrist is providing close team supervision and 24 hour management of active medical problems listed below. Physiatrist and rehab team continue to assess barriers to discharge/monitor patient progress toward functional and medical goals. FIM: Function - Bathing Position: Bed (Total A night bath by nursing) Body parts bathed by helper: Right arm, Left arm, Chest, Abdomen, Front perineal area, Buttocks, Right upper leg, Left upper leg, Right lower leg, Left lower leg, Back Assist Level: 2 helpers  Function- Upper Body Dressing/Undressing What is the patient wearing?: Pull over shirt/dress Pull over shirt/dress - Perfomed by helper:  Thread/unthread right sleeve, Thread/unthread left sleeve, Put head through opening, Pull shirt over trunk Function - Lower Body Dressing/Undressing What is the patient wearing?: Pants Position: Standing at sink Pants- Performed by helper: Thread/unthread right pants leg, Thread/unthread left pants leg, Pull pants up/down Non-skid slipper socks- Performed by helper: Don/doff right sock, Don/doff left sock TED Hose - Performed by helper: Don/doff right TED hose, Don/doff left TED hose Assist for footwear: Dependant Assist for lower body dressing: 2 Helpers  Function - Toileting Toileting steps completed by patient: Performs perineal hygiene, Adjust clothing prior to toileting, Adjust clothing after toileting Toileting steps completed by helper: Adjust clothing prior to toileting, Performs perineal hygiene, Adjust clothing after toileting Toileting Assistive Devices: Other (comment) (urinal) Assist level:  (Max assist)  Function - Air cabin crew transfer assistive device: Elevated toilet seat/BSC over toilet Assist level to toilet: Touching or steadying assistance (Pt > 75%) Assist level from toilet: Touching or steadying assistance (Pt > 75%)  Function - Chair/bed transfer Chair/bed transfer method: Stand pivot Chair/bed transfer assist level: Touching or steadying assistance (Pt > 75%) Chair/bed transfer assistive device: Bedrails, Armrests Chair/bed transfer details: Verbal cues for technique, Manual facilitation for weight shifting  Function - Locomotion: Wheelchair Max wheelchair distance: 58ft Assist Level: Maximal assistance (Pt 25 - 49%) Assist Level: Maximal assistance (Pt 25 - 49%) Function - Locomotion: Ambulation Assistive device: Other (comment) (platform RW) Max distance: 150 Assist level: Moderate assist (Pt 50 - 74%) Assist level: Moderate assist (Pt 50 - 74%) Assist level: Moderate assist (Pt 50 - 74%) Assist level: Moderate assist (Pt 50 -  74%)  Function - Comprehension Comprehension: Auditory Comprehension assist level: Follows complex conversation/direction with no assist  Function - Expression Expression: Verbal  Expression assist level: Expresses complex ideas: With no assist  Function - Social Interaction Social Interaction assist level: Interacts appropriately with others - No medications needed.  Function - Problem Solving Problem solving assist level: Solves basic 90% of the time/requires cueing < 10% of the time  Function - Memory Memory assist level: Complete Independence: No helper Patient normally able to recall (first 3 days only): That he or she is in a hospital, Current season, Staff names and faces Medical Problem List and Plan: 1.  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  Continue CIR, slowly improving 2.  DVT Prophylaxis/Anticoagulation: Pharmaceutical: Lovenox 3. Pain Management: Continue oxycodone prn for now.  Added ultram for moderate pain. Dysesthesias managed on current dose lyrica.  4. Mood: LCSW to follow for evaluation and support.  5. Neuropsych: This patient is capable of making decisions on his own behalf. 6. Skin/Wound Care:  Air mattress overlay. Pressure relief measures.  7. Fluids/Electrolytes/Nutrition: Monitor I/O.      Component Value Date/Time   NA 136 09/06/2016 0732   K 4.6 09/06/2016 0732   CL 99 (L) 09/06/2016 0732   CO2 28 09/06/2016 0732   GLUCOSE 94 09/06/2016 0732   BUN 12 09/06/2016 0732   CREATININE 1.02 09/06/2016 0732   CALCIUM 9.6 09/06/2016 0732   GFRNONAA >60 09/06/2016 0732   GFRAA >60 09/06/2016 0732   8. History of presyncope/syncope: Monitor for now.Unclear whether that the orthostatic changes are separate issue. Certainly this would be common after spinal cord injury,Patient has had no orthostatic drops since using the TED hose and abdominal binder 9. Anemia: question of chronic disease due to Alcohol use.Normocytic, hemoglobin  mildly decreased.  Cont to monitor CBC    Component Value Date/Time   WBC 7.8 09/06/2016 0732   RBC 4.19 (L) 09/06/2016 0732   HGB 10.9 (L) 09/06/2016 0732   HCT 33.7 (L) 09/06/2016 0732   PLT 288 09/06/2016 0732   MCV 80.4 09/06/2016 0732   MCH 26.0 09/06/2016 0732   MCHC 32.3 09/06/2016 0732   RDW 13.5 09/06/2016 0732   LYMPHSABS 2.2 09/06/2016 0732   MONOABS 0.9 09/06/2016 0732   EOSABS 0.3 09/06/2016 0732   BASOSABS 0.0 09/06/2016 0732   10. Mid chest wall pain:  Resolved  likely musculoskeletal in nature. Monitor for now.  11. Right renal cyst: Will need non-emergent follow up ultrasound for work up. 12. ETOH abuse/Tobacco: Counsel , if patient has cravings may consider NicoDerm patch  LOS (Days) 6 A FACE TO FACE EVALUATION WAS PERFORMED  Ankit Lorie Phenix 09/11/2016, 8:30 AM

## 2016-09-11 NOTE — Progress Notes (Addendum)
Physical Therapy Session Note  Patient Details  Name: Patrick Romero MRN: [id number] Date of Birth: 30-Jul-1960  Today's Date: 09/11/2016 PT Individual Time: 1400-1515 PT Individual Time Calculation (min): 75 min   Short Term Goals: Week 1:  PT Short Term Goal 1 (Week 1): Pt will perform bed mobility minA PT Short Term Goal 2 (Week 1): Pt will perform transfers consistent minA PT Short Term Goal 3 (Week 1): Pt will ambulate maxA +1 PT Short Term Goal 4 (Week 1): Pt will initiate stair training PT Short Term Goal 5 (Week 1): Pt will demonstrate OOB tolerance 3 hrs/day outside of therapy  Skilled Therapeutic Interventions/Progress Updates:    Pt received in handoff from OT in gym; pt with ted hose & abdominal binder already donned. Pt stood with close supervision for balance while placing plastic animals back in container; pt required cuing for technique and continues to demonstrate impaired fine motor use of LUE. Utilized UE ranger with RUE: pt with minimal shoulder abduction compared to shoulder flexion; pt required AAROM while using device. While sitting on EOM without BUE/BLE support pt engaged in kick ball with 5 lbs weights on each LE for BLE strengthening, sitting balance, and core strengthening. Pt ambulated 100 ft + 100 ft with PFRW and min/mod assist; pt requires more assistance with turns; no c/o dizziness or lightheadedness after ambulating. Pt demonstrates scissoring gait and impaired foot clearance (RLE worse than LLE). With UE support from [place] and min assist for balance pt performed BLE cone taps with mirror for visual feedback with task focusing on BLE strengthening & coordination. Pt transferred EOM>w/c via squat pivot with steady assist; pt unable to complete full pivot and sat on edge of w/c seat with therapist providing instruction for need to complete full pivot to prevent injury from landing on edge & pt voice understanding. At end of session pt left sitting in w/c in room with  all needs within reach.   Therapy Documentation Precautions:  Precautions Precautions: Cervical Required Braces or Orthoses: Cervical Brace Cervical Brace: Hard collar Restrictions Weight Bearing Restrictions: No Other Position/Activity Restrictions: Pt with very weak core  Pain: 8.5/10 in neck - pt reports being premedicated ~30 minutes ago, rest breaks provided PRN.  See Function Navigator for Current Functional Status.   Therapy/Group: Individual Therapy  [name] 09/11/2016, 5:02 PM

## 2016-09-11 NOTE — Progress Notes (Signed)
Physical Therapy Session Note  Patient Details  Name: Patrick Romero MRN: [id number] Date of Birth: [birth year]  Today's Date: 09/11/2016 PT Individual Time: 0925-1000 PT Individual Time Calculation (min): 35 min   Short Term Goals: Week 1:  PT Short Term Goal 1 (Week 1): Pt will perform bed mobility minA PT Short Term Goal 2 (Week 1): Pt will perform transfers consistent minA PT Short Term Goal 3 (Week 1): Pt will ambulate maxA +1 PT Short Term Goal 4 (Week 1): Pt will initiate stair training PT Short Term Goal 5 (Week 1): Pt will demonstrate OOB tolerance 3 hrs/day outside of therapy  Skilled Therapeutic Interventions/Progress Updates:  Tx focused on functional mobility training, standing balance, WC propulsion, and functional UE tasks. Pt resting in bed upon arrival, reporting 7/10 neck, shoulder and back pain. Pt premedicated and modified tx prn.  Rolling with Min A, supine>sit with Mod A, sit>supine with Min A and cues for safety and precautions throughout.  Donned TEDs, but abdominal binder needing to be laundered.   Pt performed static and dynamic sitting balance during functional tasks 2x5 min with close S and cues for reciprocal scooting.   Performed serial stand-step transfers for blocked practice x5 with up to Mod A and cues for using momentum to stand, slow controlled descent for safety.   Pt abel to stand x2 min with min/mod A at rolling table or LUE grasping tasks with animals. Standing tolerance limited due to orthostatic changes.   Pt propelled WC x40' in controlled setting with bil LEs only and Min A for steering.   Handoff to OT.   Therapy Documentation Precautions:  Precautions Precautions: Cervical Required Braces or Orthoses: Cervical Brace Cervical Brace: Hard collar Restrictions Weight Bearing Restrictions: No Other Position/Activity Restrictions: Pt with very weak core General:   Vital Signs: Therapy Vitals Temp: 97.9 F (36.6 C) Temp Source:  Oral Pulse Rate: 76 Resp: 16 BP: 112/68 Patient Position (if appropriate): Lying Oxygen Therapy SpO2: 98 % O2 Device: Not Delivered   See Function Navigator for Current Functional Status.   Therapy/Group: Individual Therapy   Kennieth Rad, PT, DPT  Dwaine Deter, Landry Mellow M 09/11/2016, 7:51 AM

## 2016-09-11 NOTE — Progress Notes (Signed)
Occupational Therapy Session Note  Patient Details  Name: Patrick Romero MRN: [id number] Date of Birth: [birth year]  Today's Date: 09/11/2016 OT Individual Time: 1000-1058 OT Individual Time Calculation (min): 58 min    Short Term Goals: Week 1:  OT Short Term Goal 1 (Week 1): Pt will be able to sit to stand with mod A of 1 to prepare for toileting.  OT Short Term Goal 2 (Week 1): Pt will be able to transfer to toilet with mod A of 1. OT Short Term Goal 3 (Week 1): Pt will be able to use his LUE as a stabilizing A when donning a shirt. OT Short Term Goal 4 (Week 1): Pt will be able to maintain sitting balance with close S for at least 10 min to enable him to sit safely on a tub bench.  Skilled Therapeutic Interventions/Progress Updates:    Pt seen for OT ADL session focusing on self care tasks. Pt received supine on mat with hand off from PT. Obtained abdominal binder and donned sitting EOM per PT request. Pt desiring to return to room to work on bathing/dressing.  Throughout session, completed mod-max stand pivot transfers though pt demonstrating much better postural control during standing trials compared to previous sessions.  Pt completed UB bathing/dressing w/c level at sink. Used bathmit on L UE to wash chest/ abdomen and R UE, assist required to wash L UE due to pt's decreased functional use of R UE and increased neck/ back pain due to fatigue. With proper set-up, pt able to thread UEs into shirt, assist to thread over head. He declined bathing LB, and required total A to don pants. He was able to maintain static standing balance with close supervision while pants pulled up. He then completed UE movements while maintaining standing balance in prep for functional standing tasks.  Grooming completed at sink with set-up for oral care using dorsal wrist support universal cuff and assist to manage cup.  He requested return to supine at end of session. Left with all needs in reach and K-pad  applied for comfort.   Therapy Documentation Precautions:  Precautions Precautions: Cervical Required Braces or Orthoses: Cervical Brace Cervical Brace: Hard collar Restrictions Weight Bearing Restrictions: No Other Position/Activity Restrictions: Pt with very weak core ADL: ADL ADL Comments: refer to functional navigator  See Function Navigator for Current Functional Status.   Therapy/Group: Individual Therapy  [name], Mackie Holness C 09/11/2016, 6:48 AM

## 2016-09-11 NOTE — Progress Notes (Signed)
Occupational Therapy Session Note  Patient Details  Name: BILAAL DESAUTEL MRN: [id number] Date of Birth: [birth year]  Today's Date: 09/11/2016 OT Individual Time: 1337-1400 OT Individual Time Calculation (min): 23 min    Short Term Goals: Week 1:  OT Short Term Goal 1 (Week 1): Pt will be able to sit to stand with mod A of 1 to prepare for toileting.  OT Short Term Goal 2 (Week 1): Pt will be able to transfer to toilet with mod A of 1. OT Short Term Goal 3 (Week 1): Pt will be able to use his LUE as a stabilizing A when donning a shirt. OT Short Term Goal 4 (Week 1): Pt will be able to maintain sitting balance with close S for at least 10 min to enable him to sit safely on a tub bench.  Skilled Therapeutic Interventions/Progress Updates:    Pt seen for OT session focusing on functional transfers, LE strengthening, and UE ROM. Pt in supine upon arrival with RN present administering pain medications. Pt agreeable to tx session. He transferred to EOB with min A, completed stand pivot transfer to w/c with min A to stand and mod-max A for placement into w/c.  He self propelled w/c to therapy gym with min A using B LEs. Required to stop and scoot back in chair x2 due to sliding out from chair. He transferred to mat in same manner as described above. Completed UE ROM utilizing UE ranger on B UEs. Completed on L UE independently in all planes. Required min A when completing on R UE with stabilizing assist at elbow and for control during anterior/posterior ranging. When completed AB/ADucction pt able to ABduct independently and required min A to ADduct through full range. Pt left reclined on mat at end of session with hand off to PT.   Therapy Documentation Precautions:  Precautions Precautions: Cervical Required Braces or Orthoses: Cervical Brace Cervical Brace: Hard collar Restrictions Weight Bearing Restrictions: No Other Position/Activity Restrictions: Pt with very weak core ADL: ADL ADL  Comments: refer to functional navigator  See Function Navigator for Current Functional Status.   Therapy/Group: Individual Therapy  [name], Lilu Mcglown C 09/11/2016, 2:16 PM

## 2016-09-12 ENCOUNTER — Inpatient Hospital Stay (HOSPITAL_COMMUNITY): Payer: Self-pay | Admitting: Physical Therapy

## 2016-09-12 ENCOUNTER — Inpatient Hospital Stay (HOSPITAL_COMMUNITY): Payer: Medicaid Other

## 2016-09-12 ENCOUNTER — Inpatient Hospital Stay (HOSPITAL_COMMUNITY): Payer: Self-pay | Admitting: Occupational Therapy

## 2016-09-12 LAB — CBC
HEMATOCRIT: 32 % — AB (ref 39.0–52.0)
HEMOGLOBIN: 10.1 g/dL — AB (ref 13.0–17.0)
MCH: 25.5 pg — AB (ref 26.0–34.0)
MCHC: 31.6 g/dL (ref 30.0–36.0)
MCV: 80.8 fL (ref 78.0–100.0)
Platelets: 419 10*3/uL — ABNORMAL HIGH (ref 150–400)
RBC: 3.96 MIL/uL — ABNORMAL LOW (ref 4.22–5.81)
RDW: 12.7 % (ref 11.5–15.5)
WBC: 6 10*3/uL (ref 4.0–10.5)

## 2016-09-12 LAB — BASIC METABOLIC PANEL
ANION GAP: 5 (ref 5–15)
BUN: 30 mg/dL — ABNORMAL HIGH (ref 6–20)
CHLORIDE: 100 mmol/L — AB (ref 101–111)
CO2: 28 mmol/L (ref 22–32)
Calcium: 9.6 mg/dL (ref 8.9–10.3)
Creatinine, Ser: 1.04 mg/dL (ref 0.61–1.24)
GFR calc Af Amer: >60 mL/min (ref >60)
GFR calc non Af Amer: >60 mL/min (ref >60)
GLUCOSE: 104 mg/dL — AB (ref 65–99)
POTASSIUM: 4.4 mmol/L (ref 3.5–5.1)
Sodium: 133 mmol/L — ABNORMAL LOW (ref 135–145)

## 2016-09-12 MED ORDER — BISACODYL 10 MG RE SUPP
10.0000 mg | Freq: Every day | RECTAL | Status: DC
  Administered 2016-09-13 – 2016-09-22 (×8): 10 mg via RECTAL
  Filled 2016-09-12 (×14): qty 1

## 2016-09-12 NOTE — Progress Notes (Signed)
Physical Therapy Session Note  Patient Details  Name: Patrick Romero MRN: [id number] Date of Birth: December 20, 1960  Today's Date: 09/12/2016 PT Individual Time: 0800-0900 and 1000-1030 PT Individual Time Calculation (min): 60 min and 30 min (total 90 min)   Short Term Goals: Week 1:  PT Short Term Goal 1 (Week 1): Pt will perform bed mobility minA PT Short Term Goal 2 (Week 1): Pt will perform transfers consistent minA PT Short Term Goal 3 (Week 1): Pt will ambulate maxA +1 PT Short Term Goal 4 (Week 1): Pt will initiate stair training PT Short Term Goal 5 (Week 1): Pt will demonstrate OOB tolerance 3 hrs/day outside of therapy  Skilled Therapeutic Interventions/Progress Updates: Tx 1: pt received semi-reclined in bed with RN present finishing breakfast; reports he is pre-medicated and no pain currently. RN removed condom catheter, and discussed goal of getting on/off toilet for bowel movements, and toilet vs urinal for urinating. Safety plan updated to reflect change in recommendation to minA stand pivot bed <>w/c/BSC or w/c <>toilet. Donned pants in bed maxA; pt able to thread LLE using LUE and universal dorsal splint; assist for threading RLE and hand-over-hand assist for pulling up pants with bridging. Supine>sit with minA for trunk control. Stand pivot transfer >w/c with minA. Instructed pt in use of LUE for locking/unlocking brakes. Seated in w/c at sink pt performs oral hygiene with setupA for placing toothbrush in cuff splint, applying toothpaste and opening mouthwash lid. Decreased control and coordination with bringing items to mouth (tooth brush, cup, mouth wash) and therapist provided minA for improving smooth control of UE ROM. W/c propulsion to gym with BLE for strengthening/coordination. Gait with platform RW x180' with min guard/close S overall, however modA with turning due to LOB to R side. Seated LUE NMR peg board for coordination and Patrick Romero, as well as sitting balance/tolerance. Pt with  urinary urgency; required totalA for clothing management, obtain/hold/empty urinal with close S for standing balance while urinating. Returned to room with gait x150' RW and min guard. Remained seated in w/c at end of session, NT alerted to pt need for brief changed due to small amount of incontinence before urinal placed. Soft call bell in reach.   Tx 2: Pt received supine in bed, denies pain and agreeable to treatment. Supine>sit minA from flat bed. Gait to/from gym with platform RW and close S>min guard. Gait in hallway weaving R/L around cones for focus on LE coordination with direction changes d/t pt consistent LOB to R side with turns to L. Improved significantly with repetition, performed close S overall with minA required 2-3 times for minor LOBs. Continued scissoring RLE however with improving compensatory strategies and coordination to avoid fall or LOB. Returned to room and remained seated in w/c at end of session, soft call bell in reach.      Therapy Documentation Precautions:  Precautions Precautions: Cervical Required Braces or Orthoses: Cervical Brace Cervical Brace: Hard collar Restrictions Weight Bearing Restrictions: No Other Position/Activity Restrictions: Pt with very weak core Pain: Pain Assessment Faces Pain Scale: Hurts a little bit   See Function Navigator for Current Functional Status.   Therapy/Group: Individual Therapy  Patrick Romero 09/12/2016, 11:53 AM

## 2016-09-12 NOTE — Progress Notes (Signed)
Physical Therapy Session Note  Patient Details  Name: Patrick Romero MRN: [id number] Date of Birth: [birth year]  Today's Date: 09/12/2016 PT Individual Time: 4166-0630 PT Individual Time Calculation (min): 30 min   Short Term Goals: Week 1:  PT Short Term Goal 1 (Week 1): Pt will perform bed mobility minA PT Short Term Goal 2 (Week 1): Pt will perform transfers consistent minA PT Short Term Goal 3 (Week 1): Pt will ambulate maxA +1 PT Short Term Goal 4 (Week 1): Pt will initiate stair training PT Short Term Goal 5 (Week 1): Pt will demonstrate OOB tolerance 3 hrs/day outside of therapy  Skilled Therapeutic Interventions/Progress Updates: Pt presented in w/c agreeable to therapy. Ambulated with PFRW to rehab gym modA, noted narrow BOS with occasional scissoring step. Performed seated static balance activites for core strengthening.Pt able to sit unsupported on Airex performing modified ab sets and leans with no c/o pain in neck/shoulders. Pt transferred to w/c via squat pivot and propelled self back to room. Returned to bed via squat pivot and performed sit to supine transfer with minA, modA for repositioning. Pt left in bed with call bell within reach and needs met.      Therapy Documentation Precautions:  Precautions Precautions: Cervical Required Braces or Orthoses: Cervical Brace Cervical Brace: Hard collar Restrictions Weight Bearing Restrictions: No Other Position/Activity Restrictions: Pt with very weak core   See Function Navigator for Current Functional Status.   Therapy/Group: Individual Therapy  Lawrence Roldan  Edda Orea, PTA  09/12/2016, 2:59 PM

## 2016-09-12 NOTE — Progress Notes (Signed)
Occupational Therapy Session Note  Patient Details  Name: Patrick Romero MRN: [id number] Date of Birth: Aug 16, 1960  Today's Date: 09/12/2016 OT Individual Time: 1400-1500 OT Individual Time Calculation (min): 60 min    Short Term Goals: Week 1:  OT Short Term Goal 1 (Week 1): Pt will be able to sit to stand with mod A of 1 to prepare for toileting.  OT Short Term Goal 2 (Week 1): Pt will be able to transfer to toilet with mod A of 1. OT Short Term Goal 3 (Week 1): Pt will be able to use his LUE as a stabilizing A when donning a shirt. OT Short Term Goal 4 (Week 1): Pt will be able to maintain sitting balance with close S for at least 10 min to enable him to sit safely on a tub bench.  Skilled Therapeutic Interventions/Progress Updates:    Pt seen for OT ADL bathing/dressing session. Pt sitting up in w/c upon arrival agreeable to tx session and for shower. He ambulated into bathroom using PFRW with min A and assist to manage RW during functional ambulation. He bathed seated on padded tub bench, assist for LEs and L UE. Utilizing bath mit on L UE to bathe. He dressed seated on tub bench, total A to don pants due to decreased functional grasp.  He returned to supine position for cervical brace pads to be changed. Educated friend who was present regarding proper care of cervical brace pads- will benefit from cont hands on education.  Pt completed grooming tasks with set-up at sink utilizing dorsal wrist support universal cuff- pt able to direct caregiver in set-up of AE. Pt left seated in w/c at end of session with friend present, all needs in reach. Set-up hands free phone device for pt for increased independence.   Therapy Documentation Precautions:  Precautions Precautions: Cervical Required Braces or Orthoses: Cervical Brace Cervical Brace: Hard collar Restrictions Weight Bearing Restrictions: No Other Position/Activity Restrictions: Pt with very weak core Pain:   No/ denies  pain ADL: ADL ADL Comments: refer to functional navigator  See Function Navigator for Current Functional Status.   Therapy/Group: Individual Therapy  [name], [name] 09/12/2016, 7:09 AM

## 2016-09-12 NOTE — Progress Notes (Signed)
Subjective/Complaints: Had a good night. Denies pain. Aware of when his bladder is about to empty. Wearing condom cath this morning.   ROS: pt denies nausea, vomiting, diarrhea, cough, shortness of breath or chest pain   Objective: Vital Signs: Blood pressure 108/65, pulse (!) 102, temperature 98.6 F (37 C), temperature source Oral, resp. rate 18, weight 66.5 kg (146 lb 9.6 oz), SpO2 98 %. No results found. Results for orders placed or performed during the hospital encounter of 09/05/16 (from the past 72 hour(s))  Basic metabolic panel     Status: Abnormal   Collection Time: 09/12/16  5:57 AM  Result Value Ref Range   Sodium 133 (L) 135 - 145 mmol/L   Potassium 4.4 3.5 - 5.1 mmol/L   Chloride 100 (L) 101 - 111 mmol/L   CO2 28 22 - 32 mmol/L   Glucose, Bld 104 (H) 65 - 99 mg/dL   BUN 30 (H) 6 - 20 mg/dL   Creatinine, Ser 1.04 0.61 - 1.24 mg/dL   Calcium 9.6 8.9 - 10.3 mg/dL   GFR calc non Af Amer >60 >60 mL/min   GFR calc Af Amer >60 >60 mL/min    Comment: (NOTE) The eGFR has been calculated using the CKD EPI equation. This calculation has not been validated in all clinical situations. eGFR's persistently <60 mL/min signify possible Chronic Kidney Disease.    Anion gap 5 5 - 15  CBC     Status: Abnormal   Collection Time: 09/12/16  5:57 AM  Result Value Ref Range   WBC 6.0 4.0 - 10.5 K/uL   RBC 3.96 (L) 4.22 - 5.81 MIL/uL   Hemoglobin 10.1 (L) 13.0 - 17.0 g/dL   HCT 32.0 (L) 39.0 - 52.0 %   MCV 80.8 78.0 - 100.0 fL   MCH 25.5 (L) 26.0 - 34.0 pg   MCHC 31.6 30.0 - 36.0 g/dL   RDW 12.7 11.5 - 15.5 %   Platelets 419 (H) 150 - 400 K/uL     HEENT: Poor dentition. Normocephalic. Atraumatic.  Cardio: RRR. No JVD. Neck: C-collar in place.  Resp: CTA B/L and unlabored GI: BS positive and Nontender Skin:   Intact. Warm and dry.  Neuro: Alert/Oriented Motor 3-5 left deltoid, biceps, 2- triceps, wrist extension 0/5, hand grip 1+/5 (unchanged) 3-/5 right deltoid muscle,  biceps,2- triceps wrist extension, hand grip 0/5--stable 3/5 at the ankle dorsiflexor, plantar flexor, 4 minus. Hip flexion, knee extension  Musc: No edema. No tenderness in extremities.  Gen.: no acute distress. Vital signs reviewed.    Assessment/Plan: 1. 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 Physiatrist is providing close team supervision and 24 hour management of active medical problems listed below. Physiatrist and rehab team continue to assess barriers to discharge/monitor patient progress toward functional and medical goals. FIM: Function - Bathing Position: Bed (Total A night bath by nursing) Body parts bathed by helper: Right arm, Left arm, Chest, Abdomen, Front perineal area, Buttocks, Right upper leg, Left upper leg, Right lower leg, Left lower leg, Back Assist Level: 2 helpers  Function- Upper Body Dressing/Undressing What is the patient wearing?: Pull over shirt/dress Pull over shirt/dress - Perfomed by patient: Thread/unthread left sleeve Pull over shirt/dress - Perfomed by helper: Thread/unthread right sleeve, Put head through opening, Pull shirt over trunk Function - Lower Body Dressing/Undressing What is the patient wearing?: Pants Position: Wheelchair/chair at sink Pants- Performed by helper:  Thread/unthread right pants leg, Thread/unthread left pants leg, Pull pants up/down Non-skid slipper socks- Performed by helper: Don/doff right sock, Don/doff left sock TED Hose - Performed by helper: Don/doff right TED hose, Don/doff left TED hose Assist for footwear: Dependant Assist for lower body dressing: 2 Helpers  Function - Toileting Toileting steps completed by patient: Performs perineal hygiene, Adjust clothing prior to toileting, Adjust clothing after toileting Toileting steps completed by helper: Adjust clothing prior to toileting,  Performs perineal hygiene, Adjust clothing after toileting Toileting Assistive Devices: Other (comment) (urinal) Assist level:  (Max assist)  Function - Air cabin crew transfer assistive device: Elevated toilet seat/BSC over toilet Assist level to toilet: Touching or steadying assistance (Pt > 75%) Assist level from toilet: Touching or steadying assistance (Pt > 75%)  Function - Chair/bed transfer Chair/bed transfer method: Squat pivot Chair/bed transfer assist level: Touching or steadying assistance (Pt > 75%) Chair/bed transfer assistive device: Bedrails, Armrests Chair/bed transfer details: Verbal cues for sequencing, Verbal cues for technique  Function - Locomotion: Wheelchair Type: Manual Max wheelchair distance: 50 ft (BLE) Assist Level: Supervision or verbal cues Assist Level: Supervision or verbal cues Function - Locomotion: Ambulation Assistive device: Walker-platform Max distance: 100 ft Assist level: Touching or steadying assistance (Pt > 75%) Assist level: Touching or steadying assistance (Pt > 75%) Assist level: Touching or steadying assistance (Pt > 75%) Assist level: Moderate assist (Pt 50 - 74%)  Function - Comprehension Comprehension: Auditory Comprehension assist level: Follows complex conversation/direction with no assist  Function - Expression Expression: Verbal Expression assist level: Expresses complex ideas: With no assist  Function - Social Interaction Social Interaction assist level: Interacts appropriately with others - No medications needed.  Function - Problem Solving Problem solving assist level: Solves basic 90% of the time/requires cueing < 10% of the time  Function - Memory Memory assist level: Recognizes or recalls 90% of the time/requires cueing < 10% of the time Patient normally able to recall (first 3 days only): That he or she is in a hospital, Current season, Staff names and faces Medical Problem List and Plan: 1.   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  Continue CIR--has shown notable improvement since I last examined him 2.  DVT Prophylaxis/Anticoagulation: Pharmaceutical: Lovenox 3. Pain Management: Continue oxycodone prn for now.  Added ultram for moderate pain. Dysesthesias managed on current dose lyrica.  4. Mood: LCSW to follow for evaluation and support.  5. Neuropsych: This patient is capable of making decisions on his own behalf. 6. Skin/Wound Care:  Air mattress overlay. Pressure relief measures.  7. Fluids/Electrolytes/Nutrition: Monitor I/O.      Component Value Date/Time   NA 133 (L) 09/12/2016 0557   K 4.4 09/12/2016 0557   CL 100 (L) 09/12/2016 0557   CO2 28 09/12/2016 0557   GLUCOSE 104 (H) 09/12/2016 0557   BUN 30 (H) 09/12/2016 0557   CREATININE 1.04 09/12/2016 0557   CALCIUM 9.6 09/12/2016 0557   GFRNONAA >60 09/12/2016 0557   GFRAA >60 09/12/2016 0557   8. History of presyncope/syncope: Monitor for now.Unclear whether that the orthostatic changes are separate issue,Patient has had no orthostatic drops since using the TED hose and abdominal binder 9. Anemia: question of chronic disease due to Alcohol use.Normocytic, hemoglobin mildly decreased.  Cont to monitor CBC    Component Value Date/Time   WBC 6.0 09/12/2016 0557   RBC 3.96 (L) 09/12/2016 0557   HGB 10.1 (L) 09/12/2016 0557   HCT 32.0 (L) 09/12/2016 0557  PLT 419 (H) 09/12/2016 0557   MCV 80.8 09/12/2016 0557   MCH 25.5 (L) 09/12/2016 0557   MCHC 31.6 09/12/2016 0557   RDW 12.7 09/12/2016 0557   LYMPHSABS 2.2 09/06/2016 0732   MONOABS 0.9 09/06/2016 0732   EOSABS 0.3 09/06/2016 0732   BASOSABS 0.0 09/06/2016 0732   10. Mid chest wall pain:  Resolved  likely musculoskeletal in nature. Monitor for now.  11. Right renal cyst: Will need non-emergent follow up ultrasound for work up. 12. ETOH abuse/Tobacco: Counsel , if patient has cravings may consider NicoDerm patch  LOS (Days) 7 A  FACE TO FACE EVALUATION WAS PERFORMED  SWARTZ,ZACHARY T 09/12/2016, 9:10 AM

## 2016-09-12 NOTE — Progress Notes (Signed)
Physical Therapy Session Note  Patient Details  Name: Patrick Romero MRN: [id number] Date of Birth: [birth year]  Today's Date: 09/12/2016 PT Individual Time: 1300-1330 PT Individual Time Calculation (min): 30 min   Short Term Goals: Week 1:  PT Short Term Goal 1 (Week 1): Pt will perform bed mobility minA PT Short Term Goal 2 (Week 1): Pt will perform transfers consistent minA PT Short Term Goal 3 (Week 1): Pt will ambulate maxA +1 PT Short Term Goal 4 (Week 1): Pt will initiate stair training PT Short Term Goal 5 (Week 1): Pt will demonstrate OOB tolerance 3 hrs/day outside of therapy  Skilled Therapeutic Interventions/Progress Updates:    Session focused on neuro re-ed to address postural control, dynamic standing balance, trunk control, and graded movement during transfers, sit <> stands, sit <> squat to sustain position, alternating toe taps with focus on coordination and control, and stair negotiation. Pt requires min assist for transfers and facilitation for weighshift and balance during standing balance activities and stairs (up to mod assist on stairs). Completed 8 steps (3") with L hand on rail and 4 steps (6") with rail using L hand. During toe taps and stairs, multimodal cues for weighshift and upright posture through trunk. End of session set up in w/c with all needs in reach.   Therapy Documentation Precautions:  Precautions Precautions: Cervical Required Braces or Orthoses: Cervical Brace Cervical Brace: Hard collar Restrictions Weight Bearing Restrictions: No Other Position/Activity Restrictions: Pt with very weak core  Pain: Denies pain. Reports receiving medication already.    See Function Navigator for Current Functional Status.   Therapy/Group: Individual Therapy  Canary Brim Ivory Broad, PT, DPT  09/12/2016, 2:47 PM

## 2016-09-13 ENCOUNTER — Inpatient Hospital Stay (HOSPITAL_COMMUNITY): Payer: Medicaid Other | Admitting: Occupational Therapy

## 2016-09-13 ENCOUNTER — Inpatient Hospital Stay (HOSPITAL_COMMUNITY): Payer: Self-pay | Admitting: Physical Therapy

## 2016-09-13 NOTE — Discharge Summary (Signed)
[name] MRN: [id number] DOB/AGE: May 10, 1961 57 y.o.  Admit date: 08/31/2016 Discharge date: 09/05/2016  Admitting Diagnosis: Cervical spine fractures Central cord syndrome  Alcohol abuse  Discharge Diagnosis Patient Active Problem List   Diagnosis Date Noted  . Acute blood loss anemia   . Dysesthesia   . History of syncope   . Cervical myelopathy with cervical radiculopathy 09/05/2016  . Tetraparesis ([place])   . Central cord syndrome ([place])   . Anemia   . Renal cyst   . ETOH abuse   . Chest wall pain   . Neuropathic pain   . Neck pain   . Chronic neck and back pain 09/01/2016  . Alcohol intoxication ([place]) 09/01/2016  . Tobacco abuse 09/01/2016  . Paraplegia following spinal cord injury [facility]) 09/01/2016    Consultants Neurosurgery - Dr. Ashok Pall  Physical Medicine and Rehabilitation - Dr. Alger Simons   Imaging: 08/31/16 CT HEAD/C-SPINE W/O -   1. Nondisplaced acute fracture through the right C3 foramen transversarium.  2. Nondisplaced acute fracture of the right inferior articular facet of the C7 vertebral body. 3. Minimally displaced acute fracture through the anterior margin of the superior endplate of the C7 vertebral body. 4. Anterior displacement of a bridging osteophyte between the C3 and C4 vertebral bodies and prevertebral edema from C2 -C5 may represent fracture through the C3-4 disc. 5. No dislocation or malalignment of the cervical spine. 6. No acute intracranial abnormality.  08/31/16 CT ANGIO NECK -  1. Minimal luminal narrowing of the right vertebral artery at the level of the right C3 transverse foramen fracture may indicate a grade 1 intimal injury. 2. Otherwise normal CTA of the neck. 3. Multiple cervical spine fractures, as previously characterized on concomitant cervical spine CT.  08/31/16 CT CHEST/ABD/PELV W/ -  1. No acute/traumatic intrathoracic, abdominal, or pelvic  pathology. 2. A 1.5 cm right renal cyst. Nonemergent ultrasound is recommended for further characterization of this cyst.  09/01/16 MR C-SPINE W/O - 1. 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 The cord is not expanded, making tumor less likely. The pattern is atypical for a demyelinating process. 2. Anterior corner fractures at C3 and C7 with prevertebral edema and hemorrhage. 3. Posterior element fractures at C7 and C3 are less well appreciated on this study. 4. Multilevel spondylosis of the cervical spine as described. 5. Mild central canal stenosis without cord compromise at the C3-4 level. 6. The most significant central canal stenosis is at C5-6 without focal cord signal abnormality at this level.  Procedures none  Hospital Course:  56 year old male with a medical history of chronic neck and lower back pain who presented to [facility] via EMS after he was found lying on the ground behind a waffle house. In the emergency department the patient was minimally responsive, but arousable, secondary to alcohol intoxication. He complained of being unable to move his right arm or legs and exhibited left upper extremity weakness. CT scan was significant for the above neurologic injuries and neurosurgery was consulted. Neurosurgery recommended a cervical collar and MRI of the neck. MRI consistent with central cord injury. Neurosurgery determined that cervical decompression was not needed. The patient was then transferred to [facility] for further management of traumatic injuries. The patient was started on Lyrica for his neuropathic pain. The patient was evaluated by physical rehabilitation and medicine and  determined to be a good candidate for [facility] inpatient rehabilitation. On 09/05/16 the patient was admitted to [facility] inpatient rehabilitation. He should follow-up with neurosurgery.  Physical  exam: General-alert and oriented, no acute distress Cardiovascular-regular rate and rhythm Pulmonary-nonlabored, clear to auscultation bilaterally Neuro-RUE0/5, RLE3/5, LLE5/5, LUE3/5.  Allergies as of 09/05/2016   No Known Allergies     Medication List    STOP taking these medications   acetaminophen 500 MG tablet Commonly known as:  TYLENOL   BC HEADACHE POWDER PO   cyclobenzaprine 5 MG tablet Commonly known as:  FLEXERIL   ibuprofen 200 MG tablet Commonly known as:  ADVIL,MOTRIN   methocarbamol 500 MG tablet Commonly known as:  ROBAXIN   PredniSONE 10 MG Kit   traMADol 50 MG tablet Commonly known as:  ULTRAM     TAKE these medications   multivitamins ther. w/minerals Tabs tablet Take 1 tablet by mouth daily.         Signed: Obie Dredge, [facility] 09/13/2016, 2:50 PM Pager: [phone] Consults: [phone] Mon-Fri 7:00 am-4:30 pm Sat-Sun 7:00 am-11:30 am

## 2016-09-13 NOTE — Progress Notes (Signed)
Physical Therapy Weekly Progress Note  Patient Details  Name: Patrick Romero MRN: [id number] Date of Birth: [birth year]  Beginning of progress report period: September 06, 2016 End of progress report period: September 13, 2016  Today's Date: 09/13/2016 PT Individual Time: 0900-1000 and 1300-1400 PT Individual Time Calculation (min): 60 min and 60 min (total 120 min)   Patient has met 4 of 5 short term goals.  Bed mobility goal unmet, however progressing towards goal; requires modA supine>sit d/t core and UE strength/coordination deficits. Pt requires min/modA overall for transfers and gait due to RLE hypertonicity and spasticity, foot drag, consistent LOBs to R side. Continues to be limited due to UE strength and coordination deficits. Pt extremely motivated and has been making consistent progress towards goals. Goals have been upgraded to S overall at an ambulatory level within the home. Anticipate need for family education prior to d/c to review recommendations for assist level at home. Pt will continue to benefit from skilled PT to progress towards long term goals.   Patient continues to demonstrate the following deficits muscle weakness, impaired timing and sequencing, abnormal tone, unbalanced muscle activation, ataxia and decreased coordination and decreased sitting balance, decreased standing balance, decreased postural control and decreased balance strategies and therefore will continue to benefit from skilled PT intervention to increase functional independence with mobility.  Patient progressing toward long term goals..  Continue plan of care.  PT Short Term Goals Week 1:  PT Short Term Goal 1 (Week 1): Pt will perform bed mobility minA PT Short Term Goal 1 - Progress (Week 1): Progressing toward goal PT Short Term Goal 2 (Week 1): Pt will perform transfers consistent minA PT Short Term Goal 2 - Progress (Week 1): Met PT Short Term Goal 3 (Week 1): Pt will ambulate maxA +1 PT Short Term  Goal 3 - Progress (Week 1): Met PT Short Term Goal 4 (Week 1): Pt will initiate stair training PT Short Term Goal 4 - Progress (Week 1): Met PT Short Term Goal 5 (Week 1): Pt will demonstrate OOB tolerance 3 hrs/day outside of therapy PT Short Term Goal 5 - Progress (Week 1): Met Week 2:  PT Short Term Goal 1 (Week 2): Pt will perform bed mobility with minA from flat bed PT Short Term Goal 2 (Week 2): Pt will perform transfers with consistent min guard PT Short Term Goal 3 (Week 2): Pt will ambulate x150' with platform RW and S PT Short Term Goal 4 (Week 2): Pt will perform ascent/descent 4 stairs 6" height with modA  Skilled Therapeutic Interventions/Progress Updates: Tx 1: Pt received seated in w/c with handoff from RN; denies pain and agreeable to treatment. Pt dons pants in sitting with S and increased time for threading BLEs; minA for pulling up pants in standing. Gait to gym with RW and BUE platforms with close S/min guard. Progressed gait with platforms to RUE platform/LUE hand splint, progressed to BUE hand splints with minA overall. Pt requires additional assistance with use of RW and hand splints, however pt will benefit from progressive balance challenge, weight bearing through UEs for NMR. Performed nustep x8 min with BUE/BLE and B hand splints to maintain hand grip on handles. Returned to room with gait RW and minA overall. Remained seated in w/c at end of session, soft call bell in reach.   Tx 2: Pt received supine in bed, denies pain and agreeable to treatment. Supine>sit with modA. Gait to/from gym with RW and minA, with B hand splints.  Dynamic standing balance with ball kicks alternating BLEs, including trapping ball for SLS and coordination. Dynamic forward/backwards/sideways walking with ball kicks and catches for BLE coordination; min guard overall however min/modA for occasional LOBs. Standing balance on foam with ball hits alternating UEs; min guard. Standing on rocker board  oriented anterior/posterior with min guard, therapist applying external perturbations to board for increased balance challenge and ankle strategy. Returned to room as above. Sit >supine with S. Remained supine with alarm intact and all needs in reach at end of session.      Therapy Documentation Precautions:  Precautions Precautions: Cervical Required Braces or Orthoses: Cervical Brace Cervical Brace: Hard collar Restrictions Weight Bearing Restrictions: No Other Position/Activity Restrictions: Pt with very weak core   See Function Navigator for Current Functional Status.  Therapy/Group: Individual Therapy  Luberta Mutter 09/13/2016, 9:55 AM

## 2016-09-13 NOTE — Progress Notes (Signed)
Subjective/Complaints: No new complaints. Moved bowels and emptied bladder this morning. Hands still weak  ROS: pt denies nausea, vomiting, diarrhea, cough, shortness of breath or chest pain   Objective: Vital Signs: Blood pressure 105/69, pulse 76, temperature 97.5 F (36.4 C), temperature source Oral, resp. rate 18, weight 66.5 kg (146 lb 9.6 oz), SpO2 100 %. No results found. Results for orders placed or performed during the hospital encounter of 09/05/16 (from the past 72 hour(s))  Basic metabolic panel     Status: Abnormal   Collection Time: 09/12/16  5:57 AM  Result Value Ref Range   Sodium 133 (L) 135 - 145 mmol/L   Potassium 4.4 3.5 - 5.1 mmol/L   Chloride 100 (L) 101 - 111 mmol/L   CO2 28 22 - 32 mmol/L   Glucose, Bld 104 (H) 65 - 99 mg/dL   BUN 30 (H) 6 - 20 mg/dL   Creatinine, Ser 1.04 0.61 - 1.24 mg/dL   Calcium 9.6 8.9 - 10.3 mg/dL   GFR calc non Af Amer >60 >60 mL/min   GFR calc Af Amer >60 >60 mL/min    Comment: (NOTE) The eGFR has been calculated using the CKD EPI equation. This calculation has not been validated in all clinical situations. eGFR's persistently <60 mL/min signify possible Chronic Kidney Disease.    Anion gap 5 5 - 15  CBC     Status: Abnormal   Collection Time: 09/12/16  5:57 AM  Result Value Ref Range   WBC 6.0 4.0 - 10.5 K/uL   RBC 3.96 (L) 4.22 - 5.81 MIL/uL   Hemoglobin 10.1 (L) 13.0 - 17.0 g/dL   HCT 32.0 (L) 39.0 - 52.0 %   MCV 80.8 78.0 - 100.0 fL   MCH 25.5 (L) 26.0 - 34.0 pg   MCHC 31.6 30.0 - 36.0 g/dL   RDW 12.7 11.5 - 15.5 %   Platelets 419 (H) 150 - 400 K/uL     HEENT: Poor dentition. Normocephalic. Atraumatic.  Cardio: RRR. Neck: C-collar in place.  Resp: CTA B GI: BS positive and Nontender Skin:   Intact. Warm and dry.  Neuro: Alert/Oriented Motor 3-5 left deltoid, biceps, 2- triceps, wrist extension 0/5, hand grip 1+/5 (stable) 3-/5 right deltoid muscle, biceps,2- triceps wrist extension, hand grip  0/5--stable 3/5 at the ankle dorsiflexor, plantar flexor, 4 minus. Hip flexion, knee extension  Musc: No edema. No tenderness in extremities.  Gen.: NAD.    Assessment/Plan: 1. 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 Physiatrist is providing close team supervision and 24 hour management of active medical problems listed below. Physiatrist and rehab team continue to assess barriers to discharge/monitor patient progress toward functional and medical goals. FIM: Function - Bathing Position: Shower Body parts bathed by patient: Right arm, Left arm, Chest, Abdomen, Front perineal area, Right upper leg, Left upper leg Body parts bathed by helper: Right lower leg, Left lower leg, Back, Buttocks Assist Level: 2 helpers  Function- Upper Body Dressing/Undressing What is the patient wearing?: Pull over shirt/dress Pull over shirt/dress - Perfomed by patient: Thread/unthread right sleeve, Thread/unthread left sleeve Pull over shirt/dress - Perfomed by helper: Put head through opening, Pull shirt over trunk Function - Lower Body Dressing/Undressing What is the patient wearing?: Pants, Ted Hose, Non-skid slipper socks Position: Other (comment) (Tub bench) Pants- Performed by patient: Thread/unthread left pants leg Pants- Performed by helper: Thread/unthread right pants leg, Pull  pants up/down, Thread/unthread left pants leg Non-skid slipper socks- Performed by helper: Don/doff right sock, Don/doff left sock TED Hose - Performed by helper: Don/doff right TED hose, Don/doff left TED hose Assist for footwear: Dependant Assist for lower body dressing: Touching or steadying assistance (Pt > 75%)  Function - Toileting Toileting steps completed by patient: Performs perineal hygiene, Adjust clothing prior to toileting, Adjust clothing after toileting Toileting steps completed  by helper: Adjust clothing prior to toileting, Performs perineal hygiene, Adjust clothing after toileting Toileting Assistive Devices: Other (comment) (urinal) Assist level:  (Max assist)  Function - Air cabin crew transfer assistive device: Elevated toilet seat/BSC over toilet Assist level to toilet: Touching or steadying assistance (Pt > 75%) Assist level from toilet: Touching or steadying assistance (Pt > 75%)  Function - Chair/bed transfer Chair/bed transfer method: Stand pivot Chair/bed transfer assist level: Touching or steadying assistance (Pt > 75%) Chair/bed transfer assistive device: Bedrails, Armrests Chair/bed transfer details: Verbal cues for sequencing, Verbal cues for technique, Tactile cues for placement, Tactile cues for posture, Tactile cues for weight shifting  Function - Locomotion: Wheelchair Type: Manual Max wheelchair distance: 150 Assist Level: Supervision or verbal cues Assist Level: Supervision or verbal cues Assist Level: Supervision or verbal cues Function - Locomotion: Ambulation Assistive device: Walker-platform Max distance: 175 Assist level: Touching or steadying assistance (Pt > 75%) Assist level: Touching or steadying assistance (Pt > 75%) Assist level: Touching or steadying assistance (Pt > 75%) Assist level: Touching or steadying assistance (Pt > 75%)  Function - Comprehension Comprehension: Auditory Comprehension assist level: Follows complex conversation/direction with no assist  Function - Expression Expression: Verbal Expression assist level: Expresses complex ideas: With no assist  Function - Social Interaction Social Interaction assist level: Interacts appropriately with others - No medications needed.  Function - Problem Solving Problem solving assist level: Solves basic 90% of the time/requires cueing < 10% of the time  Function - Memory Memory assist level: Recognizes or recalls 90% of the time/requires cueing < 10% of  the time Patient normally able to recall (first 3 days only): That he or she is in a hospital, Current season, Staff names and faces Medical Problem List and Plan: 1.  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  Continue CIR--team conference today  -needs a letter for lawyer regarding new deficits/disability hearing scheduled for later this month 2.  DVT Prophylaxis/Anticoagulation: Pharmaceutical: Lovenox 3. Pain Management: Continue oxycodone prn for now.  Added ultram for moderate pain. Dysesthesias managed on current dose lyrica.  4. Mood: LCSW to follow for evaluation and support.  5. Neuropsych: This patient is capable of making decisions on his own behalf. 6. Skin/Wound Care:  Air mattress overlay. Pressure relief measures.  7. Fluids/Electrolytes/Nutrition: encourage PO fluids  -recheck later this week     Component Value Date/Time   NA 133 (L) 09/12/2016 0557   K 4.4 09/12/2016 0557   CL 100 (L) 09/12/2016 0557   CO2 28 09/12/2016 0557   GLUCOSE 104 (H) 09/12/2016 0557   BUN 30 (H) 09/12/2016 0557   CREATININE 1.04 09/12/2016 0557   CALCIUM 9.6 09/12/2016 0557   GFRNONAA >60 09/12/2016 0557   GFRAA >60 09/12/2016 0557   8. History of presyncope/syncope: Monitor for now.Unclear whether that the orthostatic changes are separate issue,Patient has had no orthostatic drops since using the TED hose and abdominal binder 9. Anemia: question of chronic disease due to Alcohol use.Normocytic, hemoglobin mildly decreased.  Cont to monitor CBC    Component  Value Date/Time   WBC 6.0 09/12/2016 0557   RBC 3.96 (L) 09/12/2016 0557   HGB 10.1 (L) 09/12/2016 0557   HCT 32.0 (L) 09/12/2016 0557   PLT 419 (H) 09/12/2016 0557   MCV 80.8 09/12/2016 0557   MCH 25.5 (L) 09/12/2016 0557   MCHC 31.6 09/12/2016 0557   RDW 12.7 09/12/2016 0557   LYMPHSABS 2.2 09/06/2016 0732   MONOABS 0.9 09/06/2016 0732   EOSABS 0.3 09/06/2016 0732   BASOSABS 0.0 09/06/2016 0732   10.  Mid chest wall pain:  Resolved  likely musculoskeletal in nature. Monitor for now.  11. Right renal cyst: Will need non-emergent follow up ultrasound for work up. 12. ETOH abuse/Tobacco: Counsel , if patient has cravings may consider NicoDerm patch  LOS (Days) 8 A FACE TO FACE EVALUATION WAS PERFORMED  SWARTZ,ZACHARY T 09/13/2016, 9:09 AM

## 2016-09-13 NOTE — Progress Notes (Addendum)
Occupational Therapy Weekly Progress Note  Patient Details  Name: Patrick Romero MRN: [id number] Date of Birth: 10/17/1960  Beginning of progress report period: September 06, 2016 End of progress report period: September 13, 2016  Today's Date: 09/13/2016 OT Individual Time: 1045-1200 OT Individual Time Calculation (min): 75 min    Patient has met 4 of 4 short term goals.  Pt making excellent progress towards OT goals. He cont to be limited by impaired functional use of B UEs, R>L and thus requires max A for bathing/dressing tasks. Pt using dorsal wrist support for self feeding and grooming tasks with L UE, however, fatigues quickly. Pt very motivated with tx session and will cont to benefit therapy to increase independence with ADLs and reduce caregiver burden.   Patient continues to demonstrate the following deficits: muscle weakness and muscle paralysis, decreased cardiorespiratoy endurance and decreased sitting balance, decreased standing balance, decreased postural control and decreased balance strategies and therefore will continue to benefit from skilled OT intervention to enhance overall performance with BADL and Reduce care partner burden.   Patient progressing toward long term goals..  Continue plan of care.  OT Short Term Goals Week 1:  OT Short Term Goal 1 (Week 1): Pt will be able to sit to stand with mod A of 1 to prepare for toileting.  OT Short Term Goal 1 - Progress (Week 1): Met OT Short Term Goal 2 (Week 1): Pt will be able to transfer to toilet with mod A of 1. OT Short Term Goal 2 - Progress (Week 1): Met OT Short Term Goal 3 (Week 1): Pt will be able to use his LUE as a stabilizing A when donning a shirt. OT Short Term Goal 3 - Progress (Week 1): Met OT Short Term Goal 4 (Week 1): Pt will be able to maintain sitting balance with close S for at least 10 min to enable him to sit safely on a tub bench. OT Short Term Goal 4 - Progress (Week 1): Met Week 2:  OT Short Term Goal  1 (Week 2): Pt will complete oral care in standing following set-up with AE with CGA OT Short Term Goal 2 (Week 2): Pt will don pants with mod A sit <> stand.  OT Short Term Goal 3 (Week 2): Pt will use L UE to thread R UE into shirt sleeve with set-up assist.  OT Short Term Goal 4 (Week 2): Pt will complete 1/3 toileting steps with steadying assist in order to decrease caregiver burden.   Skilled Therapeutic Interventions/Progress Updates:    Pt seen for OT session focusing on functional transfers, standing balnace and UE ROM. Pt sitting up in w/c upon arrival, agreeable to tx session.  In ADL apartment, completed simulated tub/shower transfer utilizing tub transfer bench and simulated shower stall transfer with shower chair. Tub bench completed with close supervision following VCs for technique. Required min-mod A for shower stall transfer following demonstration. Pt desiring to keep working on both form of transfers before deciding what he will do at d/c.  In therapy gym, pt completed functional standing balance task, standing without UE support to reach outside BOS to retrieve items with L UE, reaching across midline and outside BOS to retrieve items- completed with min A. He then practiced turning to retrieve items, requiring mod A when turning to R, requiring min A to L.  He then returned to supine and heating pad placed on shoulders for comfort. While in supine, worked on UE ROM on   R/L with gravity and gravity eliminated. Pt with trace movements gravity eliminated in R UE, slight finger motion noted, improved from previous sessions. Pt ambulated back to room at end of session, left seated in w/c with all needs in reach.   Therapy Documentation Precautions:  Precautions Precautions: Cervical Required Braces or Orthoses: Cervical Brace Cervical Brace: Hard collar Restrictions Weight Bearing Restrictions: No Other Position/Activity Restrictions: Pt with very weak core ADL: ADL ADL  Comments: refer to functional navigator  See Function Navigator for Current Functional Status.   Therapy/Group: Individual Therapy  [name], Amy C 09/13/2016, 7:17 AM   

## 2016-09-13 NOTE — Progress Notes (Signed)
Occupational Therapy Session Note  Patient Details  Name: Patrick Romero MRN: [id number] Date of Birth: Jul 07, 1961  Today's Date: 09/13/2016 OT Individual Time: 1520-1601 OT Individual Time Calculation (min): 41 min    Short Term Goals: Week 2:  OT Short Term Goal 1 (Week 2): Pt will complete oral care in standing following set-up with AE with CGA OT Short Term Goal 2 (Week 2): Pt will don pants with mod A sit <> stand.  OT Short Term Goal 3 (Week 2): Pt will use L UE to thread R UE into shirt sleeve with set-up assist.  OT Short Term Goal 4 (Week 2): Pt will complete 1/3 toileting steps with steadying assist in order to decrease caregiver burden.   Skilled Therapeutic Interventions/Progress Updates:    Upon entering the room, pt seated in wheelchair with no c/o pain this session. Pt does report fatigue but agreeable to OT intervention. OT propelled pt via wheelchair to dayroom. OT educated and demonstrated self ROM exercises for R wrist and elbow in all planes. Pt lacing fingers with assistance for this task and performing 2 sets of 10 reps each. Pt able to actively isolate 1st-3rd digits of R hand for flexion and slight extension noted this session. Pt able to flex 4th and 5th digit and unable to isolate these digits at this time. Pt needing rest break for tasks secondary to hand fatigue. Pt returned to room with call bell and all needed items within reach.   Therapy Documentation Precautions:  Precautions Precautions: Cervical Required Braces or Orthoses: Cervical Brace Cervical Brace: Hard collar Restrictions Weight Bearing Restrictions: No Other Position/Activity Restrictions: Pt with very weak core General:   Vital Signs: Therapy Vitals Temp: 99.8 F (37.7 C) Temp Source: Oral Pulse Rate: 97 Resp: 20 BP: 108/64 Patient Position (if appropriate): Sitting Oxygen Therapy SpO2: 98 % O2 Device: Not Delivered Pain: Pain Assessment Pain Assessment: 0-10 Pain Score: 4   Pain Type: Acute pain Pain Location: Neck Pain Descriptors / Indicators: Aching;Shooting Pain Frequency: Intermittent Pain Onset: On-going Patients Stated Pain Goal: 4 Pain Intervention(s): Medication (See eMAR) ADL: ADL ADL Comments: refer to functional navigator Exercises:   Other Treatments:    See Function Navigator for Current Functional Status.   Therapy/Group: Individual Therapy  [name] 09/13/2016, 4:48 PM

## 2016-09-14 ENCOUNTER — Inpatient Hospital Stay (HOSPITAL_COMMUNITY): Payer: Self-pay | Admitting: Occupational Therapy

## 2016-09-14 ENCOUNTER — Inpatient Hospital Stay (HOSPITAL_COMMUNITY): Payer: Medicaid Other | Admitting: Occupational Therapy

## 2016-09-14 ENCOUNTER — Inpatient Hospital Stay (HOSPITAL_COMMUNITY): Payer: Self-pay | Admitting: Physical Therapy

## 2016-09-14 DIAGNOSIS — R7989 Other specified abnormal findings of blood chemistry: Secondary | ICD-10-CM

## 2016-09-14 NOTE — Patient Care Conference (Signed)
Inpatient RehabilitationTeam Conference and Plan of Care Update Date: 09/13/2016   Time: 2:15 PM    Patient Name: Patrick Romero      Medical Record Number: [id number]  Date of Birth: 1961/04/10 Sex: Male         Room/Bed: 4W10C/4W10C-01 Payor Info: Payor: MEDICAID POTENTIAL / Plan: MEDICAID POTENTIAL / Product Type: *No Product type* /    Admitting Diagnosis: fall with incomplete SCI  Admit Date/Time:  09/05/2016  4:02 PM Admission Comments: No comment available   Primary Diagnosis:  Cervical myelopathy with cervical radiculopathy Principal Problem: Cervical myelopathy with cervical radiculopathy  Patient Active Problem List   Diagnosis Date Noted  . Acute blood loss anemia   . Dysesthesia   . History of syncope   . Cervical myelopathy with cervical radiculopathy 09/05/2016  . Tetraparesis ([place])   . Central cord syndrome ([place])   . Anemia   . Renal cyst   . ETOH abuse   . Chest wall pain   . Neuropathic pain   . Neck pain   . Chronic neck and back pain 09/01/2016  . Alcohol intoxication ([place]) 09/01/2016  . Tobacco abuse 09/01/2016  . Paraplegia following spinal cord injury ([place]) 09/01/2016    Expected Discharge Date: Expected Discharge Date: 10/04/16  Team Members Present: Physician leading conference: Dr. Alger Simons Social Worker Present: Lennart Pall, LCSW Nurse Present: Dorien Chihuahua, RN PT Present: Canary Brim, Harriet Pho, PT OT Present: Napoleon Form, OT SLP Present: Stormy Fabian, SLP PPS Coordinator present : Daiva Nakayama, RN, CRRN     Current Status/Progress Goal Weekly Team Focus  Medical   cervical radiculopathy with central cord. improving bowel and bladder function  improve pain and functional mobility  bowel and bladder normalization, bp control   Bowel/Bladder   Pt voiding in urinal with assistance from staff. PVRs low. start Bowel program 6am 3/6  manage bowel and bladder with mod assist  encourgae use of self urinal with education of bowel  program   Swallow/Nutrition/ Hydration             ADL's   Max A bathing/dressing; set-up- min A grooming; mod-max A functional transfers  min A self care, S transfers  UE ROM; functional transfers; core stability; ADL re-training   Mobility   modA bed mobility, minA transfers and gait with platform RW, S sitting balance, mod/maxA standing balance without AD  S w/c level, ugraded to S gait in controlled environment, added gait in home environment and stair goal S  BLE NMR, dynamic standing balance, gait training, activity tolerance   Communication             Safety/Cognition/ Behavioral Observations            Pain   pain to neck/arms. oxy 5mg  q4hrs PRN. scheduled lyrica. KPAD in use  keep pain less than or equal to 3  assess pain q shift and medicate as needed   Skin   skin clean dry and intact          Rehab Goals Patient on target to meet rehab goals: Yes *See Care Plan and progress notes for long and short-term goals.  Barriers to Discharge: continued UE weakness which impedes self care.    Possible Resolutions to Barriers:  ongoing adaptive equipment, normalization of bowel/bladder function will not necessitate use of hands for voiding/bm at time of discharge    Discharge Planning/Teaching Needs:  Pt to d/c to mother's home which is more accessible.  Family  and girlfriend able to provide 24/7 assist.  Teaching to be scheduled.   Team Discussion:  B/b function continues to improve.  Now able to empty bladder.  Good progress but UE still very impaired.  Progressing on to regular walker.  Goals set for supervision - min assistance. Very motivated!  Revisions to Treatment Plan:  None   Continued Need for Acute Rehabilitation Level of Care: The patient requires daily medical management by a physician with specialized training in physical medicine and rehabilitation for the following conditions: Daily direction of a multidisciplinary physical rehabilitation program to ensure  safe treatment while eliciting the highest outcome that is of practical value to the patient.: Yes Daily medical management of patient stability for increased activity during participation in an intensive rehabilitation regime.: Yes Daily analysis of laboratory values and/or radiology reports with any subsequent need for medication adjustment of medical intervention for : Post surgical problems;Neurological problems  Gregrey Bloyd 09/14/2016, 9:04 AM

## 2016-09-14 NOTE — Progress Notes (Signed)
Physical Therapy Session Note  Patient Details  Name: Patrick Romero MRN: [id number] Date of Birth: Feb 14, 1961  Today's Date: 09/14/2016 PT Individual Time: 1100-1200 PT Individual Time Calculation (min): 60 min   Short Term Goals: Week 2:  PT Short Term Goal 1 (Week 2): Pt will perform bed mobility with minA from flat bed PT Short Term Goal 2 (Week 2): Pt will perform transfers with consistent min guard PT Short Term Goal 3 (Week 2): Pt will ambulate x150' with platform RW and S PT Short Term Goal 4 (Week 2): Pt will perform ascent/descent 4 stairs 6" height with modA  Skilled Therapeutic Interventions/Progress Updates: Pt presented in w/c agreeable to therapy. Transported to rehab gym for energy conservation. Stand pivot transfer to NuStep with min guard. Performed NuStep L5 x 10 min for endurance and LE strengthening. Performed Standing balance activities at mat coordination/toe tapping with cues for attempting controled movement with RLE which demonstrated improvement during 2nd and 3rd trials. Core strengthening activities at mat to improve posture and facilitate bed mobility. Pt ambulated 5f with RW with orthotics no scissoring gait and min A. Performed supine to/from sit at EOB x 2 with min guard progressing to supervision with min cues for sequencing to facilitate transfer. Pt request to remain in bed at end of session. Pt left in bed with alarm on and call bell within reach with needs met.      Therapy Documentation Precautions:  Precautions Precautions: Cervical Required Braces or Orthoses: Cervical Brace Cervical Brace: Hard collar Restrictions Weight Bearing Restrictions: No Other Position/Activity Restrictions: Pt with very weak core General:   Vital Signs:   Pain: Pain Assessment Pain Assessment: 0-10 Pain Score: 2  Pain Type: Acute pain Pain Location: Neck Pain Orientation: Right;Left Pain Radiating Towards: shoulders Pain Descriptors / Indicators: Aching Pain  Frequency: Intermittent Pain Onset: On-going Patients Stated Pain Goal: 2 Pain Intervention(s): Medication (See eMAR);Repositioned Multiple Pain Sites: No   See Function Navigator for Current Functional Status.   Therapy/Group: Individual Therapy  Derricka Mertz  Libero Puthoff, PTA  09/14/2016, 12:04 PM

## 2016-09-14 NOTE — Progress Notes (Signed)
Physical Therapy Session Note  Patient Details  Name: Patrick Romero MRN: [id number] Date of Birth: Apr 10, 1961  Today's Date: 09/14/2016 PT Individual Time: 1300-1400 PT Individual Time Calculation (min): 60 min   Short Term Goals: Week 2:  PT Short Term Goal 1 (Week 2): Pt will perform bed mobility with minA from flat bed PT Short Term Goal 2 (Week 2): Pt will perform transfers with consistent min guard PT Short Term Goal 3 (Week 2): Pt will ambulate x150' with platform RW and S PT Short Term Goal 4 (Week 2): Pt will perform ascent/descent 4 stairs 6" height with modA  Skilled Therapeutic Interventions/Progress Updates: Pt received supine in bed with sister present, denies pain and agreeable to treatment. Gait to/from gym with RW, B hand splints and min guard. Dynamic standing balance with alternating LE ball kicks with min guard overall; two major LOBs requiring maxA to recover to midline. Standing balance Wii games for weight shifting, ankle strategy, righting reactions, and facilitating LLE weight bearing. Returned to room with gait as above. Sit >supine with S. Remained supine in bed with alarm intact and all needs in reach at end of session.      Therapy Documentation Precautions:  Precautions Precautions: Cervical Required Braces or Orthoses: Cervical Brace Cervical Brace: Hard collar Restrictions Weight Bearing Restrictions: No Other Position/Activity Restrictions: Pt with very weak core   See Function Navigator for Current Functional Status.   Therapy/Group: Individual Therapy  Luberta Mutter 09/14/2016, 2:01 PM

## 2016-09-14 NOTE — Progress Notes (Signed)
Subjective/Complaints: Up in bed. No new complaints. Pain controlled. Bowels and bladder working  ROS: pt denies nausea, vomiting, diarrhea, cough, shortness of breath or chest pain   Objective: Vital Signs: Blood pressure 106/64, pulse 76, temperature 98.9 F (37.2 C), temperature source Oral, resp. rate 17, weight 66.5 kg (146 lb 9.6 oz), SpO2 97 %. No results found. Results for orders placed or performed during the hospital encounter of 09/05/16 (from the past 72 hour(s))  Basic metabolic panel     Status: Abnormal   Collection Time: 09/12/16  5:57 AM  Result Value Ref Range   Sodium 133 (L) 135 - 145 mmol/L   Potassium 4.4 3.5 - 5.1 mmol/L   Chloride 100 (L) 101 - 111 mmol/L   CO2 28 22 - 32 mmol/L   Glucose, Bld 104 (H) 65 - 99 mg/dL   BUN 30 (H) 6 - 20 mg/dL   Creatinine, Ser 1.04 0.61 - 1.24 mg/dL   Calcium 9.6 8.9 - 10.3 mg/dL   GFR calc non Af Amer >60 >60 mL/min   GFR calc Af Amer >60 >60 mL/min    Comment: (NOTE) The eGFR has been calculated using the CKD EPI equation. This calculation has not been validated in all clinical situations. eGFR's persistently <60 mL/min signify possible Chronic Kidney Disease.    Anion gap 5 5 - 15  CBC     Status: Abnormal   Collection Time: 09/12/16  5:57 AM  Result Value Ref Range   WBC 6.0 4.0 - 10.5 K/uL   RBC 3.96 (L) 4.22 - 5.81 MIL/uL   Hemoglobin 10.1 (L) 13.0 - 17.0 g/dL   HCT 32.0 (L) 39.0 - 52.0 %   MCV 80.8 78.0 - 100.0 fL   MCH 25.5 (L) 26.0 - 34.0 pg   MCHC 31.6 30.0 - 36.0 g/dL   RDW 12.7 11.5 - 15.5 %   Platelets 419 (H) 150 - 400 K/uL     HEENT: Poor dentition. Normocephalic. Atraumatic.  Cardio: RRR. Neck: C-collar in place.  Resp: CTA B GI: BS positive and Nontender Skin:   Intact. Warm and dry.  Neuro: Alert/Oriented Motor 3- to 3/5 left deltoid, biceps, 2+ to 3- triceps, wrist extension 1-2/5, hand grip 2 to 2+/5  3-/5 right deltoid muscle, biceps,2- triceps wrist extension, hand grip tr to 1/5--   3/5 at the ankle dorsiflexor, plantar flexor, 4 minus. Hip flexion, knee extension  Musc: No edema. No tenderness in extremities.  Gen.: NAD.    Assessment/Plan: 1. 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 Physiatrist is providing close team supervision and 24 hour management of active medical problems listed below. Physiatrist and rehab team continue to assess barriers to discharge/monitor patient progress toward functional and medical goals. FIM: Function - Bathing Position: Shower Body parts bathed by patient: Right arm, Left arm, Chest, Abdomen, Front perineal area, Right upper leg, Left upper leg Body parts bathed by helper: Right lower leg, Left lower leg, Back, Buttocks Assist Level: 2 helpers  Function- Upper Body Dressing/Undressing What is the patient wearing?: Pull over shirt/dress Pull over shirt/dress - Perfomed by patient: Thread/unthread right sleeve, Thread/unthread left sleeve Pull over shirt/dress - Perfomed by helper: Put head through opening, Pull shirt over trunk Function - Lower Body Dressing/Undressing What is the patient wearing?: Pants, Ted Hose, Non-skid slipper socks Position: Wheelchair/chair at sink Pants- Performed by patient: Thread/unthread right pants leg, Thread/unthread left  pants leg Pants- Performed by helper: Pull pants up/down Non-skid slipper socks- Performed by helper: Don/doff right sock, Don/doff left sock TED Hose - Performed by helper: Don/doff right TED hose, Don/doff left TED hose Assist for footwear: Dependant Assist for lower body dressing: Touching or steadying assistance (Pt > 75%)  Function - Toileting Toileting steps completed by patient: Adjust clothing prior to toileting, Performs perineal hygiene, Adjust clothing after toileting Toileting steps completed by helper:  (assist with clothing and  hygiene) Toileting Assistive Devices: Other (comment) (urinal) Assist level:  (Max assist)  Function - Air cabin crew transfer assistive device: Elevated toilet seat/BSC over toilet Assist level to toilet: Touching or steadying assistance (Pt > 75%) Assist level from toilet: Touching or steadying assistance (Pt > 75%)  Function - Chair/bed transfer Chair/bed transfer method: Stand pivot Chair/bed transfer assist level: Touching or steadying assistance (Pt > 75%) Chair/bed transfer assistive device: Bedrails, Armrests Chair/bed transfer details: Verbal cues for sequencing, Verbal cues for technique, Tactile cues for placement, Tactile cues for posture, Tactile cues for weight shifting  Function - Locomotion: Wheelchair Type: Manual Max wheelchair distance: 150 Assist Level: Supervision or verbal cues Assist Level: Supervision or verbal cues Assist Level: Supervision or verbal cues Function - Locomotion: Ambulation Assistive device: Walker-rolling, Other (comment) (B hand splints) Max distance: 175 Assist level: Touching or steadying assistance (Pt > 75%) Assist level: Touching or steadying assistance (Pt > 75%) Assist level: Touching or steadying assistance (Pt > 75%) Assist level: Touching or steadying assistance (Pt > 75%)  Function - Comprehension Comprehension: Auditory Comprehension assist level: Follows complex conversation/direction with no assist  Function - Expression Expression: Verbal Expression assist level: Expresses complex ideas: With no assist  Function - Social Interaction Social Interaction assist level: Interacts appropriately with others - No medications needed.  Function - Problem Solving Problem solving assist level: Solves basic 90% of the time/requires cueing < 10% of the time  Function - Memory Memory assist level: Recognizes or recalls 90% of the time/requires cueing < 10% of the time Patient normally able to recall (first 3 days only):  That he or she is in a hospital, Current season, Staff names and faces Medical Problem List and Plan: 1.  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  Continue CIR-   -needs a letter for lawyer regarding new deficits/disability hearing scheduled for later this month 2.  DVT Prophylaxis/Anticoagulation: Pharmaceutical: Lovenox 3. Pain Management: Continue oxycodone prn for now.  Added ultram for moderate pain. Dysesthesias managed on current dose lyrica.  4. Mood: LCSW to follow for evaluation and support.  5. Neuropsych: This patient is capable of making decisions on his own behalf. 6. Skin/Wound Care:  Air mattress overlay. Pressure relief measures.  7. Fluids/Electrolytes/Nutrition: encourage PO fluids  -recheck BMET tomorrow     Component Value Date/Time   NA 133 (L) 09/12/2016 0557   K 4.4 09/12/2016 0557   CL 100 (L) 09/12/2016 0557   CO2 28 09/12/2016 0557   GLUCOSE 104 (H) 09/12/2016 0557   BUN 30 (H) 09/12/2016 0557   CREATININE 1.04 09/12/2016 0557   CALCIUM 9.6 09/12/2016 0557   GFRNONAA >60 09/12/2016 0557   GFRAA >60 09/12/2016 0557   8. History of presyncope/syncope: Monitor for now.Unclear whether that the orthostatic changes are separate issue,Patient has had no orthostatic drops since using the TED hose and abdominal binder 9. Anemia: question of chronic disease due to Alcohol use.Normocytic, hemoglobin mildly decreased.  Cont to monitor CBC    Component  Value Date/Time   WBC 6.0 09/12/2016 0557   RBC 3.96 (L) 09/12/2016 0557   HGB 10.1 (L) 09/12/2016 0557   HCT 32.0 (L) 09/12/2016 0557   PLT 419 (H) 09/12/2016 0557   MCV 80.8 09/12/2016 0557   MCH 25.5 (L) 09/12/2016 0557   MCHC 31.6 09/12/2016 0557   RDW 12.7 09/12/2016 0557   LYMPHSABS 2.2 09/06/2016 0732   MONOABS 0.9 09/06/2016 0732   EOSABS 0.3 09/06/2016 0732   BASOSABS 0.0 09/06/2016 0732   10. Mid chest wall pain:  Resolved  likely musculoskeletal  11. Right renal cyst: Will  need non-emergent follow up ultrasound for work up. 12. ETOH abuse/Tobacco: Counsel , if patient has cravings may consider NicoDerm patch  LOS (Days) 9 A FACE TO FACE EVALUATION WAS PERFORMED  Erianna Jolly T 09/14/2016, 9:03 AM

## 2016-09-14 NOTE — Progress Notes (Signed)
Occupational Therapy Session Note  Patient Details  Name: Patrick Romero MRN: [id number] Date of Birth: 02/27/1961  Today's Date: 09/14/2016 OT Individual Time: 0915-1000 OT Individual Time Calculation (min): 45 min    Short Term Goals: Week 1:  OT Short Term Goal 1 (Week 1): Pt will be able to sit to stand with mod A of 1 to prepare for toileting.  OT Short Term Goal 1 - Progress (Week 1): Met OT Short Term Goal 2 (Week 1): Pt will be able to transfer to toilet with mod A of 1. OT Short Term Goal 2 - Progress (Week 1): Met OT Short Term Goal 3 (Week 1): Pt will be able to use his LUE as a stabilizing A when donning a shirt. OT Short Term Goal 3 - Progress (Week 1): Met OT Short Term Goal 4 (Week 1): Pt will be able to maintain sitting balance with close S for at least 10 min to enable him to sit safely on a tub bench. OT Short Term Goal 4 - Progress (Week 1): Met Week 2:  OT Short Term Goal 1 (Week 2): Pt will complete oral care in standing following set-up with AE with CGA OT Short Term Goal 2 (Week 2): Pt will don pants with mod A sit <> stand.  OT Short Term Goal 3 (Week 2): Pt will use L UE to thread R UE into shirt sleeve with set-up assist.  OT Short Term Goal 4 (Week 2): Pt will complete 1/3 toileting steps with steadying assist in order to decrease caregiver burden.   Skilled Therapeutic Interventions/Progress Updates:  1:1 engaged in LB dressing at EOB with more than reasonable amt of time to manipulate clothing. Pt able to thread pants and pulled them up to his thighs but required A to finish pulling them up around his hips. Pt able to pull up sock on left foot once started but not on the right. Completed tooth brushing in standing with red built up handle while wearing dorsal hand splints. Ambulated with RW with bilateral hand splints with min A with faclication of weight shifts to the left to better be able to advance his right LE.   1:1 NMES applied to forearm to promote  wrist/ hand extension, then flexion and grasp and then bicep flexion to promote movement coupled in PNF exercises with stabilizating support against gravity  Ratio 1:1 Rate 35 pps Waveform- Asymmetric Ramp 1.0 Pulse 300 Intensity- 30-35 Duration -  ~15 min in session    Report of pain at the beginning of session zero Report of pain at the end of session zero  No adverse reactions after treatment and is skin intact.       Therapy Documentation Precautions:  Precautions Precautions: Cervical Required Braces or Orthoses: Cervical Brace Cervical Brace: Hard collar Restrictions Weight Bearing Restrictions: No Other Position/Activity Restrictions: Pt with very weak core Pain: No c/o pain   See Function Navigator for Current Functional Status.   Therapy/Group: Individual Therapy  Willeen Cass [facility] 09/14/2016, 11:59 AM

## 2016-09-14 NOTE — Progress Notes (Signed)
Physical Therapy Session Note  Patient Details  Name: Patrick Romero MRN: [id number] Date of Birth: 05-Feb-1961  Today's Date: 09/14/2016 PT Individual Time: 1455-1610 PT Individual Time Calculation (min): 75 min   Short Term Goals: Week 2:  PT Short Term Goal 1 (Week 2): Pt will perform bed mobility with minA from flat bed PT Short Term Goal 2 (Week 2): Pt will perform transfers with consistent min guard PT Short Term Goal 3 (Week 2): Pt will ambulate x150' with platform RW and S PT Short Term Goal 4 (Week 2): Pt will perform ascent/descent 4 stairs 6" height with modA  Skilled Therapeutic Interventions/Progress Updates:   Pt received supine in bed and agreeable to PT. Supine>sit transfer with min assist from PT and min cues for sequencing.   Pt instructed in gait training on various surfaces in various setting including tile floor and carpet of controlled rehab environment and over tile and carpeted floor of gift shop and atrium of simulated community environment with and without RW, PT provided min assist with occasional mod assist to prevent LOB. Gait up to 150f for all environments.   Standing balance with 1 UE support to perform lateral and anterior step ups on 6 inch step with min assist from PT and min cues for decreased compensation through trunk.   Transfer training completed x 20 throughout treatment with min-supervision assist for safety and prevent LOB.   Pt returned to room and performed stand pivot transfer to bed with min assist. Sit>supine completed with supervision assist and left supine in bed with call bell in reach and all needs met.         Therapy Documentation Precautions:  Precautions Precautions: Cervical Required Braces or Orthoses: Cervical Brace Cervical Brace: Hard collar Restrictions Weight Bearing Restrictions: No Other Position/Activity Restrictions: Pt with very weak core Vital Signs: Therapy Vitals Temp: 99 F (37.2 C) Temp Source:  Oral Pulse Rate: 92 Resp: 17 BP: 106/64 Patient Position (if appropriate): Lying Oxygen Therapy SpO2: 98 % O2 Device: Not Delivered Pain: 0/10    See Function Navigator for Current Functional Status.   Therapy/Group: Individual Therapy  ALorie Phenix3/01/2017, 3:11 PM

## 2016-09-14 NOTE — Progress Notes (Signed)
Occupational Therapy Session Note  Patient Details  Name: Patrick Romero MRN: [id number] Date of Birth: 28-Jan-1961  Today's Date: 09/14/2016 OT Individual Time: 1000-1030 OT Individual Time Calculation (min): 30 min    Short Term Goals: Week 2:  OT Short Term Goal 1 (Week 2): Pt will complete oral care in standing following set-up with AE with CGA OT Short Term Goal 2 (Week 2): Pt will don pants with mod A sit <> stand.  OT Short Term Goal 3 (Week 2): Pt will use L UE to thread R UE into shirt sleeve with set-up assist.  OT Short Term Goal 4 (Week 2): Pt will complete 1/3 toileting steps with steadying assist in order to decrease caregiver burden.   Skilled Therapeutic Interventions/Progress Updates:    Pt seen for OT session focusing on ADL re-training and functional standing balance/ endurance. Pt sitting up in w/c upon arrival, agreeable to tx session. He ambulated with RW into bathroom with min A, requiring assist to manage RW over threshold. With heavy steadying assist and VCs for technique, pt able to pull pants down in prep for toileting task. Required max A for toilet transfer due to poor decentric control and weakness to stand from low surface. Required assist to manage pants back up over brief. Completed functional standing balance task with min A hitting beach ball back and forth using L UE to knock ball. Pt tolerated ~5 minutes dynamic sitting before requiring seated rest break Pt returned to w/c and left seated with friend present.   Therapy Documentation Precautions:  Precautions Precautions: Cervical Required Braces or Orthoses: Cervical Brace Cervical Brace: Hard collar Restrictions Weight Bearing Restrictions: No Other Position/Activity Restrictions: Pt with very weak core Pain:   Complaints of tightness in shoulders, repositioned.  ADL: ADL ADL Comments: refer to functional navigator  See Function Navigator for Current Functional Status.   Therapy/Group:  Individual Therapy  [name], [place] C 09/14/2016, 7:23 AM

## 2016-09-15 ENCOUNTER — Inpatient Hospital Stay (HOSPITAL_COMMUNITY): Payer: Self-pay | Admitting: Physical Therapy

## 2016-09-15 ENCOUNTER — Inpatient Hospital Stay (HOSPITAL_COMMUNITY): Payer: Self-pay

## 2016-09-15 ENCOUNTER — Inpatient Hospital Stay (HOSPITAL_COMMUNITY): Payer: Self-pay | Admitting: Occupational Therapy

## 2016-09-15 LAB — BASIC METABOLIC PANEL
Anion gap: 7 (ref 5–15)
BUN: 30 mg/dL — AB (ref 6–20)
CALCIUM: 9.7 mg/dL (ref 8.9–10.3)
CHLORIDE: 96 mmol/L — AB (ref 101–111)
CO2: 31 mmol/L (ref 22–32)
Creatinine, Ser: 1.04 mg/dL (ref 0.61–1.24)
GFR calc non Af Amer: >60 mL/min (ref >60)
Glucose, Bld: 86 mg/dL (ref 65–99)
Potassium: 4.4 mmol/L (ref 3.5–5.1)
Sodium: 134 mmol/L — ABNORMAL LOW (ref 135–145)

## 2016-09-15 NOTE — Progress Notes (Signed)
Social Work Patient ID: Patrick Romero, male   DOB: 10-Mar-1961, 56 y.o.   MRN: [id number]   Have reviewed team conference with pt who is aware and agreeable with targeted d/c date 3/27 and supervision/ min assist goals.  Per therapies, pt's sister in the other day and asking questions about d/c needs - have tried to contact but no answer or way to leave message.  Will continue to try.    Lynzi Meulemans, LCSW

## 2016-09-15 NOTE — Progress Notes (Signed)
Subjective/Complaints: Sitting in bed. Felt a small pop in his left shoulder when turning in bed yesterday. Feels better today  ROS: pt denies nausea, vomiting, diarrhea, cough, shortness of breath or chest pain   Objective: Vital Signs: Blood pressure 108/62, pulse 91, temperature 99 F (37.2 C), temperature source Oral, resp. rate 16, weight 66.5 kg (146 lb 9.6 oz), SpO2 99 %. No results found. Results for orders placed or performed during the hospital encounter of 09/05/16 (from the past 72 hour(s))  Basic metabolic panel     Status: Abnormal   Collection Time: 09/15/16  5:59 AM  Result Value Ref Range   Sodium 134 (L) 135 - 145 mmol/L   Potassium 4.4 3.5 - 5.1 mmol/L   Chloride 96 (L) 101 - 111 mmol/L   CO2 31 22 - 32 mmol/L   Glucose, Bld 86 65 - 99 mg/dL   BUN 30 (H) 6 - 20 mg/dL   Creatinine, Ser 1.04 0.61 - 1.24 mg/dL   Calcium 9.7 8.9 - 10.3 mg/dL   GFR calc non Af Amer >60 >60 mL/min   GFR calc Af Amer >60 >60 mL/min    Comment: (NOTE) The eGFR has been calculated using the CKD EPI equation. This calculation has not been validated in all clinical situations. eGFR's persistently <60 mL/min signify possible Chronic Kidney Disease.    Anion gap 7 5 - 15     HEENT: Poor dentition. Normocephalic. Atraumatic.  Cardio: RRR. Neck: C-collar in place.  Resp: CTA B GI: BS positive and Nontender Skin:   Intact. Warm and dry.  Neuro: Alert/Oriented Motor 3- to 3/5 left deltoid, biceps, 2+ to 3- triceps, wrist extension 3/5, hand grip 2 to 2+/5  3-/5 right deltoid muscle, biceps,2- triceps, wrist extension, hand grip tr to 1/5--  3/5 at the ankle dorsiflexor, plantar flexor, 4 minus. Hip flexion, knee extension  Musc: No edema. No tenderness in extremities.  Gen.: NAD.    Assessment/Plan: 1. Functional deficits secondary to tetraplegia central cord syndrome which require 3+ hours per day of interdisciplinary therapy in a comprehensive inpatient rehab  setting. Physiatrist is providing close team supervision and 24 hour management of active medical problems listed below. Physiatrist and rehab team continue to assess barriers to discharge/monitor patient progress toward functional and medical goals. FIM: Function - Bathing Position: Shower Body parts bathed by patient: Right arm, Left arm, Chest, Abdomen, Front perineal area, Right upper leg, Left upper leg Body parts bathed by helper: Right lower leg, Left lower leg, Back, Buttocks Assist Level: 2 helpers  Function- Upper Body Dressing/Undressing What is the patient wearing?: Pull over shirt/dress Pull over shirt/dress - Perfomed by patient: Thread/unthread right sleeve, Thread/unthread left sleeve Pull over shirt/dress - Perfomed by helper: Put head through opening, Pull shirt over trunk Function - Lower Body Dressing/Undressing What is the patient wearing?: Pants, Ted Hose, Non-skid slipper socks Position: Wheelchair/chair at [employer]- Performed by patient: Thread/unthread right pants leg, Thread/unthread left pants leg Pants- Performed by helper: Pull pants up/down Non-skid slipper socks- Performed by helper: Don/doff right sock, Don/doff left sock TED Hose - Performed by helper: Don/doff right TED hose, Don/doff left TED hose Assist for footwear: Dependant Assist for lower body dressing: Touching or steadying assistance (Pt > 75%)  Function - Toileting Toileting steps completed by patient: Adjust clothing prior to toileting, Performs perineal hygiene, Adjust clothing after toileting Toileting steps completed by helper:  (assist with clothing and hygiene) Toileting Assistive Devices: Grab bar or rail Assist level:  Set up/obtain supplies  Function - Toilet Transfers Toilet transfer assistive device: Elevated toilet seat/BSC over toilet Assist level to toilet: Touching or steadying assistance (Pt > 75%) Assist level from toilet: Touching or steadying assistance (Pt >  75%)  Function - Chair/bed transfer Chair/bed transfer method: Stand pivot Chair/bed transfer assist level: Supervision or verbal cues Chair/bed transfer assistive device: Bedrails, Armrests Chair/bed transfer details: Verbal cues for sequencing, Verbal cues for technique, Tactile cues for placement, Tactile cues for posture, Tactile cues for weight shifting  Function - Locomotion: Wheelchair Type: Manual Max wheelchair distance: 150 Assist Level: Supervision or verbal cues Assist Level: Supervision or verbal cues Assist Level: Supervision or verbal cues Function - Locomotion: Ambulation Assistive device: No device Max distance: 175 Assist level: Touching or steadying assistance (Pt > 75%) Assist level: Touching or steadying assistance (Pt > 75%) Assist level: Touching or steadying assistance (Pt > 75%) Assist level: Touching or steadying assistance (Pt > 75%)  Function - Comprehension Comprehension: Auditory Comprehension assist level: Follows basic conversation/direction with no assist  Function - Expression Expression: Verbal Expression assist level: Expresses basic needs/ideas: With no assist  Function - Social Interaction Social Interaction assist level: Interacts appropriately 90% of the time - Needs monitoring or encouragement for participation or interaction.  Function - Problem Solving Problem solving assist level: Solves basic problems with no assist  Function - Memory Memory assist level: Recognizes or recalls 90% of the time/requires cueing < 10% of the time Patient normally able to recall (first 3 days only): That he or she is in a hospital, Current season, Staff names and faces Medical Problem List and Plan: 1.  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  Continue CIR-   -needs a letter for lawyer regarding new deficits/disability hearing scheduled for later this month 2.  DVT Prophylaxis/Anticoagulation: Pharmaceutical: Lovenox 3. Pain  Management: Continue oxycodone prn for now.  Added ultram for moderate pain. Dysesthesias managed on current dose lyrica.  4. Mood: LCSW to follow for evaluation and support.  5. Neuropsych: This patient is capable of making decisions on his own behalf. 6. Skin/Wound Care:  Air mattress overlay. Pressure relief measures.  7. Fluids/Electrolytes/Nutrition:    -I personally reviewed the patient's labs today.   -still a little dry----encourage fluids     Component Value Date/Time   NA 134 (L) 09/15/2016 0559   K 4.4 09/15/2016 0559   CL 96 (L) 09/15/2016 0559   CO2 31 09/15/2016 0559   GLUCOSE 86 09/15/2016 0559   BUN 30 (H) 09/15/2016 0559   CREATININE 1.04 09/15/2016 0559   CALCIUM 9.7 09/15/2016 0559   GFRNONAA >60 09/15/2016 0559   GFRAA >60 09/15/2016 0559   8. History of presyncope/syncope: Monitor for now.Unclear whether that the orthostatic changes are separate issue,Patient has had no orthostatic drops since using the TED hose and abdominal binder 9. Anemia: question of chronic disease due to Alcohol use.Normocytic, hemoglobin mildly decreased.  Cont to monitor CBC    Component Value Date/Time   WBC 6.0 09/12/2016 0557   RBC 3.96 (L) 09/12/2016 0557   HGB 10.1 (L) 09/12/2016 0557   HCT 32.0 (L) 09/12/2016 0557   PLT 419 (H) 09/12/2016 0557   MCV 80.8 09/12/2016 0557   MCH 25.5 (L) 09/12/2016 0557   MCHC 31.6 09/12/2016 0557   RDW 12.7 09/12/2016 0557   LYMPHSABS 2.2 09/06/2016 0732   MONOABS 0.9 09/06/2016 0732   EOSABS 0.3 09/06/2016 0732   BASOSABS 0.0 09/06/2016 0732   10.  Mid chest wall pain:  Resolved  likely musculoskeletal  11. Right renal cyst: Will need non-emergent follow up ultrasound for work up.  12. ETOH abuse/Tobacco: Counsel as appropriate  LOS (Days) 10 A FACE TO FACE EVALUATION WAS PERFORMED  Akif Weldy T 09/15/2016, 9:14 AM

## 2016-09-15 NOTE — Progress Notes (Signed)
Occupational Therapy Session Note  Patient Details  Name: Patrick Romero MRN: [id number] Date of Birth: 05/31/1961  Today's Date: 09/15/2016 OT Individual Time:  9:00- 10:25 and 1630-1700   85 min and 30 min   Short Term Goals: Week 2:  OT Short Term Goal 1 (Week 2): Pt will complete oral care in standing following set-up with AE with CGA OT Short Term Goal 2 (Week 2): Pt will don pants with mod A sit <> stand.  OT Short Term Goal 3 (Week 2): Pt will use L UE to thread R UE into shirt sleeve with set-up assist.  OT Short Term Goal 4 (Week 2): Pt will complete 1/3 toileting steps with steadying assist in order to decrease caregiver burden.   Skilled Therapeutic Interventions/Progress Updates:    Session 1: Pt seated in w/c upon arrival with no pain reported and agreeable to tx. Focus of session on self feeding, functional mobility and therapeutic exercise. Pt requests to use urinal and stand pivot transfer into bed with MIN A for steadying balance and Vc for safety awareness. Pt requires total A to manage pants and place urinal. Pt ambulates to/from all therapeutic destinations with increased time, MIN A for steadyig and VC for step through pattern with RLE. Pt sits EOB to eat with built up handle on utensil and plate guard. Pt required MIN A to reposition handling of utensil. Pt able to complete hand to mouth excursion with LUE. With built up handle, OT places toothpaste on toothbrush and pt stands at sink with steady assist to brush teeth. Pt transfers laundry from washer>dryer with steady assist and is able to retrieve clothing when dropped on floor. When making coffee, Pt able to hold and place K-cup but unable to grip styrofoam cup and pull out of dispenser. Pt able to cary cup of coffee with lid in LUE while walking to improve grip strength. Pt completes towel glides/self ROM program 1x30 reps each exercise to improve RUE strength/ROM and endurance. Pt requires short rest breaks in between  exercises. Pt left in room with quick release belt in place and all needs met.   Session 2: Pt seen supine in bed with significant other present. Pt doffs shirt with VC to pull shirt from behind neck. Pt dons pull over shirt with MIN A to put head through head hole. Pt able to recall hemi dressing technique and places hand through sleeve hole and uses trunk flexion to assist to bring sleeve past elbow. Pt requests to complete NuStep and ambulates to/from gym with touching assistance. Pt has L LOB with MOD A to recover while turning. Pt educated on not crossing feet while walking to turn. Pt retrieves NuStep velcro hand attachment and places hand through straps and secures the velcro to improve FMC of L hand. Pt rides for 8 min at level 8 resistance with 1 rest break due to spasms in RLE. Pt returns to room with call light in place and all needs met.   Therapy Documentation Precautions:  Precautions Precautions: Cervical Required Braces or Orthoses: Cervical Brace Cervical Brace: Hard collar Restrictions Weight Bearing Restrictions: No Other Position/Activity Restrictions: Pt with very weak core ADL: ADL ADL Comments: refer to functional navigator  See Function Navigator for Current Functional Status.   Therapy/Group: Individual Therapy  Tonny Branch 09/15/2016, 10:41 AM

## 2016-09-15 NOTE — Progress Notes (Signed)
NCSSR reviewed by office and no conflict noted with multiple prescribers.  

## 2016-09-15 NOTE — Progress Notes (Signed)
Occupational Therapy Session Note  Patient Details  Name: Patrick Romero MRN: [id number] Date of Birth: 1961-06-27  Today's Date: 09/15/2016 OT Individual Time: 1300-1400 OT Individual Time Calculation (min): 60 min    Short Term Goals: Week 2:  OT Short Term Goal 1 (Week 2): Pt will complete oral care in standing following set-up with AE with CGA OT Short Term Goal 2 (Week 2): Pt will don pants with mod A sit <> stand.  OT Short Term Goal 3 (Week 2): Pt will use L UE to thread R UE into shirt sleeve with set-up assist.  OT Short Term Goal 4 (Week 2): Pt will complete 1/3 toileting steps with steadying assist in order to decrease caregiver burden.   Skilled Therapeutic Interventions/Progress Updates:    Pt seen for OT session focusing on functional standing balance/ endurance during ADL/IADL tasks. Pt sitting up in w/c upon arrival with NT providing total A for feeding. Hand off to OT who set pt up with built up gripper and plate guard. With proper set-up, pt able to self feed independently. Encouraged pt to direct caregivers for proper set-up prior to meals for increased independence and reduce caregiver burden.  He then ambulated into bathroom with min A, no AD. Completed toilet transfer to elevated BSC over toilet with min-mod A, simulated toileting task with assist to pull up/down pants over R hip. He ambulated throughout unit in same manner as described above. Gathered clothes from laundry machine and min A dynamic balance and assist to complete task with B UEs. At table, pt stood to fold clothes using L UE and support of table. Worked on gross reaching with R UE and min A. Seated rest breaks provided throughout. Pt ambulated back to room at end of session, left seated in w/c with all needs in reach and QRB donned.   Therapy Documentation Precautions:  Precautions Precautions: Cervical Required Braces or Orthoses: Cervical Brace Cervical Brace: Hard collar Restrictions Weight  Bearing Restrictions: No Other Position/Activity Restrictions: Pt with very weak core Pain:   No/ denies pain ADL: ADL ADL Comments: refer to functional navigator  See Function Navigator for Current Functional Status.   Therapy/Group: Individual Therapy  [name], Bettylee Feig C 09/15/2016, 7:13 AM

## 2016-09-15 NOTE — Progress Notes (Signed)
Physical Therapy Session Note  Patient Details  Name: Patrick Romero MRN: [id number] Date of Birth: 1960/10/19  Today's Date: 09/15/2016 PT Individual Time: 0800-0900 PT Individual Time Calculation (min): 60 min   Short Term Goals: Week 2:  PT Short Term Goal 1 (Week 2): Pt will perform bed mobility with minA from flat bed PT Short Term Goal 2 (Week 2): Pt will perform transfers with consistent min guard PT Short Term Goal 3 (Week 2): Pt will ambulate x150' with platform RW and S PT Short Term Goal 4 (Week 2): Pt will perform ascent/descent 4 stairs 6" height with modA  Skilled Therapeutic Interventions/Progress Updates: Pt received supine in bed, denies pain and agreeable to treatment. Pants donned bed level with minA for pulling pants over hips in bridge. Supine>sit with S and increased time. Requires totalA for doffing shirt, maxA for donning new shirt with pt only able to thread LUE. Gait while carrying bag of laundry to laundry room; minA for balance d/t RLE scissoring and poor foot clearance. Pt used LUE to manage washer buttons with increased time. Nustep x8 min with BUE/BLE, B hand splints to maintain grip on handles, level 8 per pt request and average 40 steps/min. Standing balance on foam wedge for anterior tib facilitation with dynamic LUE reaching to engage in tic tac toe game on mirror; use of LUE for writing with dry erase marker, using washcloth to wipe mirror clean, and min guard for retrieving marker, washcloth off the floor when dropped. Returned to room with min guard/minA gait x150'. Remained seated in w/c at end of session, all needs in reach. RN alerted to new observation of clonus RLE when attempting to put foot on footrest.      Therapy Documentation Precautions:  Precautions Precautions: Cervical Required Braces or Orthoses: Cervical Brace Cervical Brace: Hard collar Restrictions Weight Bearing Restrictions: No Other Position/Activity Restrictions: Pt with very weak  core   See Function Navigator for Current Functional Status.   Therapy/Group: Individual Therapy  Luberta Mutter 09/15/2016, 9:03 AM

## 2016-09-16 ENCOUNTER — Inpatient Hospital Stay (HOSPITAL_COMMUNITY): Payer: Self-pay | Admitting: Physical Therapy

## 2016-09-16 ENCOUNTER — Inpatient Hospital Stay (HOSPITAL_COMMUNITY): Payer: Self-pay

## 2016-09-16 ENCOUNTER — Inpatient Hospital Stay (HOSPITAL_COMMUNITY): Payer: Medicaid Other | Admitting: Occupational Therapy

## 2016-09-16 MED ORDER — NAPHAZOLINE-GLYCERIN 0.012-0.2 % OP SOLN
1.0000 [drp] | Freq: Four times a day (QID) | OPHTHALMIC | Status: DC | PRN
  Filled 2016-09-16: qty 15

## 2016-09-16 NOTE — Progress Notes (Signed)
Physical Therapy Session Note  Patient Details  Name: BRIGHT SPIELMANN MRN: [id number] Date of Birth: 10-31-1960  Today's Date: 09/16/2016 PT Individual Time: 0800-0900 PT Individual Time Calculation (min): 60 min   Short Term Goals: Week 2:  PT Short Term Goal 1 (Week 2): Pt will perform bed mobility with minA from flat bed PT Short Term Goal 2 (Week 2): Pt will perform transfers with consistent min guard PT Short Term Goal 3 (Week 2): Pt will ambulate x150' with platform RW and S PT Short Term Goal 4 (Week 2): Pt will perform ascent/descent 4 stairs 6" height with modA  Skilled Therapeutic Interventions/Progress Updates: Pt received supine in bed, denies pain and agreeable to treatment. Supine>sit with HOB elevated and S. Ambulatory transfer to w/c at sink with min guard. TEDs donned totalA. Pt dons shoes with modA for sliding R heel into shoe and tying B shoes. Oral hygiene seated at sink with setupA. Gait to gym with no AD and min guard/minA d/t occasional LOB to R side. Seated on mat table, pt instructed in writing at table top with LUE, built up handle donned to pen, and marker used without gripper. Gait with RLE PLS AFO and min guard/minA. Somewhat improved foot clearance, however continues to be limited by extensor tone and full knee extension during swing through. Gait around cones with min guard, and min guard for standing balance while retrieving cones off floor and carrying to counter. Gait to return to room min guard while carrying notebook, pen, marker for dual task challenge. Remained seated in w/c with quick release belt intact and all needs in reach at end of session.       Therapy Documentation Precautions:  Precautions Precautions: Cervical Required Braces or Orthoses: Cervical Brace Cervical Brace: Hard collar Restrictions Weight Bearing Restrictions: No Other Position/Activity Restrictions: Pt with very weak core   See Function Navigator for Current Functional  Status.   Therapy/Group: Individual Therapy  Luberta Mutter 09/16/2016, 9:08 AM

## 2016-09-16 NOTE — Progress Notes (Signed)
Subjective/Complaints: Up at sink brushing teeth. Denies pain. Feeling well.  ROS: pt denies nausea, vomiting, diarrhea, cough, shortness of breath or chest pain  Objective: Vital Signs: Blood pressure 124/76, pulse (!) 101, temperature 99 F (37.2 C), temperature source Oral, resp. rate 16, weight 66.5 kg (146 lb 9.6 oz), SpO2 100 %. No results found. Results for orders placed or performed during the hospital encounter of 09/05/16 (from the past 72 hour(s))  Basic metabolic panel     Status: Abnormal   Collection Time: 09/15/16  5:59 AM  Result Value Ref Range   Sodium 134 (L) 135 - 145 mmol/L   Potassium 4.4 3.5 - 5.1 mmol/L   Chloride 96 (L) 101 - 111 mmol/L   CO2 31 22 - 32 mmol/L   Glucose, Bld 86 65 - 99 mg/dL   BUN 30 (H) 6 - 20 mg/dL   Creatinine, Ser 1.04 0.61 - 1.24 mg/dL   Calcium 9.7 8.9 - 10.3 mg/dL   GFR calc non Af Amer >60 >60 mL/min   GFR calc Af Amer >60 >60 mL/min    Comment: (NOTE) The eGFR has been calculated using the CKD EPI equation. This calculation has not been validated in all clinical situations. eGFR's persistently <60 mL/min signify possible Chronic Kidney Disease.    Anion gap 7 5 - 15     HEENT: Poor dentition. Normocephalic. Atraumatic.  Cardio: Reg rhythm, tachy. Neck: C-collar in place.  Resp: clear bilaterally GI: BS positive and Nontender Skin:   Intact. Warm and dry.  Neuro: Alert/Oriented Motor 3- to 3/5 left deltoid, biceps, 2+ to 3- triceps, wrist extension 3/5, hand grip 2 to 2+/5  3-/5 right deltoid muscle, biceps,2- triceps, wrist extension, hand grip tr to 1/5--  3/5 at the ankle dorsiflexor, plantar flexor, 4 minus. Hip flexion, knee extension ----motor exam stable -using tenodesis splint to assist RUE with ADL's Musc: No edema. No tenderness in extremities.  Gen.: NAD.    Assessment/Plan: 1. 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 Physiatrist is providing close team supervision and 24 hour management of active medical problems listed below. Physiatrist and rehab team continue to assess barriers to discharge/monitor patient progress toward functional and medical goals. FIM: Function - Bathing Position: Shower Body parts bathed by patient: Right arm, Left arm, Chest, Abdomen, Front perineal area, Right upper leg, Left upper leg Body parts bathed by helper: Right lower leg, Left lower leg, Back, Buttocks Assist Level: 2 helpers  Function- Upper Body Dressing/Undressing What is the patient wearing?: Pull over shirt/dress Pull over shirt/dress - Perfomed by patient: Thread/unthread right sleeve, Pull shirt over trunk, Thread/unthread left sleeve Pull over shirt/dress - Perfomed by helper: Put head through opening Function - Lower Body Dressing/Undressing What is the patient wearing?: Shoes, Ted Hose Position: Wheelchair/chair at [employer]- Performed by patient: Thread/unthread right pants leg, Thread/unthread left pants leg Pants- Performed by helper: Pull pants up/down Non-skid slipper socks- Performed by helper: Don/doff right sock, Don/doff left sock Shoes - Performed by patient: Don/doff left shoe Shoes - Performed by helper: Don/doff right shoe, Fasten right, Fasten left TED Hose - Performed by helper: Don/doff right TED hose, Don/doff left TED hose Assist for footwear: Partial/moderate assist Assist for lower body dressing: Touching or steadying assistance (Pt > 75%)  Function - Toileting Toileting steps completed by patient: Adjust clothing prior to toileting, Performs perineal hygiene, Adjust clothing after toileting  Toileting steps completed by helper:  (assist with clothing and hygiene) Toileting Assistive Devices: Grab bar or rail Assist level: Set up/obtain supplies, Touching or steadying assistance (Pt.75%)  Function - Toilet Transfers Toilet transfer assistive  device: Elevated toilet seat/BSC over toilet Assist level to toilet: Touching or steadying assistance (Pt > 75%) Assist level from toilet: Touching or steadying assistance (Pt > 75%)  Function - Chair/bed transfer Chair/bed transfer method: Ambulatory Chair/bed transfer assist level: Touching or steadying assistance (Pt > 75%) Chair/bed transfer assistive device: Bedrails, Armrests Chair/bed transfer details: Verbal cues for technique, Tactile cues for weight shifting  Function - Locomotion: Wheelchair Type: Manual Max wheelchair distance: 150 Assist Level: Supervision or verbal cues Assist Level: Supervision or verbal cues Assist Level: Supervision or verbal cues Function - Locomotion: Ambulation Assistive device: No device Max distance: 175 Assist level: Touching or steadying assistance (Pt > 75%) Assist level: Touching or steadying assistance (Pt > 75%) Assist level: Touching or steadying assistance (Pt > 75%) Assist level: Touching or steadying assistance (Pt > 75%)  Function - Comprehension Comprehension: Auditory Comprehension assist level: Follows basic conversation/direction with no assist  Function - Expression Expression: Verbal Expression assist level: Expresses basic needs/ideas: With no assist  Function - Social Interaction Social Interaction assist level: Interacts appropriately 90% of the time - Needs monitoring or encouragement for participation or interaction.  Function - Problem Solving Problem solving assist level: Solves basic problems with no assist  Function - Memory Memory assist level: Recognizes or recalls 75 - 89% of the time/requires cueing 10 - 24% of the time Patient normally able to recall (first 3 days only): That he or she is in a hospital, Current season, Staff names and faces Medical Problem List and Plan: 1.  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  Continue CIR-   -pt remains motivated 2.  DVT  Prophylaxis/Anticoagulation: Pharmaceutical: Lovenox 3. Pain Management: Continue oxycodone prn for now.  Added ultram for moderate pain. Dysesthesias managed on current dose lyrica.  4. Mood: LCSW to follow for evaluation and support.  5. Neuropsych: This patient is capable of making decisions on his own behalf. 6. Skin/Wound Care:  Air mattress overlay. Pressure relief measures.  7. Fluids/Electrolytes/Nutrition:     -still a little dry----encourage fluids  -recheck bmet Monday     Component Value Date/Time   NA 134 (L) 09/15/2016 0559   K 4.4 09/15/2016 0559   CL 96 (L) 09/15/2016 0559   CO2 31 09/15/2016 0559   GLUCOSE 86 09/15/2016 0559   BUN 30 (H) 09/15/2016 0559   CREATININE 1.04 09/15/2016 0559   CALCIUM 9.7 09/15/2016 0559   GFRNONAA >60 09/15/2016 0559   GFRAA >60 09/15/2016 0559   8. History of presyncope/syncope: Monitor for now.Unclear whether that the orthostatic changes are separate issue,Patient has had no orthostatic drops since using the TED hose and abdominal binder 9. Anemia: question of chronic disease due to Alcohol use.Normocytic, hemoglobin mildly decreased.  Cont to monitor CBC    Component Value Date/Time   WBC 6.0 09/12/2016 0557   RBC 3.96 (L) 09/12/2016 0557   HGB 10.1 (L) 09/12/2016 0557   HCT 32.0 (L) 09/12/2016 0557   PLT 419 (H) 09/12/2016 0557   MCV 80.8 09/12/2016 0557   MCH 25.5 (L) 09/12/2016 0557   MCHC 31.6 09/12/2016 0557   RDW 12.7 09/12/2016 0557   LYMPHSABS 2.2 09/06/2016 0732   MONOABS 0.9 09/06/2016 0732   EOSABS 0.3 09/06/2016 0732   BASOSABS 0.0 09/06/2016 0732  10. Mid chest wall pain:  Resolved  likely musculoskeletal  11. Right renal cyst: Will need non-emergent follow up ultrasound for work up.  12. ETOH abuse/Tobacco: Counsel as appropriate  LOS (Days) 11 A FACE TO FACE EVALUATION WAS PERFORMED  SWARTZ,ZACHARY T 09/16/2016, 9:18 AM

## 2016-09-16 NOTE — Progress Notes (Signed)
Orthopedic Tech Progress Note Patient Details:  Patrick Romero March 17, 1961 [id number]  Ortho Devices Type of Ortho Device: Arm sling Ortho Device/Splint Location: rue Ortho Device/Splint Interventions: Application   Hildred Priest 09/16/2016, 11:57 AM

## 2016-09-16 NOTE — Progress Notes (Signed)
Occupational Therapy Session Note  Patient Details  Name: Patrick Romero MRN: [id number] Date of Birth: 24-Dec-1960  Today's Date: 09/16/2016 OT Individual Time: 1100-1200 and 1400-1500 OT Individual Time Calculation (min): 60 min and 60 min   Short Term Goals: Week 2:  OT Short Term Goal 1 (Week 2): Pt will complete oral care in standing following set-up with AE with CGA OT Short Term Goal 2 (Week 2): Pt will don pants with mod A sit <> stand.  OT Short Term Goal 3 (Week 2): Pt will use L UE to thread R UE into shirt sleeve with set-up assist.  OT Short Term Goal 4 (Week 2): Pt will complete 1/3 toileting steps with steadying assist in order to decrease caregiver burden.   Skilled Therapeutic Interventions/Progress Updates:    Session One: Pt seen for OT session focusing on UE functional use and general activity tolerance/ balance. Pt sitting up in w/c upon arrival, agreeable to tx session. He ambulated ~30 ft into hallway without AD. Pt with increased scissoring and LOB episodes during ambulation and requested return to w/c for remainder of way to therapy gym. Pt voiced just having an off day today. In Therapy gym, stood at mirror to complete hang man activity focusing on shoulder flexion and fine motor hand manipulation of marker pt pt required to reach up to write on mirror. Pt tolerated ~5 minutes static standing x2 trials of activity with seated rest break btwn trials.  He then completed 10 minutes on NuStep at level 8 using B LEs. Pt with complaints of R shoulder pain, no subluxation felt. Applied kinesiotape to facilitate positioning in joint and obtained order for sling to be used during ambulation for shoulder support. Instructed pt in purpose and wear schedule. Pt returned to room at end of session, left seated in w/c with all needs in reach.   Session Two: Pt seen for OT session focusing on UE ROM and neuro re-ed with ROM. Pt sitting up in w/c upon arrival with friend present,  agreeable to tx session. Shoes donned total A for time management. Pt reported having gotten up independently when friends/ family was present and reports standing in TED hose only. Educated extensively regarding pt's high fall risk and need for assist- he voiced understanding.  Pt ambulated to [facility] gym with min A and occasional VCs for R LE clearance when walking.  Completed standing activity utilizing Dynavision focusing on dynamic standing balance and L UE ROM and finger isolation to hit buttons.  He then completed on R UE with assist at wrist and elbow for extension and ROM. Completed supine stretching for overhead movements. In supine position pt demonstrated R elbow flexion/extension against gravity, gross grasping abilities and trace wrist flexion/extension. However, did not carry over when returned to sitting EOM. Completed gross grasp using foam block, assist provided for utilization of tenodesis tenodesies for grasp and release. Pt returned to room in same manner as described above, left in supine with all needs in reach and bed alarm on.   Therapy Documentation Precautions:  Precautions Precautions: Cervical Required Braces or Orthoses: Cervical Brace Cervical Brace: Hard collar Restrictions Weight Bearing Restrictions: No Other Position/Activity Restrictions: Pt with very weak core ADL: ADL ADL Comments: refer to functional navigator  See Function Navigator for Current Functional Status.   Therapy/Group: Individual Therapy  [name], Livi Mcgann C 09/16/2016, 7:12 AM

## 2016-09-16 NOTE — Progress Notes (Signed)
Occupational Therapy Session Note  Patient Details  Name: Patrick Romero MRN: [id number] Date of Birth: 1961-04-21  Today's Date: 09/16/2016 OT Individual Time: 9068-9340 OT Individual Time Calculation (min): 30 min    Short Term Goals: Week 1:  OT Short Term Goal 1 (Week 1): Pt will be able to sit to stand with mod A of 1 to prepare for toileting.  OT Short Term Goal 1 - Progress (Week 1): Met OT Short Term Goal 2 (Week 1): Pt will be able to transfer to toilet with mod A of 1. OT Short Term Goal 2 - Progress (Week 1): Met OT Short Term Goal 3 (Week 1): Pt will be able to use his LUE as a stabilizing A when donning a shirt. OT Short Term Goal 3 - Progress (Week 1): Met OT Short Term Goal 4 (Week 1): Pt will be able to maintain sitting balance with close S for at least 10 min to enable him to sit safely on a tub bench. OT Short Term Goal 4 - Progress (Week 1): Met  Skilled Therapeutic Interventions/Progress Updates:    Pt seen seated in w/c upon arrival. Pt agreable to tx and reporting no pain. Pt ambulates to/from all therapeutic destinations with CGA for balance with 1 LOB noted to L. Focus of session on [facility] and LUE strengthening with theraputty. Pt finds 13 beads in yello soft theraputty with increased time to locate and manipulate material. Pt given [facility] theraputty handout to improve digit flexion, extension, grasp release and pinch. Recommended pt complete once a day to improve hand strength/coordination. Exited session with pt seated in w/c in room with call light in place and QRB on.    Therapy Documentation Precautions:  Precautions Precautions: Cervical Required Braces or Orthoses: Cervical Brace Cervical Brace: Hard collar Restrictions Weight Bearing Restrictions: No Other Position/Activity Restrictions: Pt with very weak core Pain: Pain Assessment Pain Score: Asleep  See Function Navigator for Current Functional Status.   Therapy/Group: Individual Therapy  Tonny Branch 09/16/2016, 11:06 AM

## 2016-09-17 ENCOUNTER — Ambulatory Visit (HOSPITAL_COMMUNITY): Payer: Self-pay | Admitting: Physical Therapy

## 2016-09-17 NOTE — Progress Notes (Signed)
Patient reports, he's suppose to get Romero suppository every other morning, therefore refused AM supp. Patrick Romero

## 2016-09-17 NOTE — Progress Notes (Signed)
Subjective/Complaints: Still weaker on the right side compared to the left side, particularly in the upper limbs, still has some neck pain  ROS: pt denies nausea, vomiting, diarrhea, cough, shortness of breath or chest pain  Objective: Vital Signs: Blood pressure 105/67, pulse 75, temperature 98.8 F (37.1 C), temperature source Oral, resp. rate 18, weight 66.5 kg (146 lb 9.6 oz), SpO2 98 %. No results found. Results for orders placed or performed during the hospital encounter of 09/05/16 (from the past 72 hour(s))  Basic metabolic panel     Status: Abnormal   Collection Time: 09/15/16  5:59 AM  Result Value Ref Range   Sodium 134 (L) 135 - 145 mmol/L   Potassium 4.4 3.5 - 5.1 mmol/L   Chloride 96 (L) 101 - 111 mmol/L   CO2 31 22 - 32 mmol/L   Glucose, Bld 86 65 - 99 mg/dL   BUN 30 (H) 6 - 20 mg/dL   Creatinine, Ser 1.04 0.61 - 1.24 mg/dL   Calcium 9.7 8.9 - 10.3 mg/dL   GFR calc non Af Amer >60 >60 mL/min   GFR calc Af Amer >60 >60 mL/min    Comment: (NOTE) The eGFR has been calculated using the CKD EPI equation. This calculation has not been validated in all clinical situations. eGFR's persistently <60 mL/min signify possible Chronic Kidney Disease.    Anion gap 7 5 - 15     HEENT: Poor dentition. Normocephalic. Atraumatic.  Cardio: Reg rhythm, tachy. Neck: C-collar in place.  Resp: clear bilaterally GI: BS positive and Nontender Skin:   Intact. Warm and dry.  Neuro: Alert/Oriented Motor 3- to 3/5 left deltoid, biceps, 2+ to 3- triceps, wrist extension 3/5, hand grip 2 to 2+/5  3-/5 right deltoid muscle, biceps,2- triceps, wrist extension, hand grip tr to 1/5--  3/5 at the ankle dorsiflexor, plantar flexor, 4 minus. Hip flexion, knee extension ----motor exam stable -using tenodesis splint to assist RUE with ADL's Musc: No edema. No tenderness in extremities.  Gen.: NAD.    Assessment/Plan: 1. Functional deficits secondary to tetraplegia central cord syndrome which  require 3+ hours per day of interdisciplinary therapy in a comprehensive inpatient rehab setting. Physiatrist is providing close team supervision and 24 hour management of active medical problems listed below. Physiatrist and rehab team continue to assess barriers to discharge/monitor patient progress toward functional and medical goals. FIM: Function - Bathing Position: Shower Body parts bathed by patient: Right arm, Left arm, Chest, Abdomen, Front perineal area, Right upper leg, Left upper leg Body parts bathed by helper: Right lower leg, Left lower leg, Back, Buttocks Assist Level: 2 helpers  Function- Upper Body Dressing/Undressing What is the patient wearing?: Pull over shirt/dress Pull over shirt/dress - Perfomed by patient: Thread/unthread right sleeve, Pull shirt over trunk, Thread/unthread left sleeve Pull over shirt/dress - Perfomed by helper: Put head through opening Function - Lower Body Dressing/Undressing What is the patient wearing?: Shoes, Ted Hose Position: Wheelchair/chair at [employer]- Performed by patient: Thread/unthread right pants leg, Thread/unthread left pants leg Pants- Performed by helper: Pull pants up/down Non-skid slipper socks- Performed by helper: Don/doff right sock, Don/doff left sock Shoes - Performed by patient: Don/doff left shoe Shoes - Performed by helper: Don/doff right shoe, Fasten right, Fasten left TED Hose - Performed by helper: Don/doff right TED hose, Don/doff left TED hose Assist for footwear: Partial/moderate assist Assist for lower body dressing: Touching or steadying assistance (Pt > 75%)  Function - Toileting Toileting activity did not occur: No  continent bowel/bladder event Toileting steps completed by patient: Adjust clothing prior to toileting, Performs perineal hygiene, Adjust clothing after toileting Toileting steps completed by helper:  (assist with clothing and hygiene) Toileting Assistive Devices: Grab bar or rail Assist  level: Set up/obtain supplies, Touching or steadying assistance (Pt.75%)  Function - Toilet Transfers Toilet transfer assistive device: Elevated toilet seat/BSC over toilet Assist level to toilet: Touching or steadying assistance (Pt > 75%) Assist level from toilet: Touching or steadying assistance (Pt > 75%)  Function - Chair/bed transfer Chair/bed transfer method: Ambulatory Chair/bed transfer assist level: Touching or steadying assistance (Pt > 75%) Chair/bed transfer assistive device: Bedrails, Armrests Chair/bed transfer details: Verbal cues for technique, Tactile cues for weight shifting  Function - Locomotion: Wheelchair Type: Manual Max wheelchair distance: 150 Assist Level: Supervision or verbal cues Assist Level: Supervision or verbal cues Assist Level: Supervision or verbal cues Function - Locomotion: Ambulation Assistive device: No device Max distance: 175 Assist level: Touching or steadying assistance (Pt > 75%) Assist level: Touching or steadying assistance (Pt > 75%) Assist level: Touching or steadying assistance (Pt > 75%) Assist level: Touching or steadying assistance (Pt > 75%)  Function - Comprehension Comprehension: Auditory Comprehension assist level: Follows basic conversation/direction with no assist  Function - Expression Expression: Verbal Expression assist level: Expresses basic needs/ideas: With no assist  Function - Social Interaction Social Interaction assist level: Interacts appropriately 90% of the time - Needs monitoring or encouragement for participation or interaction.  Function - Problem Solving Problem solving assist level: Solves basic problems with no assist  Function - Memory Memory assist level: Recognizes or recalls 75 - 89% of the time/requires cueing 10 - 24% of the time Patient normally able to recall (first 3 days only): That he or she is in a hospital, Current season, Staff names and faces Medical Problem List and Plan: 1.   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  Continue CIR- PT, OT  -pt remains motivated 2.  DVT Prophylaxis/Anticoagulation: Pharmaceutical: Lovenox 3. Pain Management: Continue oxycodone prn for now.  Added ultram for moderate pain. Dysesthesias managed on current dose lyrica.  4. Mood: LCSW to follow for evaluation and support.  5. Neuropsych: This patient is capable of making decisions on his own behalf. 6. Skin/Wound Care:  Air mattress overlay. Pressure relief measures.  7. Fluids/Electrolytes/Nutrition:     -still a little dry----encourage fluids  -recheck bmet Monday     Component Value Date/Time   NA 134 (L) 09/15/2016 0559   K 4.4 09/15/2016 0559   CL 96 (L) 09/15/2016 0559   CO2 31 09/15/2016 0559   GLUCOSE 86 09/15/2016 0559   BUN 30 (H) 09/15/2016 0559   CREATININE 1.04 09/15/2016 0559   CALCIUM 9.7 09/15/2016 0559   GFRNONAA >60 09/15/2016 0559   GFRAA >60 09/15/2016 0559   8. History of presyncope/syncope: Monitor for now.Unclear whether that the orthostatic changes are separate issue,Patient has had no orthostatic drops since using the TED hose and abdominal binder 9. Anemia: question of chronic disease due to Alcohol use.Normocytic, hemoglobin mildly decreased.  Cont to monitor, Check stool guaiacs CBC    Component Value Date/Time   WBC 6.0 09/12/2016 0557   RBC 3.96 (L) 09/12/2016 0557   HGB 10.1 (L) 09/12/2016 0557   HCT 32.0 (L) 09/12/2016 0557   PLT 419 (H) 09/12/2016 0557   MCV 80.8 09/12/2016 0557   MCH 25.5 (L) 09/12/2016 0557   MCHC 31.6 09/12/2016 0557   RDW 12.7 09/12/2016 0557  LYMPHSABS 2.2 09/06/2016 0732   MONOABS 0.9 09/06/2016 0732   EOSABS 0.3 09/06/2016 0732   BASOSABS 0.0 09/06/2016 0732   10. Mid chest wall pain:  Resolved  likely musculoskeletal  11. Right renal cyst: Will need non-emergent follow up ultrasound for work up.  12. ETOH abuse/Tobacco: Counsel as appropriate  LOS (Days) 12 A FACE TO FACE  EVALUATION WAS PERFORMED  KIRSTEINS,ANDREW E 09/17/2016, 11:04 AM

## 2016-09-17 NOTE — Progress Notes (Signed)
Physical Therapy Session Note  Patient Details  Name: Patrick Romero MRN: [id number] Date of Birth: 1961/02/29  Today's Date: 09/17/2016 PT Concurrent Time: 1115-1200 PT Concurrent Time Calculation (min): 45 min and PT Individual Time: 1200-1210 PT Individual Time Calculation (min): 10 min   Short Term Goals: Week 2:  PT Short Term Goal 1 (Week 2): Pt will perform bed mobility with minA from flat bed PT Short Term Goal 2 (Week 2): Pt will perform transfers with consistent min guard PT Short Term Goal 3 (Week 2): Pt will ambulate x150' with platform RW and S PT Short Term Goal 4 (Week 2): Pt will perform ascent/descent 4 stairs 6" height with modA  Skilled Therapeutic Interventions/Progress Updates: Pt received seated in w/c, denies pain and agreeable to treatment. BUE dorsal wrist splint donned totalA. Gait to gym with no AD and minA for occasional staggering and poor RLE foot clearance. Seated nustep BUE/BLE for 9 min on level 5 for strengthening and aerobic endurance; use of RUE hand splint to maintain grip on handle. Seated LUE FMC performing pipe tree; requires occasional assist for stabilizing items to allow for LUE to push, and pt does initiate using RUE to assist as much as possible. Gait to return to room min guard. Remained seated in w/c with quick release belt intact and all needs in reach at end of session.      Therapy Documentation Precautions:  Precautions Precautions: Cervical Required Braces or Orthoses: Cervical Brace Cervical Brace: Hard collar Restrictions Weight Bearing Restrictions: No Other Position/Activity Restrictions: Pt with very weak core   See Function Navigator for Current Functional Status.   Therapy/Group: Concurrent  Luberta Mutter 09/17/2016, 11:50 AM

## 2016-09-18 ENCOUNTER — Inpatient Hospital Stay (HOSPITAL_COMMUNITY): Payer: Self-pay | Admitting: Occupational Therapy

## 2016-09-18 NOTE — Progress Notes (Signed)
Subjective/Complaints: Neck pain is okay today. Still notes the right-sided greater than left-sided weakness  ROS: pt denies nausea, vomiting, diarrhea, cough, shortness of breath or chest pain  Objective: Vital Signs: Blood pressure 99/66, pulse 75, temperature 98.6 F (37 C), temperature source Oral, resp. rate 18, weight 66.5 kg (146 lb 9.6 oz), SpO2 100 %. No results found. No results found for this or any previous visit (from the past 72 hour(s)).   HEENT: Poor dentition. Normocephalic. Atraumatic.  Cardio: Reg rhythm, tachy. Neck: C-collar in place.  Resp: clear bilaterally GI: BS positive and Nontender Skin:   Intact. Warm and dry.  Neuro: Alert/Oriented Motor 3- to 3/5 left deltoid, biceps, 2+ to 3- triceps, wrist extension 3/5, hand grip 2 to 2+/5  3-/5 right deltoid muscle, biceps,2- triceps, wrist extension, hand grip tr to 1/5--  3/5 at the ankle dorsiflexor, plantar flexor, 4 minus. Hip flexion, knee extension ----motor exam stable -using tenodesis splint to assist RUE with ADL's Musc: No edema. No tenderness in extremities.  Gen.: NAD.    Assessment/Plan: 1. 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 Physiatrist is providing close team supervision and 24 hour management of active medical problems listed below. Physiatrist and rehab team continue to assess barriers to discharge/monitor patient progress toward functional and medical goals. FIM: Function - Bathing Position: Shower Body parts bathed by patient: Right arm, Left arm, Chest, Abdomen, Front perineal area, Right upper leg, Left upper leg Body parts bathed by helper: Right lower leg, Left lower leg, Back, Buttocks Assist Level: 2 helpers  Function- Upper Body Dressing/Undressing What is the patient wearing?: Pull over shirt/dress Pull over shirt/dress - Perfomed by patient:  Thread/unthread right sleeve, Pull shirt over trunk, Thread/unthread left sleeve Pull over shirt/dress - Perfomed by helper: Put head through opening Function - Lower Body Dressing/Undressing What is the patient wearing?: Shoes, Ted Hose Position: Wheelchair/chair at [employer]- Performed by patient: Thread/unthread right pants leg, Thread/unthread left pants leg Pants- Performed by helper: Pull pants up/down Non-skid slipper socks- Performed by helper: Don/doff right sock, Don/doff left sock Shoes - Performed by patient: Don/doff left shoe Shoes - Performed by helper: Don/doff right shoe, Fasten right, Fasten left TED Hose - Performed by helper: Don/doff right TED hose, Don/doff left TED hose Assist for footwear: Partial/moderate assist Assist for lower body dressing: Touching or steadying assistance (Pt > 75%)  Function - Toileting Toileting activity did not occur: No continent bowel/bladder event Toileting steps completed by patient: Adjust clothing prior to toileting, Performs perineal hygiene, Adjust clothing after toileting Toileting steps completed by helper:  (assist with clothing and hygiene) Toileting Assistive Devices: Grab bar or rail Assist level: Touching or steadying assistance (Pt.75%)  Function - Air cabin crew transfer assistive device: Elevated toilet seat/BSC over toilet, Grab bar Assist level to toilet: Touching or steadying assistance (Pt > 75%) Assist level from toilet: Touching or steadying assistance (Pt > 75%)  Function - Chair/bed transfer Chair/bed transfer method: Ambulatory Chair/bed transfer assist level: Touching or steadying assistance (Pt > 75%) Chair/bed transfer assistive device: Bedrails, Armrests Chair/bed transfer details: Verbal cues for technique, Tactile cues for weight shifting  Function - Locomotion: Wheelchair Type: Manual Max wheelchair distance: 150 Assist Level: Supervision or verbal cues Assist Level: Supervision or verbal  cues Assist Level: Supervision or verbal cues Function - Locomotion: Ambulation Assistive device: No device Max distance: 150 Assist level: Touching or steadying  assistance (Pt > 75%) Assist level: Touching or steadying assistance (Pt > 75%) Assist level: Touching or steadying assistance (Pt > 75%) Assist level: Touching or steadying assistance (Pt > 75%)  Function - Comprehension Comprehension: Auditory Comprehension assist level: Follows basic conversation/direction with no assist  Function - Expression Expression: Verbal Expression assist level: Expresses basic needs/ideas: With no assist  Function - Social Interaction Social Interaction assist level: Interacts appropriately 90% of the time - Needs monitoring or encouragement for participation or interaction.  Function - Problem Solving Problem solving assist level: Solves complex 90% of the time/cues < 10% of the time  Function - Memory Memory assist level: Recognizes or recalls 90% of the time/requires cueing < 10% of the time Patient normally able to recall (first 3 days only): That he or she is in a hospital, Current season, Staff names and faces Medical Problem List and Plan: 1.  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  Continue CIR- PT, OT  -pt remains motivated 2.  DVT Prophylaxis/Anticoagulation: Pharmaceutical: Lovenox 3. Pain Management: Continue oxycodone prn for now.  Added ultram for moderate pain. Dysesthesias managed on current dose lyrica.  4. Mood: LCSW to follow for evaluation and support.  5. Neuropsych: This patient is capable of making decisions on his own behalf. 6. Skin/Wound Care:  Air mattress overlay. Pressure relief measures.  7. Fluids/Electrolytes/Nutrition:     -still a little dry----encourage fluids  -recheck bmet Monday     Component Value Date/Time   NA 134 (L) 09/15/2016 0559   K 4.4 09/15/2016 0559   CL 96 (L) 09/15/2016 0559   CO2 31 09/15/2016 0559   GLUCOSE  86 09/15/2016 0559   BUN 30 (H) 09/15/2016 0559   CREATININE 1.04 09/15/2016 0559   CALCIUM 9.7 09/15/2016 0559   GFRNONAA >60 09/15/2016 0559   GFRAA >60 09/15/2016 0559   8. History of presyncope/syncope: Monitor for now.Unclear whether that the orthostatic changes are separate issue,Patient has had no orthostatic drops since using the TED hose and abdominal binder 9. Anemia: question of chronic disease due to Alcohol use.Normocytic, hemoglobin mildly decreased.  Cont to monitor, Check stool guaiacs, still pending CBC    Component Value Date/Time   WBC 6.0 09/12/2016 0557   RBC 3.96 (L) 09/12/2016 0557   HGB 10.1 (L) 09/12/2016 0557   HCT 32.0 (L) 09/12/2016 0557   PLT 419 (H) 09/12/2016 0557   MCV 80.8 09/12/2016 0557   MCH 25.5 (L) 09/12/2016 0557   MCHC 31.6 09/12/2016 0557   RDW 12.7 09/12/2016 0557   LYMPHSABS 2.2 09/06/2016 0732   MONOABS 0.9 09/06/2016 0732   EOSABS 0.3 09/06/2016 0732   BASOSABS 0.0 09/06/2016 0732   10. Mid chest wall pain:  Resolved  likely musculoskeletal  11. Right renal cyst: Will need non-emergent follow up ultrasound for work up.  12. ETOH abuse/Tobacco: Counsel as appropriate  LOS (Days) 13 A FACE TO FACE EVALUATION WAS PERFORMED  Latana Colin E 09/18/2016, 9:57 AM

## 2016-09-18 NOTE — Progress Notes (Signed)
Occupational Therapy Session Note  Patient Details  Name: Patrick Romero MRN: [id number] Date of Birth: 03-02-1961  Today's Date: 09/18/2016 OT Individual Time: 1000-1030 OT Individual Time Calculation (min): 30 min    Short Term Goals: Week 2:  OT Short Term Goal 1 (Week 2): Pt will complete oral care in standing following set-up with AE with CGA OT Short Term Goal 2 (Week 2): Pt will don pants with mod A sit <> stand.  OT Short Term Goal 3 (Week 2): Pt will use L UE to thread R UE into shirt sleeve with set-up assist.  OT Short Term Goal 4 (Week 2): Pt will complete 1/3 toileting steps with steadying assist in order to decrease caregiver burden.   Skilled Therapeutic Interventions/Progress Updates: 1:1 Therapeutic activity focusing on functional ambulation without AD with AFO from room to gym with min A. Continued focus on maintaining upright trunk posture in dynamic standing balance activity. Performed stepping on kinetron in standing 4x for ~1 min each time. Used mirror for visual feedback on posture. Required min A for standing balance during activity. During ambulation back to room focus on holding on a ball engaging chest and UE activity with min A.  Left resting in bed.      Therapy Documentation Precautions:  Precautions Precautions: Cervical Required Braces or Orthoses: Cervical Brace Cervical Brace: Hard collar Restrictions Weight Bearing Restrictions: No Other Position/Activity Restrictions: Pt with very weak core Pain:  ongoing neck pain - made adjustments to collar for better fit  ADL: ADL ADL Comments: refer to functional navigator  See Function Navigator for Current Functional Status.   Therapy/Group: Individual Therapy  Willeen Cass [facility] 09/18/2016, 12:43 PM

## 2016-09-19 ENCOUNTER — Inpatient Hospital Stay (HOSPITAL_COMMUNITY): Payer: Medicaid Other | Admitting: Occupational Therapy

## 2016-09-19 ENCOUNTER — Inpatient Hospital Stay (HOSPITAL_COMMUNITY): Payer: Self-pay | Admitting: Occupational Therapy

## 2016-09-19 ENCOUNTER — Inpatient Hospital Stay (HOSPITAL_COMMUNITY): Payer: Self-pay | Admitting: Physical Therapy

## 2016-09-19 LAB — BASIC METABOLIC PANEL
ANION GAP: 7 (ref 5–15)
BUN: 25 mg/dL — ABNORMAL HIGH (ref 6–20)
CALCIUM: 9.5 mg/dL (ref 8.9–10.3)
CO2: 28 mmol/L (ref 22–32)
Chloride: 100 mmol/L — ABNORMAL LOW (ref 101–111)
Creatinine, Ser: 0.97 mg/dL (ref 0.61–1.24)
GLUCOSE: 92 mg/dL (ref 65–99)
POTASSIUM: 4.2 mmol/L (ref 3.5–5.1)
Sodium: 135 mmol/L (ref 135–145)

## 2016-09-19 NOTE — Progress Notes (Signed)
Occupational Therapy Session Note  Patient Details  Name: Patrick Romero MRN: [id number] Date of Birth: [birth year]  Today's Date: 09/19/2016 OT Individual Time: 8590-9311 OT Individual Time Calculation (min): 31 min   Skilled Therapeutic Interventions/Progress Updates: Skilled OT session completed with focus on B UE AROM, dynamic standing, and functional ambulation. Pt ambulated to dayroom with Min A, 1 small LOB while turning head to engage in conversation in hallway. Once in dayroom, he engaged in table washing with bilateral UEs, focusing more on right due to functional discrepancies. He completed task in seated and then ambulated around table to sit and wash another section. R UE supported by therapist when ambulating without sling. At end of tx he ambulated back to room. Ice applied to right shoulder. He was left with all needs within reach and safety belt donned at time of departure.       Therapy Documentation Precautions:  Precautions Precautions: Cervical Required Braces or Orthoses: Cervical Brace Cervical Brace: Hard collar Restrictions Weight Bearing Restrictions: No Other Position/Activity Restrictions: Pt with very weak core    Pain: Pain in R UE with activity, had him alternate hands during session to help with this. Ice applied to right shoulder at end of session    ADL: ADL ADL Comments: refer to functional navigator    See Function Navigator for Current Functional Status.   Therapy/Group: Individual Therapy  Lilianne Delair A Terryl Molinelli 09/19/2016, 8:05 PM

## 2016-09-19 NOTE — Progress Notes (Signed)
Physical Therapy Session Note  Patient Details  Name: Patrick Romero MRN: [id number] Date of Birth: 11/23/1960  Today's Date: 09/19/2016 PT Individual Time: 1000-1100 PT Individual Time Calculation (min): 60 min   Short Term Goals: Week 2:  PT Short Term Goal 1 (Week 2): Pt will perform bed mobility with minA from flat bed PT Short Term Goal 2 (Week 2): Pt will perform transfers with consistent min guard PT Short Term Goal 3 (Week 2): Pt will ambulate x150' with platform RW and S PT Short Term Goal 4 (Week 2): Pt will perform ascent/descent 4 stairs 6" height with modA  Skilled Therapeutic Interventions/Progress Updates: Pt received seated in w/c, denies pain and agreeable to treatment. Gait to gym with RLE AFO, minA> min guard with occasional LOB due to RLE foot clearance and hypertonicity. Sit>supine with S. PROM to RLE hamstrings, hip ER, hip flexion. Demonstrated long sitting stretch for hamstring, hip ER, gastroc/soleus with gait belt d/t difficulty holding towel. Educated pt on frequency/duration of stretches for performance outside of regular therapy sessions to A with tone and gait; provided handout for written instructions. Gait following stretching with min guard and improved foot clearance. Nustep BUE/BLE level 4 with average 45 steps/min for strengthening and coordination. Stairs ascent/descent 1 handrail and min guard>close S x12 stairs, 6" height. Gait to return to room with min guard. Remained seated in w/c with all needs in reach at end of session.      Therapy Documentation Precautions:  Precautions Precautions: Cervical Required Braces or Orthoses: Cervical Brace Cervical Brace: Hard collar Restrictions Weight Bearing Restrictions: No Other Position/Activity Restrictions: Pt with very weak core   See Function Navigator for Current Functional Status.   Therapy/Group: Individual Therapy  Luberta Mutter 09/19/2016, 10:53 AM

## 2016-09-19 NOTE — Progress Notes (Signed)
Subjective/Complaints: Denies pain. Feels that his right arm got stronger over weekend. Feeding himself with left hand/adaptive utensil  ROS: pt denies nausea, vomiting, diarrhea, cough, shortness of breath or chest pain  Objective: Vital Signs: Blood pressure 97/67, pulse 79, temperature 98.8 F (37.1 C), temperature source Oral, resp. rate 18, weight 66.5 kg (146 lb 9.6 oz), SpO2 100 %. No results found. Results for orders placed or performed during the hospital encounter of 09/05/16 (from the past 72 hour(s))  Basic metabolic panel     Status: Abnormal   Collection Time: 09/19/16  5:55 AM  Result Value Ref Range   Sodium 135 135 - 145 mmol/L   Potassium 4.2 3.5 - 5.1 mmol/L   Chloride 100 (L) 101 - 111 mmol/L   CO2 28 22 - 32 mmol/L   Glucose, Bld 92 65 - 99 mg/dL   BUN 25 (H) 6 - 20 mg/dL   Creatinine, Ser 0.97 0.61 - 1.24 mg/dL   Calcium 9.5 8.9 - 10.3 mg/dL   GFR calc non Af Amer >60 >60 mL/min   GFR calc Af Amer >60 >60 mL/min    Comment: (NOTE) The eGFR has been calculated using the CKD EPI equation. This calculation has not been validated in all clinical situations. eGFR's persistently <60 mL/min signify possible Chronic Kidney Disease.    Anion gap 7 5 - 15     HEENT: Poor dentition. Normocephalic. Atraumatic.  Cardio: RRR Neck: C-collar in place. fitting Resp: CTA B GI: BS NT Skin:   Intact. Warm and dry.  Neuro: Alert/Oriented Motor 3- to 3/5 left deltoid, biceps, 2+ to 3- triceps, wrist extension 3/5, hand grip 2+ to 3-/5  3-/5 right deltoid muscle, biceps,2- triceps, wrist extension, hand grip 1+ to 2-/5--  3/5 at the ankle dorsiflexor, plantar flexor, 4 minus. Hip flexion, knee extension ----motor exam stable -using tenodesis splint to assist RUE with ADL's Musc: No edema. No tenderness in extremities.  Gen.: NAD.    Assessment/Plan: 1. Functional deficits secondary to tetraplegia central cord syndrome which require 3+ hours per day of  interdisciplinary therapy in a comprehensive inpatient rehab setting. Physiatrist is providing close team supervision and 24 hour management of active medical problems listed below. Physiatrist and rehab team continue to assess barriers to discharge/monitor patient progress toward functional and medical goals. FIM: Function - Bathing Position: Shower Body parts bathed by patient: Right arm, Left arm, Chest, Abdomen, Front perineal area Body parts bathed by helper: Buttocks, Right upper leg, Left upper leg, Right lower leg, Left lower leg, Back Assist Level: 2 helpers  Function- Upper Body Dressing/Undressing What is the patient wearing?: Pull over shirt/dress Pull over shirt/dress - Perfomed by patient: Thread/unthread right sleeve, Thread/unthread left sleeve, Pull shirt over trunk Pull over shirt/dress - Perfomed by helper: Put head through opening Function - Lower Body Dressing/Undressing What is the patient wearing?: Shoes, Ted Hose Position: Sitting EOB Pants- Performed by patient: Thread/unthread right pants leg, Thread/unthread left pants leg Pants- Performed by helper: Pull pants up/down Non-skid slipper socks- Performed by helper: Don/doff right sock, Don/doff left sock Shoes - Performed by patient: Don/doff left shoe Shoes - Performed by helper: Don/doff right shoe, Fasten right, Fasten left TED Hose - Performed by helper: Don/doff right TED hose, Don/doff left TED hose Assist for footwear: Partial/moderate assist Assist for lower body dressing: Touching or steadying assistance (Pt > 75%)  Function - Toileting Toileting activity did not occur: No continent bowel/bladder event Toileting steps completed by patient: Performs perineal  hygiene Toileting steps completed by helper: Adjust clothing prior to toileting, Adjust clothing after toileting Toileting Assistive Devices: Grab bar or rail Assist level: Touching or steadying assistance (Pt.75%)  Function - Toilet  Transfers Toilet transfer assistive device: Elevated toilet seat/BSC over toilet, Grab bar Assist level to toilet: Touching or steadying assistance (Pt > 75%) Assist level from toilet: Touching or steadying assistance (Pt > 75%)  Function - Chair/bed transfer Chair/bed transfer method: Ambulatory Chair/bed transfer assist level: Touching or steadying assistance (Pt > 75%) Chair/bed transfer assistive device: Bedrails, Armrests Chair/bed transfer details: Verbal cues for technique, Tactile cues for weight shifting  Function - Locomotion: Wheelchair Type: Manual Max wheelchair distance: 150 Assist Level: Supervision or verbal cues Assist Level: Supervision or verbal cues Assist Level: Supervision or verbal cues Function - Locomotion: Ambulation Assistive device: No device Max distance: 150 Assist level: Touching or steadying assistance (Pt > 75%) Assist level: Touching or steadying assistance (Pt > 75%) Assist level: Touching or steadying assistance (Pt > 75%) Assist level: Touching or steadying assistance (Pt > 75%)  Function - Comprehension Comprehension: Auditory Comprehension assist level: Follows complex conversation/direction with no assist  Function - Expression Expression: Verbal Expression assist level: Expresses complex ideas: With no assist  Function - Social Interaction Social Interaction assist level: Interacts appropriately with others - No medications needed.  Function - Problem Solving Problem solving assist level: Solves complex problems: Recognizes & self-corrects  Function - Memory Memory assist level: Complete Independence: No helper Patient normally able to recall (first 3 days only): That he or she is in a hospital, Current season, Staff names and faces Medical Problem List and Plan: 1.  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  Continue CIR- PT, OT  -making gradual neurological gains 2.  DVT Prophylaxis/Anticoagulation:  Pharmaceutical: Lovenox 3. Pain Management: Continue oxycodone prn for now.  Added ultram for moderate pain. Dysesthesias managed on current dose lyrica.  4. Mood: LCSW to follow for evaluation and support.  5. Neuropsych: This patient is capable of making decisions on his own behalf. 6. Skin/Wound Care:  Air mattress overlay. Pressure relief measures.  7. Fluids/Electrolytes/Nutrition:     -BUN slightly improved---continue to encourage fluids        Component Value Date/Time   NA 135 09/19/2016 0555   K 4.2 09/19/2016 0555   CL 100 (L) 09/19/2016 0555   CO2 28 09/19/2016 0555   GLUCOSE 92 09/19/2016 0555   BUN 25 (H) 09/19/2016 0555   CREATININE 0.97 09/19/2016 0555   CALCIUM 9.5 09/19/2016 0555   GFRNONAA >60 09/19/2016 0555   GFRAA >60 09/19/2016 0555   8. History of presyncope/syncope: Monitor for now.Unclear whether that the orthostatic changes are separate issue,Patient has had no orthostatic drops since using the TED hose and abdominal binder 9. Anemia: question of chronic disease due to Alcohol use.Normocytic, hemoglobin mildly decreased.  Cont to monitor     Component Value Date/Time   WBC 6.0 09/12/2016 0557   RBC 3.96 (L) 09/12/2016 0557   HGB 10.1 (L) 09/12/2016 0557   HCT 32.0 (L) 09/12/2016 0557   PLT 419 (H) 09/12/2016 0557   MCV 80.8 09/12/2016 0557   MCH 25.5 (L) 09/12/2016 0557   MCHC 31.6 09/12/2016 0557   RDW 12.7 09/12/2016 0557   LYMPHSABS 2.2 09/06/2016 0732   MONOABS 0.9 09/06/2016 0732   EOSABS 0.3 09/06/2016 0732   BASOSABS 0.0 09/06/2016 0732   10. Mid chest wall pain:  Resolved  likely musculoskeletal  11. Right  renal cyst: Will need non-emergent follow up ultrasound for work up.  12. ETOH abuse/Tobacco: Counsel as appropriate  LOS (Days) 14 A FACE TO FACE EVALUATION WAS PERFORMED  SWARTZ,ZACHARY T 09/19/2016, 9:12 AM

## 2016-09-19 NOTE — Progress Notes (Signed)
Occupational Therapy Session Note  Patient Details  Name: Patrick Romero MRN: [id number] Date of Birth: 09-01-1960  Today's Date: 09/19/2016 OT Individual Time: 8333-8329 and 1330-1400 OT Individual Time Calculation (min): 75 min and 30 min   Short Term Goals: Week 2:  OT Short Term Goal 1 (Week 2): Pt will complete oral care in standing following set-up with AE with CGA OT Short Term Goal 2 (Week 2): Pt will don pants with mod A sit <> stand.  OT Short Term Goal 3 (Week 2): Pt will use L UE to thread R UE into shirt sleeve with set-up assist.  OT Short Term Goal 4 (Week 2): Pt will complete 1/3 toileting steps with steadying assist in order to decrease caregiver burden.   Skilled Therapeutic Interventions/Progress Updates:    Session One: Pt seen for OT ADL bathing/dressing session. Pt sitting up in w/c upon arrival and agreeable to tx session including showering task. He ambulated with min A to dresser to obtain clothing items, VCs for safety awareness/ technique.  He bathed seated on tub bench, able to grasp regular washcloth with  L UE, an imiprovement from previous sessions using bath mit.  He dressed seated on tub bench, and then returned to bed for cervical brace pads to be changed. Pt able to recall care/ cleaning techniques for cervical brace pads. Seated EOB, pt dressed UB , using L UE to assist in threading R UE into sleeve. He was able to direct caregiver with proper technique to don R sling.  Grooming completed standing at sink with set-up assist, able to complete without use of AD- initially required steadying assist, progressed to supervision.  He ambulated with min A to therapy gym. Reapplied kinesiotape to R shoulder to assist with shoulder support/ prevention of subluxation. Completed scapular mobilization exercises- pt tolerated well. HE ambulated back to room in same manner as described above and left seated in w/c with all needs in reach.   Session Two: Pt seen for OT  session focusing on hand writting with adaptive techniques. Pt sitting up in w/c upon arrival, agreeable to tx session. He ambulated to day room with min A. Practiced hand witting from seated position using various writing utensils of various size pens. Pt displayed ability to legibally write first and last name with L UE. VCs for proper positioning for proximal-distal arm stabilization for increased control. Pt ambulated back to room at end of session, desired to stay in w/c until next session, left with all needs in reach. Pt returned hands free phone device as pt now with dexterity and control to use standard room telephone!  Therapy Documentation Precautions:  Precautions Precautions: Cervical Required Braces or Orthoses: Cervical Brace Cervical Brace: Hard collar Restrictions Weight Bearing Restrictions: No Other Position/Activity Restrictions: Pt with very weak core ADL: ADL ADL Comments: refer to functional navigator  See Function Navigator for Current Functional Status.   Therapy/Group: Individual Therapy  [name], Christepher Melchior C 09/19/2016, 7:10 AM

## 2016-09-20 ENCOUNTER — Inpatient Hospital Stay (HOSPITAL_COMMUNITY): Payer: Self-pay | Admitting: Occupational Therapy

## 2016-09-20 ENCOUNTER — Inpatient Hospital Stay (HOSPITAL_COMMUNITY): Payer: Self-pay | Admitting: Physical Therapy

## 2016-09-20 NOTE — Progress Notes (Signed)
Occupational Therapy Session Note  Patient Details  Name: Patrick Romero MRN: [id number] Date of Birth: January 30, 1961  Today's Date: 09/20/2016 OT Individual Time: 6381-7711 OT Individual Time Calculation (min): 60 min    Short Term Goals: Week 2:  OT Short Term Goal 1 (Week 2): Pt will complete oral care in standing following set-up with AE with CGA OT Short Term Goal 2 (Week 2): Pt will don pants with mod A sit <> stand.  OT Short Term Goal 3 (Week 2): Pt will use L UE to thread R UE into shirt sleeve with set-up assist.  OT Short Term Goal 4 (Week 2): Pt will complete 1/3 toileting steps with steadying assist in order to decrease caregiver burden.   Skilled Therapeutic Interventions/Progress Updates:    Pt seen for OT session focusing on functional ambulation and fine motor/ UE use. Pt sitting up in w/c upon arrival, agreeable to tx session. He ambulated throughout session with close supervision, increased clearance of R LE noted. In therapy gym, pt doffed shoes, and with L UE pulled shoelaces out. Elastic shoelaces placed for increased independence with dressing task.  Seated EOM, pt completed fine motor task focusing on in-hand manipulation of small animal figurines. Completed on L UE with increased time and [facility] for control. He returned to supine on mat and completed UE movements against gravity focusing on essentric and descentric control of limb. Completed bicep curls and internal/external rotation at elbow. He ambulated back to room at end of session. Completed grooming tasks standing at sink, practiced opening and closing containers of various sizes, R UE used at stabilizer level. Pt returned to w/c, left seated with all needs in reach.   Therapy Documentation Precautions:  Precautions Precautions: Cervical Required Braces or Orthoses: Cervical Brace Cervical Brace: Hard collar Restrictions Weight Bearing Restrictions: No Other Position/Activity Restrictions: Pt with very weak  core Pain: Pain Assessment Pain Assessment: No/denies pain ADL: ADL ADL Comments: refer to functional navigator  See Function Navigator for Current Functional Status.   Therapy/Group: Individual Therapy  [name], Samel Bruna C 09/20/2016, 12:42 PM

## 2016-09-20 NOTE — Progress Notes (Signed)
Physical Therapy Session Note  Patient Details  Name: Patrick Romero MRN: [id number] Date of Birth: 05-Jul-1961  Today's Date: 09/20/2016 PT Individual Time: 0800-0900 PT Individual Time Calculation (min): 60 min   Short Term Goals: Week 2:  PT Short Term Goal 1 (Week 2): Pt will perform bed mobility with minA from flat bed PT Short Term Goal 1 - Progress (Week 2): Met PT Short Term Goal 2 (Week 2): Pt will perform transfers with consistent min guard PT Short Term Goal 2 - Progress (Week 2): Met PT Short Term Goal 3 (Week 2): Pt will ambulate x150' with platform RW and S PT Short Term Goal 3 - Progress (Week 2): Met PT Short Term Goal 4 (Week 2): Pt will perform ascent/descent 4 stairs 6" height with modA PT Short Term Goal 4 - Progress (Week 2): Met  Skilled Therapeutic Interventions/Progress Updates:  Pt received seated in w/c, denies pain and agreeable to treatment. Pt dons shoes with S, requires assist for tying shoes. Gait to gym with min guard, no AD. Stairs 1x12 on 6" height with L handrail. RUE fine motor control activities reaching for various sized objects, modA for controlling shoulder and assisting with UE coordination. Supine RUE stretching to bicep/pec, shoulder IR/ER 2x1 min each. Shoulder/elbow control in supine with minA; pt able to extend elbow against gravity with shoulder flexed to 90 degrees, unable to maintain shoulder in neutral 90 degree flexion. Seated scapular mobilizations superior/inferior with pt reporting pain relief. Returned to room with gait as above. Remained seated in w/c at end of session, all needs in reach.      Therapy Documentation Precautions:  Precautions Precautions: Cervical Required Braces or Orthoses: Cervical Brace Cervical Brace: Hard collar Restrictions Weight Bearing Restrictions: No Other Position/Activity Restrictions: Pt with very weak core Pain: Pain Assessment Pain Assessment: No/denies pain   See Function Navigator for  Current Functional Status.   Therapy/Group: Individual Therapy  Luberta Mutter 09/20/2016, 12:43 PM

## 2016-09-20 NOTE — Progress Notes (Signed)
Physical Therapy Note  Patient Details  Name: GERELL FORTSON MRN: [id number] Date of Birth: 10/07/1960 Today's Date: 09/20/2016    Time: 1500-1550 50 minutes  1:1 No c/o pain. Pt performed bed mobility and transfers with supervision. Supervision for w/c mobility 150' with bilat LEs.  PROM/stretching in supine for LEs for hips, hamstrings, calves.  Pt able to relax and improve PROM with stretching.  Kinetron in seated for LE strengthening 5 x 1 minute with 1 minute rest breaks in between.  Gait without AD throughout unit up to 200' with min A for LOB when startled by door opening.  Pt left in restroom at end of session, missed final 10 minutes due to toileting.   Coral Timme 09/20/2016, 4:04 PM

## 2016-09-20 NOTE — Progress Notes (Signed)
Physical Therapy Session Note  Patient Details  Name: Patrick Romero MRN: [id number] Date of Birth: 09-26-1960  Today's Date: 09/20/2016 PT Individual Time: 3212-2482 PT Individual Time Calculation (min): 61 min   Skilled Therapeutic Interventions/Progress Updates:    Pt initially denies pain, however, post therapy pt reports that prior to therapy his pain was 8/10.  Nursing present to issue pain meds after session.  Session initiated with pt walking to gym.  Pt noted to have increased problems clearing R foot (likely due to increased R LE tone as pt demonstrates clonus in R ankle today.  Applied ace wrap to right ankle which improve foot clearance.  Utilized external visual cues such as cones or stripes on floor to work on increasing width of base of support during ambulation and step length.  Additionally worked on toe taps to work on breaking up R LE tone during swing through in gait.  Pt was able show improved concomitant R hip flexion and knee flexion with flexing knee to lap table in standing.  This transferred to gait, following activity, pt shows improved swing thru of R LE during ambulation.  Pt performed nu-step x 10 min on L6 and sit to stand x 10 reps from recliner.  After tenth rep, pt reported dizziness.  Vitals as follows:  Bp:  98/65; Sp02:  99%; and Hr: 122 bpm;  Dizziness resolved with seated rest.  Pt returned to bed and left in care of nursing with 3 rails up and bed alarm turned on.  Call bell and phone in reach.  Therapy Documentation Precautions:  Precautions Precautions: Cervical Required Braces or Orthoses: Cervical Brace Cervical Brace: Hard collar Restrictions Weight Bearing Restrictions: No Other Position/Activity Restrictions: Pt with very weak core   See Function Navigator for Current Functional Status.   Therapy/Group: Individual Therapy  Ella Golomb Hilario Quarry 09/20/2016, 3:10 PM

## 2016-09-20 NOTE — Plan of Care (Signed)
Problem: RH Eating Goal: LTG Patient will perform eating w/assist, cues/equip (OT) LTG: Patient will perform eating with assist, with/without cues using equipment (OT)  Goal upgraded due to pt progress.- AL

## 2016-09-20 NOTE — Patient Care Conference (Signed)
Inpatient RehabilitationTeam Conference and Plan of Care Update Date: 09/20/2016   Time: 2:10 PM    Patient Name: Patrick Romero      Medical Record Number: [id number]  Date of Birth: [birth year] Sex: Male         Room/Bed: 4W10C/4W10C-01 Payor Info: Payor: MEDICAID POTENTIAL / Plan: MEDICAID POTENTIAL / Product Type: *No Product type* /    Admitting Diagnosis: fall with incomplete SCI  Admit Date/Time:  09/05/2016  4:02 PM Admission Comments: No comment available   Primary Diagnosis:  Cervical myelopathy with cervical radiculopathy Principal Problem: Cervical myelopathy with cervical radiculopathy  Patient Active Problem List   Diagnosis Date Noted  . Acute blood loss anemia   . Dysesthesia   . History of syncope   . Cervical myelopathy with cervical radiculopathy 09/05/2016  . Tetraparesis ([place])   . Central cord syndrome ([place])   . Anemia   . Renal cyst   . ETOH abuse   . Chest wall pain   . Neuropathic pain   . Neck pain   . Chronic neck and back pain 09/01/2016  . Alcohol intoxication ([place]) 09/01/2016  . Tobacco abuse 09/01/2016  . Paraplegia following spinal cord injury ([place]) 09/01/2016    Expected Discharge Date: Expected Discharge Date: 09/30/16  Team Members Present: Physician leading conference: Dr. Alger Simons Social Worker Present: Lennart Pall, LCSW Nurse Present: Heather Roberts, RN PT Present: Canary Brim, Harriet Pho, PT OT Present: Napoleon Form, OT SLP Present: Gunnar Fusi, SLP     Current Status/Progress Goal Weekly Team Focus  Medical   gradually improving neurologically, pain controlled.   increase functional mobilty and independence  nutrition, pain control, orthotics.    Bowel/Bladder   Pt continet of bowel and bladder. daily bowel program q6am. BM on toilet. Pt uses urinal with assistance  manage bowel and bladder with mod assist  encourage use of self urinal and contiune education on b.b   Swallow/Nutrition/ Hydration              ADL's   Mod A bathing/dressing; supervision-min A functional transfers; set-up grooming/ self-feeding  min A self care, S transfers  UE functional use; standing balance/ endurance; ADL re-training; family ed    Mobility   S bed mobility, min guard/minA dynamic standing balance and gait, limited by RLE tone/spasticity  S overall including ambulatory in home  BLE NMR, dynamic standing balance, higher level dynamic balance, LE ROM for tone management   Communication             Safety/Cognition/ Behavioral Observations            Pain   pain to neck and arms. PRN 5mg  oxycodone. Kpad in use.  keep pain less than or equal to 3  assess pain and medicate as needed   Skin   skin CDI          Rehab Goals Patient on target to meet rehab goals: Yes *See Care Plan and progress notes for long and short-term goals.  Barriers to Discharge: RUE weakness    Possible Resolutions to Barriers:  ongoing adaptive equipment and techniques, caregiver education    Discharge Planning/Teaching Needs:  Pt to d/c to mother's home which is more accessible.  Family and girlfriend able to provide 24/7 assist.  Need to schedule teaching this week.   Team Discussion:  Progressing well;  Making excellent gains.  May upgrade goals next week.  Min/guard with ambulation and may not recommend any mobility aid at  d/c.  Concern over possible sublux right shoulder - MD aware.  Recommend earlier d/c date.  Revisions to Treatment Plan:  May be able to upgrade some goals next week.  Change in d/c date to 3/23.   Continued Need for Acute Rehabilitation Level of Care: The patient requires daily medical management by a physician with specialized training in physical medicine and rehabilitation for the following conditions: Daily direction of a multidisciplinary physical rehabilitation program to ensure safe treatment while eliciting the highest outcome that is of practical value to the patient.: Yes Daily medical management  of patient stability for increased activity during participation in an intensive rehabilitation regime.: Yes Daily analysis of laboratory values and/or radiology reports with any subsequent need for medication adjustment of medical intervention for : Neurological problems;Mood/behavior problems  Axil Copeman 09/20/2016, 4:50 PM

## 2016-09-20 NOTE — Progress Notes (Signed)
Subjective/Complaints: No new complaints. Brushing teeth. Denies pain  ROS: pt denies nausea, vomiting, diarrhea, cough, shortness of breath or chest pain  Objective: Vital Signs: Blood pressure (!) 90/52, pulse 71, temperature 97.9 F (36.6 C), temperature source Oral, resp. rate 18, weight 66.5 kg (146 lb 9.6 oz), SpO2 100 %. No results found. Results for orders placed or performed during the hospital encounter of 09/05/16 (from the past 72 hour(s))  Basic metabolic panel     Status: Abnormal   Collection Time: 09/19/16  5:55 AM  Result Value Ref Range   Sodium 135 135 - 145 mmol/L   Potassium 4.2 3.5 - 5.1 mmol/L   Chloride 100 (L) 101 - 111 mmol/L   CO2 28 22 - 32 mmol/L   Glucose, Bld 92 65 - 99 mg/dL   BUN 25 (H) 6 - 20 mg/dL   Creatinine, Ser 0.97 0.61 - 1.24 mg/dL   Calcium 9.5 8.9 - 10.3 mg/dL   GFR calc non Af Amer >60 >60 mL/min   GFR calc Af Amer >60 >60 mL/min    Comment: (NOTE) The eGFR has been calculated using the CKD EPI equation. This calculation has not been validated in all clinical situations. eGFR's persistently <60 mL/min signify possible Chronic Kidney Disease.    Anion gap 7 5 - 15     HEENT: Poor dentition. Normocephalic. Atraumatic.  Cardio: RRR Neck: C-collar in place. fitting Resp: CTA B GI: BS NT Skin:   Intact. Warm and dry.  Neuro: Alert/Oriented Motor 3- to 3/5 left deltoid, biceps, 2+ to 3- triceps, wrist extension 3/5, hand grip 2+ to 3-/5  3-/5 right deltoid muscle, biceps,2- triceps, wrist extension, hand grip 1+ to 2-/5--  3/5 at the ankle dorsiflexor, plantar flexor, 4 minus. Hip flexion, knee extension ----stable -using tenodesis splint to assist RUE with ADL's Musc: No edema. No tenderness in extremities.  Gen.: NAD.    Assessment/Plan: 1. 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 Physiatrist is  providing close team supervision and 24 hour management of active medical problems listed below. Physiatrist and rehab team continue to assess barriers to discharge/monitor patient progress toward functional and medical goals. FIM: Function - Bathing Position: Shower Body parts bathed by patient: Right arm, Left arm, Chest, Abdomen, Front perineal area Body parts bathed by helper: Buttocks, Right upper leg, Left upper leg, Right lower leg, Left lower leg, Back Assist Level: 2 helpers  Function- Upper Body Dressing/Undressing What is the patient wearing?: Pull over shirt/dress Pull over shirt/dress - Perfomed by patient: Thread/unthread right sleeve, Thread/unthread left sleeve, Pull shirt over trunk Pull over shirt/dress - Perfomed by helper: Put head through opening Function - Lower Body Dressing/Undressing What is the patient wearing?: Shoes, Ted Hose Position: Sitting EOB Pants- Performed by patient: Thread/unthread right pants leg, Thread/unthread left pants leg Pants- Performed by helper: Pull pants up/down Non-skid slipper socks- Performed by helper: Don/doff right sock, Don/doff left sock Shoes - Performed by patient: Don/doff right shoe, Don/doff left shoe Shoes - Performed by helper: Fasten right, Fasten left TED Hose - Performed by helper: Don/doff right TED hose, Don/doff left TED hose Assist for footwear: Partial/moderate assist Assist for lower body dressing: Touching or steadying assistance (Pt > 75%)  Function - Toileting Toileting activity did not occur: No continent bowel/bladder event Toileting steps completed by patient: Performs perineal hygiene Toileting steps completed by helper: Adjust clothing prior to toileting,  Performs perineal hygiene, Adjust clothing after toileting Toileting Assistive Devices: Grab bar or rail Assist level: Touching or steadying assistance (Pt.75%)  Function - Toilet Transfers Toilet transfer assistive device: Elevated toilet seat/BSC  over toilet, Grab bar Assist level to toilet: Touching or steadying assistance (Pt > 75%) Assist level from toilet: Touching or steadying assistance (Pt > 75%)  Function - Chair/bed transfer Chair/bed transfer method: Ambulatory Chair/bed transfer assist level: Supervision or verbal cues Chair/bed transfer assistive device: Armrests Chair/bed transfer details: Verbal cues for technique, Verbal cues for precautions/safety  Function - Locomotion: Wheelchair Type: Manual Max wheelchair distance: 150 Assist Level: Supervision or verbal cues Assist Level: Supervision or verbal cues Assist Level: Supervision or verbal cues Function - Locomotion: Ambulation Assistive device: No device Max distance: 150 Assist level: Touching or steadying assistance (Pt > 75%) Assist level: Touching or steadying assistance (Pt > 75%) Assist level: Touching or steadying assistance (Pt > 75%) Assist level: Touching or steadying assistance (Pt > 75%)  Function - Comprehension Comprehension: Auditory Comprehension assist level: Follows complex conversation/direction with no assist  Function - Expression Expression: Verbal Expression assist level: Expresses complex ideas: With no assist  Function - Social Interaction Social Interaction assist level: Interacts appropriately with others - No medications needed.  Function - Problem Solving Problem solving assist level: Solves complex problems: Recognizes & self-corrects  Function - Memory Memory assist level: Complete Independence: No helper Patient normally able to recall (first 3 days only): That he or she is in a hospital, Current season, Staff names and faces Medical Problem List and Plan: 1.  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  Continue CIR- PT, OT  -team conference today 2.  DVT Prophylaxis/Anticoagulation: Pharmaceutical: Lovenox 3. Pain Management: Continue oxycodone prn for now.  Added ultram for moderate pain.  Dysesthesias managed on current dose lyrica.  4. Mood: LCSW to follow for evaluation and support.  5. Neuropsych: This patient is capable of making decisions on his own behalf. 6. Skin/Wound Care:  Air mattress overlay. Pressure relief measures.  7. Fluids/Electrolytes/Nutrition:     -BUN slightly improved---continue to encourage fluids        Component Value Date/Time   NA 135 09/19/2016 0555   K 4.2 09/19/2016 0555   CL 100 (L) 09/19/2016 0555   CO2 28 09/19/2016 0555   GLUCOSE 92 09/19/2016 0555   BUN 25 (H) 09/19/2016 0555   CREATININE 0.97 09/19/2016 0555   CALCIUM 9.5 09/19/2016 0555   GFRNONAA >60 09/19/2016 0555   GFRAA >60 09/19/2016 0555   8. History of presyncope/syncope: Monitor for now.Unclear whether that the orthostatic changes are separate issue,Patient has had no orthostatic drops since using the TED hose and abdominal binder 9. Anemia: question of chronic disease due to Alcohol use.Normocytic, hemoglobin mildly decreased.  Cont to monitor     Component Value Date/Time   WBC 6.0 09/12/2016 0557   RBC 3.96 (L) 09/12/2016 0557   HGB 10.1 (L) 09/12/2016 0557   HCT 32.0 (L) 09/12/2016 0557   PLT 419 (H) 09/12/2016 0557   MCV 80.8 09/12/2016 0557   MCH 25.5 (L) 09/12/2016 0557   MCHC 31.6 09/12/2016 0557   RDW 12.7 09/12/2016 0557   LYMPHSABS 2.2 09/06/2016 0732   MONOABS 0.9 09/06/2016 0732   EOSABS 0.3 09/06/2016 0732   BASOSABS 0.0 09/06/2016 0732   10. Mid chest wall pain:  Resolved  likely musculoskeletal  11. Right renal cyst: Will need non-emergent follow up ultrasound for work up.  12. ETOH  abuse/Tobacco: Counsel as appropriate  LOS (Days) 15 A FACE TO FACE EVALUATION WAS PERFORMED  Harlyn Rathmann T 09/20/2016, 9:44 AM

## 2016-09-21 ENCOUNTER — Inpatient Hospital Stay (HOSPITAL_COMMUNITY): Payer: Medicaid Other | Admitting: Occupational Therapy

## 2016-09-21 ENCOUNTER — Inpatient Hospital Stay (HOSPITAL_COMMUNITY): Payer: Self-pay | Admitting: Physical Therapy

## 2016-09-21 ENCOUNTER — Inpatient Hospital Stay (HOSPITAL_COMMUNITY): Payer: Self-pay

## 2016-09-21 NOTE — Progress Notes (Signed)
Occupational Therapy Session Note  Patient Details  Name: Patrick Romero MRN: [id number] Date of Birth: [birth year]  Today's Date: 09/21/2016 OT Individual Time: 1500-1530 OT Individual Time Calculation (min): 30 min    Short Term Goals: Week 3:  OT Short Term Goal 1 (Week 3): STG=LTG due to LOS  Skilled Therapeutic Interventions/Progress Updates:    Upon entering the room, pt seated in wheelchair awaiting therapist. Pt with c/o fatigue this session but agreeable to OT intervention. Pt performs sit>stand with supervision. Supervision for ambulation 44' with use of device to mat in day room. Pt required assistance to remove R UE sling, coat, and R UE brace. Pt engaged in PNF D1/D2 exercises while supine in gravity eliminated position x 2 sets of 10 successfully. Supine >sit with close steady assistance to edge of mat. Pt able to demonstrate R wrist extension/extension from flexed to neutral position against gravity. Pt performed AROM of wrist 2 sets of 10 as well with rest breaks between each set for fatigue. Pt returning to room and transferring onto toilet with supervision for BM. Family member present in room and OT notified RN. Call bell within reach.   Therapy Documentation Precautions:  Precautions Precautions: Cervical Required Braces or Orthoses: Cervical Brace Cervical Brace: Hard collar Restrictions Weight Bearing Restrictions: No Other Position/Activity Restrictions: Pt with very weak core General:   Vital Signs: Therapy Vitals Temp: 98.5 F (36.9 C) Temp Source: Oral Pulse Rate: 99 Resp: 18 BP: 93/72 Patient Position (if appropriate): Sitting Oxygen Therapy SpO2: 99 % O2 Device: Not Delivered Pain: Pain Assessment Pain Score: 5  Pain Type: Acute pain Pain Location: Neck Pain Descriptors / Indicators: Aching;Discomfort Pain Intervention(s): Medication (See eMAR) ADL: ADL ADL Comments: refer to functional navigator Exercises:   Other Treatments:    See  Function Navigator for Current Functional Status.   Therapy/Group: Individual Therapy  [name] 09/21/2016, 4:36 PM

## 2016-09-21 NOTE — Progress Notes (Signed)
Physical Therapy Weekly Progress Note  Patient Details  Name: Patrick Romero MRN: [id number] Date of Birth: 07/05/1961  Beginning of progress report period: September 13, 2016 End of progress report period: September 21, 2016  Today's Date: 09/21/2016 PT Individual Time: 0800-0930 PT Individual Time Calculation (min): 90 min   Patient has met 4 of 4 short term goals.  Patient making steady progress towards long term goals. Continues to require min guard for gait due to RLE hypertonicity and poor foot clearance. No family present for therapy sessions thus far, however do recommend family attend therapy session prior to d/c to review recommendations. Pt will continue to benefit from inpatient PT to progress towards long term goals.   Patient continues to demonstrate the following deficits muscle weakness and muscle joint tightness, impaired timing and sequencing, abnormal tone, unbalanced muscle activation, ataxia and decreased coordination and decreased standing balance, decreased postural control and decreased balance strategies and therefore will continue to benefit from skilled PT intervention to increase functional independence with mobility.  Patient progressing toward long term goals..  Continue plan of care.  PT Short Term Goals Week 2:  PT Short Term Goal 1 (Week 2): Pt will perform bed mobility with minA from flat bed PT Short Term Goal 1 - Progress (Week 2): Met PT Short Term Goal 2 (Week 2): Pt will perform transfers with consistent min guard PT Short Term Goal 2 - Progress (Week 2): Met PT Short Term Goal 3 (Week 2): Pt will ambulate x150' with platform RW and S PT Short Term Goal 3 - Progress (Week 2): Met PT Short Term Goal 4 (Week 2): Pt will perform ascent/descent 4 stairs 6" height with modA PT Short Term Goal 4 - Progress (Week 2): Met Week 3:  PT Short Term Goal 1 (Week 3): =LTG due to estimated LOS  Skilled Therapeutic Interventions/Progress Updates:  Pt received seated in  w/c, c/o pain as below pre-medicated prior to therapy, and agreeable to treatment. TEDs donned totalA. Pt dons shoes with setupA and increased time; does not require assist for tying shoes d/t elastic shoelaces. Gait to gym min guard and no AD. Dynamic standing balance biodex catch game on static surface>unstable surface, and BUE support>no UE support; difficulty with anterior/posterior weight shifts and ankle strategy however only one LOB posteriorly requiring assist from therapist and UE support. Supine PNF RLE combination of isotonics D1 flexion/extension for improving LE coordination for foot clearance during gait and stairs. Shoulder PROM bicep, shoulder ER, finger flexors. Seated scapular ROM superior/inferior mobilizations for pain relief and improved mobility. Standing balance four square step test with min guard x4 trials; average speed 21 sec, below average for age/gender norms. Dynamic gait with min guard side stepping R/L, and braiding for BLE coordination. Nustep x8 min with BUE/BLE for strengthening and aerobic endurance. Gait to return to room min guard. Remained seated in w/c, all needs in reach.       Therapy Documentation Precautions:  Precautions Precautions: Cervical Required Braces or Orthoses: Cervical Brace Cervical Brace: Hard collar Restrictions Weight Bearing Restrictions: No Other Position/Activity Restrictions: Pt with very weak core Pain: Pain Assessment Pain Assessment: 0-10 Pain Score: 7  Faces Pain Scale: Hurts a little bit Pain Location: Neck Pain Descriptors / Indicators: Aching Pain Frequency: Constant Pain Intervention(s): Medication (See eMAR)   See Function Navigator for Current Functional Status.  Therapy/Group: Individual Therapy  Luberta Mutter 09/21/2016, 9:25 AM

## 2016-09-21 NOTE — Progress Notes (Signed)
Subjective/Complaints: Up at sink. No new issues. Feels well  ROS: pt denies nausea, vomiting, diarrhea, cough, shortness of breath or chest pain  Objective: Vital Signs: Blood pressure 101/66, pulse 77, temperature 98.3 F (36.8 C), temperature source Oral, resp. rate 18, weight 67 kg (147 lb 11.3 oz), SpO2 100 %. No results found. Results for orders placed or performed during the hospital encounter of 09/05/16 (from the past 72 hour(s))  Basic metabolic panel     Status: Abnormal   Collection Time: 09/19/16  5:55 AM  Result Value Ref Range   Sodium 135 135 - 145 mmol/L   Potassium 4.2 3.5 - 5.1 mmol/L   Chloride 100 (L) 101 - 111 mmol/L   CO2 28 22 - 32 mmol/L   Glucose, Bld 92 65 - 99 mg/dL   BUN 25 (H) 6 - 20 mg/dL   Creatinine, Ser 0.97 0.61 - 1.24 mg/dL   Calcium 9.5 8.9 - 10.3 mg/dL   GFR calc non Af Amer >60 >60 mL/min   GFR calc Af Amer >60 >60 mL/min    Comment: (NOTE) The eGFR has been calculated using the CKD EPI equation. This calculation has not been validated in all clinical situations. eGFR's persistently <60 mL/min signify possible Chronic Kidney Disease.    Anion gap 7 5 - 15     HEENT: Poor dentition. Normocephalic. Atraumatic.  Cardio: RRR Neck: C-collar in place. fitting Resp: CTA B GI: BS NT Skin:   Intact. Warm and dry.  Neuro: Alert/Oriented Motor 3- to 3/5 left deltoid, biceps, 2+ to 3- triceps, wrist extension 3/5, hand grip 2+ to 3-/5 . Able to utilize left hand without splint to assist with ADL's 3-/5 right deltoid muscle, biceps,2- triceps, wrist extension, hand grip 1+ to 2-/5--  3/5 at the ankle dorsiflexor, plantar flexor, 4 minus. Hip flexion, knee extension --- Musc: no edema  Gen.: NAD.    Assessment/Plan: 1. 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 Physiatrist is providing close team supervision and 24  hour management of active medical problems listed below. Physiatrist and rehab team continue to assess barriers to discharge/monitor patient progress toward functional and medical goals. FIM: Function - Bathing Position: Shower Body parts bathed by patient: Right arm, Left arm, Chest, Abdomen, Front perineal area Body parts bathed by helper: Buttocks, Right upper leg, Left upper leg, Right lower leg, Left lower leg, Back Assist Level: 2 helpers  Function- Upper Body Dressing/Undressing What is the patient wearing?: Pull over shirt/dress Pull over shirt/dress - Perfomed by patient: Thread/unthread right sleeve, Thread/unthread left sleeve, Pull shirt over trunk Pull over shirt/dress - Perfomed by helper: Put head through opening Function - Lower Body Dressing/Undressing What is the patient wearing?: Shoes Position: Wheelchair/chair at sink Pants- Performed by patient: Thread/unthread right pants leg, Thread/unthread left pants leg Pants- Performed by helper: Pull pants up/down Non-skid slipper socks- Performed by helper: Don/doff right sock, Don/doff left sock Shoes - Performed by patient: Don/doff right shoe, Don/doff left shoe (not fastened d/Romero elastic laces) Shoes - Performed by helper: Fasten right, Fasten left TED Hose - Performed by helper: Don/doff right TED hose, Don/doff left TED hose Assist for footwear: Partial/moderate assist Assist for lower body dressing: Touching or steadying assistance (Pt > 75%)  Function - Toileting Toileting activity did not occur: No continent bowel/bladder event Toileting steps completed by patient: Performs perineal hygiene Toileting steps completed by helper: Adjust clothing  prior to toileting, Performs perineal hygiene, Adjust clothing after toileting Toileting Assistive Devices: Grab bar or rail Assist level: Touching or steadying assistance (Pt.75%)  Function - Toilet Transfers Toilet transfer assistive device: Elevated toilet seat/BSC over  toilet Assist level to toilet: Supervision or verbal cues Assist level from toilet: Supervision or verbal cues  Function - Chair/bed transfer Chair/bed transfer method: Ambulatory Chair/bed transfer assist level: Touching or steadying assistance (Pt > 75%) Chair/bed transfer assistive device: Armrests Chair/bed transfer details: Manual facilitation for weight shifting, Verbal cues for technique, Verbal cues for sequencing, Visual cues/gestures for sequencing, Verbal cues for precautions/safety  Function - Locomotion: Wheelchair Type: Manual Max wheelchair distance: 150 Assist Level: Supervision or verbal cues Assist Level: Supervision or verbal cues Assist Level: Supervision or verbal cues Function - Locomotion: Ambulation Assistive device: No device Max distance:  (150 ft) Assist level: Touching or steadying assistance (Pt > 75%) Assist level: Touching or steadying assistance (Pt > 75%) Assist level: Touching or steadying assistance (Pt > 75%) Assist level: Touching or steadying assistance (Pt > 75%)  Function - Comprehension Comprehension: Auditory Comprehension assist level: Follows complex conversation/direction with no assist  Function - Expression Expression: Verbal Expression assist level: Expresses complex ideas: With no assist  Function - Social Interaction Social Interaction assist level: Interacts appropriately with others - No medications needed.  Function - Problem Solving Problem solving assist level: Solves complex problems: Recognizes & self-corrects  Function - Memory Memory assist level: Complete Independence: No helper Patient normally able to recall (first 3 days only): That he or she is in a hospital, Current season, Staff names and faces Medical Problem List and Plan: 1.  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  Continue CIR- PT, OT  -  2.  DVT Prophylaxis/Anticoagulation: Pharmaceutical: Lovenox 3. Pain Management: Continue  oxycodone prn for now.  Added ultram for moderate pain. Dysesthesias managed on current dose lyrica.  4. Mood: LCSW to follow for evaluation and support.  5. Neuropsych: This patient is capable of making decisions on his own behalf. 6. Skin/Wound Care:  Air mattress overlay. Pressure relief measures.  7. Fluids/Electrolytes/Nutrition:     -BUN slightly improved---continue to encourage fluids        Component Value Date/Time   NA 135 09/19/2016 0555   K 4.2 09/19/2016 0555   CL 100 (L) 09/19/2016 0555   CO2 28 09/19/2016 0555   GLUCOSE 92 09/19/2016 0555   BUN 25 (H) 09/19/2016 0555   CREATININE 0.97 09/19/2016 0555   CALCIUM 9.5 09/19/2016 0555   GFRNONAA >60 09/19/2016 0555   GFRAA >60 09/19/2016 0555   8. History of presyncope/syncope: Monitor for now.Unclear whether that the orthostatic changes are separate issue,Patient has had no orthostatic drops since using the TED hose and abdominal binder 9. Anemia: question of chronic disease due to Alcohol use.Normocytic, hemoglobin mildly decreased.  Cont to monitor     Component Value Date/Time   WBC 6.0 09/12/2016 0557   RBC 3.96 (L) 09/12/2016 0557   HGB 10.1 (L) 09/12/2016 0557   HCT 32.0 (L) 09/12/2016 0557   PLT 419 (H) 09/12/2016 0557   MCV 80.8 09/12/2016 0557   MCH 25.5 (L) 09/12/2016 0557   MCHC 31.6 09/12/2016 0557   RDW 12.7 09/12/2016 0557   LYMPHSABS 2.2 09/06/2016 0732   MONOABS 0.9 09/06/2016 0732   EOSABS 0.3 09/06/2016 0732   BASOSABS 0.0 09/06/2016 0732   10. Mid chest wall pain:  Resolved  likely musculoskeletal  11. Right renal cyst:  Will need non-emergent follow up ultrasound for work up.  12. ETOH abuse/Tobacco: Counsel as appropriate  LOS (Days) 16 A FACE TO FACE EVALUATION WAS PERFORMED  Patrick Romero 09/21/2016, 9:12 AM

## 2016-09-21 NOTE — Progress Notes (Signed)
Occupational Therapy Weekly Progress Note  Patient Details  Name: Patrick Romero MRN: [id number] Date of Birth: 1961/06/25  Beginning of progress report period: September 13, 2016 End of progress report period: September 21, 2016   Today's Date: 09/21/2016 OT Individual Time: 0930-1030 OT Individual Time Calculation (min): 60 min    Patient has met 4 of 4 short term goals.  Pt making excellent progress towards OT goals. He has much improved balance/ coordination and functional use of L UE. He cont to be most limited by impaired R UE, able to use only at stabilizer level. He currently requires supervision- min A for functional mobility and transfers. D/c date moved up due to pt progress with goals remaining at supervision-min A level. He remains very motivated and hard working during all tx sessions. Will plan for family training prior to d/c home.   Patient continues to demonstrate the following deficits: muscle weakness and muscle paralysis, decreased cardiorespiratoy endurance and decreased sitting balance, decreased standing balance, decreased postural control and decreased balance strategies and therefore will continue to benefit from skilled OT intervention to enhance overall performance with BADL and Reduce care partner burden.  Patient progressing toward long term goals..  Continue plan of care.  OT Short Term Goals Week 2:  OT Short Term Goal 1 (Week 2): Pt will complete oral care in standing following set-up with AE with CGA OT Short Term Goal 1 - Progress (Week 2): Met OT Short Term Goal 2 (Week 2): Pt will don pants with mod A sit <> stand.  OT Short Term Goal 2 - Progress (Week 2): Met OT Short Term Goal 3 (Week 2): Pt will use L UE to thread R UE into shirt sleeve with set-up assist.  OT Short Term Goal 3 - Progress (Week 2): Met OT Short Term Goal 4 (Week 2): Pt will complete 1/3 toileting steps with steadying assist in order to decrease caregiver burden.  OT Short Term Goal 4 -  Progress (Week 2): Met Week 3:  OT Short Term Goal 1 (Week 3): STG=LTG due to LOS  Skilled Therapeutic Interventions/Progress Updates:    Pt seen for OT session focusing on functional transfers and UE use. Pt sitting up in w/c upon arrival with hand off from PT. Pt agreeeable to tx session. He declined bathing/dressing this session. He ambulated with very close supervision to ADL apartment and practiced simulated shower stall transfer to shower seat. Completed x2 trials with min A. Pt demonstrated dificulty clearing R foot over threashold and therefore required increased assist and VCs for awareness. Then practiced tub/shower transfer utilizing tub bench, completed with supervision following demonstration. Recommend this method as safest at d/c, pt as access to both shower types, and voiced understanding and agreement with recommendation. In therapy gym, pt sat EOM and addressed hand witting initially using L UE and trying various AE on pen (standard pen, coban around pen, and built up red gripper- legible handwriting with all writing implements. He then completed hand witting with R UE, initially with hand over hand assist to write name, progressed to writing numbers 1-10 independently with increased time. Completed towel pushes on table for shoulder protraction/retraction and exercise for internal/external rotation with R UE. Encouraged pt to do these exercises outside of tx time.  Practiced gross grasping with R UE with bean bags, pt with very diminished gross grasp strength, however, when positioned correctly was able to pick up item. He ambulated back to room at end of session in same  manner as described above. Left seated in w/c with all needs in reach.    Therapy Documentation Precautions:  Precautions Precautions: Cervical Required Braces or Orthoses: Cervical Brace Cervical Brace: Hard collar Restrictions Weight Bearing Restrictions: No Other Position/Activity Restrictions: Pt with very  weak core ADL: ADL ADL Comments: refer to functional navigator  See Function Navigator for Current Functional Status.   Therapy/Group: Individual Therapy  Romero, Patrick Beever C 09/21/2016, 7:15 AM

## 2016-09-21 NOTE — Progress Notes (Addendum)
Physical Therapy Note  Patient Details  Name: Patrick Romero MRN: [id number] Date of Birth: Nov 22, 1960 Today's Date: 09/21/2016  1400-1500, 60 min individual tx Pain: 7/10, premdicated  RUE sling except during towel folding activity.  Gait to/from therapy with close supervision.  Therapeutic activity in standing, reaching L with L hand for piece of linen, and using table in front to fold linens, using R hand for gross assist with urging; x 5 minutes , x 5 minutes standing on Airex compliant mat.  LLE trembling noted, but no LOB. Dynamic standing balance challenge on Airex mat, during 10 x 2 marching, 10 x 1 mini squats, calf raises. neuromuscular re-education via forced use for sustained stretch bil heel cords and hamstrings in standing on wedge, x 2 minutes without UE support. Given external perturbations, pt exhibits L ankle strategy and L stepping strategy.  Absent bil hip and R ankle and R stepping.  With practice, pt demonstrated bil hip strategy and strong L ankle strategy. Pt moved into tall kneeling on mat; 5 x 1 terminal hip extension and 10 x 1 marching on knees. Gait to return to room, kicking Yoga block for increased wt shifting, occasional min assist for LOB due to scissoring. Pt left resting in w/c with all needs within reach. See function navigator for current status.  Patrick Romero 09/21/2016, 12:38 PM

## 2016-09-22 ENCOUNTER — Inpatient Hospital Stay (HOSPITAL_COMMUNITY): Payer: Medicaid Other

## 2016-09-22 ENCOUNTER — Inpatient Hospital Stay (HOSPITAL_COMMUNITY): Payer: Medicaid Other | Admitting: Occupational Therapy

## 2016-09-22 ENCOUNTER — Inpatient Hospital Stay (HOSPITAL_COMMUNITY): Payer: Self-pay | Admitting: Physical Therapy

## 2016-09-22 NOTE — Progress Notes (Signed)
Physical Therapy Session Note  Patient Details  Name: Patrick Romero MRN: [id number] Date of Birth: [birth year]  Today's Date: 09/22/2016 PT Individual Time: 1400-1500 PT Individual Time Calculation (min): 60 min   Short Term Goals: Week 3:  PT Short Term Goal 1 (Week 3): =LTG due to estimated LOS  Skilled Therapeutic Interventions/Progress Updates: Pt received seated in w/c, denies pain and agreeable to treatment. Gait to/from gym with close S. Orthotist present and consulted on toe-cap for RLE to improve foot clearance and progression. Stairs 1x16 with 1 handrail and S; min cues for RLE hip/knee flexion with reduced circumduction. Sitting/standing kinetron for BLE coordination; standing progressed BUE>no UE support with min guard. Difficulty with weight shift to L side; improved with repetition. Standing gastroc/soleus stretch at 3" stairs with UE support. Pt able to demonstrate back runners stretch he was taught yesterday by another therapist. Pt reports light headedness following standing stretches; BP assessed 107/77 HR 102 bpm. Dizziness resolved with rest, however donned abdominal binder prophylactic reasons. Dynamic gait in hallway to locate and retrieve numbered targets for practice with direction changes and cognitive dual task. Returned to room as above; remained seated in w/c at end of session, all needs in reach.  .     Therapy Documentation Precautions:  Precautions Precautions: Cervical Required Braces or Orthoses: Cervical Brace Cervical Brace: Hard collar Restrictions Weight Bearing Restrictions: No Other Position/Activity Restrictions: Pt with very weak core  See Function Navigator for Current Functional Status.   Therapy/Group: Individual Therapy  Luberta Mutter 09/22/2016, 3:44 PM

## 2016-09-22 NOTE — Progress Notes (Signed)
Social Work Patient ID: Patrick Romero, male   DOB: 1960-09-24, 56 y.o.   MRN: [id number]   Have reviewed team conference with pt who is aware and agreeable with change of d/c date to 3/23.  He agrees that he is making good progress and feels he will be ready for this.  Will schedule family ed to take place next week.  Makynzee Tigges, LCSW

## 2016-09-22 NOTE — Progress Notes (Signed)
Physical Therapy Note  Patient Details  Name: Patrick Romero MRN: [id number] Date of Birth: 1960-11-03 Today's Date: 09/22/2016  9373-4287, 90 min individual tx Pain: 7/10 neck; premdicated  Pt donned shoes in sitting with min assist.  Gait on leel terrain without AD, close supervision. Gait stiff this AM, compared to tx yesterday when this PT saw him in PM.  neuromuscular re-education via forced use, multimodal cues for alternating reciprocal movement x bil LEs at level 6, x 10 minutes.  Sustained stretch bil heel cords while standing on wedge x 3 minutes, x 2.  Instructed pt in runner's stretch in standing and issued hand-out; pt performed x 30 sec x 2 R/L LEs.  Pt tends to drift R with LLE stretch. Self stretching bil quads in sitting with feet under him, x 3 minutes. [place] exs in standing with LUE support : mini squats x 10 and R/L hip abduction x 8 each. Balance challenges standing on compliant Airex mat, during static standing, wt shifting L><R (with over shift to R), and heel raises x 10 each. Up/down 4 6" steps L rail, step through technique with supervision, no knee buckling. Isometric L hip protraction for activation of core muscles, followed by reciprocal scooting forward, backward for trunk shortening/lenthening with wt shifting with feet supported> unsupported, witout use of UEs.  Pt reported he had a L hip injury approx 2 years ago; he did not seek medical attention at the time.  Pt left resting in w/c with all needs within reach.   See function navigator for current status.  Alexius Hangartner 09/22/2016, 7:51 AM

## 2016-09-22 NOTE — Progress Notes (Signed)
Occupational Therapy Session Note  Patient Details  Name: AUTHER LYERLY MRN: [id number] Date of Birth: 1961-01-21  Today's Date: 09/22/2016 OT Individual Time: 1100-1200 and 1300-1330 OT Individual Time Calculation (min): 60 min and 30 min   Short Term Goals: Week 3:  OT Short Term Goal 1 (Week 3): STG=LTG due to LOS  Skilled Therapeutic Interventions/Progress Updates:    Session One: Pt seen for OT ADL bathing/dressing session. Pt sitting up in w/c upon arrival, voicing desire to shower. He ambulated throughout room and completed functional transfers with min A. He bathed seated on tub bench, hand over hand assist provided for using R UE to wash L arm. He dressed seated EOB with VCs for hemi-dressing technique, min A standing balance to pull pants up.  He ambulated to therapy gym and completed towel pushes with R UE supported on table, completed in all planes. Pt ambulated back to room at end of session, LOB episode requiring min-mod A to regain balance. Pt left seated in w/c upon arrival, all needs in reach.   Session Two: Pt seen for OT session focusing on functional standing balance/ endurance and UE ROM. Pt sitting up in w/c upon arrival, agreeable to tx session. He ambulated with min A throughout unit. x2 LOB episodes requiring mod A to regain balance. Completed x2 trials of standing pendulum arm movements for R UE, pt educated regarding purpose and benefits of shoulder mobility.  He then completed standing activity utilizing Dynavision. Using L UE, pt required to stand to complete task, required to reach into all planes and across midline, completed with min A for balance. In sitting position, completed isolated movements with R UE with steadying assist at wrist. Pt with shoulder flexion/extension to initiate movement, however, unable to manage weight of arm/wrist for unsupported UE movements.  He completed 1 trial sit <> stand during dynavision task, required to sit and return to  stand before hitting light. Completed with steadying assist and required extended seated rest break following task. Pt ambulated back to room at end of session, left seated with all needs in reach.   Therapy Documentation Precautions:  Precautions Precautions: Cervical Required Braces or Orthoses: Cervical Brace Cervical Brace: Hard collar Restrictions Weight Bearing Restrictions: No Other Position/Activity Restrictions: Pt with very weak core Pain:   Soreness in neck, RN aware and medication administered ADL: ADL ADL Comments: refer to functional navigator  See Function Navigator for Current Functional Status.   Therapy/Group: Individual Therapy  [name], Markeem Noreen C 09/22/2016, 7:13 AM

## 2016-09-22 NOTE — Progress Notes (Signed)
Subjective/Complaints: Eating breakfast in chair. Tech found some blood in stool yesterday. Pt concerned.   ROS: pt denies nausea, vomiting, diarrhea, cough, shortness of breath or chest pain   Objective: Vital Signs: Blood pressure 104/77, pulse 73, temperature 97.4 F (36.3 C), temperature source Oral, resp. rate 18, weight 67 kg (147 lb 11.3 oz), SpO2 100 %. No results found. No results found for this or any previous visit (from the past 72 hour(s)).   HEENT: Poor dentition. Normocephalic. Atraumatic.  Cardio:RRR Neck: C-collar in place. fitting Resp: CTA B GI: BS NT Skin:   Intact. Warm and dry.  Neuro: Alert/Oriented Motor 3- to 3/5 left deltoid, biceps, 2+ to 3- triceps, wrist extension 3/5, hand grip 3 to 3+/5 . 3-/5 right deltoid muscle, biceps,2- triceps, wrist extension, hand grip   2-/5--  3/5 at the ankle dorsiflexor, plantar flexor, 4 minus. Hip flexion, knee extension --- Musc: no edema  Gen.: NAD.    Assessment/Plan: 1. 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 Physiatrist is providing close team supervision and 24 hour management of active medical problems listed below. Physiatrist and rehab team continue to assess barriers to discharge/monitor patient progress toward functional and medical goals. FIM: Function - Bathing Position: Shower Body parts bathed by patient: Right arm, Left arm, Chest, Abdomen, Front perineal area Body parts bathed by helper: Buttocks, Right upper leg, Left upper leg, Right lower leg, Left lower leg, Back Assist Level: 2 helpers  Function- Upper Body Dressing/Undressing What is the patient wearing?: Pull over shirt/dress Pull over shirt/dress - Perfomed by patient: Thread/unthread right sleeve, Thread/unthread left sleeve, Pull shirt over trunk Pull over shirt/dress - Perfomed by helper: Put head through  opening Function - Lower Body Dressing/Undressing What is the patient wearing?: Shoes Position: Wheelchair/chair at sink Pants- Performed by patient: Thread/unthread right pants leg, Thread/unthread left pants leg Pants- Performed by helper: Pull pants up/down Non-skid slipper socks- Performed by helper: Don/doff right sock, Don/doff left sock Shoes - Performed by patient: Don/doff right shoe, Don/doff left shoe (not fastened d/t elastic laces) Shoes - Performed by helper: Fasten right, Fasten left TED Hose - Performed by helper: Don/doff right TED hose, Don/doff left TED hose Assist for footwear: Partial/moderate assist Assist for lower body dressing: Touching or steadying assistance (Pt > 75%)  Function - Toileting Toileting activity did not occur: No continent bowel/bladder event Toileting steps completed by patient: Performs perineal hygiene Toileting steps completed by helper: Adjust clothing prior to toileting, Performs perineal hygiene, Adjust clothing after toileting Toileting Assistive Devices: Grab bar or rail Assist level: Touching or steadying assistance (Pt.75%)  Function - Air cabin crew transfer assistive device: Elevated toilet seat/BSC over toilet Assist level to toilet: Supervision or verbal cues Assist level from toilet: Supervision or verbal cues  Function - Chair/bed transfer Chair/bed transfer method: Ambulatory Chair/bed transfer assist level: Supervision or verbal cues Chair/bed transfer assistive device: Armrests Chair/bed transfer details: Verbal cues for technique, Verbal cues for precautions/safety  Function - Locomotion: Wheelchair Type: Manual Max wheelchair distance: 150 Assist Level: Supervision or verbal cues Assist Level: Supervision or verbal cues Assist Level: Supervision or verbal cues Function - Locomotion: Ambulation Assistive device: No device Max distance: 80 Assist level: Supervision or verbal cues Assist level: Supervision  or verbal cues Assist level: Supervision or verbal cues Assist level: Touching or steadying assistance (Pt > 75%)  Function - Comprehension Comprehension: Auditory Comprehension assist  level: Follows complex conversation/direction with no assist  Function - Expression Expression: Verbal Expression assist level: Expresses complex ideas: With no assist  Function - Social Interaction Social Interaction assist level: Interacts appropriately with others - No medications needed.  Function - Problem Solving Problem solving assist level: Solves complex problems: Recognizes & self-corrects  Function - Memory Memory assist level: Complete Independence: No helper Patient normally able to recall (first 3 days only): That he or she is in a hospital, Current season, Staff names and faces Medical Problem List and Plan: 1.  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  Continue CIR- PT, OT  - progressing with therapy.  2.  DVT Prophylaxis/Anticoagulation: Pharmaceutical: Lovenox 3. Pain Management: Continue oxycodone prn for now.  Added ultram for moderate pain. Dysesthesias managed on current dose lyrica.  4. Mood: LCSW to follow for evaluation and support.  5. Neuropsych: This patient is capable of making decisions on his own behalf. 6. Skin/Wound Care:  Air mattress overlay. Pressure relief measures.  7. Fluids/Electrolytes/Nutrition:     -BUN slightly improved---recheck in am        Component Value Date/Time   NA 135 09/19/2016 0555   K 4.2 09/19/2016 0555   CL 100 (L) 09/19/2016 0555   CO2 28 09/19/2016 0555   GLUCOSE 92 09/19/2016 0555   BUN 25 (H) 09/19/2016 0555   CREATININE 0.97 09/19/2016 0555   CALCIUM 9.5 09/19/2016 0555   GFRNONAA >60 09/19/2016 0555   GFRAA >60 09/19/2016 0555   8. History of presyncope/syncope: Monitor for now.Unclear whether that the orthostatic changes are separate issue,Patient has had no orthostatic drops since using the TED hose and  abdominal binder 9. Anemia: question of chronic disease due to Alcohol use.Normocytic, hemoglobin mildly decreased.  -given blood in stool, will re-check cbc tomorrow     Component Value Date/Time   WBC 6.0 09/12/2016 0557   RBC 3.96 (L) 09/12/2016 0557   HGB 10.1 (L) 09/12/2016 0557   HCT 32.0 (L) 09/12/2016 0557   PLT 419 (H) 09/12/2016 0557   MCV 80.8 09/12/2016 0557   MCH 25.5 (L) 09/12/2016 0557   MCHC 31.6 09/12/2016 0557   RDW 12.7 09/12/2016 0557   LYMPHSABS 2.2 09/06/2016 0732   MONOABS 0.9 09/06/2016 0732   EOSABS 0.3 09/06/2016 0732   BASOSABS 0.0 09/06/2016 0732   10. Mid chest wall pain:  Resolved  likely musculoskeletal  11. Right renal cyst: Will need non-emergent follow up ultrasound for work up.  12. ETOH abuse/Tobacco: Counsel as appropriate  LOS (Days) 17 A FACE TO FACE EVALUATION WAS PERFORMED  Crissy Mccreadie T 09/22/2016, 9:06 AM

## 2016-09-23 ENCOUNTER — Inpatient Hospital Stay (HOSPITAL_COMMUNITY): Payer: Self-pay | Admitting: Physical Therapy

## 2016-09-23 ENCOUNTER — Inpatient Hospital Stay (HOSPITAL_COMMUNITY): Payer: Medicaid Other | Admitting: Occupational Therapy

## 2016-09-23 ENCOUNTER — Inpatient Hospital Stay (HOSPITAL_COMMUNITY): Payer: Self-pay | Admitting: Occupational Therapy

## 2016-09-23 LAB — CBC
HCT: 29 % — ABNORMAL LOW (ref 39.0–52.0)
HEMOGLOBIN: 9 g/dL — AB (ref 13.0–17.0)
MCH: 25.3 pg — ABNORMAL LOW (ref 26.0–34.0)
MCHC: 31 g/dL (ref 30.0–36.0)
MCV: 81.5 fL (ref 78.0–100.0)
PLATELETS: 408 10*3/uL — AB (ref 150–400)
RBC: 3.56 MIL/uL — AB (ref 4.22–5.81)
RDW: 12.9 % (ref 11.5–15.5)
WBC: 6.2 10*3/uL (ref 4.0–10.5)

## 2016-09-23 LAB — BASIC METABOLIC PANEL
ANION GAP: 9 (ref 5–15)
BUN: 24 mg/dL — ABNORMAL HIGH (ref 6–20)
CALCIUM: 9.7 mg/dL (ref 8.9–10.3)
CHLORIDE: 99 mmol/L — AB (ref 101–111)
CO2: 29 mmol/L (ref 22–32)
CREATININE: 0.91 mg/dL (ref 0.61–1.24)
GFR calc non Af Amer: >60 mL/min (ref >60)
Glucose, Bld: 94 mg/dL (ref 65–99)
Potassium: 4.3 mmol/L (ref 3.5–5.1)
SODIUM: 137 mmol/L (ref 135–145)

## 2016-09-23 MED ORDER — NYSTATIN 100000 UNIT/GM EX POWD
Freq: Three times a day (TID) | CUTANEOUS | Status: DC
  Administered 2016-09-23 – 2016-09-30 (×15): via TOPICAL
  Filled 2016-09-23: qty 15

## 2016-09-23 MED ORDER — DOXYCYCLINE HYCLATE 100 MG PO TABS
100.0000 mg | ORAL_TABLET | Freq: Two times a day (BID) | ORAL | Status: DC
  Administered 2016-09-23 – 2016-09-30 (×14): 100 mg via ORAL
  Filled 2016-09-23 (×14): qty 1

## 2016-09-23 NOTE — Progress Notes (Signed)
Occupational Therapy Session Note  Patient Details  Name: Patrick Romero MRN: [id number] Date of Birth: 1961-01-30  Today's Date: 09/23/2016 OT Individual Time: 1000-1100 OT Individual Time Calculation (min): 60 min    Short Term Goals: Week 3:  OT Short Term Goal 1 (Week 3): STG=LTG due to LOS  Skilled Therapeutic Interventions/Progress Updates:    Pt seen for OT session focusing on UE strengthening/ endurance, and functional ambulation. Pt sitting up in w/c upon arrival, agreeable to tx session and agreeable to going outside for session. Taken outside total A in w/c for time and energy conservation.  Completed standing ball toss activity with min A for dynamic balance. In sitting, pt held 1# dowel rod, ACE wrap for R UE and with steadying assist at wrist and elbow for extension.  He ambulated outside over uneven terrain and up/down ramps with steadying assist, occasional VCs for foot placement/ awareness. He returned to unit and in therapy gym worked on supine UE ROM on therapy mat. Focus on control of gross grasping with R UE and shoulder flexion ROM/ stretching. Pt ambulated back to room at end of session, left with all needs in reach.   Therapy Documentation Precautions:  Precautions Precautions: Cervical Required Braces or Orthoses: Cervical Brace Cervical Brace: Hard collar Restrictions Weight Bearing Restrictions: No Other Position/Activity Restrictions: Pt with very weak core Pain: No/ denies pain ADL: ADL ADL Comments: refer to functional navigator  See Function Navigator for Current Functional Status.   Therapy/Group: Individual Therapy  [name], Kenedee Molesky C 09/23/2016, 6:57 AM

## 2016-09-23 NOTE — Progress Notes (Signed)
Physical Therapy Session Note  Patient Details  Name: Patrick Romero MRN: [id number] Date of Birth: 07/21/1960  Today's Date: 09/23/2016 PT Individual Time: 0800-0900 and 1300-1430 PT Individual Time Calculation (min): 60 min and 90 min (total 150 min)   Short Term Goals: Week 3:  PT Short Term Goal 1 (Week 3): =LTG due to estimated LOS  Skilled Therapeutic Interventions/Progress Updates: Tx 1: pt received seated in w/c, denies pain and agreeable to treatment. Pt dons shoes with minA due to difficulty with RLE and several unsuccessful attempts. Gait to gym with close S. Performed nustep x10 min with BUE/BLE for NMR and coordination. Stairs 1x16 with L handrail and S; cued pt to perform reciprocal gait pattern, and reduce circumduction with RLE during ascent. Biodex catch game on stable surface>unstable surface progressed level 12>8 with practice, occasional LOBs posteriorly requiring use of LUE to regain balance. Performed floor transfer minA overall; mod cues for problem solving technique. Bed mobility on flat bed in ADL apartment with modI. Car transfer performed ambulatory with S. Gait on ramp with S; required min guard for gait on mulch and curb step with use of L handle for balance. Gait to return to room with close S; improving ability to engage in cognitive dual task, direction changes, visual scanning without significant LOB. Remained seated in w/c with friends present and all needs in reach.   Tx 2: Pt received seated in w/c with family present; denies pain and agreeable to treatment however does report he is very fatigued. Pt changed clothing with setupA for lower body dressing and S for standing balance. Pt reported smelling foul smell, requests to clean lower body. Pt cleaned groin and front of legs, therapist assisted with cleaning back of legs and buttocks. Pt carried dirty clothing in bag to laundry room; managed laundry machine without assist using LUE. Engaged pt in dynamic standing  balance wii tennis and bowling games in standing for cognitive dual task, dynamic challenge, ankle/hip and stepping strategies. Dynamic sitting balance on stability ball with LE marching and long arc quads for core stability and coordination. Furniture transfer performed with S. Use of BUEs for managing mouse and typing on keyboard. Gait to return to room with S overall. Remained seated in w/c at end of session, all needs in reach.      Therapy Documentation Precautions:  Precautions Precautions: Cervical Required Braces or Orthoses: Cervical Brace Cervical Brace: Hard collar Restrictions Weight Bearing Restrictions: No Other Position/Activity Restrictions: Pt with very weak core   See Function Navigator for Current Functional Status.   Therapy/Group: Individual Therapy  Luberta Mutter 09/23/2016, 9:25 AM

## 2016-09-23 NOTE — Progress Notes (Signed)
Subjective/Complaints: No new complaints feels well. Had a good night.   Objective: Vital Signs: Blood pressure 103/78, pulse 98, temperature 99 F (37.2 C), temperature source Oral, resp. rate 16, weight 67 kg (147 lb 11.3 oz), SpO2 100 %. No results found. Results for orders placed or performed during the hospital encounter of 09/05/16 (from the past 72 hour(s))  CBC     Status: Abnormal   Collection Time: 09/23/16  5:37 AM  Result Value Ref Range   WBC 6.2 4.0 - 10.5 K/uL   RBC 3.56 (L) 4.22 - 5.81 MIL/uL   Hemoglobin 9.0 (L) 13.0 - 17.0 g/dL   HCT 50.5 (L) 39.7 - 67.3 %   MCV 81.5 78.0 - 100.0 fL   MCH 25.3 (L) 26.0 - 34.0 pg   MCHC 31.0 30.0 - 36.0 g/dL   RDW 41.9 37.9 - 02.4 %   Platelets 408 (H) 150 - 400 K/uL  Basic metabolic panel     Status: Abnormal   Collection Time: 09/23/16  5:37 AM  Result Value Ref Range   Sodium 137 135 - 145 mmol/L   Potassium 4.3 3.5 - 5.1 mmol/L   Chloride 99 (L) 101 - 111 mmol/L   CO2 29 22 - 32 mmol/L   Glucose, Bld 94 65 - 99 mg/dL   BUN 24 (H) 6 - 20 mg/dL   Creatinine, Ser 0.97 0.61 - 1.24 mg/dL   Calcium 9.7 8.9 - 35.3 mg/dL   GFR calc non Af Amer >60 >60 mL/min   GFR calc Af Amer >60 >60 mL/min    Comment: (NOTE) The eGFR has been calculated using the CKD EPI equation. This calculation has not been validated in all clinical situations. eGFR's persistently <60 mL/min signify possible Chronic Kidney Disease.    Anion gap 9 5 - 15     HEENT: Poor dentition. Normocephalic. Atraumatic.  Cardio: RRR Neck: C-collar in place. fitting Resp: CTA B GI: BS NT Skin:   Intact. Warm and dry.  Neuro: Alert/Oriented Motor 3- to 3/5 left deltoid, biceps, 2+ to 3- triceps, wrist extension 3/5, hand grip 3 to 3+/5 . 3-/5 right deltoid muscle, biceps,2- triceps, wrist extension, hand grip   2-/5  3+/5 at the ankle dorsiflexor, plantar flexor, 4 minus KE/HF Musc: no edema  Gen.: NAD.    Assessment/Plan: 1. Functional deficits secondary  to tetraplegia central cord syndrome which require 3+ hours per day of interdisciplinary therapy in a comprehensive inpatient rehab setting. Physiatrist is providing close team supervision and 24 hour management of active medical problems listed below. Physiatrist and rehab team continue to assess barriers to discharge/monitor patient progress toward functional and medical goals. FIM: Function - Bathing Position: Shower Body parts bathed by patient: Right arm, Chest, Abdomen, Front perineal area, Buttocks, Right upper leg, Left upper leg, Right lower leg, Left lower leg Body parts bathed by helper: Left arm, Back Assist Level: Touching or steadying assistance(Pt > 75%)  Function- Upper Body Dressing/Undressing What is the patient wearing?: Pull over shirt/dress Pull over shirt/dress - Perfomed by patient: Thread/unthread right sleeve, Thread/unthread left sleeve, Pull shirt over trunk, Put head through opening Pull over shirt/dress - Perfomed by helper: Put head through opening Assist Level: Touching or steadying assistance(Pt > 75%) Function - Lower Body Dressing/Undressing What is the patient wearing?: Pants, Ted Hose, Shoes Position: Sitting EOB Pants- Performed by patient: Thread/unthread right pants leg, Thread/unthread left pants leg, Pull pants up/down Pants- Performed by helper: Pull pants up/down Non-skid slipper socks- Performed  by helper: Don/doff right sock, Don/doff left sock Shoes - Performed by patient: Don/doff right shoe, Don/doff left shoe Shoes - Performed by helper: Fasten right, Fasten left TED Hose - Performed by helper: Don/doff right TED hose, Don/doff left TED hose Assist for footwear: Setup Assist for lower body dressing: Touching or steadying assistance (Pt > 75%)  Function - Toileting Toileting activity did not occur: No continent bowel/bladder event Toileting steps completed by patient: Performs perineal hygiene Toileting steps completed by helper: Adjust  clothing prior to toileting, Adjust clothing after toileting Toileting Assistive Devices: Grab bar or rail Assist level: Touching or steadying assistance (Pt.75%)  Function - Air cabin crew transfer assistive device: Elevated toilet seat/BSC over toilet Assist level to toilet: Supervision or verbal cues Assist level from toilet: Supervision or verbal cues  Function - Chair/bed transfer Chair/bed transfer method: Ambulatory Chair/bed transfer assist level: Supervision or verbal cues Chair/bed transfer assistive device: Armrests Chair/bed transfer details: Verbal cues for precautions/safety  Function - Locomotion: Wheelchair Type: Manual Max wheelchair distance: 150 Assist Level: Supervision or verbal cues Assist Level: Supervision or verbal cues Assist Level: Supervision or verbal cues Function - Locomotion: Ambulation Assistive device: No device Max distance: 150 Assist level: Supervision or verbal cues Assist level: Supervision or verbal cues Assist level: Supervision or verbal cues Assist level: Supervision or verbal cues  Function - Comprehension Comprehension: Auditory Comprehension assist level: Follows complex conversation/direction with no assist  Function - Expression Expression: Verbal Expression assist level: Expresses complex ideas: With no assist  Function - Social Interaction Social Interaction assist level: Interacts appropriately with others - No medications needed.  Function - Problem Solving Problem solving assist level: Solves complex problems: Recognizes & self-corrects  Function - Memory Memory assist level: Complete Independence: No helper Patient normally able to recall (first 3 days only): That he or she is in a hospital, Staff names and faces Medical Problem List and Plan: 1.  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  Continue CIR- PT, OT  -remains motivated 2.  DVT Prophylaxis/Anticoagulation: Pharmaceutical:  Lovenox 3. Pain Management: Continue oxycodone prn for now.  Added ultram for moderate pain. Dysesthesias managed on current dose lyrica.  4. Mood: LCSW to follow for evaluation and support.  5. Neuropsych: This patient is capable of making decisions on his own behalf. 6. Skin/Wound Care:  Air mattress overlay. Pressure relief measures.  7. Fluids/Electrolytes/Nutrition:     -BUN slowly improving (24 today)       Component Value Date/Time   NA 137 09/23/2016 0537   K 4.3 09/23/2016 0537   CL 99 (L) 09/23/2016 0537   CO2 29 09/23/2016 0537   GLUCOSE 94 09/23/2016 0537   BUN 24 (H) 09/23/2016 0537   CREATININE 0.91 09/23/2016 0537   CALCIUM 9.7 09/23/2016 0537   GFRNONAA >60 09/23/2016 0537   GFRAA >60 09/23/2016 0537   8. History of presyncope/syncope: Monitor for now.Unclear whether that the orthostatic changes are separate issue,Patient has had no orthostatic drops since using the TED hose and abdominal binder 9. Anemia: question of chronic disease due to Alcohol use.Normocytic   -small amount of blood in stool Wednesday. No further issues clinically  -hgb 9.0 (down from 10.1) today---recheck Monday     Component Value Date/Time   WBC 6.2 09/23/2016 0537   RBC 3.56 (L) 09/23/2016 0537   HGB 9.0 (L) 09/23/2016 0537   HCT 29.0 (L) 09/23/2016 0537   PLT 408 (H) 09/23/2016 0537   MCV 81.5 09/23/2016 0537  MCH 25.3 (L) 09/23/2016 0537   MCHC 31.0 09/23/2016 0537   RDW 12.9 09/23/2016 0537   LYMPHSABS 2.2 09/06/2016 0732   MONOABS 0.9 09/06/2016 0732   EOSABS 0.3 09/06/2016 0732   BASOSABS 0.0 09/06/2016 0732   10. Mid chest wall pain:  Resolved  likely musculoskeletal  11. Right renal cyst: Will need non-emergent follow up ultrasound for work up.  12. ETOH abuse/Tobacco: Counsel as appropriate  LOS (Days) 18 A FACE TO FACE EVALUATION WAS PERFORMED  SWARTZ,ZACHARY T 09/23/2016, 9:05 AM

## 2016-09-23 NOTE — Progress Notes (Signed)
Occupational Therapy Session Note  Patient Details  Name: MONICA ZAHLER MRN: [id number] Date of Birth: 1961/04/15  Today's Date: 09/23/2016 OT Individual Time: 1435-1501 OT Individual Time Calculation (min): 26 min    Skilled Therapeutic Interventions/Progress Updates:    Applied NMES to the right shoulder secondary to demonstrating increased shoulder pain and slight inferior anterior subluxation.  Pt tolerated 16 mins of active stimulation at 10 secs on and 10 secs off.  Intensity on level 40 with PPS at 35, Pulse width at 300 and waveform asymmetrical. He sat in wheelchair during stimulation as well.  No adverse reaction to stimuli with pt reporting no pain at end of session with RUE positioned in wheelchair.    Therapy Documentation Precautions:  Precautions Precautions: Cervical Required Braces or Orthoses: Cervical Brace Cervical Brace: Hard collar Restrictions Weight Bearing Restrictions: No Other Position/Activity Restrictions: Pt with very weak core  Pain: Pain Assessment Pain Assessment: No/denies pain ADL: See Function Navigator for Current Functional Status.   Therapy/Group: Individual Therapy  Winthrop Shannahan OTR/L 09/23/2016, 3:57 PM

## 2016-09-23 NOTE — Progress Notes (Addendum)
Patient reporting scrotal rash with itching and soreness. Started developing last Sunday after incontinent episode. Posterior aspect of scrotum and inguinal area macerated apperaing (has been using an ointment) with odor. White bumps with evidence of folliculitis--+odor but no drainage and non-tender to touch.  Will order nystatin powder help with moisture and start doxycycline for treatment.

## 2016-09-24 ENCOUNTER — Inpatient Hospital Stay (HOSPITAL_COMMUNITY): Payer: Medicaid Other | Admitting: Physical Therapy

## 2016-09-24 DIAGNOSIS — R208 Other disturbances of skin sensation: Secondary | ICD-10-CM

## 2016-09-24 DIAGNOSIS — M792 Neuralgia and neuritis, unspecified: Secondary | ICD-10-CM

## 2016-09-24 DIAGNOSIS — M4712 Other spondylosis with myelopathy, cervical region: Secondary | ICD-10-CM

## 2016-09-24 NOTE — Progress Notes (Signed)
Physical Therapy Session Note  Patient Details  Name: Patrick Romero MRN: [id number] Date of Birth: Jan 28, 1961  Today's Date: 09/24/2016 PT Individual Time: 1100-1200 PT Individual Time Calculation (min): 60 min   Short Term Goals: Week 1:  PT Short Term Goal 1 (Week 1): Pt will perform bed mobility minA PT Short Term Goal 1 - Progress (Week 1): Progressing toward goal PT Short Term Goal 2 (Week 1): Pt will perform transfers consistent minA PT Short Term Goal 2 - Progress (Week 1): Met PT Short Term Goal 3 (Week 1): Pt will ambulate maxA +1 PT Short Term Goal 3 - Progress (Week 1): Met PT Short Term Goal 4 (Week 1): Pt will initiate stair training PT Short Term Goal 4 - Progress (Week 1): Met PT Short Term Goal 5 (Week 1): Pt will demonstrate OOB tolerance 3 hrs/day outside of therapy PT Short Term Goal 5 - Progress (Week 1): Met  Skilled Therapeutic Interventions/Progress Updates:  Pt was seen bedside in the am. Pt transferred supine to edge of bed with no assist and increased time. Pt transferred edge of bed to w/c with S. Pt propelled w/c 150 feet x 2 with B LEs for LE strengthening. Pt performed multiple sit to stand transfers with S. Pt performed cone taps and alternating cone taps 3 sets x 10 reps each for NMR. Pt ambulated 150 feet with no assistive device and S. Pt rode Nu step 10 minutes, 4 minutes at level 8, 2 minutes at level 6 and 4 minutes at level 4. Pt returned to room following treatment and left sitting up in w/c with call bell within reach.   Therapy Documentation Precautions:  Precautions Precautions: Cervical Required Braces or Orthoses: Cervical Brace Cervical Brace: Hard collar Restrictions Weight Bearing Restrictions: No Other Position/Activity Restrictions: Pt with very weak core General:   Vital Signs:   Pain: Pt c/o 8/10 neck pain, medicated prior to treatment.   See Function Navigator for Current Functional Status.   Therapy/Group: Individual  Therapy  [name] 09/24/2016, 1:20 PM

## 2016-09-24 NOTE — Progress Notes (Signed)
Patrick Romero is a 56 y.o. male 01-25-1961 [id number]  Subjective: No new complaints. No new problems. Slept well. Feeling OK and optimistic  Objective: Vital signs in last 24 hours: Temp:  [97.4 F (36.3 C)-98.3 F (36.8 C)] 97.4 F (36.3 C) (03/17 0515) Pulse Rate:  [77-99] 77 (03/17 0515) Resp:  [18] 18 (03/17 0515) BP: (102-113)/(63-73) 102/63 (03/17 0515) SpO2:  [98 %-99 %] 98 % (03/17 0515) Weight change:  Last BM Date: 09/22/16  Intake/Output from previous day: 03/16 0701 - 03/17 0700 In: 320 [P.O.:320] Out: -   Physical Exam General: No apparent distress   C collar in place, poor oral care Lungs: Normal effort. Lungs clear to auscultation ant, no crackles or wheezes. Cardiovascular: Regular rate and rhythm, no edema Neurological: No new neurological deficits: RUE most affected 2/5 with diminished strength and coordination distally>proximally; also RLE 3/5, LUE 3+/5 and LLE 4/5   Lab Results: BMET    Component Value Date/Time   NA 137 09/23/2016 0537   K 4.3 09/23/2016 0537   CL 99 (L) 09/23/2016 0537   CO2 29 09/23/2016 0537   GLUCOSE 94 09/23/2016 0537   BUN 24 (H) 09/23/2016 0537   CREATININE 0.91 09/23/2016 0537   CALCIUM 9.7 09/23/2016 0537   GFRNONAA >60 09/23/2016 0537   GFRAA >60 09/23/2016 0537   CBC    Component Value Date/Time   WBC 6.2 09/23/2016 0537   RBC 3.56 (L) 09/23/2016 0537   HGB 9.0 (L) 09/23/2016 0537   HCT 29.0 (L) 09/23/2016 0537   PLT 408 (H) 09/23/2016 0537   MCV 81.5 09/23/2016 0537   MCH 25.3 (L) 09/23/2016 0537   MCHC 31.0 09/23/2016 0537   RDW 12.9 09/23/2016 0537   LYMPHSABS 2.2 09/06/2016 0732   MONOABS 0.9 09/06/2016 0732   EOSABS 0.3 09/06/2016 0732   BASOSABS 0.0 09/06/2016 0732   CBG's (last 3):  No results for input(s): GLUCAP in the last 72 hours. LFT's Lab Results  Component Value Date   ALT 49 09/06/2016   AST 54 (H) 09/06/2016   ALKPHOS 68 09/06/2016   BILITOT 0.7 09/06/2016     Studies/Results: No results found.  Medications:  I have reviewed the patient's current medications. Scheduled Medications: . bisacodyl  10 mg Rectal Daily  . doxycycline  100 mg Oral Q12H  . enoxaparin (LOVENOX) injection  40 mg Subcutaneous Q24H  . feeding supplement (PRO-STAT SUGAR FREE 64)  30 mL Oral BID  . multivitamin with minerals  1 tablet Oral Daily  . nystatin   Topical TID  . pantoprazole  40 mg Oral Daily  . polyethylene glycol  17 g Oral BID  . pregabalin  100 mg Oral TID  . thiamine  100 mg Oral Daily   PRN Medications: acetaminophen, alum & mag hydroxide-simeth, cyclobenzaprine, diphenhydrAMINE, guaiFENesin-dextromethorphan, menthol-cetylpyridinium, naphazoline-glycerin, oxyCODONE, prochlorperazine **OR** prochlorperazine **OR** prochlorperazine, sodium phosphate, traMADol, traZODone  Assessment/Plan: Principal Problem:   Cervical myelopathy with cervical radiculopathy Active Problems:   Neuropathic pain   History of syncope   Acute blood loss anemia   Dysesthesia  1. 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 Neck pain due to above - controlled with current meds 3. Anemia - for recheck Monday - HD stable and no reports clinically of blood loss 4. EtOH and tobacco abuse PTA  Length of stay, days: 19   Valerie A. Asa Lente, MD 09/24/2016, 11:09 AM

## 2016-09-25 ENCOUNTER — Inpatient Hospital Stay (HOSPITAL_COMMUNITY): Payer: Self-pay

## 2016-09-25 NOTE — Progress Notes (Signed)
Occupational Therapy Session Note  Patient Details  Name: Patrick Romero MRN: [id number] Date of Birth: [birth year]  Today's Date: 09/25/2016 OT Individual Time: 1351-1445 OT Individual Time Calculation (min): 54 min    Short Term Goals: Week 3:  OT Short Term Goal 1 (Week 3): STG=LTG due to LOS  Skilled Therapeutic Interventions/Progress Updates:    Pt supine in bed upon arrival, reporting pain in R shoulder but request to not report to RN d/t pain medication not available till after session. Pt dons B shoes with supervision and Vc to manage tongue of shoe with L hand. Pt stands at sink to wash face, brush teeth, and shave with increased time d/t decreased [place] of hand, razor getting clogged and increased time to manage shaving cream bottle. Pt ambulates to from all therapeutic destinations with supervision and VC for posture and clearing R foot from floor. Pt has no LOB this date. In therapy kitchen, educated pt on energy conservation strategies and techniques to use RUE as gross assist while cooking. Pt gathers utensils, ingredients and makes in instant pudding. With [facility] A to facilitate grasp of RUE, Pt stirs pudding and holds bowl with LUE. Pt transports cups of pudding to refrigerator and opens door with RUE. With cue from OT, Pt completes towel glides with rag to clean countertops with BUE to facilitate full ROM. Pt washes/dries/puts away dishes with supervision and VC to use RUE to open drawers and cabinets. Pt educated on supporting RUE on countertop instead of letting it dangle to prevent further subluxation. Pt returned to room seated in w/c with call light in place and all needs met.   Therapy Documentation Precautions:  Precautions Precautions: Cervical Required Braces or Orthoses: Cervical Brace Cervical Brace: Hard collar Restrictions Weight Bearing Restrictions: No Other Position/Activity Restrictions: Pt with very weak core  ADL: ADL ADL Comments: refer to functional  navigator Exercises:   Other Treatments:    See Function Navigator for Current Functional Status.   Therapy/Group: Individual Therapy  Tonny Branch 09/25/2016, 3:36 PM

## 2016-09-25 NOTE — Progress Notes (Signed)
Patrick Romero is a 56 y.o. male 07-May-1961 [id number]  Subjective: Denies problems overnight - feels he is slowly improving and glad for the recovery "considering how it could have turned out"  Objective: Vital signs in last 24 hours: Temp:  [98 F (36.7 C)-98.7 F (37.1 C)] 98 F (36.7 C) (03/18 0444) Pulse Rate:  [80-93] 80 (03/18 0444) Resp:  [18] 18 (03/18 0444) BP: (100-102)/(60-62) 100/60 (03/18 0444) SpO2:  [98 %-99 %] 99 % (03/18 0444) Weight change:  Last BM Date: 09/24/16  Intake/Output from previous day: 03/17 0701 - 03/18 0700 In: 840 [P.O.:840] Out: -   Physical Exam General: NAD, awake and conversational Lungs: CTA w/o W/C Cardiovascular: RRR, no edema BLE Neurological: No new neurological deficits: RUE most affected 2/5 with diminished strength and coordination distally>proximally; also RLE 3/5, LUE 3+/5 and LLE 4/5   Lab Results: BMET    Component Value Date/Time   NA 137 09/23/2016 0537   K 4.3 09/23/2016 0537   CL 99 (L) 09/23/2016 0537   CO2 29 09/23/2016 0537   GLUCOSE 94 09/23/2016 0537   BUN 24 (H) 09/23/2016 0537   CREATININE 0.91 09/23/2016 0537   CALCIUM 9.7 09/23/2016 0537   GFRNONAA >60 09/23/2016 0537   GFRAA >60 09/23/2016 0537   CBC    Component Value Date/Time   WBC 6.2 09/23/2016 0537   RBC 3.56 (L) 09/23/2016 0537   HGB 9.0 (L) 09/23/2016 0537   HCT 29.0 (L) 09/23/2016 0537   PLT 408 (H) 09/23/2016 0537   MCV 81.5 09/23/2016 0537   MCH 25.3 (L) 09/23/2016 0537   MCHC 31.0 09/23/2016 0537   RDW 12.9 09/23/2016 0537   LYMPHSABS 2.2 09/06/2016 0732   MONOABS 0.9 09/06/2016 0732   EOSABS 0.3 09/06/2016 0732   BASOSABS 0.0 09/06/2016 0732   CBG's (last 3):  No results for input(s): GLUCAP in the last 72 hours. LFT's Lab Results  Component Value Date   ALT 49 09/06/2016   AST 54 (H) 09/06/2016   ALKPHOS 68 09/06/2016   BILITOT 0.7 09/06/2016    Studies/Results: No results found.  Medications:  I have reviewed  the patient's current medications. Scheduled Medications: . bisacodyl  10 mg Rectal Daily  . doxycycline  100 mg Oral Q12H  . enoxaparin (LOVENOX) injection  40 mg Subcutaneous Q24H  . feeding supplement (PRO-STAT SUGAR FREE 64)  30 mL Oral BID  . multivitamin with minerals  1 tablet Oral Daily  . nystatin   Topical TID  . pantoprazole  40 mg Oral Daily  . polyethylene glycol  17 g Oral BID  . pregabalin  100 mg Oral TID  . thiamine  100 mg Oral Daily   PRN Medications: acetaminophen, alum & mag hydroxide-simeth, cyclobenzaprine, diphenhydrAMINE, guaiFENesin-dextromethorphan, menthol-cetylpyridinium, naphazoline-glycerin, oxyCODONE, prochlorperazine **OR** prochlorperazine **OR** prochlorperazine, sodium phosphate, traMADol, traZODone  Assessment/Plan: Principal Problem:   Cervical myelopathy with cervical radiculopathy Active Problems:   Neuropathic pain   History of syncope   Acute blood loss anemia   Dysesthesia  1. 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 Neck pain due to above - controlled with current meds 3. Anemia - for recheck Monday - HD stable and no reports clinically of blood loss 4. EtOH and tobacco abuse PTA  Length of stay, days: 20   Alexandr Yaworski A. Asa Lente, MD 09/25/2016, 9:37 AM

## 2016-09-26 ENCOUNTER — Inpatient Hospital Stay (HOSPITAL_COMMUNITY): Payer: Medicaid Other | Admitting: Occupational Therapy

## 2016-09-26 ENCOUNTER — Inpatient Hospital Stay (HOSPITAL_COMMUNITY): Payer: Self-pay | Admitting: Physical Therapy

## 2016-09-26 LAB — CBC
HEMATOCRIT: 29.1 % — AB (ref 39.0–52.0)
Hemoglobin: 9.1 g/dL — ABNORMAL LOW (ref 13.0–17.0)
MCH: 25.3 pg — ABNORMAL LOW (ref 26.0–34.0)
MCHC: 31.3 g/dL (ref 30.0–36.0)
MCV: 81.1 fL (ref 78.0–100.0)
Platelets: 372 10*3/uL (ref 150–400)
RBC: 3.59 MIL/uL — AB (ref 4.22–5.81)
RDW: 12.8 % (ref 11.5–15.5)
WBC: 5.7 10*3/uL (ref 4.0–10.5)

## 2016-09-26 NOTE — Progress Notes (Signed)
Recreational Therapy Session Note  Patient Details  Name: AMIIR HECKARD MRN: [id number] Date of Birth: 23-Apr-1961 Today's Date: 09/26/2016  Pain: no c/o  Met with pt briefly during PT session to discuss community reintegration & pt agreeable to participate in an outing tomorrow to the grocery store where he completes his shopping.    Jerell Demery 09/26/2016, 2:28 PM

## 2016-09-26 NOTE — Progress Notes (Signed)
Subjective/Complaints: Up with PT. Feeling well. Bowels and bladder functioning well. Didn't need suppository today  ROS: pt denies nausea, vomiting, diarrhea, cough, shortness of breath or chest pain   Objective: Vital Signs: Blood pressure 107/68, pulse 87, temperature 98.4 F (36.9 C), temperature source Oral, resp. rate 18, weight 67 kg (147 lb 11.3 oz), SpO2 98 %. No results found. Results for orders placed or performed during the hospital encounter of 09/05/16 (from the past 72 hour(s))  CBC     Status: Abnormal   Collection Time: 09/26/16  6:18 AM  Result Value Ref Range   WBC 5.7 4.0 - 10.5 K/uL   RBC 3.59 (L) 4.22 - 5.81 MIL/uL   Hemoglobin 9.1 (L) 13.0 - 17.0 g/dL   HCT 29.1 (L) 39.0 - 52.0 %   MCV 81.1 78.0 - 100.0 fL   MCH 25.3 (L) 26.0 - 34.0 pg   MCHC 31.3 30.0 - 36.0 g/dL   RDW 12.8 11.5 - 15.5 %   Platelets 372 150 - 400 K/uL     HEENT: Poor dentition. Normocephalic. Atraumatic.  Cardio: RRR Neck: C-collar in place. fitting Resp: CTA B GI: BS NT Skin:   Intact. Warm and dry.  Neuro: Alert and oriented x 3- to 3/5 left deltoid, biceps, 2+ to 3- triceps, wrist extension 3/5, hand grip 3 to 3+/5 . 3-/5 right deltoid muscle, biceps,2 triceps, wrist extension, hand grip   2 to 2+/5  3+/5 at the ankle dorsiflexor, plantar flexor, 4   KE/HF Musc: no edema  Gen.: NAD.    Assessment/Plan: 1. 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 Physiatrist is providing close team supervision and 24 hour management of active medical problems listed below. Physiatrist and rehab team continue to assess barriers to discharge/monitor patient progress toward functional and medical goals. FIM: Function - Bathing Position: Shower Body parts bathed by patient: Right arm, Chest, Abdomen, Front perineal area, Buttocks, Right upper leg, Left upper leg, Right  lower leg, Left lower leg Body parts bathed by helper: Left arm, Back Assist Level: Touching or steadying assistance(Pt > 75%)  Function- Upper Body Dressing/Undressing What is the patient wearing?: Pull over shirt/dress Pull over shirt/dress - Perfomed by patient: Thread/unthread right sleeve, Thread/unthread left sleeve, Pull shirt over trunk, Put head through opening Pull over shirt/dress - Perfomed by helper: Put head through opening Assist Level: Set up Set up : To obtain clothing/put away Function - Lower Body Dressing/Undressing What is the patient wearing?: Ted Hose, Shoes, Pants Position: Sitting EOB Pants- Performed by patient: Thread/unthread right pants leg, Thread/unthread left pants leg, Pull pants up/down Pants- Performed by helper: Pull pants up/down Non-skid slipper socks- Performed by patient: Don/doff right sock, Don/doff left sock Non-skid slipper socks- Performed by helper: Don/doff right sock, Don/doff left sock Shoes - Performed by patient: Don/doff right shoe, Don/doff left shoe Shoes - Performed by helper: Fasten right, Fasten left TED Hose - Performed by helper: Don/doff right TED hose, Don/doff left TED hose Assist for footwear: Setup Assist for lower body dressing: Supervision or verbal cues  Function - Toileting Toileting activity did not occur: No continent bowel/bladder event (using urinal, no BM ) Toileting steps completed by patient: Adjust clothing prior to toileting, Performs perineal hygiene, Adjust clothing after toileting Toileting steps completed by helper: Adjust clothing prior to toileting, Adjust clothing after toileting Toileting Assistive Devices: Grab bar or rail Assist level: Supervision or  verbal cues  Function - Air cabin crew transfer assistive device: Walker Assist level to toilet: Supervision or verbal cues Assist level from toilet: Supervision or verbal cues  Function - Chair/bed transfer Chair/bed transfer method:  Ambulatory Chair/bed transfer assist level: Supervision or verbal cues Chair/bed transfer assistive device: Armrests Chair/bed transfer details: Verbal cues for precautions/safety  Function - Locomotion: Wheelchair Type: Manual Max wheelchair distance: 150 Assist Level: Supervision or verbal cues Assist Level: Supervision or verbal cues Assist Level: Supervision or verbal cues Function - Locomotion: Ambulation Assistive device: No device Max distance: 200 Assist level: Supervision or verbal cues Assist level: Supervision or verbal cues Assist level: Supervision or verbal cues Assist level: Supervision or verbal cues Assist level: Touching or steadying assistance (Pt > 75%)  Function - Comprehension Comprehension: Auditory Comprehension assist level: Follows complex conversation/direction with no assist  Function - Expression Expression: Verbal Expression assist level: Expresses complex ideas: With extra time/assistive device  Function - Social Interaction Social Interaction assist level: Interacts appropriately with others - No medications needed.  Function - Problem Solving Problem solving assist level: Solves complex problems: With extra time  Function - Memory Memory assist level: Complete Independence: No helper Patient normally able to recall (first 3 days only): That he or she is in a hospital, Staff names and faces Medical Problem List and Plan: 1.  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  Continue CIR- PT, OT  -continued neurological recovery 2.  DVT Prophylaxis/Anticoagulation: Pharmaceutical: Lovenox 3. Pain Management: Continue oxycodone prn for now.  Added ultram for moderate pain. Dysesthesias managed on current dose lyrica.  4. Mood: LCSW to follow for evaluation and support.  5. Neuropsych: This patient is capable of making decisions on his own behalf. 6. Skin/Wound Care:  Air mattress overlay. Pressure relief measures.  7.  Fluids/Electrolytes/Nutrition:     -BUN slowly improving (24 today)       Component Value Date/Time   NA 137 09/23/2016 0537   K 4.3 09/23/2016 0537   CL 99 (L) 09/23/2016 0537   CO2 29 09/23/2016 0537   GLUCOSE 94 09/23/2016 0537   BUN 24 (H) 09/23/2016 0537   CREATININE 0.91 09/23/2016 0537   CALCIUM 9.7 09/23/2016 0537   GFRNONAA >60 09/23/2016 0537   GFRAA >60 09/23/2016 0537   8. History of presyncope/syncope: Monitor for now.Unclear whether that the orthostatic changes are separate issue,Patient has had no orthostatic drops since using the TED hose and abdominal binder 9. Anemia: question of chronic disease due to Alcohol use.Normocytic   -small amount of blood in stool Wednesday. No further issues clinically  -hgb 9.1 stabilized at 9.1 today----follow up as outpt  I personally reviewed the patient's labs today.      Component Value Date/Time   WBC 5.7 09/26/2016 0618   RBC 3.59 (L) 09/26/2016 0618   HGB 9.1 (L) 09/26/2016 0618   HCT 29.1 (L) 09/26/2016 0618   PLT 372 09/26/2016 0618   MCV 81.1 09/26/2016 0618   MCH 25.3 (L) 09/26/2016 0618   MCHC 31.3 09/26/2016 0618   RDW 12.8 09/26/2016 0618   LYMPHSABS 2.2 09/06/2016 0732   MONOABS 0.9 09/06/2016 0732   EOSABS 0.3 09/06/2016 0732   BASOSABS 0.0 09/06/2016 0732   10. Mid chest wall pain:  Resolved    11. Right renal cyst: Will need non-emergent follow up ultrasound for work up.  12. ETOH abuse/Tobacco: Counsel as appropriate  LOS (Days) 21 A FACE TO FACE EVALUATION WAS PERFORMED  Alexianna Nachreiner T  09/26/2016, 9:08 AM

## 2016-09-26 NOTE — Progress Notes (Signed)
Occupational Therapy Session Note  Patient Details  Name: LAYMON STOCKERT MRN: [id number] Date of Birth: 08/14/1960  Today's Date: 09/26/2016 OT Individual Time: 0301-3143 OT Individual Time Calculation (min): 60 min    Short Term Goals: Week 3:  OT Short Term Goal 1 (Week 3): STG=LTG due to LOS  Skilled Therapeutic Interventions/Progress Updates:    Pt seen for OT session focusing on UE use and neuro re-ed. Pt sitting up in w/c upon arrival, agreeable to tx session. He desired to have cervical brace pads to be changed. He ambulated to therapy gym with close supervision. Returned to supine position and brace pads changed total A.  He sat EOM and educated regarding retro grade massage for L UE as pt with increased edema in R hand. Educated regarding need to keep extremity elevated.  He then completed table top activity working on gross grasping and ROM picking up small empty cups. Pt with increased success with [place] placed around cup due to decreased sensation and grasp strength in R hand.  E-Stim completed for R shoulder, alternating 10 seconds on ten seconds off, power at 25- pt tolerated well with no adverse effects. Education and demonstration provided regarding self ROM activities for B UEs, will provide handout at future session.  Pt ambulated back to room with close supervision, left seated in w/c, all needs in reach.   Therapy Documentation Precautions:  Precautions Precautions: Cervical Required Braces or Orthoses: Cervical Brace Cervical Brace: Hard collar Restrictions Weight Bearing Restrictions: No Other Position/Activity Restrictions: Pt with very weak core Pain: Pain Assessment Pain Assessment: 0-10 Pain Score: No/ denies pain ADL: ADL ADL Comments: refer to functional navigator  See Function Navigator for Current Functional Status.   Therapy/Group: Individual Therapy  [name], Dawt Reeb C 09/26/2016, 7:13 AM

## 2016-09-26 NOTE — Progress Notes (Signed)
Refused AM supp. Reports having good BM's without supp. Bed alarm in place, patient attempting to get OOB without assistance. Reminded to call, requesting staff to turn alarms off. Will continue to monitor. Patrici Ranks A

## 2016-09-26 NOTE — Progress Notes (Signed)
Occupational Therapy Session Note  Patient Details  Name: Patrick Romero MRN: [id number] Date of Birth: 08-27-1960  Today's Date: 09/26/2016 OT Individual Time: 1515-1600 OT Individual Time Calculation (min): 45 min    Short Term Goals: Week 3:  OT Short Term Goal 1 (Week 3): STG=LTG due to LOS  Skilled Therapeutic Interventions/Progress Updates:    Upon entering the room, pt reports feeling very fatigued from prior OT sessions but agreeable to OT intervention. Pt engaged in coordination task with R UE. Pt grasping medium sized frisbee ring and reaching forwards to place onto top of cone just out of reach. Pt utilized L UE to assist with R UE forward reaching. OT also discussing goals and purpose of community outing tomorrow with pt verbalizing understanding. Pt also reports not sleeping well as he finds himself sleeping on R affected shoulder. OT positioned pt in L side lying position with pillows for proper positioning. OT notified RN and placed paper handout with instructions over bed. Pt reports feeling very comfortable and supported in this position. He remains in bed with call bell and all needed items within reach upon exiting the room.   Therapy Documentation Precautions:  Precautions Precautions: Cervical Required Braces or Orthoses: Cervical Brace Cervical Brace: Hard collar Restrictions Weight Bearing Restrictions: No Other Position/Activity Restrictions: Pt with very weak core General:   Vital Signs: Therapy Vitals Temp: 98.4 F (36.9 C) Temp Source: Oral Pulse Rate: (!) 106 Resp: 20 BP: 107/64 Patient Position (if appropriate): Sitting Oxygen Therapy SpO2: 96 % O2 Device: Not Delivered Pain: Pain Assessment Pain Assessment: 0-10 Pain Score: 4  Pain Type: Acute pain Pain Location: Shoulder Pain Orientation: Right;Left Pain Descriptors / Indicators: Aching Pain Frequency: Intermittent Pain Onset: On-going Patients Stated Pain Goal: 4 Pain Intervention(s):  Medication (See eMAR) ADL: ADL ADL Comments: refer to functional navigator Exercises:   Other Treatments:    See Function Navigator for Current Functional Status.   Therapy/Group: Individual Therapy  [name] 09/26/2016, 5:23 PM

## 2016-09-26 NOTE — Progress Notes (Signed)
Physical Therapy Session Note  Patient Details  Name: Patrick Romero MRN: [id number] Date of Birth: Apr 08, 1961  Today's Date: 09/26/2016 PT Individual Time: 0800-0900 and 1300-1412 PT Individual Time Calculation (min): 60 min and 72 min (total 132 min)   Short Term Goals: Week 3:  PT Short Term Goal 1 (Week 3): =LTG due to estimated LOS  Skilled Therapeutic Interventions/Progress Updates: Tx 1: Pt received seated on EOB eating breakfast; c/o pain 6/10 in B shoulders as below and agreeable to treatment. Pt dons shirt and sweat pants with setupA with increased time. Ambulation within room to retrieve clothing items with S. Pt doffs non-skid socks and dons shoes with S, TEDs donned totalA. Grooming and oral hygiene performed in standing at sink with S and increased time. Gait to gym with no AD and S. Pt reports stretching RLE frequently over the weekend, reports it helps with cramping. Nustep x10 min with BUE/BLE; trial first 1.5 min with no RUE wrist splint, however with fatigue pt requires splint donned to A with gripping handle. Pt performs RLE stretches hamstring, glute max, hip flexor with min cues for frequency/duration. Prone lying propped on elbows x5 min for RUE weight bearing; pt reports discomfort initially however improved over time. Returned to room gait with S. Remained seated in w/c at end of session, all needs in reach, RUE elevated on pillow d/t observed minimal swelling in hand.   Tx 2: Pt received seated in w/c, denies pain and agreeable to treatment. Gait within hall with S overall while including cognitive distractions and direction changes. Stairs 1x12 with B handrails and S. Lateral step ups 2x20 BLE; forward step ups RLE with slow concentric/eccentric control and cues to reduce genu recurvatum in stance. Gait indoors/outdoors on level/unlevel surfaces with close S overall, >500' before requiring seated rest break. Nustep x8 min with BUE/BLE on level 6 for BLE coordination and NMR.  Discussed with pt and recreational therapist about community outing tomorrow to grocery store. Returned to room at end of session with S ambulation. Remained seated in w/c at end of session, all needs in reach and family present.       Therapy Documentation Precautions:  Precautions Precautions: Cervical Required Braces or Orthoses: Cervical Brace Cervical Brace: Hard collar Restrictions Weight Bearing Restrictions: No Other Position/Activity Restrictions: Pt with very weak core Pain: Pain Assessment Pain Assessment: 0-10 Pain Score: 6  Pain Type: Acute pain Pain Location: Shoulder Pain Orientation: Right;Left Pain Descriptors / Indicators: Aching;Sore Pain Onset: On-going Patients Stated Pain Goal: 3 Pain Intervention(s): Other (Comment) (pre-medicated)   See Function Navigator for Current Functional Status.   Therapy/Group: Individual Therapy  Luberta Mutter 09/26/2016, 8:52 AM

## 2016-09-27 ENCOUNTER — Inpatient Hospital Stay (HOSPITAL_COMMUNITY): Payer: Self-pay | Admitting: Physical Therapy

## 2016-09-27 ENCOUNTER — Inpatient Hospital Stay (HOSPITAL_COMMUNITY): Payer: Self-pay | Admitting: Occupational Therapy

## 2016-09-27 ENCOUNTER — Inpatient Hospital Stay (HOSPITAL_COMMUNITY): Payer: Medicaid Other | Admitting: *Deleted

## 2016-09-27 NOTE — Progress Notes (Signed)
Subjective/Complaints: Feeding himself at EOB. No new complaints.   ROS: pt denies nausea, vomiting, diarrhea, cough, shortness of breath or chest pain   Objective: Vital Signs: Blood pressure 117/75, pulse 91, temperature 98.5 F (36.9 C), temperature source Oral, resp. rate 20, weight 67 kg (147 lb 11.3 oz), SpO2 98 %. No results found. Results for orders placed or performed during the hospital encounter of 09/05/16 (from the past 72 hour(s))  CBC     Status: Abnormal   Collection Time: 09/26/16  6:18 AM  Result Value Ref Range   WBC 5.7 4.0 - 10.5 K/uL   RBC 3.59 (L) 4.22 - 5.81 MIL/uL   Hemoglobin 9.1 (L) 13.0 - 17.0 g/dL   HCT 29.1 (L) 39.0 - 52.0 %   MCV 81.1 78.0 - 100.0 fL   MCH 25.3 (L) 26.0 - 34.0 pg   MCHC 31.3 30.0 - 36.0 g/dL   RDW 12.8 11.5 - 15.5 %   Platelets 372 150 - 400 K/uL     HEENT: Poor dentition. Normocephalic. Atraumatic.  Cardio: RRR Neck: C-collar in place. fitting Resp: CTA B GI: BS NT Skin:   CDI  Neuro: Alert and oriented x  3+ to 4-/5 left deltoid, biceps,3+ triceps, wrist extension 3+/5, hand grip  3+/5 . 3-/5 right deltoid muscle, biceps,3 triceps, wrist extension, hand grip    2+ to 3-/5  3+/5 at the ankle dorsiflexor, plantar flexor, 4   KE/HF Musc: no edema  Gen.: NAD.    Assessment/Plan: 1. 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 Physiatrist is providing close team supervision and 24 hour management of active medical problems listed below. Physiatrist and rehab team continue to assess barriers to discharge/monitor patient progress toward functional and medical goals. FIM: Function - Bathing Position: Shower Body parts bathed by patient: Right arm, Chest, Abdomen, Front perineal area, Buttocks, Right upper leg, Left upper leg, Right lower leg, Left lower leg Body parts bathed by helper: Left arm,  Back Assist Level: Touching or steadying assistance(Pt > 75%)  Function- Upper Body Dressing/Undressing What is the patient wearing?: Pull over shirt/dress Pull over shirt/dress - Perfomed by patient: Thread/unthread right sleeve, Thread/unthread left sleeve, Pull shirt over trunk, Put head through opening Pull over shirt/dress - Perfomed by helper: Put head through opening Assist Level: Set up Set up : To obtain clothing/put away Function - Lower Body Dressing/Undressing What is the patient wearing?: Ted Hose, Shoes, Pants Position: Sitting EOB Pants- Performed by patient: Thread/unthread right pants leg, Thread/unthread left pants leg, Pull pants up/down Pants- Performed by helper: Pull pants up/down Non-skid slipper socks- Performed by patient: Don/doff right sock, Don/doff left sock Non-skid slipper socks- Performed by helper: Don/doff right sock, Don/doff left sock Shoes - Performed by patient: Don/doff right shoe, Don/doff left shoe Shoes - Performed by helper: Fasten right, Fasten left TED Hose - Performed by helper: Don/doff right TED hose, Don/doff left TED hose Assist for footwear: Setup Assist for lower body dressing: Supervision or verbal cues  Function - Toileting Toileting activity did not occur: No continent bowel/bladder event (using urinal, no BM ) Toileting steps completed by patient: Adjust clothing prior to toileting, Performs perineal hygiene, Adjust clothing after toileting Toileting steps completed by helper: Adjust clothing prior to toileting, Adjust clothing after toileting Toileting Assistive Devices: Grab bar or rail Assist level: Touching or steadying assistance (Pt.75%)  Function - Air cabin crew transfer assistive  device: Grab bar Assist level to toilet: Supervision or verbal cues Assist level from toilet: Supervision or verbal cues  Function - Chair/bed transfer Chair/bed transfer method: Ambulatory Chair/bed transfer assist level:  Supervision or verbal cues Chair/bed transfer assistive device: Armrests Chair/bed transfer details: Verbal cues for precautions/safety  Function - Locomotion: Wheelchair Type: Manual Max wheelchair distance: 150 Assist Level: Supervision or verbal cues Assist Level: Supervision or verbal cues Assist Level: Supervision or verbal cues Function - Locomotion: Ambulation Assistive device: No device Max distance: 500 Assist level: Supervision or verbal cues Assist level: Supervision or verbal cues Assist level: Supervision or verbal cues Assist level: Supervision or verbal cues Assist level: Supervision or verbal cues  Function - Comprehension Comprehension: Auditory Comprehension assist level: Follows complex conversation/direction with no assist  Function - Expression Expression: Verbal Expression assist level: Expresses complex ideas: With extra time/assistive device  Function - Social Interaction Social Interaction assist level: Interacts appropriately with others - No medications needed.  Function - Problem Solving Problem solving assist level: Solves complex problems: With extra time  Function - Memory Memory assist level: Complete Independence: No helper Patient normally able to recall (first 3 days only): That he or she is in a hospital, Staff names and faces Medical Problem List and Plan: 1.  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  Continue CIR- PT, OT  -team conference today  -encouraged him to engage right hand given increasing motor function 2.  DVT Prophylaxis/Anticoagulation: Pharmaceutical: Lovenox 3. Pain Management: Continue oxycodone prn for now.  Added ultram for moderate pain. Dysesthesias managed on current dose lyrica.  4. Mood: LCSW to follow for evaluation and support.  5. Neuropsych: This patient is capable of making decisions on his own behalf. 6. Skin/Wound Care:  Air mattress overlay. Pressure relief measures.  7.  Fluids/Electrolytes/Nutrition:     -BUN slowly improving--encourage PO       Component Value Date/Time   NA 137 09/23/2016 0537   K 4.3 09/23/2016 0537   CL 99 (L) 09/23/2016 0537   CO2 29 09/23/2016 0537   GLUCOSE 94 09/23/2016 0537   BUN 24 (H) 09/23/2016 0537   CREATININE 0.91 09/23/2016 0537   CALCIUM 9.7 09/23/2016 0537   GFRNONAA >60 09/23/2016 0537   GFRAA >60 09/23/2016 0537   8. History of presyncope/syncope: Monitor for now.Unclear whether that the orthostatic changes are separate issue,Patient has had no orthostatic drops since using the TED hose and abdominal binder 9. Anemia: question of chronic disease due to Alcohol use.Normocytic   -small amount of blood in stool Wednesday. No further issues clinically  -hgb 9.1 stabilized at 9.1 today----follow up as outpt   .      Component Value Date/Time   WBC 5.7 09/26/2016 0618   RBC 3.59 (L) 09/26/2016 0618   HGB 9.1 (L) 09/26/2016 0618   HCT 29.1 (L) 09/26/2016 0618   PLT 372 09/26/2016 0618   MCV 81.1 09/26/2016 0618   MCH 25.3 (L) 09/26/2016 0618   MCHC 31.3 09/26/2016 0618   RDW 12.8 09/26/2016 0618   LYMPHSABS 2.2 09/06/2016 0732   MONOABS 0.9 09/06/2016 0732   EOSABS 0.3 09/06/2016 0732   BASOSABS 0.0 09/06/2016 0732   10. Mid chest wall pain:  Resolved    11. Right renal cyst: Will need non-emergent follow up ultrasound for work up.  12. ETOH abuse/Tobacco: Counsel as appropriate  LOS (Days) 22 A FACE TO FACE EVALUATION WAS PERFORMED  Denvil Canning T 09/27/2016, 9:02 AM

## 2016-09-27 NOTE — Progress Notes (Signed)
Physical Therapy Session Note  Patient Details  Name: Patrick Romero MRN: [id number] Date of Birth: 07/29/1960  Today's Date: 09/27/2016 PT Individual Time: 1430-1530 PT Individual Time Calculation (min): 60 min   Short Term Goals: Week 3:  PT Short Term Goal 1 (Week 3): =LTG due to estimated LOS  Skilled Therapeutic Interventions/Progress Updates:    no c/o pain.  Session focus on tone management via stretching, dynamic balance, and NMR.    Pt ambulates throughout unit and transfers with distant supervision.  PT performs PROM stretching to hip flexors and quads in prone, proximal/distal hamstrings, glutes, sartorius, and piriformis in supine all 3x30 for RLE.  High level gait training through obstacle course x4 passes focus on compliant surfaces and stepping over/around obstacles.  Pt completes obstacle course with supervision only.  PT administered regular TUG (avg 11.16 seconds) and cognitive TUG (25.95) with significant difficulty counting backwards by 3s and 4s.  Pt only able to count to 88 by 3s on first attempt, 96 by 4s on second attempt, and 97 by 3s on first attempt and required cues all three trials to sit down to stop timer.  Nustep x10 minutes with 4 extremities (RUE hand splint) at level 6 for strengthening and activity tolerance as well as reciprocal stepping pattern retraining.  Pt returned to room at end of session and left with call bell in reach and needs met.   Therapy Documentation Precautions:  Precautions Precautions: Cervical Required Braces or Orthoses: Cervical Brace Cervical Brace: Hard collar Restrictions Weight Bearing Restrictions: No Other Position/Activity Restrictions: Pt with very weak core   See Function Navigator for Current Functional Status.   Therapy/Group: Individual Therapy  Earnest Conroy Penven-Crew 09/27/2016, 3:18 PM

## 2016-09-27 NOTE — Progress Notes (Signed)
Occupational Therapy Session Note  Patient Details  Name: Patrick Romero MRN: [id number] Date of Birth: May 15, 1961  Today's Date: 09/27/2016 OT Individual Time: 0900-1000 OT Individual Time Calculation (min): 60 min    Short Term Goals: Week 3:  OT Short Term Goal 1 (Week 3): STG=LTG due to LOS  Skilled Therapeutic Interventions/Progress Updates:    Pt seen for OT ADL bathing/dressing session. Pt in supine upon arrival, agreeable to tx session, rated pain as "manageable" denying need for pain meds. Pt agreeable to bathing session. He ambulated throughout room with close supervision to gather clothing items, spontaneously using weaker R UE to assist with managing drawers and picking up clothing. He bathed seated on tub bench, hand over hand provided for washing L UE with R hand due to decreased ROM, pt able to grasp wash cloth with each hand. With VCs, pt able to complete lateral leans for buttock hygiene. He dressed seated on toilet, standing to pull pants up, Returned to bed for cervical brace pads to be changed total A.  Discussed at length d/c planning and pt voiced feeling comfortable and confident with planned d/c home at end of week. Plans to go to mothers house for first week or two to get "re-acclimated".  He ambulated throughout unit, required to attend to signs on wall to ffind letters focusing on dual task while ambulating, occasional min A for balance. He then completed side stepping task, noted scissoring when leading with L LE, overall CGA completed one trial leading with each side ~25 feet. Following seated rest break, pt bounced basketball with L UE while ambulating, requiring heavy steadying assist and unable to maintain dribble. Pt able to reach to floor to retrieve ball with assist.  Pt left seated in w/c at end of session, all needs in reach.   Therapy Documentation Precautions:  Precautions Precautions: Cervical Required Braces or Orthoses: Cervical Brace Cervical  Brace: Hard collar Restrictions Weight Bearing Restrictions: No Other Position/Activity Restrictions: Pt with very weak core ADL: ADL ADL Comments: refer to functional navigator  See Function Navigator for Current Functional Status.   Therapy/Group: Individual Therapy  [name], Archit Leger C 09/27/2016, 7:09 AM

## 2016-09-27 NOTE — Progress Notes (Signed)
Physical Therapy Session Note  Patient Details  Name: Patrick Romero MRN: [id number] Date of Birth: 1960/09/25  Today's Date: 09/27/2016 PT Individual Time: 1030-1100 and 1330-1400 PT Individual Time Calculation (min): 30 min and 30 min (total 60 min)   Short Term Goals: Week 3:  PT Short Term Goal 1 (Week 3): =LTG due to estimated LOS  Skilled Therapeutic Interventions/Progress Updates: Tx 1: Pt received seated in w/c, denies pain and agreeable to treatment. Gait to gym x150' with S. Floor transfer performed with close S overall; required several attempts and min cues for problem solving and technique due to RUE strength/coordination deficits. Tall kneeling side stepping R/L 6x6' each direction for RLE weight bearing and BLE coordination. Semi-reclined situps x10 reps. Semi-reclined RUE self AAROM shoulder flexion. Prone lying RLE quad stretch x2 min. Gait x90' with S overall, continued R foot drag. Remained seated on edge of mat table to handoff to next therapist at end of session.   Tx 2: Pt received supine in bed, denies pain and agreeable to treatment. Pt dons shoes modI with increased time on EOB. Gait to gym with S overall. Assessed Berg as below with score indicative of continued high risk for falls and recommendation that pt have S at home, avoid cognitive distractions while performing mobility tasks. Performed stair ascent/descent in stairwell with close S; required cues for use of L rail and not RUE. Returned to room gait with S. Remained seated on EOB at end of session, all needs in reach.      Therapy Documentation Precautions:  Precautions Precautions: Cervical Required Braces or Orthoses: Cervical Brace Cervical Brace: Hard collar Restrictions Weight Bearing Restrictions: No Other Position/Activity Restrictions: Pt with very weak core  Balance Balance Assessed: Yes Standardized Balance Assessment Standardized Balance Assessment: Berg Balance Test Berg Balance Test Sit  to Stand: Able to stand without using hands and stabilize independently Standing Unsupported: Able to stand safely 2 minutes Sitting with Back Unsupported but Feet Supported on Floor or Stool: Able to sit safely and securely 2 minutes Stand to Sit: Sits safely with minimal use of hands Transfers: Able to transfer safely, minor use of hands Standing Unsupported with Eyes Closed: Able to stand 10 seconds with supervision Standing Ubsupported with Feet Together: Able to place feet together independently and stand 1 minute safely From Standing, Reach Forward with Outstretched Arm: Can reach forward >5 cm safely (2") From Standing Position, Pick up Object from Floor: Able to pick up shoe, needs supervision From Standing Position, Turn to Look Behind Over each Shoulder: Looks behind one side only/other side shows less weight shift Turn 360 Degrees: Able to turn 360 degrees safely but slowly Standing Unsupported, Alternately Place Feet on Step/Stool: Able to stand independently and complete 8 steps >20 seconds Standing Unsupported, One Foot in Front: Able to place foot tandem independently and hold 30 seconds Standing on One Leg: Tries to lift leg/unable to hold 3 seconds but remains standing independently Total Score: 45   See Function Navigator for Current Functional Status.   Therapy/Group: Individual Therapy  Luberta Mutter 09/27/2016, 11:01 AM

## 2016-09-27 NOTE — Progress Notes (Signed)
Physical Therapy Session Note  Patient Details  Name: Patrick Romero MRN: [id number] Date of Birth: 14-Jun-1961  Today's Date: 09/27/2016 PT Individual Time: 1100-1200 PT Individual Time Calculation (min): 60 min   Short Term Goals: Week 3:  PT Short Term Goal 1 (Week 3): =LTG due to estimated LOS  Skilled Therapeutic Interventions/Progress Updates:    Session focused on neuro re-ed to address dynamic balance, postural control, increased WB and motor activation on RLE, and coordination using Biodex (limits of stability and random control on moveable surface level 7), Wii Fit balance board activities including hula hoop, ski slalom and table tilt, in standing on Kinetron without UE support ball toss and bounce while maintaining neutral weightbearing and correcting without UE support, and Nustep x 10 min on level 6 with RUE modified hand adaptive piece. Pt overall supervision to steadying assist during higher level activities. During gait training without AD, cues for increased hip flexion activation for improved gait pattern and foot clearance to decrease pt bias towards circumduction of RLE due to weakness. D/c planning and discussed and addressed during session as well.   Therapy Documentation Precautions:  Precautions Precautions: Cervical Required Braces or Orthoses: Cervical Brace Cervical Brace: Hard collar Restrictions Weight Bearing Restrictions: No Other Position/Activity Restrictions: Pt with very weak core    Pain: Denies pain.   See Function Navigator for Current Functional Status.   Therapy/Group: Individual Therapy  Canary Brim Ivory Broad, PT, DPT  09/27/2016, 12:06 PM

## 2016-09-28 ENCOUNTER — Inpatient Hospital Stay (HOSPITAL_COMMUNITY): Payer: Medicaid Other | Admitting: Occupational Therapy

## 2016-09-28 ENCOUNTER — Inpatient Hospital Stay (HOSPITAL_COMMUNITY): Payer: Self-pay

## 2016-09-28 ENCOUNTER — Inpatient Hospital Stay (HOSPITAL_COMMUNITY): Payer: Self-pay | Admitting: Physical Therapy

## 2016-09-28 NOTE — Progress Notes (Signed)
Physical Therapy Session Note  Patient Details  Name: Patrick Romero MRN: [id number] Date of Birth: 06-02-1961  Today's Date: 09/28/2016 PT Individual Time: 0800-0900 and 1405-1500 PT Individual Time Calculation (min): 60 min and 55 min (total 115 min)   Short Term Goals: Week 3:  PT Short Term Goal 1 (Week 3): =LTG due to estimated LOS  Skilled Therapeutic Interventions/Progress Updates: Tx 1: Pt received seated on EOB, denies pain and agreeable to treatment. Pt performed grooming with modI and gait within room with S to retrieve items. Gait to gym with S, occasional staggering, scissoring however recovers without assist; reports "it's early, I'm not warmed up". Nustep x10 min with BUE/BLE; performed first 2 min with RUE on handle without assist, then donned grip assist splint d/t fatigue and difficulty maintaining grip. Long sitting hamstring, hip ER, gastroc stretch with towel, prone quad stretch with minA to setup and maintain neutral LE position. Provided handout with new prone quad stretch for performance at home. Sidelying R shoulder ER with max tactile cueing to reduce trunk rotation. Eccentric shoulder adduction, extension from 90/90 position, cueing to reduce scapular elevation compensation. Gait to return to room with S; remained seated on EOB at end of session, all needs in reach.   Tx 2: Pt received seated in w/c in hallway after returning from being off unit; denies pain and agreeable to treatment. Stand pivot transfer w/c <>nustep with S. Performed nustep x10 min level 7 with BUE/BLE for strengthening and aerobic endurance. Stairs 1x16 on 6" height stairs with B handrails for RUE weight bearing and functional use; S overall. Standing balance on unlocked platform on biodex while playing catch game for ankle strategy, righting reactions, RLE weight bearing and ROM. Seated pipe tree with cues for using RUE as much as possible to retrieve and help place all pieces. 6 min walk test performed  with total 1129', decreased compared to age norms. Returned to room in w/c with friend pushing at end of session.       Therapy Documentation Precautions:  Precautions Precautions: Cervical Required Braces or Orthoses: Cervical Brace Cervical Brace: Hard collar Restrictions Weight Bearing Restrictions: No Other Position/Activity Restrictions: Pt with very weak core   See Function Navigator for Current Functional Status.   Therapy/Group: Individual Therapy  Luberta Mutter 09/28/2016, 9:01 AM

## 2016-09-28 NOTE — Progress Notes (Signed)
Occupational Therapy Session Note  Patient Details  Name: Patrick Romero MRN: [id number] Date of Birth: Dec 14, 1960  Today's Date: 09/28/2016 OT Individual Time: 8295-6213 OT Individual Time Calculation (min): 60 min    Short Term Goals: Week 3:  OT Short Term Goal 1 (Week 3): STG=LTG due to LOS  Skilled Therapeutic Interventions/Progress Updates:    Pt seen for OT session focusing on pain management with use of E-stim for R shoulder and establishing HEP. Pt ambulating in room independently upon arrival, reviewed with pt need for assist with all mobility and pt not allowed to ambulate independently within room.  He finished grooming tasks standing at sink and returned to EOB don shoes with increased time.  He ambulated to therapy gym with supervision. Seated on EOM, pt set-up on E-stim small muscle atrophy to address shoulder discomfort. Performed for 10 minutes with pt voicing decreased pain following intervention. Provided pt with handout of self ROM exercises for R UE. Reviewed, and demonstrated exercises with pt return demonstrated 10 reps of each exercise. He returned to supine on mat with pt with increased UE AROM with back supported from supine position.  Pt ambulated back to room at end of session, left seated EOB with all needs in reach.   Therapy Documentation Precautions:  Precautions Precautions: Cervical Required Braces or Orthoses: Cervical Brace Cervical Brace: Hard collar Restrictions Weight Bearing Restrictions: No Other Position/Activity Restrictions: Pt with very weak core ADL: ADL ADL Comments: refer to functional navigator  See Function Navigator for Current Functional Status.   Therapy/Group: Individual Therapy  [name], Ahnaf Caponi C 09/28/2016, 7:08 AM

## 2016-09-28 NOTE — Patient Care Conference (Signed)
Inpatient RehabilitationTeam Conference and Plan of Care Update Date: 09/27/2016   Time: 2:00 PM    Patient Name: Patrick Romero      Medical Record Number: [id number]  Date of Birth: 02-20-1961 Sex: Male         Room/Bed: 4W10C/4W10C-01 Payor Info: Payor: MEDICAID PENDING / Plan: MEDICAID PENDING / Product Type: *No Product type* /    Admitting Diagnosis: fall with incomplete SCI  Admit Date/Time:  09/05/2016  4:02 PM Admission Comments: No comment available   Primary Diagnosis:  Cervical myelopathy with cervical radiculopathy Principal Problem: Cervical myelopathy with cervical radiculopathy  Patient Active Problem List   Diagnosis Date Noted  . Acute blood loss anemia   . Dysesthesia   . History of syncope   . Cervical myelopathy with cervical radiculopathy 09/05/2016  . Tetraparesis ([place])   . Central cord syndrome ([place])   . Anemia   . Renal cyst   . ETOH abuse   . Chest wall pain   . Neuropathic pain   . Neck pain   . Chronic neck and back pain 09/01/2016  . Alcohol intoxication ([place]) 09/01/2016  . Tobacco abuse 09/01/2016  . Paraplegia following spinal cord injury ([place]) 09/01/2016    Expected Discharge Date: Expected Discharge Date: 09/30/16  Team Members Present: Physician leading conference: Dr. Janna Arch, PsyD Social Worker Present: Ovidio Kin, LCSW Nurse Present: Elliot Cousin, RN PT Present: Kem Parkinson, Rachel Moulds, PT OT Present: Napoleon Form, OT SLP Present: Stormy Fabian, SLP PPS Coordinator present : Daiva Nakayama, RN, CRRN     Current Status/Progress Goal Weekly Team Focus  Medical   improving RUE motor skills, bowel and bladder function normalizing  prepare medically for discharge  improve hydration. following hgb (no signs of acute blood loss)   Bowel/Bladder   Patient is continent of bladder. Last BM 3/19. Q am bowel program.  Remain continent of bladder, manage bowel with min assist.  Monitor for bowel and bladder  changes.   Swallow/Nutrition/ Hydration             ADL's   Supervision- min A overall  min A self care, S transfers  R UE neuro re-ed, d/c planning, family ed   Mobility   modI bed mobility, S/min guard dynamic gait in home/community and stairs  S overall including ambulatory in home  RLE/BUE NMR, dynamic balance and gait training, HEP and pt/family education to prepare for d/c    Communication             Safety/Cognition/ Behavioral Observations  Patient calls appropriately when needing assistance.   Supervision  Monitor for safety    Pain   Pain to bilateral shoulders. Oxycodone prn and Kpad in use.  Pain less than 2  Assess pain q shift and prn.    Skin   CDI  No skin breakdown while in rehab  Assess skin shift and prn    Rehab Goals Patient on target to meet rehab goals: Yes *See Care Plan and progress notes for long and short-term goals.  Barriers to Discharge: RUE>LUE weakness    Possible Resolutions to Barriers:  continued adaptive techniques and equipment, improving motor control helping also    Discharge Planning/Teaching Needs:  Pt to d/c to mother's home which is more accessible.  Family and girlfriend able to provide 24/7 assist.      Team Discussion:  Currently sup/min assist and progressing neurologically.  b/b good  - cont.  Mother and sister to provide  24/7 support and fam ed planned for Thursday.  No concerns.  Revisions to Treatment Plan:  None   Continued Need for Acute Rehabilitation Level of Care: The patient requires daily medical management by a physician with specialized training in physical medicine and rehabilitation for the following conditions: Daily direction of a multidisciplinary physical rehabilitation program to ensure safe treatment while eliciting the highest outcome that is of practical value to the patient.: Yes Daily medical management of patient stability for increased activity during participation in an intensive rehabilitation  regime.: Yes Daily analysis of laboratory values and/or radiology reports with any subsequent need for medication adjustment of medical intervention for : Post surgical problems;Neurological problems  Patrick Romero 09/28/2016, 7:46 AM

## 2016-09-28 NOTE — Progress Notes (Signed)
Subjective/Complaints: Notes continued improvement in strength RUE. Still hesitant to use it for feeding  ROS: pt denies nausea, vomiting, diarrhea, cough, shortness of breath or chest pain    Objective: Vital Signs: Blood pressure 129/76, pulse 79, temperature 97.8 F (36.6 C), temperature source Oral, resp. rate 17, weight 67 kg (147 lb 12 oz), SpO2 100 %. No results found. Results for orders placed or performed during the hospital encounter of 09/05/16 (from the past 72 hour(s))  CBC     Status: Abnormal   Collection Time: 09/26/16  6:18 AM  Result Value Ref Range   WBC 5.7 4.0 - 10.5 K/uL   RBC 3.59 (L) 4.22 - 5.81 MIL/uL   Hemoglobin 9.1 (L) 13.0 - 17.0 g/dL   HCT 29.1 (L) 39.0 - 52.0 %   MCV 81.1 78.0 - 100.0 fL   MCH 25.3 (L) 26.0 - 34.0 pg   MCHC 31.3 30.0 - 36.0 g/dL   RDW 12.8 11.5 - 15.5 %   Platelets 372 150 - 400 K/uL     HEENT: Poor dentition. Normocephalic. Atraumatic.  Cardio: RRR Neck: C-collar in place. fitting Resp: CTA B GI: BS NT Skin:   CDI  Neuro: Alert and oriented x  3+ to 4-/5 left deltoid, biceps,3+ triceps, wrist extension 3+/5, hand grip  3+/5 . 3-/5 right deltoid muscle, biceps,3 triceps, wrist extension, hand grip      3- to 3/5  4-/5 at the ankle dorsiflexor, plantar flexor, 4   KE/HF Musc: no edema  Gen.: NAD.    Assessment/Plan: 1. 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 Physiatrist is providing close team supervision and 24 hour management of active medical problems listed below. Physiatrist and rehab team continue to assess barriers to discharge/monitor patient progress toward functional and medical goals. FIM: Function - Bathing Position: Shower Body parts bathed by patient: Right arm, Chest, Abdomen, Front perineal area, Buttocks, Right upper leg, Left upper leg, Right lower leg, Left lower leg Body  parts bathed by helper: Left arm (Hand over hand for washing L arm) Bathing not applicable: Back Assist Level: Touching or steadying assistance(Pt > 75%)  Function- Upper Body Dressing/Undressing What is the patient wearing?: Pull over shirt/dress Pull over shirt/dress - Perfomed by patient: Thread/unthread right sleeve, Thread/unthread left sleeve, Pull shirt over trunk, Put head through opening Pull over shirt/dress - Perfomed by helper: Put head through opening Assist Level: More than reasonable time Set up : To obtain clothing/put away Function - Lower Body Dressing/Undressing What is the patient wearing?: Shoes Position: Sitting EOB Pants- Performed by patient: Thread/unthread right pants leg, Thread/unthread left pants leg, Pull pants up/down Pants- Performed by helper: Pull pants up/down Non-skid slipper socks- Performed by patient: Don/doff right sock, Don/doff left sock Non-skid slipper socks- Performed by helper: Don/doff right sock, Don/doff left sock Shoes - Performed by patient: Don/doff right shoe, Don/doff left shoe Shoes - Performed by helper: Fasten right, Fasten left TED Hose - Performed by helper: Don/doff right TED hose, Don/doff left TED hose Assist for footwear: Setup Assist for lower body dressing: Supervision or verbal cues  Function - Toileting Toileting activity did not occur: No continent bowel/bladder event (using urinal, no BM ) Toileting steps completed by patient: Adjust clothing prior to toileting, Performs perineal hygiene, Adjust clothing after toileting Toileting steps completed by helper: Adjust clothing prior to toileting, Adjust clothing after toileting Toileting Assistive Devices: Grab bar  or rail Assist level: Supervision or verbal cues  Function - Air cabin crew transfer assistive device: Grab bar Assist level to toilet: Supervision or verbal cues Assist level from toilet: Supervision or verbal cues  Function - Chair/bed  transfer Chair/bed transfer method: Ambulatory Chair/bed transfer assist level: Supervision or verbal cues Chair/bed transfer assistive device: Armrests Chair/bed transfer details: Verbal cues for precautions/safety  Function - Locomotion: Wheelchair Type: Manual Max wheelchair distance: 150 Assist Level: Supervision or verbal cues Assist Level: Supervision or verbal cues Assist Level: Supervision or verbal cues Function - Locomotion: Ambulation Assistive device: No device Max distance: 150 Assist level: Supervision or verbal cues Assist level: Supervision or verbal cues Assist level: Supervision or verbal cues Assist level: Supervision or verbal cues Assist level: Supervision or verbal cues  Function - Comprehension Comprehension: Auditory Comprehension assist level: Follows complex conversation/direction with no assist  Function - Expression Expression: Verbal Expression assist level: Expresses complex ideas: With extra time/assistive device  Function - Social Interaction Social Interaction assist level: Interacts appropriately with others - No medications needed.  Function - Problem Solving Problem solving assist level: Solves complex problems: With extra time  Function - Memory Memory assist level: Assistive device: No helper Patient normally able to recall (first 3 days only): Staff names and faces, That he or she is in a hospital Medical Problem List and Plan: 1.  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  Continue CIR- PT, OT  -progressing nicely.  2.  DVT Prophylaxis/Anticoagulation: Pharmaceutical: Lovenox  -posterior tibial dvt---recheck dopplers tomorrow. Would not anticipate need for a/c once home 3. Pain Management: Continue oxycodone prn for now. Wean as able Added ultram for moderate pain. Dysesthesias managed on current dose lyrica.  4. Mood: LCSW to follow for evaluation and support.  5. Neuropsych: This patient is capable of making  decisions on his own behalf. 6. Skin/Wound Care:  Air mattress overlay. Pressure relief measures.  7. Fluids/Electrolytes/Nutrition:     -BUN slowly improving--encourage PO       Component Value Date/Time   NA 137 09/23/2016 0537   K 4.3 09/23/2016 0537   CL 99 (L) 09/23/2016 0537   CO2 29 09/23/2016 0537   GLUCOSE 94 09/23/2016 0537   BUN 24 (H) 09/23/2016 0537   CREATININE 0.91 09/23/2016 0537   CALCIUM 9.7 09/23/2016 0537   GFRNONAA >60 09/23/2016 0537   GFRAA >60 09/23/2016 0537   8. History of presyncope/syncope: Monitor for now.Unclear whether that the orthostatic changes are separate issue,Patient has had no orthostatic drops since using the TED hose and abdominal binder 9. Anemia: question of chronic disease due to Alcohol use.Normocytic   -small amount of blood in stool Wednesday. No further issues clinically  -hgb 9.1 stabilized at 9.1 today----follow up as outpt   .      Component Value Date/Time   WBC 5.7 09/26/2016 0618   RBC 3.59 (L) 09/26/2016 0618   HGB 9.1 (L) 09/26/2016 0618   HCT 29.1 (L) 09/26/2016 0618   PLT 372 09/26/2016 0618   MCV 81.1 09/26/2016 0618   MCH 25.3 (L) 09/26/2016 0618   MCHC 31.3 09/26/2016 0618   RDW 12.8 09/26/2016 0618   LYMPHSABS 2.2 09/06/2016 0732   MONOABS 0.9 09/06/2016 0732   EOSABS 0.3 09/06/2016 0732   BASOSABS 0.0 09/06/2016 0732   10. Mid chest wall pain:  Resolved    11. Right renal cyst: Will need non-emergent follow up ultrasound for work up.  12. ETOH abuse/Tobacco: Counsel as appropriate  LOS (Days) 23 A FACE TO FACE EVALUATION WAS PERFORMED  SWARTZ,ZACHARY T 09/28/2016, 5:10 PM

## 2016-09-28 NOTE — Progress Notes (Signed)
Physical Therapy Note  Patient Details  Name: Patrick Romero MRN: [id number] Date of Birth: Apr 02, 1961 Today's Date: 09/28/2016  1110-1205, 55 min individual tx Pain: 7/10 R shoulder, neck; will ask for meds after lunch  Gait on level tile throughout unit with supervision.  Gait with grocery cart focusing on better R foot clearance via higher hip flexion, with good results.  Therapeutic activity in standing, kicking beach ball and manipulating soccer ball with R foot, to break up extensor hypertonus, with good results with practice.  neuromuscular re-education via hand out for R/L heel cord and hamstring self stretching in standing, x 30 sec x 2; R/L quad stretch seated with foot underneath seat, x 2 minutes bil.  neuro re-ed via multimodal cues, demo RUE wt bearing for RUE activation during L hand reaching for objects. RUE isolated use, in  standing without leaning against mat (at chest height) to push bulky wedge forward o mat table x 10.  Gait to return to room, R hand in pocket for comfort, marching bil to increase R hip flexion for foot clearance.   Pt left resting sitting EOB, with all needs within reach, [facility] entering to give meds.  See function navigator  Cissy Galbreath 09/28/2016, 7:56 AM

## 2016-09-28 NOTE — Discharge Instructions (Signed)
Inpatient Rehab Discharge Instructions  Patrick Romero Discharge date and time:    Activities/Precautions/ Functional Status: Activity: no lifting, driving, or strenuous exercise for till cleared by MD. Diet: regular diet Wound Care: none needed   Functional status:  ___ No restrictions     ___ Walk up steps independently ___ 24/7 supervision/assistance   ___ Walk up steps with assistance ___ Intermittent supervision/assistance  ___ Bathe/dress independently ___ Walk with walker     ___ Bathe/dress with assistance ___ Walk Independently    ___ Shower independently ___ Walk with assistance    ___ Shower with assistance ___ No alcohol     ___ Return to work/school ________    COMMUNITY REFERRALS UPON DISCHARGE:    Home Health:   PT     OT    SW                    Agency:  [facility] Phone:  [phone]   Medical Equipment/Items Ordered:  Tub bench                                                       Agency/Supplier:  [facility] @ [phone]    Special Instructions: 1. Wear collar at all times.  2. Will need to follow up with primary MD for work up of renal cyst.    My questions have been answered and I understand these instructions. I will adhere to these goals and the provided educational materials after my discharge from the hospital.  Patient/Caregiver Signature _______________________________ Date __________  Clinician Signature _______________________________________ Date __________  Please bring this form and your medication list with you to all your follow-up doctor's appointments.

## 2016-09-28 NOTE — Discharge Summary (Signed)
Physician Discharge Summary  Patient ID: Patrick Romero MRN: [id number] DOB/AGE: March 03, 1961 56 y.o.  Admit date: 09/05/2016 Discharge date: 09/30/2016  Discharge Diagnoses:  Principal Problem:   Cervical myelopathy with cervical radiculopathy Active Problems:   Neuropathic pain   History of syncope   Acute blood loss anemia   Dysesthesia   Discharged Condition: stable   Significant Diagnostic Studies:   Labs:  Basic Metabolic Panel: BMP Latest Ref Rng & Units 09/23/2016 09/19/2016 09/15/2016  Glucose 65 - 99 mg/dL 94 92 86  BUN 6 - 20 mg/dL 24(H) 25(H) 30(H)  Creatinine 0.61 - 1.24 mg/dL 0.91 0.97 1.04  Sodium 135 - 145 mmol/L 137 135 134(L)  Potassium 3.5 - 5.1 mmol/L 4.3 4.2 4.4  Chloride 101 - 111 mmol/L 99(L) 100(L) 96(L)  CO2 22 - 32 mmol/L 29 28 31   Calcium 8.9 - 10.3 mg/dL 9.7 9.5 9.7    CBC: CBC Latest Ref Rng & Units 09/26/2016 09/23/2016 09/12/2016  WBC 4.0 - 10.5 K/uL 5.7 6.2 6.0  Hemoglobin 13.0 - 17.0 g/dL 9.1(L) 9.0(L) 10.1(L)  Hematocrit 39.0 - 52.0 % 29.1(L) 29.0(L) 32.0(L)  Platelets 150 - 400 K/uL 372 408(H) 419(H)    CBG: No results for input(s): GLUCAP in the last 168 hours.   Brief HPI:   Patrick Romero a 56 y.o.malewho was found unresponsive behind [place] on 08/31/16 with RUE weakness and lack of movement BLE and LUE as well as poor rectal tone. History taken from chart review and patient. ETOH level 324. MRI cervical spine reviewed, showing central cord abnormality C2-3 and C3-4. 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 He was evaluated by Dr. Christella Noa who recommended cervical collar for support and Lyrica added to help manage dysesthesias. He has had improvement in motor strength but patient with deficits in mobility and self care tasks due to quadriplegia. CIR recommended for follow up therapy.    Hospital Course:  Patrick Romero was admitted to rehab 09/05/2016 for inpatient therapies to consist of PT and OT at least three hours five days a week. Past admission physiatrist, therapy team and rehab RN have worked together to provide customized collaborative inpatient rehab. Blood pressures have been monitored on bid basis and orthostasis has resolved. He was encouraged to push po fluids to help with pre-renal azotemia and this is resolving. Follow up CBC shows that ABLA is resolving. Dysesthesias have improved with titration of lyrica and pain control has improved. Oxycodone was discontinued and pain is currently controlled with prn use of tramadol. Dopplers done past admission on 2/27 showed evidence of acute DVT involving RLE posterior tibial vein. He was maintained on Lovenox for during his stay and has been asymptomatic. Follow up dopplers 3/22 was negative for DVT therefore Lovenox was discontinued at discharge. Foley was discontinued and he was started on bowel and bladder program. He is currently voiding without difficulty and is continent of bowel and bladder. Neck incision is healing well without signs or symptoms of infection. Mood is stable and he has progressed nicely. He is currently at supervision level and will continue to receive follow up HHPT and [place] by [place] after discharge.    Rehab course: During patient's stay in rehab weekly team conferences were held to monitor patient's progress, set goals and discuss barriers to discharge. At admission, patient required max assist with mobility and total assist with basic self  care tasks. He has had improvement in activity tolerance, balance, postural control, as well as ability to compensate for deficits. He is has had improvement in functional use RUE/LUE  and RLE/LLE as well as improved awareness. He continues to be impaired by RUE>RLE weakness but is able to complete ADL tasks with supervision. He is modified independent for transfers and is able  to ambulate 60' with supervision. He requires shoes with right toe cap to help with foot clearance. He is able to climb 16 stairs with supervision. Family education was completed with care partner regarding all aspects of care.   Disposition: Home  Diet: Regular.   Special Instructions: 1. No driving.  2. Wear collar till cleared by NS. 3. Will need renal ultrasound for follow up of renal cyst.    Discharge Instructions    Ambulatory referral to Physical Medicine Rehab    Complete by:  As directed    1-2 weeks transitional care appt     Allergies as of 09/30/2016   No Known Allergies     Medication List    TAKE these medications   cyclobenzaprine 5 MG tablet Commonly known as:  FLEXERIL Take 1 tablet (5 mg total) by mouth 3 (three) times daily as needed for muscle spasms.   multivitamins ther. w/minerals Tabs tablet Take 1 tablet by mouth daily.   pantoprazole 40 MG tablet Commonly known as:  PROTONIX Take 1 tablet (40 mg total) by mouth daily.   polyethylene glycol packet Commonly known as:  MIRALAX / GLYCOLAX Take 17 g by mouth 2 (two) times daily.   pregabalin 100 MG capsule Commonly known as:  LYRICA Take 1 capsule (100 mg total) by mouth 3 (three) times daily.   traMADol 50 MG tablet Commonly known as:  ULTRAM Take 1 tablet (50 mg total) by mouth every 6 (six) hours as needed for moderate pain.      Follow-up Information    Meredith Staggers, MD Follow up.   Specialty:  Physical Medicine and Rehabilitation Why:  office will call you with follow up appointment Contact information: [address] [place] [address] [phone]        CABBELL,KYLE L, MD. Call in 1 day(s).   Specialty:  Neurosurgery Why:  for follow up appointment Contact information: [address] [place] [address] [place] Follow up on 10/04/2016.   Why:  @ 2:00 pm Contact information: [address] [place], [place]   [address] [phone]           Signed: Bary Romero 10/03/2016, 4:42 PM

## 2016-09-29 ENCOUNTER — Ambulatory Visit (HOSPITAL_COMMUNITY): Payer: Self-pay | Admitting: Physical Therapy

## 2016-09-29 ENCOUNTER — Encounter (HOSPITAL_COMMUNITY): Payer: Self-pay | Admitting: Occupational Therapy

## 2016-09-29 ENCOUNTER — Inpatient Hospital Stay (HOSPITAL_COMMUNITY): Payer: Self-pay | Admitting: Occupational Therapy

## 2016-09-29 ENCOUNTER — Inpatient Hospital Stay (HOSPITAL_COMMUNITY): Payer: Medicaid Other

## 2016-09-29 ENCOUNTER — Inpatient Hospital Stay (HOSPITAL_COMMUNITY): Payer: Self-pay | Admitting: Physical Therapy

## 2016-09-29 DIAGNOSIS — M79609 Pain in unspecified limb: Secondary | ICD-10-CM

## 2016-09-29 MED ORDER — POLYETHYLENE GLYCOL 3350 17 G PO PACK: 17.0000 g | PACK | Freq: Two times a day (BID) | ORAL | 0 refills | Status: DC

## 2016-09-29 MED ORDER — TRAMADOL HCL 50 MG PO TABS: 50.0000 mg | ORAL_TABLET | Freq: Four times a day (QID) | ORAL | 0 refills | Status: DC | PRN

## 2016-09-29 MED ORDER — PREGABALIN 100 MG PO CAPS: 100.0000 mg | ORAL_CAPSULE | Freq: Three times a day (TID) | ORAL | 0 refills | Status: DC

## 2016-09-29 MED ORDER — CYCLOBENZAPRINE HCL 5 MG PO TABS: 5.0000 mg | ORAL_TABLET | Freq: Three times a day (TID) | ORAL | 0 refills | Status: DC | PRN

## 2016-09-29 MED ORDER — PANTOPRAZOLE SODIUM 40 MG PO TBEC: 40.0000 mg | DELAYED_RELEASE_TABLET | Freq: Every day | ORAL | 0 refills | Status: DC

## 2016-09-29 MED ORDER — OXYCODONE HCL 5 MG PO TABS: 5.0000 mg | ORAL_TABLET | ORAL | 0 refills | Status: DC | PRN

## 2016-09-29 NOTE — Progress Notes (Signed)
Occupational Therapy Session Note  Patient Details  Name: Patrick Romero MRN: [id number] Date of Birth: [birth year]  Today's Date: 09/29/2016 OT Individual Time: 1000-1100 and 1430-1500 OT Individual Time Calculation (min): 60 min and 30 min   Short Term Goals: Week 3:  OT Short Term Goal 1 (Week 3): STG=LTG due to LOS  Skilled Therapeutic Interventions/Progress Updates:    Session One: Pt seen for OT ADL bathing/dressing session. Pt in supine upon arrival, agreeable to tx session. He voiced increaed pain in back and neck, RN already aware and medication administered during session. Pt agreeable to bathing at shower level. He ambulated throughout room with supervision to gather clothing items, reaching into bottoms drawers to obtain items, no LOB episodes.  He bathed seated on tub bench with set-up assist. When drying off noted bleeding coming from buttock area, RN made aware and assessed. Cervical brace pads changed total A. He dressed seated EOB with distant supervision, completing sit <> Stands to pull pants up.  He returned to sink to complete grooming tasks.  Pt left seated in w/c at end of session, all needs in reach.  Educated throughout session regarding d/c planning, reducing risk of falls, decreased sensation and functional implications, and planning for caregiver training to take place this afternoon.   Session Two: Pt seen for OT session focusing on family education with pt's sister, niece, and girlfriend. Pt sitting up in w/c, upon arrival with family members present, agreeable to tx session.  He ambulated throughout session with distant supervision. He completed simulated tub/shower transfer using tub bench. Educated and demonstrated to family how to set-up bench and adjust for home. Pt returned to supine on bed. Demonstrated how to remove and replace cervical brace pads, pt's sister and girlfriend return demonstrated with mod VCs from therapist. Family took photos of technique  for cervical brace and voiced feeling comfortable and confident with providing needed assist at d/c. Pt ambulated back to room at end of session, left seated in w/c with family members present.   Therapy Documentation Precautions:  Precautions Precautions: Cervical Required Braces or Orthoses: Cervical Brace Cervical Brace: Hard collar Restrictions Weight Bearing Restrictions: No Other Position/Activity Restrictions: Pt with very weak core ADL: ADL ADL Comments: refer to functional navigator  See Function Navigator for Current Functional Status.   Therapy/Group: Individual Therapy  [name], Corra Kaine C 09/29/2016, 7:06 AM

## 2016-09-29 NOTE — Progress Notes (Signed)
VASCULAR LAB PRELIMINARY  PRELIMINARY  PRELIMINARY  PRELIMINARY  Bilateral lower extremity venous duplex completed.    Preliminary report:  Bilateral:  No evidence of DVT, superficial thrombosis, or Baker's Cyst.   Asheley Hellberg, RVS 09/29/2016, 12:06 PM

## 2016-09-29 NOTE — Progress Notes (Signed)
Physical Therapy Note  Patient Details  Name: Patrick Romero MRN: [id number] Date of Birth: 10-14-1960 Today's Date: 09/29/2016  0900-0930, 30 min individual tx Pain:7/10 R shoulder, neck; better after UE exs; declined meds  Gait to/from tx.  neuromuscular re-education via forced use, mulimodal cues for seated 10 x 1 bil shoulder adduction,. bil hip adduction against resistance for core activation; standing R hip and knee flexion for foot taps onto 2" step, R/L hamstring curls.  Sustained stretch bil hamstrings/heel cords sttanding on wedge x 2 minutes, and seated soleus stretch with feet on wedge. With external perturbations, pt demonstrated L ankle strategy, bil delayed hip strategy, and R delayed stepping strategy. PT educated pt about implications of balance deficits around pets, and in the community. Gait returning to room kicking Yoga block for exaggerated wt shift.  Pt retrieved small item off floor with supervison.  Pt left resting sitting on bed, seated, with  all needs within reach.  [name] 09/29/2016, 7:48 AM

## 2016-09-29 NOTE — Progress Notes (Signed)
Physical Therapy Session Note  Patient Details  Name: Patrick Romero MRN: [id number] Date of Birth: [birth year]  Today's Date: 09/29/2016 PT Individual Time:  -      Short Term Goals: Week 3:  PT Short Term Goal 1 (Week 3): =LTG due to estimated LOS  Skilled Therapeutic Interventions/Progress Updates:    Pt up in room upon arrival, reports feeling a little sore but doing pretty good. Nu-step L6 X10 min, no rest break needed, using rt hand brace to keep grip. Mat hamstring stretch, bent knee fall out hip adductor, prone quad stretch, standing runners calf stretch (each 3X30 seconds). Sidelying Rt external rotation with assist for full range, isometric IR (each 1X10). Standing static balance with variable stances and width. Standing Rt knee flexion with dorsiflexion into heel strike with multiple repetitions. Up/down 12 steps with rail, alternating pattern. Ambulating 200 ft back to room with supervision. Improved foot clearance with swing phase on Rt following dorsiflexion stretch. Pt sitting in w/c after session with all needs in reach.   Therapy Documentation Precautions:  Precautions Precautions: Cervical Required Braces or Orthoses: Cervical Brace Cervical Brace: Hard collar Restrictions Weight Bearing Restrictions: No Other Position/Activity Restrictions: Pt with very weak core Pain:  7/10, neck back. Nursing notified, requesting pain medication.    See Function Navigator for Current Functional Status.   Therapy/Group: Individual Therapy  Cassell Clement, PT, CSCS   09/29/2016, 8:39 AM

## 2016-09-29 NOTE — Progress Notes (Signed)
Occupational Therapy Discharge Summary  Patient Details  Name: Patrick Romero MRN: [id number] Date of Birth: June 23, 1961   Patient has met 10 of 10 long term goals due to improved activity tolerance, improved balance, postural control, functional use of  RIGHT upper, RIGHT lower, LEFT upper and LEFT lower extremity and improved coordination.  Patient to discharge at overall Supervision level.  Patient's care partner is independent to provide the necessary physical assistance at discharge.  Patient made excellent progress while on CIR. He is discharging at overall supervision level. He cont to be most limited by impaired UEs R>L.    Recommendation:  Patient will benefit from ongoing skilled OT services in home health setting to continue to advance functional skills in the area of BADL, iADL and Reduce care partner burden.  Equipment: Tub transfer bench  Reasons for discharge: treatment goals met and discharge from hospital  Patient/family agrees with progress made and goals achieved: Yes  OT Discharge Precautions/Restrictions  Precautions Precautions: Cervical Required Braces or Orthoses: Cervical Brace Cervical Brace: Hard collar;At all times Restrictions Weight Bearing Restrictions: No ADL ADL ADL Comments: refer to functional navigator Vision/Perception  Vision- History Baseline Vision/History: Wears glasses Wears Glasses: Reading only Patient Visual Report: No change from baseline Vision- Assessment Vision Assessment?: No apparent visual deficits  Cognition Overall Cognitive Status: Within Functional Limits for tasks assessed Arousal/Alertness: Awake/alert Orientation Level: Oriented X4 Memory: Appears intact Awareness: Appears intact Problem Solving: Appears intact Safety/Judgment: Appears intact Sensation Sensation Light Touch: Impaired Detail Light Touch Impaired Details: Impaired RUE;Impaired LUE;Impaired RLE;Impaired LLE Coordination Gross Motor Movements  are Fluid and Coordinated: No Fine Motor Movements are Fluid and Coordinated: No Coordination and Movement Description: Generalized weakness Finger Nose Finger Test: WFL Motor  Motor Motor: Tetraplegia Motor - Discharge Observations: Incomplete paraperesis R>L Trunk/Postural Assessment  Cervical Assessment Cervical Assessment: Exceptions to [facility] (Cervical brace) Thoracic Assessment Thoracic Assessment: Within Functional Limits Lumbar Assessment Lumbar Assessment: Within Functional Limits Postural Control Postural Control: Deficits on evaluation Trunk Control: imapired due to weak core, however, much improved since admission  Balance Balance Balance Assessed: Yes Dynamic Sitting Balance Dynamic Sitting - Level of Assistance: 6: Modified independent (Device/Increase time) Sitting balance - Comments: Sitting EOB to complete dressing tasks Static Standing Balance Static Standing - Balance Support: During functional activity Static Standing - Level of Assistance: 5: Stand by assistance Dynamic Standing Balance Dynamic Standing - Level of Assistance: 5: Stand by assistance Extremity/Trunk Assessment RUE Assessment RUE Assessment: Exceptions to [facility] RUE AROM (degrees) Overall AROM Right Upper Extremity: Deficits RUE Overall AROM Comments: ~1/4 ROM shoulder flexion, ~45 degrees elbow flexion, limited wrist movement and limited finger opposition RUE Strength RUE Overall Strength: Deficits RUE Overall Strength Comments: 2-/5 elbow  and shoulder flexion, weak gross grasp LUE Assessment LUE Assessment: Exceptions to WFL LUE AROM (degrees) Overall AROM Left Upper Extremity: Within functional limits for tasks assessed LUE Strength LUE Overall Strength: Deficits (4-/5 throughout)   See Function Navigator for Current Functional Status.  [name], Shanique Aslinger C 09/29/2016, 11:39 AM

## 2016-09-29 NOTE — Progress Notes (Signed)
Subjective/Complaints: Up working with therapy. No new complaints. Feels well  ROS: pt denies nausea, vomiting, diarrhea, cough, shortness of breath or chest pain    Objective: Vital Signs: Blood pressure 107/86, pulse 64, temperature 98.2 F (36.8 C), temperature source Oral, resp. rate 18, weight 67 kg (147 lb 12 oz), SpO2 100 %. No results found. No results found for this or any previous visit (from the past 72 hour(s)).   HEENT: Poor dentition. Normocephalic. Atraumatic.  Cardio: RRR Neck: C-collar in place. fitting Resp: CTA bilaterally normal effort GI: BS NT Skin:   CDI  Neuro: Alert and oriented x  3+ to 4-/5 left deltoid, biceps,3+ triceps, wrist extension 3+/5, hand grip  3+/5 . 3-/5 right deltoid muscle, biceps,3 triceps, wrist extension, hand grip   3/5  4-/5 at the ankle dorsiflexor, plantar flexor, 4   KE/HF Musc: no edema  Gen.: NAD    Assessment/Plan: 1. 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 Physiatrist is providing close team supervision and 24 hour management of active medical problems listed below. Physiatrist and rehab team continue to assess barriers to discharge/monitor patient progress toward functional and medical goals. FIM: Function - Bathing Position: Shower Body parts bathed by patient: Right arm, Chest, Abdomen, Front perineal area, Buttocks, Right upper leg, Left upper leg, Right lower leg, Left lower leg Body parts bathed by helper: Left arm (Hand over hand for washing L arm) Bathing not applicable: Back Assist Level: Touching or steadying assistance(Pt > 75%)  Function- Upper Body Dressing/Undressing What is the patient wearing?: Pull over shirt/dress Pull over shirt/dress - Perfomed by patient: Thread/unthread right sleeve, Thread/unthread left sleeve, Pull shirt over trunk, Put head through opening Pull over  shirt/dress - Perfomed by helper: Put head through opening Assist Level: More than reasonable time Set up : To obtain clothing/put away Function - Lower Body Dressing/Undressing What is the patient wearing?: Shoes Position: Sitting EOB Pants- Performed by patient: Thread/unthread right pants leg, Thread/unthread left pants leg, Pull pants up/down Pants- Performed by helper: Pull pants up/down Non-skid slipper socks- Performed by patient: Don/doff right sock, Don/doff left sock Non-skid slipper socks- Performed by helper: Don/doff right sock, Don/doff left sock Shoes - Performed by patient: Don/doff right shoe, Don/doff left shoe Shoes - Performed by helper: Fasten right, Fasten left TED Hose - Performed by helper: Don/doff right TED hose, Don/doff left TED hose Assist for footwear: Setup Assist for lower body dressing: Supervision or verbal cues  Function - Toileting Toileting activity did not occur: No continent bowel/bladder event (using urinal, no BM ) Toileting steps completed by patient: Adjust clothing prior to toileting, Performs perineal hygiene, Adjust clothing after toileting Toileting steps completed by helper: Adjust clothing prior to toileting, Adjust clothing after toileting Toileting Assistive Devices: Grab bar or rail Assist level: Supervision or verbal cues  Function - Air cabin crew transfer assistive device: Grab bar Assist level to toilet: Supervision or verbal cues Assist level from toilet: Supervision or verbal cues  Function - Chair/bed transfer Chair/bed transfer method: Ambulatory Chair/bed transfer assist level: Supervision or verbal cues Chair/bed transfer assistive device: Armrests Chair/bed transfer details: Verbal cues for precautions/safety  Function - Locomotion: Wheelchair Type: Manual Max wheelchair distance: 150 Assist Level: Supervision or verbal cues Assist Level: Supervision or verbal cues Assist Level: Supervision or verbal  cues Function - Locomotion: Ambulation Assistive device: No device Max distance: 150 Assist level: Supervision or  verbal cues Assist level: Supervision or verbal cues Assist level: Supervision or verbal cues Assist level: Supervision or verbal cues Assist level: Supervision or verbal cues  Function - Comprehension Comprehension: Auditory Comprehension assist level: Follows complex conversation/direction with no assist  Function - Expression Expression: Verbal Expression assist level: Expresses complex ideas: With no assist, Expresses complex ideas: With extra time/assistive device  Function - Social Interaction Social Interaction assist level: Interacts appropriately with others - No medications needed.  Function - Problem Solving Problem solving assist level: Solves complex problems: With extra time  Function - Memory Memory assist level: More than reasonable amount of time Patient normally able to recall (first 3 days only): Staff names and faces, That he or she is in a hospital Medical Problem List and Plan: 1.  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  Continue CIR- PT, OT  -progressing nicely.  2.  DVT Prophylaxis/Anticoagulation: Pharmaceutical: Lovenox  -posterior tibial dvt---dopplers pending for today.  Would not anticipate need for a/c once home 3. Pain Management: Continue oxycodone prn for now. Wean as able Added ultram for moderate pain. Dysesthesias managed on current dose lyrica.  4. Mood: LCSW to follow for evaluation and support.  5. Neuropsych: This patient is capable of making decisions on his own behalf. 6. Skin/Wound Care:  Air mattress overlay. Pressure relief measures.  7. Fluids/Electrolytes/Nutrition:     -BUN slowly improving--encourage PO       Component Value Date/Time   NA 137 09/23/2016 0537   K 4.3 09/23/2016 0537   CL 99 (L) 09/23/2016 0537   CO2 29 09/23/2016 0537   GLUCOSE 94 09/23/2016 0537   BUN 24 (H) 09/23/2016  0537   CREATININE 0.91 09/23/2016 0537   CALCIUM 9.7 09/23/2016 0537   GFRNONAA >60 09/23/2016 0537   GFRAA >60 09/23/2016 0537   8. History of presyncope/syncope: Monitor for now.Unclear whether that the orthostatic changes are separate issue,Patient has had no orthostatic drops since using the TED hose and abdominal binder 9. Anemia: question of chronic disease due to Alcohol use.Normocytic   -small amount of blood in stool Wednesday. No further issues clinically  -hgb 9.1 stabilized at 9.1  -follow up as outpt   .      Component Value Date/Time   WBC 5.7 09/26/2016 0618   RBC 3.59 (L) 09/26/2016 0618   HGB 9.1 (L) 09/26/2016 0618   HCT 29.1 (L) 09/26/2016 0618   PLT 372 09/26/2016 0618   MCV 81.1 09/26/2016 0618   MCH 25.3 (L) 09/26/2016 0618   MCHC 31.3 09/26/2016 0618   RDW 12.8 09/26/2016 0618   LYMPHSABS 2.2 09/06/2016 0732   MONOABS 0.9 09/06/2016 0732   EOSABS 0.3 09/06/2016 0732   BASOSABS 0.0 09/06/2016 0732   10. Mid chest wall pain:  Resolved    11. Right renal cyst: Will need non-emergent follow up ultrasound for work up.  12. ETOH abuse/Tobacco: Counsel as appropriate  LOS (Days) 24 A FACE TO FACE EVALUATION WAS PERFORMED  Patrick Romero T 09/29/2016, 8:58 AM

## 2016-09-29 NOTE — Progress Notes (Signed)
Physical Therapy Discharge Summary  Patient Details  Name: Patrick Romero MRN: [id number] Date of Birth: 02/05/1961  Today's Date: 09/29/2016 PT Individual Time: 1305-1400 PT Individual Time Calculation (min): 55 min    Patient has met 9 of 9 long term goals due to improved activity tolerance, improved balance, improved postural control, increased strength, increased range of motion, decreased pain, ability to compensate for deficits, functional use of  right upper extremity, right lower extremity, left upper extremity and left lower extremity, improved attention, improved awareness and improved coordination.  Patient to discharge at an ambulatory level Supervision.   Patient's care partner is independent to provide the necessary physical assistance at discharge.  Reasons goals not met: All goals met  Recommendation:  Patient will benefit from ongoing skilled PT services in home health setting to continue to advance safe functional mobility, address ongoing impairments in strength/coordination, balance, activity tolerance, and minimize fall risk.  Equipment: No equipment provided  Reasons for discharge: treatment goals met and discharge from hospital  Patient/family agrees with progress made and goals achieved: Yes  PT Discharge Precautions/Restrictions Precautions Precautions: Cervical Required Braces or Orthoses: Cervical Brace Cervical Brace: Hard collar;At all times Restrictions Weight Bearing Restrictions: No Vital Signs Therapy Vitals Temp: 98.7 F (37.1 C) Temp Source: Oral Pulse Rate: 89 Resp: 18 BP: 112/72 Patient Position (if appropriate): Sitting Oxygen Therapy SpO2: 100 % O2 Device: Not Delivered Pain Pain Assessment Pain Assessment: 0-10 Pain Score: 0-No pain Pain Type: Acute pain Pain Location: Neck Pain Orientation: Lower Pain Radiating Towards: shoulders Pain Descriptors / Indicators: Aching;Sore Pain Frequency: Intermittent Pain Onset:  Gradual Patients Stated Pain Goal: 2 Pain Intervention(s): Medication (See eMAR) Vision/Perception   WFL  Cognition Overall Cognitive Status: Within Functional Limits for tasks assessed Arousal/Alertness: Awake/alert Orientation Level: Oriented X4 Memory: Appears intact Awareness: Appears intact Problem Solving: Appears intact Safety/Judgment: Appears intact Sensation Sensation Light Touch: Impaired Detail Light Touch Impaired Details: Impaired RUE;Impaired LUE;Impaired RLE;Impaired LLE Coordination Gross Motor Movements are Fluid and Coordinated: No Fine Motor Movements are Fluid and Coordinated: No Coordination and Movement Description: Generalized weakness Finger Nose Finger Test: WFL Motor  Motor Motor: Tetraplegia Motor - Discharge Observations: Incomplete paraperesis R>L  Mobility Bed Mobility Bed Mobility: Rolling Right;Sit to Supine;Supine to Sit Rolling Right: 6: Modified independent (Device/Increase time) Supine to Sit: 6: Modified independent (Device/Increase time) Sit to Supine: 6: Modified independent (Device/Increase time) Transfers Transfers: Yes Sit to Stand: 6: Modified independent (Device/Increase time) Stand to Sit: 6: Modified independent (Device/Increase time) Stand Pivot Transfers: 6: Modified independent (Device/Increase time) Lateral/Scoot Transfers: 6: Modified independent (Device/Increase time) Locomotion  Ambulation Ambulation: Yes Ambulation/Gait Assistance: 5: Supervision Ambulation Distance (Feet): 175 Feet Assistive device: Other (Comment) (toe cap RLE) Gait Gait: Yes Gait Pattern: Impaired Gait Pattern: Decreased dorsiflexion - left;Poor foot clearance - right;Narrow base of support;Right circumduction;Decreased hip/knee flexion - right Gait velocity: decreased for age/gender norms Stairs / Additional Locomotion Stairs: Yes Stairs Assistance: 5: Supervision Stair Management Technique: Forwards;Alternating pattern;Two rails Number of  Stairs: 16 Height of Stairs: 6 Ramp: 5: Supervision Curb: 5: Psychiatric nurse: Yes Wheelchair Assistance: 5: Careers information officer: Both lower extermities Wheelchair Parts Management: Supervision/cueing  Trunk/Postural Assessment  Cervical Assessment Cervical Assessment: Exceptions to [facility] (Cervical brace) Thoracic Assessment Thoracic Assessment: Within Functional Limits Lumbar Assessment Lumbar Assessment: Within Functional Limits Postural Control Postural Control: Deficits on evaluation Trunk Control: imapired due to weak core, however, much improved since admission  Balance Balance Balance Assessed: Yes Dynamic Sitting Balance  Dynamic Sitting - Level of Assistance: 6: Modified independent (Device/Increase time) Sitting balance - Comments: Sitting EOB to complete dressing tasks Static Standing Balance Static Standing - Balance Support: During functional activity Static Standing - Level of Assistance: 5: Stand by assistance Dynamic Standing Balance Dynamic Standing - Level of Assistance: 5: Stand by assistance Extremity Assessment  RUE Assessment RUE Assessment: Exceptions to [facility] RUE AROM (degrees) Overall AROM Right Upper Extremity: Deficits RUE Overall AROM Comments: ~1/4 ROM shoulder flexion, ~45 degrees elbow flexion, limited wrist movement and limited finger opposition RUE Strength RUE Overall Strength: Deficits RUE Overall Strength Comments: 2-/5 elbow  and shoulder flexion, weak gross grasp LUE Assessment LUE Assessment: Exceptions to WFL LUE AROM (degrees) Overall AROM Left Upper Extremity: Within functional limits for tasks assessed LUE Strength LUE Overall Strength: Deficits (4-/5 throughout) RLE Assessment RLE Assessment: Exceptions to [facility] RLE Strength RLE Overall Strength: Deficits Right Hip Flexion: 4/5 Right Knee Flexion: 4/5 Right Knee Extension: 4+/5 Right Ankle Dorsiflexion: 4-/5 Right Ankle Plantar  Flexion: 4/5 RLE Tone RLE Tone: Moderate;Hypertonic Hypertonic Details: RLE sustained clonus, extensor tone during gait impairs R foot clearance LLE Assessment LLE Assessment: Within Functional Limits  Skilled Therapeutic Intervention: Family education performed with pt's sister and family friend. Educated pt and sister in recommendations for S with ambulation and stairs, wearing shoes with R toe cap to reduce tripping and lower risk of falls, and to continue stretching/strengthening HEP. Pt's sister asking about equipment including hospital bed and w/c; discussed with her that these pieces of equipment were not necessary d/t pt high mobility level,  Pt ambulated in unit with S. Performed stairs, car transfer, ramp and mulch surface with S; performed bed mobility with modI. Discussed with pt and family effect of alcohol on balance, and high risk for falls combined with present balance impairments. Pt remained seated in w/c at end of session, all needs in reach and family present. Pt and family with no further questions/concerns regarding d/t home at this time. Provided pt/family with handout of PT recommendations.    See Function Navigator for Current Functional Status.  Benjiman Core Tygielski 09/29/2016, 2:00 PM

## 2016-09-29 NOTE — Progress Notes (Signed)
Social Work Patient ID: Patrick Romero, male   DOB: 10-15-1960, 56 y.o.   MRN: [id number]   Met with pt and spoke with his sister after team conference.  They are both feeling ready for d/c end of week and sister  in today for family education.  Have arranged for [facility] f/u as pt is still pending Medicaid approval.  D/c note with referral info to follow.  Larue Drawdy, LCSW

## 2016-09-30 NOTE — Progress Notes (Signed)
Recreational Therapy Discharge Summary Patient Details  Name: Patrick Romero MRN: [id number] Date of Birth: [birth year] Today's Date: 09/30/2016 Comments on progress toward goals: Pt has made great progress during LOS and is discharging home today at supervision ambulatory level.  Although community outing was cancelled, pt practiced community ambulation >500' on indoor/outdoor surfaces, level  and unlevel with close S before requiring seated rest break.  Education provided on safety concerns throughout LOS.  Reasons for discharge: discharge from hospital  Patient/family agrees with progress made and goals achieved: Yes  Syriah Delisi 09/30/2016, 9:17 AM

## 2016-09-30 NOTE — Progress Notes (Signed)
Patient and family received discharge instructions from Marlowe Shores, [facility] with verbal understanding. Patient given requested pain medication to assist with the ride home (approved by Marlowe Shores, PA-C). Patient discharged to home with family and patient belongings.

## 2016-09-30 NOTE — Progress Notes (Addendum)
Subjective/Complaints: Up in bed. No new issues. Excited to go home!  ROS: pt denies nausea, vomiting, diarrhea, cough, shortness of breath or chest pain    Objective: Vital Signs: Blood pressure 92/67, pulse 77, temperature 97.7 F (36.5 C), temperature source Oral, resp. rate 18, weight 67 kg (147 lb 12 oz), SpO2 100 %. No results found. No results found for this or any previous visit (from the past 72 hour(s)).   HEENT: Poor dentition. Normocephalic. Atraumatic.  Cardio: RRR Neck: C-collar in place. fitting Resp: CTA bilaterally normal effort GI: BS NT Skin:   CDI  Neuro: Alert and oriented x  3+ to 4-/5 left deltoid, biceps,3+ triceps, wrist extension 3+/5, hand grip  3+/5 . 3-/5 right deltoid muscle, biceps,3 triceps, wrist extension, hand grip   3/5 --stable 4-/5 at the ankle dorsiflexor, plantar flexor, 4   KE/HF Musc: no edema  Gen.: NAD    Assessment/Plan: 1. 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 Physiatrist is providing close team supervision and 24 hour management of active medical problems listed below. Physiatrist and rehab team continue to assess barriers to discharge/monitor patient progress toward functional and medical goals. FIM: Function - Bathing Position: Shower Body parts bathed by patient: Right arm, Chest, Abdomen, Front perineal area, Buttocks, Right upper leg, Left upper leg, Right lower leg, Left lower leg, Left arm Body parts bathed by helper: Left arm (Hand over hand for washing L arm) Bathing not applicable: Back Assist Level: Touching or steadying assistance(Pt > 75%) (Hand over hand for washing L UE)  Function- Upper Body Dressing/Undressing What is the patient wearing?: Pull over shirt/dress Pull over shirt/dress - Perfomed by patient: Thread/unthread right sleeve, Thread/unthread left sleeve, Pull shirt over  trunk, Put head through opening Pull over shirt/dress - Perfomed by helper: Put head through opening Assist Level: More than reasonable time Set up : To obtain clothing/put away Function - Lower Body Dressing/Undressing What is the patient wearing?: Shoes, Underwear, Pants, Ted Hose Position: Sitting EOB Underwear - Performed by patient: Thread/unthread right underwear leg, Thread/unthread left underwear leg, Pull underwear up/down Pants- Performed by patient: Thread/unthread right pants leg, Thread/unthread left pants leg, Pull pants up/down Pants- Performed by helper: Pull pants up/down Non-skid slipper socks- Performed by patient: Don/doff right sock, Don/doff left sock Non-skid slipper socks- Performed by helper: Don/doff right sock, Don/doff left sock Shoes - Performed by patient: Don/doff right shoe, Don/doff left shoe (Elastic shoelaces) Shoes - Performed by helper: Fasten right, Fasten left TED Hose - Performed by helper: Don/doff right TED hose, Don/doff left TED hose Assist for footwear: Independent Assist for lower body dressing: Supervision or verbal cues  Function - Toileting Toileting activity did not occur: No continent bowel/bladder event (using urinal, no BM ) Toileting steps completed by patient: Adjust clothing prior to toileting, Performs perineal hygiene, Adjust clothing after toileting Toileting steps completed by helper: Adjust clothing prior to toileting, Adjust clothing after toileting Toileting Assistive Devices: Grab bar or rail Assist level: Supervision or verbal cues  Function - Air cabin crew transfer assistive device: Grab bar Assist level to toilet: Supervision or verbal cues Assist level from toilet: Supervision or verbal cues  Function - Chair/bed transfer Chair/bed transfer method: Stand pivot, Ambulatory Chair/bed transfer assist level: No Help, no cues, assistive device, takes more than a reasonable amount of time Chair/bed transfer  assistive device: Armrests Chair/bed transfer details: Verbal cues for sequencing  Function - Locomotion: Wheelchair Will patient use wheelchair at discharge?: No Type: Manual Max wheelchair distance: 150 Assist Level: Supervision or verbal cues Assist Level: Supervision or verbal cues Assist Level: Supervision or verbal cues Function - Locomotion: Ambulation Assistive device: No device Max distance: 200 ft Assist level: Supervision or verbal cues Assist level: Supervision or verbal cues Assist level: Supervision or verbal cues Assist level: Supervision or verbal cues Assist level: Supervision or verbal cues  Function - Comprehension Comprehension: Auditory Comprehension assist level: Follows complex conversation/direction with no assist  Function - Expression Expression: Verbal Expression assist level: Expresses complex ideas: With no assist  Function - Social Interaction Social Interaction assist level: Interacts appropriately with others - No medications needed.  Function - Problem Solving Problem solving assist level: Solves complex problems: Recognizes & self-corrects  Function - Memory Memory assist level: Complete Independence: No helper Patient normally able to recall (first 3 days only): Staff names and faces, That he or she is in a hospital Medical Problem List and Plan: 1.  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  -dc home today  -Patient to see MD in the office for transitional care encounter in 1-2 weeks. 2.  DVT Prophylaxis/Anticoagulation:   -posterior tibial dvt---resolved on dopplers yesterday---> dc lovenox 3. Pain Management: Continue oxycodone prn for now. Wean as able Added ultram for moderate pain. Dysesthesias managed on current dose lyrica.  4. Mood: LCSW to follow for evaluation and support.  5. Neuropsych: This patient is capable of making decisions on his own behalf. 6. Skin/Wound Care:  Air mattress overlay. Pressure  relief measures.  7. Fluids/Electrolytes/Nutrition:     -BUN slowly improved      Component Value Date/Time   NA 137 09/23/2016 0537   K 4.3 09/23/2016 0537   CL 99 (L) 09/23/2016 0537   CO2 29 09/23/2016 0537   GLUCOSE 94 09/23/2016 0537   BUN 24 (H) 09/23/2016 0537   CREATININE 0.91 09/23/2016 0537   CALCIUM 9.7 09/23/2016 0537   GFRNONAA >60 09/23/2016 0537   GFRAA >60 09/23/2016 0537   8. History of presyncope/syncope: Monitor for now.Unclear whether that the orthostatic changes are separate issue,Patient has had no orthostatic drops since using the TED hose and abdominal binder 9. Anemia: question of chronic disease due to Alcohol use.Normocytic   -small amount of blood in stool Wednesday. No further issues clinically  -hgb 9.1 stabilized at 9.1    -follow up as outpt   .      Component Value Date/Time   WBC 5.7 09/26/2016 0618   RBC 3.59 (L) 09/26/2016 0618   HGB 9.1 (L) 09/26/2016 0618   HCT 29.1 (L) 09/26/2016 0618   PLT 372 09/26/2016 0618   MCV 81.1 09/26/2016 0618   MCH 25.3 (L) 09/26/2016 0618   MCHC 31.3 09/26/2016 0618   RDW 12.8 09/26/2016 0618   LYMPHSABS 2.2 09/06/2016 0732   MONOABS 0.9 09/06/2016 0732   EOSABS 0.3 09/06/2016 0732   BASOSABS 0.0 09/06/2016 0732   10. Mid chest wall pain:  Resolved    11. Right renal cyst: Will need non-emergent follow up ultrasound for work up.  12. ETOH abuse/Tobacco: Counsel as appropriate  LOS (Days) 25 A FACE TO FACE EVALUATION WAS PERFORMED  SWARTZ,ZACHARY T 09/30/2016, 9:31 AM

## 2016-10-03 NOTE — Progress Notes (Signed)
Social Work  Discharge Note  The overall goal for the admission was met for:   Discharge location: Yes - home with mother and sister able to provide 24/7 supervision  Length of Stay: Yes - 25 days  Discharge activity level: Yes - supervision  Home/community participation: Yes  Services provided included: MD, RD, PT, OT, RN, TR, Pharmacy, Neuropsych and SW  Financial Services: Medicaid and SSD applications still pending at time of d/c  Follow-up services arranged: Home Health: PT, OT via [place], DME: tub bench via [place] and Patient/Family has no preference for HH/DME agencies  Comments (or additional information):  Patient/Family verbalized understanding of follow-up arrangements: Yes  Individual responsible for coordination of the follow-up plan: pt  Confirmed correct DME delivered: Patrick Romero 10/03/2016    Patrick Romero

## 2016-10-04 ENCOUNTER — Ambulatory Visit: Payer: Medicaid Other | Attending: Internal Medicine | Admitting: Physician Assistant

## 2016-10-04 VITALS — BP 121/87 | HR 100 | Temp 98.3°F | Wt 137.2 lb

## 2016-10-04 DIAGNOSIS — M5412 Radiculopathy, cervical region: Secondary | ICD-10-CM

## 2016-10-04 DIAGNOSIS — S14129A Central cord syndrome at unspecified level of cervical spinal cord, initial encounter: Secondary | ICD-10-CM | POA: Diagnosis not present

## 2016-10-04 DIAGNOSIS — S14129D Central cord syndrome at unspecified level of cervical spinal cord, subsequent encounter: Secondary | ICD-10-CM

## 2016-10-04 DIAGNOSIS — S129XXD Fracture of neck, unspecified, subsequent encounter: Secondary | ICD-10-CM | POA: Diagnosis not present

## 2016-10-04 DIAGNOSIS — M542 Cervicalgia: Secondary | ICD-10-CM | POA: Diagnosis present

## 2016-10-04 DIAGNOSIS — M549 Dorsalgia, unspecified: Secondary | ICD-10-CM | POA: Diagnosis present

## 2016-10-04 DIAGNOSIS — F17200 Nicotine dependence, unspecified, uncomplicated: Secondary | ICD-10-CM | POA: Insufficient documentation

## 2016-10-04 DIAGNOSIS — X58XXXA Exposure to other specified factors, initial encounter: Secondary | ICD-10-CM | POA: Insufficient documentation

## 2016-10-04 MED ORDER — PREGABALIN 100 MG PO CAPS: 100.0000 mg | ORAL_CAPSULE | Freq: Three times a day (TID) | ORAL | 1 refills | Status: DC

## 2016-10-04 MED ORDER — TRAMADOL HCL 50 MG PO TABS: 50.0000 mg | ORAL_TABLET | Freq: Four times a day (QID) | ORAL | 0 refills | Status: DC | PRN

## 2016-10-04 MED ORDER — THERA M PLUS PO TABS: 1.0000 | ORAL_TABLET | Freq: Every day | ORAL | 1 refills | Status: DC

## 2016-10-04 MED ORDER — PANTOPRAZOLE SODIUM 40 MG PO TBEC: 40.0000 mg | DELAYED_RELEASE_TABLET | Freq: Every day | ORAL | 1 refills | Status: DC

## 2016-10-04 MED ORDER — CYCLOBENZAPRINE HCL 10 MG PO TABS: 10.0000 mg | ORAL_TABLET | Freq: Three times a day (TID) | ORAL | 0 refills | Status: DC | PRN

## 2016-10-04 NOTE — Progress Notes (Signed)
121/87 
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
## 2016-10-04 NOTE — Patient Instructions (Signed)
Steps to Quit Smoking Smoking tobacco can be harmful to your health and can affect almost every organ in your body. Smoking puts you, and those around you, at risk for developing many serious chronic diseases. Quitting smoking is difficult, but it is one of the best things that you can do for your health. It is never too late to quit. What are the benefits of quitting smoking? When you quit smoking, you lower your risk of developing serious diseases and conditions, such as:  Lung cancer or lung disease, such as COPD.  Heart disease.  Stroke.  Heart attack.  Infertility.  Osteoporosis and bone fractures.  Additionally, symptoms such as coughing, wheezing, and shortness of breath may get better when you quit. You may also find that you get sick less often because your body is stronger at fighting off colds and infections. If you are pregnant, quitting smoking can help to reduce your chances of having a baby of low birth weight. How do I get ready to quit? When you decide to quit smoking, create a plan to make sure that you are successful. Before you quit:  Pick a date to quit. Set a date within the next two weeks to give you time to prepare.  Write down the reasons why you are quitting. Keep this list in places where you will see it often, such as on your bathroom mirror or in your car or wallet.  Identify the people, places, things, and activities that make you want to smoke (triggers) and avoid them. Make sure to take these actions: ? Throw away all cigarettes at home, at work, and in your car. ? Throw away smoking accessories, such as ashtrays and lighters. ? Clean your car and make sure to empty the ashtray. ? Clean your home, including curtains and carpets.  Tell your family, friends, and coworkers that you are quitting. Support from your loved ones can make quitting easier.  Talk with your health care provider about your options for quitting smoking.  Find out what treatment  options are covered by your health insurance.  What strategies can I use to quit smoking? Talk with your healthcare provider about different strategies to quit smoking. Some strategies include:  Quitting smoking altogether instead of gradually lessening how much you smoke over a period of time. Research shows that quitting "cold turkey" is more successful than gradually quitting.  Attending in-person counseling to help you build problem-solving skills. You are more likely to have success in quitting if you attend several counseling sessions. Even short sessions of 10 minutes can be effective.  Finding resources and support systems that can help you to quit smoking and remain smoke-free after you quit. These resources are most helpful when you use them often. They can include: ? Online chats with a counselor. ? Telephone quitlines. ? Printed self-help materials. ? Support groups or group counseling. ? Text messaging programs. ? Mobile phone applications.  Taking medicines to help you quit smoking. (If you are pregnant or breastfeeding, talk with your health care provider first.) Some medicines contain nicotine and some do not. Both types of medicines help with cravings, but the medicines that include nicotine help to relieve withdrawal symptoms. Your health care provider may recommend: ? Nicotine patches, gum, or lozenges. ? Nicotine inhalers or sprays. ? Non-nicotine medicine that is taken by mouth.  Talk with your health care provider about combining strategies, such as taking medicines while you are also receiving in-person counseling. Using these two strategies together   makes you more likely to succeed in quitting than if you used either strategy on its own. If you are pregnant or breastfeeding, talk with your health care provider about finding counseling or other support strategies to quit smoking. Do not take medicine to help you quit smoking unless told to do so by your health care  provider. What things can I do to make it easier to quit? Quitting smoking might feel overwhelming at first, but there is a lot that you can do to make it easier. Take these important actions:  Reach out to your family and friends and ask that they support and encourage you during this time. Call telephone quitlines, reach out to support groups, or work with a counselor for support.  Ask people who smoke to avoid smoking around you.  Avoid places that trigger you to smoke, such as bars, parties, or smoke-break areas at work.  Spend time around people who do not smoke.  Lessen stress in your life, because stress can be a smoking trigger for some people. To lessen stress, try: ? Exercising regularly. ? Deep-breathing exercises. ? Yoga. ? Meditating. ? Performing a body scan. This involves closing your eyes, scanning your body from head to toe, and noticing which parts of your body are particularly tense. Purposefully relax the muscles in those areas.  Download or purchase mobile phone or tablet apps (applications) that can help you stick to your quit plan by providing reminders, tips, and encouragement. There are many free apps, such as QuitGuide from the CDC (Centers for Disease Control and Prevention). You can find other support for quitting smoking (smoking cessation) through smokefree.gov and other websites.  How will I feel when I quit smoking? Within the first 24 hours of quitting smoking, you may start to feel some withdrawal symptoms. These symptoms are usually most noticeable 2-3 days after quitting, but they usually do not last beyond 2-3 weeks. Changes or symptoms that you might experience include:  Mood swings.  Restlessness, anxiety, or irritation.  Difficulty concentrating.  Dizziness.  Strong cravings for sugary foods in addition to nicotine.  Mild weight gain.  Constipation.  Nausea.  Coughing or a sore throat.  Changes in how your medicines work in your  body.  A depressed mood.  Difficulty sleeping (insomnia).  After the first 2-3 weeks of quitting, you may start to notice more positive results, such as:  Improved sense of smell and taste.  Decreased coughing and sore throat.  Slower heart rate.  Lower blood pressure.  Clearer skin.  The ability to breathe more easily.  Fewer sick days.  Quitting smoking is very challenging for most people. Do not get discouraged if you are not successful the first time. Some people need to make many attempts to quit before they achieve long-term success. Do your best to stick to your quit plan, and talk with your health care provider if you have any questions or concerns. This information is not intended to replace advice given to you by your health care provider. Make sure you discuss any questions you have with your health care provider. Document Released: 06/21/2001 Document Revised: 02/23/2016 Document Reviewed: 11/11/2014 Elsevier Interactive Patient Education  2017 [employer]  

## 2016-10-06 ENCOUNTER — Telehealth: Payer: Self-pay | Admitting: *Deleted

## 2016-10-06 NOTE — Telephone Encounter (Signed)
Requesting OT orders for 2wk4.  Approval given.

## 2016-10-12 DIAGNOSIS — S12691D Other nondisplaced fracture of seventh cervical vertebra, subsequent encounter for fracture with routine healing: Secondary | ICD-10-CM | POA: Diagnosis not present

## 2016-10-12 DIAGNOSIS — M542 Cervicalgia: Secondary | ICD-10-CM | POA: Diagnosis not present

## 2016-10-12 DIAGNOSIS — D62 Acute posthemorrhagic anemia: Secondary | ICD-10-CM | POA: Diagnosis not present

## 2016-10-12 DIAGNOSIS — S14129D Central cord syndrome at unspecified level of cervical spinal cord, subsequent encounter: Secondary | ICD-10-CM | POA: Diagnosis not present

## 2016-10-12 DIAGNOSIS — M4802 Spinal stenosis, cervical region: Secondary | ICD-10-CM | POA: Diagnosis not present

## 2016-10-12 DIAGNOSIS — S12291S Other nondisplaced fracture of third cervical vertebra, sequela: Secondary | ICD-10-CM | POA: Diagnosis not present

## 2016-10-12 DIAGNOSIS — G629 Polyneuropathy, unspecified: Secondary | ICD-10-CM | POA: Diagnosis not present

## 2016-10-12 DIAGNOSIS — F17210 Nicotine dependence, cigarettes, uncomplicated: Secondary | ICD-10-CM

## 2016-10-12 DIAGNOSIS — F1020 Alcohol dependence, uncomplicated: Secondary | ICD-10-CM | POA: Diagnosis not present

## 2016-10-12 DIAGNOSIS — G8220 Paraplegia, unspecified: Secondary | ICD-10-CM | POA: Diagnosis not present

## 2016-10-17 ENCOUNTER — Other Ambulatory Visit (HOSPITAL_COMMUNITY): Payer: Self-pay | Admitting: Neurosurgery

## 2016-10-17 ENCOUNTER — Other Ambulatory Visit: Payer: Self-pay | Admitting: Neurosurgery

## 2016-10-17 DIAGNOSIS — S14121D Central cord syndrome at C1 level of cervical spinal cord, subsequent encounter: Secondary | ICD-10-CM

## 2016-10-19 ENCOUNTER — Ambulatory Visit: Payer: Medicaid Other | Attending: Family Medicine | Admitting: Family Medicine

## 2016-10-19 ENCOUNTER — Encounter: Payer: Self-pay | Admitting: Family Medicine

## 2016-10-19 VITALS — BP 146/92 | HR 89 | Temp 98.5°F | Resp 18 | Ht 72.0 in | Wt 141.4 lb

## 2016-10-19 DIAGNOSIS — Z09 Encounter for follow-up examination after completed treatment for conditions other than malignant neoplasm: Secondary | ICD-10-CM

## 2016-10-19 DIAGNOSIS — M5412 Radiculopathy, cervical region: Secondary | ICD-10-CM | POA: Insufficient documentation

## 2016-10-19 DIAGNOSIS — H269 Unspecified cataract: Secondary | ICD-10-CM | POA: Diagnosis not present

## 2016-10-19 DIAGNOSIS — M4712 Other spondylosis with myelopathy, cervical region: Secondary | ICD-10-CM | POA: Diagnosis not present

## 2016-10-19 DIAGNOSIS — H547 Unspecified visual loss: Secondary | ICD-10-CM | POA: Diagnosis not present

## 2016-10-19 DIAGNOSIS — S14129D Central cord syndrome at unspecified level of cervical spinal cord, subsequent encounter: Secondary | ICD-10-CM | POA: Diagnosis not present

## 2016-10-19 DIAGNOSIS — Z79899 Other long term (current) drug therapy: Secondary | ICD-10-CM | POA: Insufficient documentation

## 2016-10-19 DIAGNOSIS — N281 Cyst of kidney, acquired: Secondary | ICD-10-CM | POA: Diagnosis not present

## 2016-10-19 DIAGNOSIS — G959 Disease of spinal cord, unspecified: Secondary | ICD-10-CM | POA: Insufficient documentation

## 2016-10-19 DIAGNOSIS — F17210 Nicotine dependence, cigarettes, uncomplicated: Secondary | ICD-10-CM | POA: Diagnosis not present

## 2016-10-19 DIAGNOSIS — F1010 Alcohol abuse, uncomplicated: Secondary | ICD-10-CM | POA: Diagnosis not present

## 2016-10-19 MED ORDER — THERA M PLUS PO TABS: 1.0000 | ORAL_TABLET | Freq: Every day | ORAL | 1 refills | Status: DC

## 2016-10-19 MED ORDER — GABAPENTIN 300 MG PO CAPS: 300.0000 mg | ORAL_CAPSULE | Freq: Two times a day (BID) | ORAL | 2 refills | Status: DC

## 2016-10-19 MED ORDER — BACLOFEN 10 MG PO TABS: 5.0000 mg | ORAL_TABLET | Freq: Three times a day (TID) | ORAL | 0 refills | Status: DC

## 2016-10-19 NOTE — Progress Notes (Signed)
Subjective:  Patient ID: Patrick Romero, male    DOB: [birth year]  Age: 56 y.o. MRN: [id number]  CC: Establish Care   HPI Patrick Romero presents for   Cervical myelopathy and radiculopathy: Reports following up with neurologist appointment 2 weeks ago, with next scheduled appointment on May 3rd. He reports plan is to obtain another MRI to determine if surgical intervention may be needed. Reports upcoming rehab appointment in May. Reports not taking multivitamin and Lyrica due to prescription cost. Denies any relief of muscle spasms with flexeril use. Spasms exacerbated with standing. Denies any bowel or bladder incontinence. Reports having daily spasms. Still reports receiving HH services twice a week. Report from last visit showed patient had dizziness and hypotension with SBP 90. He denies taking anything for blood pressure. Denies any hear any CP, SOB, or heart palpitations .Denies any recent vision changes. He reports history of cataracts with most recent opthalmologic exam being last year.Current everyday smoker 1/2 pack per day. Smoking history of  20 years. Not ready to quit at this time. Reports last alcoholic drink was 1 week ago. Reports drinking 4 beers. Denies counseling resources at this time.   Renal cyst: CT ABD showed possible renal cyst. Follow up with renal US was recommended. Denies any hematuria, dysuria, or flank pain.    Outpatient Medications Prior to Visit  Medication Sig Dispense Refill  . pantoprazole (PROTONIX) 40 MG tablet Take 1 tablet (40 mg total) by mouth daily. 30 tablet 1  . polyethylene glycol (MIRALAX / GLYCOLAX) packet Take 17 g by mouth 2 (two) times daily. 14 each 0  . traMADol (ULTRAM) 50 MG tablet Take 1 tablet (50 mg total) by mouth every 6 (six) hours as needed for moderate pain. 45 tablet 0  . cyclobenzaprine (FLEXERIL) 10 MG tablet Take 1 tablet (10 mg total) by mouth 3 (three) times daily as needed for muscle spasms. 90 tablet 0  . Multiple  Vitamins-Minerals (MULTIVITAMINS THER. W/MINERALS) TABS tablet Take 1 tablet by mouth daily. 30 each 1  . pregabalin (LYRICA) 100 MG capsule Take 1 capsule (100 mg total) by mouth 3 (three) times daily. 90 capsule 1   No facility-administered medications prior to visit.     ROS Review of Systems  Eyes: Positive for visual disturbance (history of cataracts).  Respiratory: Negative.   Cardiovascular: Negative.   Gastrointestinal: Negative.   Musculoskeletal: Positive for back pain, myalgias and neck pain.  Neurological: Negative.     Objective:  BP (!) 146/92 (BP Location: Left Arm, Patient Position: Sitting, Cuff Size: Normal)   Pulse 89   Temp 98.5 F (36.9 C) (Oral)   Resp 18   Ht 6' (1.829 m)   Wt 141 lb 6.4 oz (64.1 kg)   SpO2 99%   BMI 19.18 kg/m   BP/Weight 10/19/2016 10/04/2016 3/41/9622  Systolic BP 297 989 92  Diastolic BP 92 87 67  Wt. (Lbs) 141.4 137.2 -  BMI 19.18 18.61 -    Physical Exam  Eyes: Conjunctivae are normal. Pupils are equal, round, and reactive to light.  Neck: No JVD present.  Cardiovascular: Normal rate, regular rhythm, normal heart sounds and intact distal pulses.   Pulmonary/Chest: Effort normal and breath sounds normal.  Abdominal: Soft. Bowel sounds are normal.  Musculoskeletal:       Right shoulder: He exhibits decreased range of motion.       Lumbar back: He exhibits pain.       Right hand: He exhibits decreased  range of motion. Decreased strength noted.  Decreased arm and hand strength to the RUE. Motor strength 4/5.   Skin: Skin is warm and dry.  Nursing note and vitals reviewed.   Assessment & Plan:   Problem List Items Addressed This Visit      Nervous and Auditory   Central cord syndrome ([place]) - Primary   - Will change flexeril to baclofen and evaluate improved symptom relief.   Relevant Medications   baclofen (LIORESAL) 10 MG tablet   Cervical myelopathy with cervical radiculopathy   -Lyrica 100 mg po TID changed to  gabapentin. Due to patient's inability to afford medication.   Relevant Medications   gabapentin (NEURONTIN) 300 MG capsule     Genitourinary   Renal cyst   Relevant Orders   US Renal     Other   ETOH abuse   Relevant Medications   Multiple Vitamins-Minerals (MULTIVITAMINS THER. W/MINERALS) TABS tablet    Other Visit Diagnoses    Follow up       Relevant Orders   US Renal   Decreased visual acuity       Relevant Orders   Ambulatory referral to Ophthalmology      Meds ordered this encounter  Medications  . gabapentin (NEURONTIN) 300 MG capsule    Sig: Take 1 capsule (300 mg total) by mouth 2 (two) times daily.    Dispense:  60 capsule    Refill:  2    Order Specific Question:   Supervising Provider    Answer:   Tresa Garter [id number]  . baclofen (LIORESAL) 10 MG tablet    Sig: Take 0.5 tablets (5 mg total) by mouth 3 (three) times daily.    Dispense:  90 each    Refill:  0    Order Specific Question:   Supervising Provider    Answer:   Tresa Garter [id number]  . Multiple Vitamins-Minerals (MULTIVITAMINS THER. W/MINERALS) TABS tablet    Sig: Take 1 tablet by mouth daily.    Dispense:  30 each    Refill:  1    Order Specific Question:   Supervising Provider    Answer:   Tresa Garter [id number]    Follow-up: Return if symptoms worsen or fail to improve. Return in  about 1 month (around 11/18/2016), for Central Cord Syndrome.   Alfonse Spruce FNP

## 2016-10-19 NOTE — Progress Notes (Signed)
Patient is here for central cord syndrome  Patient denies pain for today  Patient has taking his current medication for today  Patient has eaten for today

## 2016-10-19 NOTE — Patient Instructions (Addendum)
Baclofen tablets What is this medicine? BACLOFEN (BAK loe fen) helps relieve spasms and cramping of muscles. It may be used to treat symptoms of multiple sclerosis or spinal cord injury. This medicine may be used for other purposes; ask your health care provider or pharmacist if you have questions. COMMON BRAND NAME(S): ED Baclofen, Lioresal What should I tell my health care provider before I take this medicine? They need to know if you have any of these conditions: -kidney disease -seizures -stroke -an unusual or allergic reaction to baclofen, other medicines, foods, dyes, or preservatives -pregnant or trying to get pregnant -breast-feeding How should I use this medicine? Take this medicine by mouth. Swallow it with a drink of water. Follow the directions on the prescription label. Do not take more medicine than you are told to take. Talk to your pediatrician regarding the use of this medicine in children. Special care may be needed. Overdosage: If you think you have taken too much of this medicine contact a poison control center or emergency room at once. NOTE: This medicine is only for you. Do not share this medicine with others. What if I miss a dose? If you miss a dose, take it as soon as you can. If it is almost time for your next dose, take only that dose. Do not take double or extra doses. What may interact with this medicine? Do not take this medication with any of the following medicines: -narcotic medicines for cough This medicine may also interact with the following medications: -alcohol -antihistamines for allergy, cough and cold -certain medicines for anxiety or sleep -certain medicines for depression like amitriptyline, fluoxetine, sertraline -certain medicines for seizures like phenobarbital, primidone -general anesthetics like halothane, isoflurane, methoxyflurane, propofol -local anesthetics like lidocaine, pramoxine, tetracaine -medicines that relax muscles for  surgery -narcotic medicines for pain -phenothiazines like chlorpromazine, mesoridazine, prochlorperazine, thioridazine This list may not describe all possible interactions. Give your health care provider a list of all the medicines, herbs, non-prescription drugs, or dietary supplements you use. Also tell them if you smoke, drink alcohol, or use illegal drugs. Some items may interact with your medicine. What should I watch for while using this medicine? Tell your doctor or health care professional if your symptoms do not start to get better or if they get worse. Do not suddenly stop taking your medicine. If you do, you may develop a severe reaction. If your doctor wants you to stop the medicine, the dose will be slowly lowered over time to avoid any side effects. Follow the advice of your doctor. You may get drowsy or dizzy. Do not drive, use machinery, or do anything that needs mental alertness until you know how this medicine affects you. Do not stand or sit up quickly, especially if you are an older patient. This reduces the risk of dizzy or fainting spells. Alcohol may interfere with the effect of this medicine. Avoid alcoholic drinks. If you are taking another medicine that also causes drowsiness, you may have more side effects. Give your health care provider a list of all medicines you use. Your doctor will tell you how much medicine to take. Do not take more medicine than directed. Call emergency for help if you have problems breathing or unusual sleepiness. What side effects may I notice from receiving this medicine? Side effects that you should report to your doctor or health care professional as soon as possible: -allergic reactions like skin rash, itching or hives, swelling of the face, lips, or tongue -breathing   problems -changes in emotions or moods -changes in vision -chest pain -fast, irregular heartbeat -feeling faint or lightheaded, falls -hallucinations -loss of balance or  coordination -ringing of the ears -seizures -trouble passing urine or change in the amount of urine -trouble walking -unusually weak or tired Side effects that usually do not require medical attention (report to your doctor or health care professional if they continue or are bothersome): -changes in taste -confusion -constipation -diarrhea -dry mouth -headache -muscle weakness -nausea, vomiting -trouble sleeping This list may not describe all possible side effects. Call your doctor for medical advice about side effects. You may report side effects to FDA at 1-800-FDA-1088. Where should I keep my medicine? Keep out of the reach of children. Store at room temperature between 15 and 30 degrees C (59 and 86 degrees F). Keep container tightly closed. Throw away any unused medicine after the expiration date. NOTE: This sheet is a summary. It may not cover all possible information. If you have questions about this medicine, talk to your doctor, pharmacist, or health care provider.  2018 Elsevier/Gold Standard (2015-04-06 15:56:23)   Muscle Cramps and Spasms Muscle cramps and spasms occur when a muscle or muscles tighten and you have no control over this tightening (involuntary muscle contraction). They are a common problem and can develop in any muscle. The most common place is in the calf muscles of the leg. Muscle cramps and muscle spasms are both involuntary muscle contractions, but there are some differences between the two:  Muscle cramps are painful. They come and go and may last a few seconds to 15 minutes. Muscle cramps are often more forceful and last longer than muscle spasms.  Muscle spasms may or may not be painful. They may also last just a few seconds or much longer. Certain medical conditions, such as diabetes or Parkinson disease, can make it more likely to develop cramps or spasms. However, cramps or spasms are usually not caused by a serious underlying problem. Common causes  include:  Overexertion.  Overuse from repetitive motions, or doing the same thing over and over.  Remaining in a certain position for a long period of time.  Improper preparation, form, or technique while playing a sport or doing an activity.  Dehydration.  Injury.  Side effects of some medicines.  Abnormally low levels of the salts and ions in your blood (electrolytes), especially potassium and calcium. This could happen if you are taking water pills (diuretics) or if you are pregnant. In many cases, the cause of muscle cramps or spasms is unknown. Follow these instructions at home:  Stay well hydrated. Drink enough fluid to keep your urine clear or pale yellow.  Try massaging, stretching, and relaxing the affected muscle.  If directed, apply heat to tight or tense muscles as often as told by your health care provider. Use the heat source that your health care provider recommends, such as a moist heat pack or a heating pad.  Place a towel between your skin and the heat source.  Leave the heat on for 20-30 minutes.  Remove the heat if your skin turns bright red. This is especially important if you are unable to feel pain, heat, or cold. You may have a greater risk of getting burned.  If directed, put ice on the affected area. This may help if you are sore or have pain after a cramp or spasm.  Put ice in a plastic bag.  Place a towel between your skin and the bag.  Leavethe ice on for 20 minutes, 2-3 times a day.  Take over-the-counter and prescription medicines only as told by your health care provider.  Pay attention to any changes in your symptoms. Contact a health care provider if:  Your cramps or spasms get more severe or happen more often.  Your cramps or spasms do not improve over time. This information is not intended to replace advice given to you by your health care provider. Make sure you discuss any questions you have with your health care  provider. Document Released: 12/17/2001 Document Revised: 07/29/2015 Document Reviewed: 03/31/2015 Elsevier Interactive Patient Education  2017 [employer].

## 2016-10-21 ENCOUNTER — Telehealth: Payer: Self-pay | Admitting: Family Medicine

## 2016-10-21 NOTE — Telephone Encounter (Signed)
Jerralyn from [employer] called the office asking to speak with nurse regarding getting clarification of patient's medication. Please follow up.  Thank you.

## 2016-10-24 ENCOUNTER — Other Ambulatory Visit: Payer: Self-pay | Admitting: Family Medicine

## 2016-10-24 ENCOUNTER — Telehealth: Payer: Self-pay | Admitting: Family Medicine

## 2016-10-24 DIAGNOSIS — I10 Essential (primary) hypertension: Secondary | ICD-10-CM

## 2016-10-24 MED ORDER — AMLODIPINE BESYLATE 5 MG PO TABS: ORAL_TABLET | ORAL | 0 refills | Status: DC

## 2016-10-24 NOTE — Telephone Encounter (Signed)
Diane from [facility] called to inform PCP that patient's bp has been elevated in the past appointments. On Friday it was 152/95. Today 148/95.    Thank you.

## 2016-10-24 NOTE — Telephone Encounter (Signed)
Will place order for amlodipine 2.5 mg QD to our pharmacy. Patient needs to schedule office appointment to evaluated within the next 2 weeks for elevated BP's.

## 2016-10-24 NOTE — Progress Notes (Unsigned)
Office received a call from [place] at [facility]. She reported that patient's BP have been elevated on two separate visits. Last Friday it was 152/95. Today it was 148/95. Prescribed amlodipine 2.5 mg QD. Recommend patient schedule follow up appointment within the 2 weeks to evaluate elevated BP's.

## 2016-10-24 NOTE — Telephone Encounter (Signed)
Diane from [facility] called to inform PCP that patient's bp has been elevated in the past appointments. On Friday it was 152/95. Today 148/95.    Thank you.

## 2016-10-25 NOTE — Telephone Encounter (Signed)
CMA call patient to let him know that his PCP place in order for high BP medication   Patient did not answer but left a detailed message & if have any questions just to call back at the office

## 2016-10-26 ENCOUNTER — Encounter: Payer: Self-pay | Admitting: Physical Medicine & Rehabilitation

## 2016-10-26 ENCOUNTER — Encounter: Payer: Medicaid Other | Attending: Physical Medicine & Rehabilitation | Admitting: Physical Medicine & Rehabilitation

## 2016-10-26 VITALS — BP 123/79 | HR 109

## 2016-10-26 DIAGNOSIS — S14109S Unspecified injury at unspecified level of cervical spinal cord, sequela: Secondary | ICD-10-CM

## 2016-10-26 DIAGNOSIS — X58XXXA Exposure to other specified factors, initial encounter: Secondary | ICD-10-CM | POA: Insufficient documentation

## 2016-10-26 DIAGNOSIS — S14129D Central cord syndrome at unspecified level of cervical spinal cord, subsequent encounter: Secondary | ICD-10-CM | POA: Diagnosis not present

## 2016-10-26 DIAGNOSIS — M542 Cervicalgia: Secondary | ICD-10-CM | POA: Insufficient documentation

## 2016-10-26 DIAGNOSIS — S14129A Central cord syndrome at unspecified level of cervical spinal cord, initial encounter: Secondary | ICD-10-CM | POA: Insufficient documentation

## 2016-10-26 DIAGNOSIS — D649 Anemia, unspecified: Secondary | ICD-10-CM | POA: Diagnosis not present

## 2016-10-26 DIAGNOSIS — G8250 Quadriplegia, unspecified: Secondary | ICD-10-CM

## 2016-10-26 DIAGNOSIS — G8929 Other chronic pain: Secondary | ICD-10-CM | POA: Diagnosis not present

## 2016-10-26 DIAGNOSIS — M545 Low back pain: Secondary | ICD-10-CM | POA: Diagnosis not present

## 2016-10-26 DIAGNOSIS — M7501 Adhesive capsulitis of right shoulder: Secondary | ICD-10-CM | POA: Diagnosis not present

## 2016-10-26 DIAGNOSIS — M25511 Pain in right shoulder: Secondary | ICD-10-CM | POA: Insufficient documentation

## 2016-10-26 DIAGNOSIS — S14129S Central cord syndrome at unspecified level of cervical spinal cord, sequela: Secondary | ICD-10-CM | POA: Diagnosis not present

## 2016-10-26 DIAGNOSIS — F17210 Nicotine dependence, cigarettes, uncomplicated: Secondary | ICD-10-CM | POA: Insufficient documentation

## 2016-10-26 DIAGNOSIS — N281 Cyst of kidney, acquired: Secondary | ICD-10-CM | POA: Insufficient documentation

## 2016-10-26 MED ORDER — BACLOFEN 10 MG PO TABS: 10.0000 mg | ORAL_TABLET | Freq: Three times a day (TID) | ORAL | 0 refills | Status: DC

## 2016-10-26 MED ORDER — DICLOFENAC SODIUM 75 MG PO TBEC: 75.0000 mg | DELAYED_RELEASE_TABLET | Freq: Two times a day (BID) | ORAL | 3 refills | Status: DC

## 2016-10-26 NOTE — Patient Instructions (Signed)
IF YOU RECEIVE MEDICAID BEFORE YOU RETURN TO SEE ME, CALL AND I WILL REFER YOU FOR OUTPATIENT THERAPY  YOU MUST WORK ON YOUR RIGHT SHOULDER RANGE OF MOTION EVERY DAY!!!

## 2016-10-26 NOTE — Progress Notes (Signed)
Subjective:    Patient ID: Patrick Romero, male    DOB: 10/15/1960, 56 y.o.   MRN: [id number]  HPI   Patrick Romero is here in follow up of his cervical central cord syndrome. He was discharged from inpatient rehab almost 3 weeks ago to home.  He states that he is improving from a standpoint of his right arm. "I am still having some of the same aches and pains" including spasms in the arms, legs and neck. He is doing stretches and is getting therapy 2 x a week via [facility], OT. He is able to do a lot of his self-care activities on his own. He is still having more weakness in the right arm.  His bowel and bladder are functioning well. His sleep is broken by his pain.   He denies depression. Appetite is good. He's had no dizziness or disorientation from medications. He is using the tramadol 3 x daily for pain relief. Baclofen is 5mg  TID currently   Pain Inventory Average Pain 8 Pain Right Now 8 My pain is sharp, burning, stabbing and aching  In the last 24 hours, has pain interfered with the following? General activity 7 Relation with others 7 Enjoyment of life 7 What TIME of day is your pain at its worst? . Sleep (in general) .  Pain is worse with: walking, bending, standing and some activites Pain improves with: rest, heat/ice and medication Relief from Meds: 4  Mobility walk with assistance use a cane use a walker ability to climb steps?  yes do you drive?  no  Function Do you have any goals in this area?  yes  Neuro/Psych weakness numbness tremor tingling trouble walking spasms dizziness confusion depression anxiety  Prior Studies Any changes since last visit?  no  Physicians involved in your care Any changes since last visit?  no   Family History  Problem Relation Age of Onset  . Hypertension Mother   . Lung cancer Father   . COPD Sister    Social History   Social History  . Marital status: Single    Spouse name: N/A  . Number of children: N/A  .  Years of education: N/A   Social History Main Topics  . Smoking status: Current Every Day Smoker    Packs/day: 0.25    Types: Cigarettes  . Smokeless tobacco: Never Used  . Alcohol use Yes     Comment: 2-40oz beers/day  . Drug use: Yes    Types: Marijuana  . Sexual activity: Yes   Other Topics Concern  . Not on file   Social History Narrative  . No narrative on file   No past surgical history on file. Past Medical History:  Diagnosis Date  . Chronic back pain   . Chronic neck pain   . Herniated disc, cervical    There were no vitals taken for this visit.  Opioid Risk Score:   Fall Risk Score:  `1  Depression screen PHQ 2/9  No flowsheet data found.  Review of Systems  Constitutional: Positive for diaphoresis.  HENT: Negative.   Eyes: Negative.   Respiratory: Positive for shortness of breath.   Cardiovascular: Negative.   Gastrointestinal: Negative.   Endocrine: Negative.   Genitourinary: Negative.   Musculoskeletal: Negative.   Skin: Negative.   Allergic/Immunologic: Negative.   Neurological: Negative.   Hematological: Negative.   Psychiatric/Behavioral: Negative.   All other systems reviewed and are negative.      Objective:  Physical Exam HEENT: Poor dentition. Normocephalic. Atraumatic.  Cardio: RRR Neck: C-collar in place. fitting Resp: CTA bilaterally normal effort GI: BS NT Skin:   CDI  Neuro: Alert and oriented x  34/5 left deltoid, biceps,4 triceps, wrist extension 4/5, hand grip  4-/5 . 3-/5 right deltoid muscle, biceps,3 triceps, wrist extension. Hand grip 2+ to 3-.  LLE: 4-/5 at the ankle dorsiflexor, plantar flexor, 4   KE/HF RLE: 3+ HF, 3/5 KE and 3/5 ADF/PF.  Ambulates with fair weight shift, narrow base of support. Lost balance once to the right but was able to recover. Uses cane for support.  Musc: right shoulder with 1/4" sublux. Passive abduction to 75 degrees, decreased ER/IR with passive movement---tightness limits ability to lift  arm over head. Pt had pain with end range of motion as well.  Psych pleasant and appropriate.   Gen.: NAD        Assessment & Plan:  1. 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             --continue HH therapies  -transition to outpt once he gets his medicaid 2.  Pain Management: dc tramadol  -trial of diclofenac 60mg  BID  -gabapentin for neuropathic pain 3. Spasticity: increase baclofen back to 10mg  TID  -continue gait/strengthening/ROM 4. Anemia:follow up per primary10. Mid chest wall pain: Resolved               5. Right renal cyst: Will need non-emergent follow up ultrasound for work up.  6. . ETOH abuse/Tobacco: continue etoh abstinence 7. Right shoulder pain/capsulitis: active/aggressive stretching. Pt stated that he has exercises at home  -diclofenac and baclofen  -heat  Fifteen minutes of face to face patient care time were spent during this visit. All questions were encouraged and answered. Folllow up in 6 weeks.

## 2016-10-27 ENCOUNTER — Ambulatory Visit (HOSPITAL_COMMUNITY)
Admission: RE | Admit: 2016-10-27 | Discharge: 2016-10-27 | Disposition: A | Discharge status: Home / Self Care | Payer: Medicaid Other | Source: Ambulatory Visit | Attending: Family Medicine | Admitting: Family Medicine

## 2016-10-27 ENCOUNTER — Telehealth: Payer: Self-pay | Admitting: Physical Medicine & Rehabilitation

## 2016-10-27 DIAGNOSIS — Z09 Encounter for follow-up examination after completed treatment for conditions other than malignant neoplasm: Secondary | ICD-10-CM

## 2016-10-27 DIAGNOSIS — N281 Cyst of kidney, acquired: Secondary | ICD-10-CM | POA: Diagnosis not present

## 2016-10-27 NOTE — Telephone Encounter (Signed)
Called diane and authorized verbal orders

## 2016-10-27 NOTE — Telephone Encounter (Signed)
Diane OT with [facility] needs to get a verbal to continue OT with patient for 2w2 starting next week.  Please call her at [phone].

## 2016-10-28 NOTE — Telephone Encounter (Signed)
Medical Assistant left message on patient's home and cell voicemail. Voicemail states to give a call back to Nubia with [facility] at [phone].  

## 2016-10-31 ENCOUNTER — Telehealth: Payer: Self-pay | Admitting: Family Medicine

## 2016-10-31 ENCOUNTER — Ambulatory Visit (HOSPITAL_COMMUNITY)
Admission: RE | Admit: 2016-10-31 | Discharge: 2016-10-31 | Disposition: A | Discharge status: Home / Self Care | Payer: Self-pay | Source: Ambulatory Visit | Attending: Neurosurgery | Admitting: Neurosurgery

## 2016-10-31 ENCOUNTER — Other Ambulatory Visit: Payer: Self-pay

## 2016-10-31 ENCOUNTER — Encounter (HOSPITAL_COMMUNITY): Payer: Self-pay

## 2016-10-31 ENCOUNTER — Telehealth: Payer: Self-pay

## 2016-10-31 DIAGNOSIS — I10 Essential (primary) hypertension: Secondary | ICD-10-CM

## 2016-10-31 MED ORDER — AMLODIPINE BESYLATE 5 MG PO TABS: ORAL_TABLET | ORAL | 0 refills | Status: DC

## 2016-10-31 NOTE — Telephone Encounter (Signed)
Caller calling in to inform that pt has elevated bp of 152/105 today and is experiencing pain (8 out of 10). Please f/u. Thank you.

## 2016-10-31 NOTE — Telephone Encounter (Signed)
Oliva Bustard PT with [facility] has called with concern regarding patients Blood pressure. This morning OT has noted patient blood pressure was 152/105. This afternoon around 3 pm patient blood pressure was 163/100. Also, Oliva Bustard states patient isn't taking Norvasc and does not have an rx for that medication. "Patient does not have a headache or any signs of distress". Oliva Bustard can be reached at [phone] if there are any questions or concerns. [facility] has contacted Dr.Harrison and LVM regarding patient.  Norvasc has been order 5 mg Per Dr Naaman Plummer

## 2016-10-31 NOTE — Telephone Encounter (Signed)
Attempt to f/u with patient about BP from OT visit today. Left message on voicemail to return call.

## 2016-11-01 ENCOUNTER — Other Ambulatory Visit: Payer: Self-pay | Admitting: Family Medicine

## 2016-11-01 DIAGNOSIS — N281 Cyst of kidney, acquired: Secondary | ICD-10-CM

## 2016-11-01 NOTE — Telephone Encounter (Signed)
CMA call  Regarding MRI appt on 11/09/2016 @ 5:00 pm @ [facility]  Patient did not answer but left a detailed message with the information & to call back if have any questions

## 2016-11-01 NOTE — Telephone Encounter (Signed)
CMA call regarding x ray results   Patient Verify DOB   Patient was aware and understood  

## 2016-11-01 NOTE — Telephone Encounter (Signed)
-----   Message from Alfonse Spruce, [place] sent at 11/01/2016  6:11 AM EDT ----- Right kidney cyst v.s. nodule could not be fully determined. MRI of kidney has been recommended for further evaluation.  Left kidney normal.

## 2016-11-08 ENCOUNTER — Telehealth: Payer: Self-pay | Admitting: *Deleted

## 2016-11-08 DIAGNOSIS — G8250 Quadriplegia, unspecified: Secondary | ICD-10-CM

## 2016-11-08 DIAGNOSIS — M7501 Adhesive capsulitis of right shoulder: Secondary | ICD-10-CM

## 2016-11-08 DIAGNOSIS — S14129S Central cord syndrome at unspecified level of cervical spinal cord, sequela: Secondary | ICD-10-CM

## 2016-11-08 NOTE — Telephone Encounter (Signed)
Patrick Romero called and states he needs letter detailing injuries and why he cannot return to work.

## 2016-11-09 ENCOUNTER — Ambulatory Visit (HOSPITAL_COMMUNITY): Admission: RE | Admit: 2016-11-09 | Payer: Medicaid Other | Source: Ambulatory Visit

## 2016-11-09 NOTE — Telephone Encounter (Signed)
Letter written

## 2016-11-09 NOTE — Telephone Encounter (Signed)
Letter mailed.  And Mr Antilla notified. Mr Blouch asked that it be mailed to his mothers address where he is staying right now. [phone]  [place] [id number]). Done

## 2016-11-14 ENCOUNTER — Telehealth: Payer: Self-pay | Admitting: *Deleted

## 2016-11-14 DIAGNOSIS — I10 Essential (primary) hypertension: Secondary | ICD-10-CM

## 2016-11-14 MED ORDER — AMLODIPINE BESYLATE 5 MG PO TABS: ORAL_TABLET | ORAL | 4 refills | Status: DC

## 2016-11-14 NOTE — Telephone Encounter (Signed)
A message was left on my voicemail concerning Patrick Romero's BP.  I called Patrick Romero to ask about the message.  She says that Patrick Romero's BP was 152/105 and that Dr Naaman Plummer had ordered him to take amlodipine 5 mg 1/2 tablet but she is concerned that it was not working for him.  She said that she spoke to Patrick Romero on Friday and that he relayed Dr Naaman Plummer was not in office but he would speak with him on Monday about it. She told the patient that if his BP went higher he would need to go to the ED or urgent care over the weekend.  She has not seen him today. She says that she has discharged him from OT/PT and he will need orders placed for outpt rehab. (orders placed) I called and spoke with Patrick Romero to see how he was after the weekend and if he had rechecked his BP.  He said that he did on Sunday and it was 140/102 . I told him I would speak with Dr Naaman Plummer and call him back.

## 2016-11-14 NOTE — Addendum Note (Signed)
Addended by: Caro Hight on: 11/14/2016 12:11 PM   Modules accepted: Orders

## 2016-11-14 NOTE — Telephone Encounter (Signed)
Sent 5mg  rx for amlodipine to pharmacy

## 2016-11-14 NOTE — Telephone Encounter (Signed)
Orders placed for OT /PT outpt rehab. (orders attached to previous phone message about letter)

## 2016-11-14 NOTE — Telephone Encounter (Signed)
Mr Clouatre notified. When I told him that he was to go ahead and take the whole tablet, he says that he had two prescriptions one at [facility] and one at [facility] and the one from [facility] was for the whole tablet and he says that he has been taking the whole tablet since Thursday. I asked if he let the OT  know that information and he did not.  I told him to go to see his pcp at [facility] tomorrow and let them check his bp and make adjustments to his medications.

## 2016-11-14 NOTE — Telephone Encounter (Signed)
Out pt rehab orders for PT/OT placed (in error on this phone message -- request made 11/14/16 on another phone message from  [place] [facility] [place])

## 2016-11-16 ENCOUNTER — Ambulatory Visit (HOSPITAL_COMMUNITY)
Admission: RE | Admit: 2016-11-16 | Discharge: 2016-11-16 | Disposition: A | Discharge status: Home / Self Care | Payer: Medicaid Other | Source: Ambulatory Visit | Attending: Family Medicine | Admitting: Family Medicine

## 2016-11-16 DIAGNOSIS — N281 Cyst of kidney, acquired: Secondary | ICD-10-CM | POA: Diagnosis present

## 2016-11-16 DIAGNOSIS — I700 Atherosclerosis of aorta: Secondary | ICD-10-CM | POA: Insufficient documentation

## 2016-11-16 DIAGNOSIS — R93421 Abnormal radiologic findings on diagnostic imaging of right kidney: Secondary | ICD-10-CM | POA: Insufficient documentation

## 2016-11-16 LAB — CREATININE, SERUM
CREATININE: 0.87 mg/dL (ref 0.61–1.24)
GFR calc Af Amer: >60 mL/min (ref >60)

## 2016-11-16 MED ORDER — GADOBENATE DIMEGLUMINE 529 MG/ML IV SOLN
14.0000 mL | Freq: Once | INTRAVENOUS | Status: AC | PRN
  Administered 2016-11-16: 14 mL via INTRAVENOUS

## 2016-11-18 ENCOUNTER — Ambulatory Visit: Payer: Self-pay | Admitting: Family Medicine

## 2016-11-22 ENCOUNTER — Other Ambulatory Visit: Payer: Self-pay | Admitting: Neurosurgery

## 2016-11-22 DIAGNOSIS — S14121D Central cord syndrome at C1 level of cervical spinal cord, subsequent encounter: Secondary | ICD-10-CM

## 2016-11-23 ENCOUNTER — Ambulatory Visit: Payer: Medicaid Other | Attending: Family Medicine | Admitting: Family Medicine

## 2016-11-23 ENCOUNTER — Telehealth: Payer: Self-pay

## 2016-11-23 ENCOUNTER — Other Ambulatory Visit: Payer: Self-pay | Admitting: Family Medicine

## 2016-11-23 ENCOUNTER — Encounter: Payer: Self-pay | Admitting: Family Medicine

## 2016-11-23 VITALS — BP 128/88 | HR 106 | Temp 98.8°F | Resp 16 | Wt 141.8 lb

## 2016-11-23 DIAGNOSIS — G959 Disease of spinal cord, unspecified: Secondary | ICD-10-CM | POA: Insufficient documentation

## 2016-11-23 DIAGNOSIS — I10 Essential (primary) hypertension: Secondary | ICD-10-CM | POA: Insufficient documentation

## 2016-11-23 DIAGNOSIS — M5412 Radiculopathy, cervical region: Secondary | ICD-10-CM | POA: Diagnosis present

## 2016-11-23 DIAGNOSIS — C641 Malignant neoplasm of right kidney, except renal pelvis: Secondary | ICD-10-CM | POA: Diagnosis not present

## 2016-11-23 DIAGNOSIS — C649 Malignant neoplasm of unspecified kidney, except renal pelvis: Secondary | ICD-10-CM | POA: Insufficient documentation

## 2016-11-23 DIAGNOSIS — F17210 Nicotine dependence, cigarettes, uncomplicated: Secondary | ICD-10-CM | POA: Diagnosis not present

## 2016-11-23 DIAGNOSIS — I700 Atherosclerosis of aorta: Secondary | ICD-10-CM | POA: Insufficient documentation

## 2016-11-23 DIAGNOSIS — Z7982 Long term (current) use of aspirin: Secondary | ICD-10-CM | POA: Diagnosis not present

## 2016-11-23 DIAGNOSIS — S14129S Central cord syndrome at unspecified level of cervical spinal cord, sequela: Secondary | ICD-10-CM

## 2016-11-23 DIAGNOSIS — G8250 Quadriplegia, unspecified: Secondary | ICD-10-CM

## 2016-11-23 MED ORDER — ASPIRIN EC 81 MG PO TBEC: 81.0000 mg | DELAYED_RELEASE_TABLET | Freq: Every day | ORAL | 2 refills | Status: DC

## 2016-11-23 NOTE — Telephone Encounter (Signed)
Patient called, stated that he is now on medicaid

## 2016-11-23 NOTE — Patient Instructions (Addendum)
Kidney Cancer Kidney cancer is an abnormal growth of cells in the kidney that is cancerous (malignant). Unlike noncancerous (benign) tumors, malignant tumors can spread to other parts of your body. The kidneys are the organs that filter your blood and keep it clean. They move waste out of your blood and into your urine. Urine passes from the kidneys, through the ureters, and into the bladder. When you urinate, these wastes leave the body. What increases the risk? There are a number of risk factors that can increase your chances of getting kidney cancer. They include:  Age. The likelihood of developing kidney cancer increases with age.  Family history of kidney cancer.  Being [place] American, Native [place], or Native [place].  Smoking.  Being male.  Obesity.  High blood pressure (hypertension).  Advanced kidney disease, especially requiring long-term kidney dialysis.  Inherited conditions, such as von Hippel-Lindau disease, tuberous sclerosis, and familial papillary renal cell carcinoma.  Exposure to certain chemicals in the workplace. What are the signs or symptoms? Early kidney cancer often does not cause symptoms. As the cancer grows, symptoms may include:  Blood in the urine.  Abdominal pain or back pain.  Fatigue.  Unexplained weight loss.  Fever. How is this diagnosed? Your health care provider may ask about your medical history and perform a physical exam. Other tests that may be done include:  Blood and urine tests.  X-rays.  Imaging tests, such as CT scans, MRI, and PET scans.  Intravenous pyelogram.  Angiogram.  Examination of a tissue sample (biopsy) from your kidney. If kidney cancer is confirmed, it will be staged to determine its severity and extent. Staging is an assessment of the size of the tumor and if and where the cancer has spread. Cancer stages include:  Stage I. The cancer is found only in the kidney. The tumor may be up to 2 inches (7 cm)  wide.  Stage II. The cancer is found only in the kidney. The tumor is wider than 2 inches (7 cm).  Stage III. The cancer has spread to nearby tissues and possibly to the lymph nodes.  Stage IV. The cancer has spread to the lymph nodes and other parts of the body, such as the bones, brain, liver, or lungs. How is this treated? Treatment for kidney cancer depends on the type and stage of the cancer. Treatment may include one or more of the following:  Surgery. This may include surgery to remove:  Just the tumor (nephron-sparing surgery).  The entire kidney (nephrectomy).  The kidney, some of the surrounding healthy tissue and nearby lymph nodes, as well as the adrenal gland in certain cases (radical nephrectomy).  Radiation therapy. This uses high-energy rays to kill cancer cells.  Cryoablation. This uses a needle that delivers gas into the cancer cells to freeze them.  Radiofrequency ablation. This uses a needle to deliver high-energy radio waves that heat and burn the cancer cells.  Arterial embolization. This uses a tiny tube that is inserted through a groin artery and threaded up toward the kidney tumor. A substance is injected into the tube to block blood flow to the tumor, which causes it to die.  Drug therapy. Drugs may be used to help your body fight the cancer or to stop cancer cells from growing. This may include chemotherapy, biological therapy, and immunological therapy. Follow these instructions at home:  Take medicines, supplements, and herbal remedies only as directed by your health care provider.  Maintain a healthy diet.  Consider  joining a support group. This may help you learn to cope with the stress of having kidney cancer.  Seek advice to help you manage side effects of treatment.  Keep all follow-up appointments as directed by your health care provider. This is important. Contact a health care provider if:  You notice that you bruise or bleed  easily.  You notice that you are losing weight without trying.  You have new or increased fatigue or weakness. Get help right away if:  You have blood in your urine.  You have a sudden increase in pain.  You have a fever.  You are short of breath.  You have chest pain.  Your skin or your eyes are yellow. This information is not intended to replace advice given to you by your health care provider. Make sure you discuss any questions you have with your health care provider. Document Released: 05/07/2004 Document Revised: 12/03/2015 Document Reviewed: 12/10/2013 Elsevier Interactive Patient Education  2017 [employer].

## 2016-11-23 NOTE — Progress Notes (Signed)
Subjective:  Patient ID: Patrick Romero, male    DOB: June 22, 1961  Age: 56 y.o. MRN: [id number]  CC: No chief complaint on file.   HPI Patrick Romero presents for   Cervical myelopathy and radiculopathy: He reports following up with neurosurgeon on 4/18. He reports following up with neuro rehabilitation physician and has upcoming follow up 5/30. Reports upcoming imaging study of spine. He reports recent history of two falls. He denies head injury, syncope, or residual deficits. He reports falling backwards while walking down the steps and also falling forwards while walking in home.  Denies any bowel or bladder incontinence. History of hypertension. He reports adherence with medications. Denies any hear any CP, SOB, or heart palpitations. Denies any recent vision changes. He reports history of cataracts with most recent opthalmologic exam being last year. Referral placed at last office visit.Current everyday smoker 1/2 pack per day. Smoking history of  20 years. Not ready to quit at this time.  Renal mass: Renal ultrasound was followed up with MRI of abdomen. MRI finding of compatible with renal cell carcinoma. His significant other is present with him during office visit. Diagnosis disclosed to patient and s.o. He reports support system that includes his family. Denies any hematuria, dysuria, or flank pain. He does report low bilateral back pain with right sided muscle spasm. Reports having daily spasms. Spasms exacerbated with standing. Pain 7/10. He reports using narcotics and muscle relaxants with moderate symptoms.    Outpatient Medications Prior to Visit  Medication Sig Dispense Refill  . amLODipine (NORVASC) 5 MG tablet Take one tablet by mouth once a day. 30 tablet 4  . baclofen (LIORESAL) 10 MG tablet Take 1 tablet (10 mg total) by mouth 3 (three) times daily. 90 each 0  . cyclobenzaprine (FLEXERIL) 5 MG tablet TAKE 1 TABLET BY MOUTH 3 TIMES A DAY AS NEEDED FOR MUSCLE SPASM  0  .  diclofenac (VOLTAREN) 75 MG EC tablet Take 1 tablet (75 mg total) by mouth 2 (two) times daily with a meal. 60 tablet 3  . gabapentin (NEURONTIN) 300 MG capsule Take 1 capsule (300 mg total) by mouth 2 (two) times daily. 60 capsule 2  . Multiple Vitamins-Minerals (MULTIVITAMINS THER. W/MINERALS) TABS tablet Take 1 tablet by mouth daily. 30 each 1  . pantoprazole (PROTONIX) 40 MG tablet Take 1 tablet (40 mg total) by mouth daily. 30 tablet 1  . polyethylene glycol (MIRALAX / GLYCOLAX) packet Take 17 g by mouth 2 (two) times daily. 14 each 0  . aspirin EC 81 MG tablet Take 1 tablet (81 mg total) by mouth daily. (Patient not taking: Reported on 11/23/2016) 30 tablet 2   No facility-administered medications prior to visit.     ROS Review of Systems  Constitutional: Negative.   Eyes: Negative.   Respiratory: Negative.   Cardiovascular: Negative.   Gastrointestinal: Negative.   Musculoskeletal: Positive for back pain and myalgias.  Neurological:       Parathesias  Psychiatric/Behavioral: Negative.  Negative for suicidal ideas.    Objective:  BP 128/88 (BP Location: Left Arm, Patient Position: Sitting, Cuff Size: Normal)   Pulse (!) 106   Temp 98.8 F (37.1 C) (Oral)   Resp 16   Wt 141 lb 12.8 oz (64.3 kg)   SpO2 97%   BMI 19.23 kg/m   BP/Weight 11/23/2016 10/26/2016 0/71/2197  Systolic BP 588 325 498  Diastolic BP 88 79 92  Wt. (Lbs) 141.8 - 141.4  BMI 19.23 - 19.18  Physical Exam  Constitutional: He is oriented to person, place, and time. He appears well-developed and well-nourished.  HENT:  Head: Normocephalic and atraumatic.  Right Ear: External ear normal.  Left Ear: External ear normal.  Nose: Nose normal.  Eyes: Conjunctivae are normal. Pupils are equal, round, and reactive to light.  Neck: No JVD present.  Cardiovascular: Normal rate, regular rhythm, normal heart sounds and intact distal pulses.   Pulmonary/Chest: Effort normal and breath sounds normal.    Abdominal: Soft. Bowel sounds are normal.  Musculoskeletal:       Right shoulder: He exhibits decreased range of motion.       Lumbar back: He exhibits pain and spasm. Bony tenderness: bilateral back pain.       Right hand: He exhibits decreased range of motion. Decreased strength (Decreased arm and hand strength to the RUE. Motor strength 4/5 at baseline.) noted.  Neurological: He is alert and oriented to person, place, and time. He has normal reflexes.  Skin: Skin is warm and dry.  Psychiatric: He has a normal mood and affect. He expresses no homicidal and no suicidal ideation. He expresses no suicidal plans and no homicidal plans.  Nursing note and vitals reviewed.   Assessment & Plan:   Problem List Items Addressed This Visit      Cardiovascular and Mediastinum   Atherosclerosis of aorta (HCC) - Primary   Abdominal MRI showed aortic atherosclerosis. Placed order for low dose EC aspirin prior to office visit.   Will check cholesterol levels.   Relevant Orders   CMP14+EGFR (Completed)   Lipid Panel (Completed)   Genitourinary   Renal carcinoma, right [facility])   Referral made for oncology and urology prior to office visit.   Patient has narcotic pain medication already available. If refill is needed instructed to contact office.   Provide LCSW contact information.    Relevant Orders   CMP14+EGFR (Completed)   Lipid Panel (Completed)   C-reactive protein (Completed)      Follow-up: Return in about 8 weeks (around 01/18/2017) for HTN .   Alfonse Spruce FNP

## 2016-11-23 NOTE — Telephone Encounter (Signed)
This was stated on last note:  IF YOU RECEIVE MEDICAID BEFORE YOU RETURN TO SEE ME, CALL AND I WILL REFER YOU FOR OUTPATIENT THERAPY  Patients next appointment is 12-07-16

## 2016-11-23 NOTE — Telephone Encounter (Signed)
Referrals made for OT and PT at [facility]

## 2016-11-23 NOTE — Telephone Encounter (Signed)
Ok? Is there something we need to do?

## 2016-11-23 NOTE — Telephone Encounter (Signed)
Called patient and left message stating referrals were made

## 2016-11-24 LAB — CMP14+EGFR
A/G RATIO: 1.2 (ref 1.2–2.2)
ALT: 12 IU/L (ref 0–44)
AST: 15 IU/L (ref 0–40)
Albumin: 4.2 g/dL (ref 3.5–5.5)
Alkaline Phosphatase: 62 IU/L (ref 39–117)
BUN/Creatinine Ratio: 17 (ref 9–20)
BUN: 18 mg/dL (ref 6–24)
Bilirubin Total: 0.8 mg/dL (ref 0.0–1.2)
CALCIUM: 9.7 mg/dL (ref 8.7–10.2)
CO2: 23 mmol/L (ref 18–29)
CREATININE: 1.08 mg/dL (ref 0.76–1.27)
Chloride: 107 mmol/L — ABNORMAL HIGH (ref 96–106)
GFR, EST AFRICAN AMERICAN: 88 mL/min/{1.73_m2} (ref >59)
GFR, EST NON AFRICAN AMERICAN: 76 mL/min/{1.73_m2} (ref >59)
Globulin, Total: 3.6 g/dL (ref 1.5–4.5)
Glucose: 84 mg/dL (ref 65–99)
Potassium: 5 mmol/L (ref 3.5–5.2)
Sodium: 140 mmol/L (ref 134–144)
TOTAL PROTEIN: 7.8 g/dL (ref 6.0–8.5)

## 2016-11-24 LAB — LIPID PANEL
CHOL/HDL RATIO: 3.1 ratio (ref 0.0–5.0)
Cholesterol, Total: 228 mg/dL — ABNORMAL HIGH (ref 100–199)
HDL: 73 mg/dL (ref >39)
LDL CALC: 113 mg/dL — AB (ref 0–99)
Triglycerides: 208 mg/dL — ABNORMAL HIGH (ref 0–149)
VLDL CHOLESTEROL CAL: 42 mg/dL — AB (ref 5–40)

## 2016-11-24 LAB — C-REACTIVE PROTEIN: CRP: 0.6 mg/L (ref 0.0–4.9)

## 2016-11-25 ENCOUNTER — Other Ambulatory Visit: Payer: Medicaid Other

## 2016-11-30 ENCOUNTER — Ambulatory Visit
Admission: RE | Admit: 2016-11-30 | Discharge: 2016-11-30 | Disposition: A | Discharge status: Home / Self Care | Payer: Medicaid Other | Source: Ambulatory Visit | Attending: Neurosurgery | Admitting: Neurosurgery

## 2016-11-30 DIAGNOSIS — S14121D Central cord syndrome at C1 level of cervical spinal cord, subsequent encounter: Secondary | ICD-10-CM

## 2016-12-02 ENCOUNTER — Other Ambulatory Visit: Payer: Self-pay | Admitting: Family Medicine

## 2016-12-02 DIAGNOSIS — E782 Mixed hyperlipidemia: Secondary | ICD-10-CM

## 2016-12-02 MED ORDER — ATORVASTATIN CALCIUM 10 MG PO TABS: 10.0000 mg | ORAL_TABLET | Freq: Every day | ORAL | 0 refills | Status: DC

## 2016-12-07 ENCOUNTER — Telehealth: Payer: Self-pay

## 2016-12-07 ENCOUNTER — Encounter: Payer: Medicaid Other | Admitting: Physical Medicine & Rehabilitation

## 2016-12-07 NOTE — Telephone Encounter (Signed)
CMA call regarding lab results   Patient did not answer but left a VM stating the reason of the call &  to call me back  

## 2016-12-07 NOTE — Telephone Encounter (Signed)
-----   Message from Alfonse Spruce, [place] sent at 12/02/2016  9:27 AM EDT ----- Liver function normal Kidney function normal Lipid levels were elevated. This can increase your risk of heart disease. You will be prescribed atorvastatin to help lower risk. Recommend follow up in 3 months.  CRP labs that evaluates for conditions that can cause inflammation was negative.

## 2016-12-07 NOTE — Telephone Encounter (Signed)
Patient return CMA call   Patient Verify DOB   Patient was aware and understood  

## 2016-12-07 NOTE — Telephone Encounter (Signed)
-----   Message from Alfonse Spruce, [place] sent at 12/02/2016  9:27 AM EDT ----- Liver function normal Kidney function normal Lipid levels were elevated. This can increase your risk of heart disease. You will be prescribed atorvastatin to help lower risk. Recommend follow up in 3 months.  CRP labs that evaluates for conditions that can cause inflammation was negative.

## 2016-12-09 ENCOUNTER — Telehealth: Payer: Self-pay | Admitting: Family Medicine

## 2016-12-15 ENCOUNTER — Other Ambulatory Visit: Payer: Self-pay | Admitting: Physical Medicine & Rehabilitation

## 2016-12-15 DIAGNOSIS — S14109S Unspecified injury at unspecified level of cervical spinal cord, sequela: Secondary | ICD-10-CM

## 2016-12-15 DIAGNOSIS — G8250 Quadriplegia, unspecified: Secondary | ICD-10-CM

## 2016-12-15 DIAGNOSIS — S14129D Central cord syndrome at unspecified level of cervical spinal cord, subsequent encounter: Secondary | ICD-10-CM

## 2016-12-15 DIAGNOSIS — S14129S Central cord syndrome at unspecified level of cervical spinal cord, sequela: Secondary | ICD-10-CM

## 2016-12-16 ENCOUNTER — Ambulatory Visit: Payer: Medicaid Other | Attending: Family Medicine | Admitting: Family Medicine

## 2016-12-16 ENCOUNTER — Encounter: Payer: Self-pay | Admitting: Family Medicine

## 2016-12-16 ENCOUNTER — Ambulatory Visit: Payer: Medicaid Other | Admitting: Licensed Clinical Social Worker

## 2016-12-16 VITALS — BP 120/81 | HR 100 | Temp 98.4°F | Resp 18 | Ht 72.0 in | Wt 140.8 lb

## 2016-12-16 DIAGNOSIS — Z79899 Other long term (current) drug therapy: Secondary | ICD-10-CM | POA: Insufficient documentation

## 2016-12-16 DIAGNOSIS — Z7982 Long term (current) use of aspirin: Secondary | ICD-10-CM | POA: Diagnosis not present

## 2016-12-16 DIAGNOSIS — C649 Malignant neoplasm of unspecified kidney, except renal pelvis: Secondary | ICD-10-CM | POA: Insufficient documentation

## 2016-12-16 DIAGNOSIS — F17210 Nicotine dependence, cigarettes, uncomplicated: Secondary | ICD-10-CM | POA: Insufficient documentation

## 2016-12-16 DIAGNOSIS — R202 Paresthesia of skin: Secondary | ICD-10-CM | POA: Diagnosis not present

## 2016-12-16 DIAGNOSIS — M79604 Pain in right leg: Secondary | ICD-10-CM | POA: Diagnosis not present

## 2016-12-16 DIAGNOSIS — C641 Malignant neoplasm of right kidney, except renal pelvis: Secondary | ICD-10-CM | POA: Diagnosis not present

## 2016-12-16 DIAGNOSIS — F17200 Nicotine dependence, unspecified, uncomplicated: Secondary | ICD-10-CM | POA: Diagnosis not present

## 2016-12-16 DIAGNOSIS — F4323 Adjustment disorder with mixed anxiety and depressed mood: Secondary | ICD-10-CM | POA: Diagnosis not present

## 2016-12-16 DIAGNOSIS — M79609 Pain in unspecified limb: Secondary | ICD-10-CM

## 2016-12-16 MED ORDER — GABAPENTIN 300 MG PO CAPS: 300.0000 mg | ORAL_CAPSULE | Freq: Three times a day (TID) | ORAL | 2 refills | Status: DC

## 2016-12-16 MED ORDER — NICOTINE 21 MG/24HR TD PT24: 21.0000 mg | MEDICATED_PATCH | Freq: Every day | TRANSDERMAL | 1 refills | Status: DC

## 2016-12-16 NOTE — Progress Notes (Signed)
Patient is here for f/up  

## 2016-12-16 NOTE — BH Specialist Note (Signed)
Integrated Behavioral Health Initial Visit  MRN: [id number] Name: Patrick Romero   Session Start time: 10:25 AM Session End time: 10:55 AM Total time: 30 minutes  Type of Service: [place] Interpretor:No. Interpretor Name and Language: N/A   Warm Hand Off Completed.       SUBJECTIVE: Patrick Romero is a 56 y.o. male accompanied by patient and significant other. Patient was referred by FNP [place] for depression and anxiety. Patient reports the following symptoms/concerns: feelings of sadness and worry, difficulty sleeping, low energy, and irritability Duration of problem: Feb 2018 ; Severity of problem: mild  OBJECTIVE: Mood: Pleasant and Affect: Appropriate Risk of harm to self or others: No plan to harm self or others   LIFE CONTEXT: Family and Social: Pt receives support from significant other, mother, siblings, and adult son School/Work: Pt is unemployed due to medical concerns. He has medicaid and has an upcoming disability hearing scheduled for 01/24/17 Self-Care: Pt reports drinking alcohol ("6 pack lasts few days") socially. He denies any additional substance use and denies need for AA Life Changes: Pt was hospitalized in February 2018 resulting in need for occupational and physical therapy. 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  GOALS ADDRESSED: Patient will reduce symptoms of: anxiety and depression and increase knowledge and/or ability of: coping skills and also: Increase adequate support systems for patient/family and Decrease self-medicating behaviors   INTERVENTIONS: Solution-Focused Strategies, Supportive Counseling, Psychoeducation and/or Health Education and Link to [employer]  Standardized Assessments completed: PHQ 2&9  ASSESSMENT: Patient currently experiencing depression and anxiety triggered by ongoing medical concerns and financial  strain. Pt reports feelings of sadness and worry, difficulty sleeping, low energy, and irritability. Patient may benefit from psychoeducation and psychotherapy. [place] educated pt on the cycle of depression and anxiety and discussed how substance use can negatively impact pt's mental and physical health. LCSWA encouraged pt to implement healthy coping skills to decrease symptoms. Pt successfully identified strategies to utilize routinely to manage depression and anxiety. LCSWA provided family with resources to address food insecurity, psychotherapy, and medication management.   PLAN: 1. Follow up with behavioral health clinician on : Pt was encouraged to contact LCSWA if symptoms worsen or fail to improve to schedule behavioral appointments at [facility]. 2. Behavioral recommendations: LCSWA recommends that pt apply healthy coping skills discussed. Pt is encouraged to schedule follow up appointment with LCSWA 3. Referral(s): Armed forces logistics/support/administrative officer (LME/Outside Clinic) and Community Resources:  Food 4. "From scale of 1-10, how likely are you to follow plan?": 7/10  Rebekah [place], LCSW 12/16/16 2:24 PM

## 2016-12-16 NOTE — Progress Notes (Signed)
Subjective:  Patient ID: Patrick Romero, male    DOB: June 30, 1961  Age: 56 y.o. MRN: [id number]  CC: Establish Care   HPI Patrick Romero presents for follow up newly diagnosed renal carcinoma. Renal ultrasound was followed up with MRI of abdomen. MRI finding of compatible with renal cell carcinoma.Denies any hematuria, dysuria, or flank pain. He does report low bilateral back pain with right sided muscle spasm. Reports having daily spasms. Spasms exacerbated with standing. He reports using narcotics and muscle relaxants with moderate relief of symptoms. Recent follow up with urology 6/618. Recommend right partial nephrectomy. His significant other is present with him during office visit.  He reports support system that includes his family. He does report anxiety and depressed mood related to new diagnosis and financial concerns. He denies any SI/HI. He is agreeable to speaking with LCSW today. Denies medication at this time.  Cervical myelopathy and radiculopathy: He reports regular follow up neuro with neuro rehabilitation physician. 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  He reports paresthesias of the bilateral BUE since history of cervical fracture . Symptoms stable. Denies any bowel or bladder incontinence. BP well controlled. He reports adherence with medications. Denies any hear any CP, SOB, or heart palpitations. .Current everyday smoker 1/2 pack per day. Smoking history of  20 years. He is ready to quit at this time.      Outpatient Medications Prior to Visit  Medication Sig Dispense Refill  . amLODipine (NORVASC) 5 MG tablet Take one tablet by mouth once a day. 30 tablet 4  . atorvastatin (LIPITOR) 10 MG tablet Take 1 tablet (10 mg total) by mouth daily. 90 tablet 0  . baclofen (LIORESAL) 10 MG tablet TAKE 1 TABLET 3 TIMES A DAY 90 tablet 2  . cyclobenzaprine (FLEXERIL) 5 MG tablet TAKE 1 TABLET BY MOUTH 3  TIMES A DAY AS NEEDED FOR MUSCLE SPASM  0  . diclofenac (VOLTAREN) 75 MG EC tablet Take 1 tablet (75 mg total) by mouth 2 (two) times daily with a meal. 60 tablet 3  . Multiple Vitamins-Minerals (MULTIVITAMINS THER. W/MINERALS) TABS tablet Take 1 tablet by mouth daily. 30 each 1  . polyethylene glycol (MIRALAX / GLYCOLAX) packet Take 17 g by mouth 2 (two) times daily. 14 each 0  . gabapentin (NEURONTIN) 300 MG capsule Take 1 capsule (300 mg total) by mouth 2 (two) times daily. 60 capsule 2  . aspirin EC 81 MG tablet Take 1 tablet (81 mg total) by mouth daily. (Patient not taking: Reported on 11/23/2016) 30 tablet 2  . pantoprazole (PROTONIX) 40 MG tablet Take 1 tablet (40 mg total) by mouth daily. (Patient not taking: Reported on 12/16/2016) 30 tablet 1   No facility-administered medications prior to visit.     ROS Review of Systems  Constitutional: Negative.   Eyes: Negative.   Respiratory: Negative.   Cardiovascular: Negative.   Gastrointestinal: Negative.   Genitourinary: Negative.   Musculoskeletal: Positive for back pain (chronic).  Skin: Negative.   Neurological:       Paresthesias   Psychiatric/Behavioral: Negative.    Objective:  BP 120/81 (BP Location: Left Arm, Patient Position: Sitting, Cuff Size: Normal)   Pulse 100   Temp 98.4 F (36.9 C) (Oral)   Resp 18   Ht 6' (1.829 m)   Wt 140 lb 12.8 oz (63.9 kg)   SpO2 100%   BMI 19.10 kg/m  BP/Weight 12/16/2016 11/23/2016 3/66/4403  Systolic BP 474 259 563  Diastolic BP 81 88 79  Wt. (Lbs) 140.8 141.8 -  BMI 19.1 19.23 -     Physical Exam  Constitutional: He appears well-developed and well-nourished.  Eyes: Conjunctivae are normal. Pupils are equal, round, and reactive to light.  Neck: No JVD present.  Cardiovascular: Normal rate, regular rhythm, normal heart sounds and intact distal pulses.   Pulmonary/Chest: Effort normal and breath sounds normal.  Abdominal: Soft. Bowel sounds are normal.  Musculoskeletal:        Lumbar back: He exhibits tenderness and pain (with palpation).  Neurological: He exhibits abnormal muscle tone.  Reflex Scores:      Tricep reflexes are 2+ on the right side and 2+ on the left side.      Bicep reflexes are 2+ on the right side and 2+ on the left side.      Brachioradialis reflexes are 2+ on the right side and 2+ on the left side.      Patellar reflexes are 3+ on the right side and 3+ on the left side. RUE weakness 4/5 muscle strength. Decreased hand grip.  Skin: Skin is warm and dry.  Psychiatric: He exhibits a depressed mood. He expresses no homicidal and no suicidal ideation. He expresses no suicidal plans and no homicidal plans.  Nursing note and vitals reviewed.   Assessment & Plan:   Problem List Items Addressed This Visit      Genitourinary   Renal carcinoma, right ([place]) - Primary    Other Visit Diagnoses    Paresthesia and pain of left extremity       Increase dosage of gabapentin.   Relevant Medications   gabapentin (NEURONTIN) 300 MG capsule   Paresthesia and pain of right extremity       Increase dosage of gabapentin   Relevant Medications   gabapentin (NEURONTIN) 300 MG capsule   Adjustment disorder with mixed anxiety and depressed mood       LCSW spoke with patient and provided resources   Current every day smoker       Relevant Medications   nicotine (NICODERM CQ - DOSED IN MG/24 HOURS) 21 mg/24hr patch      Meds ordered this encounter  Medications  . gabapentin (NEURONTIN) 300 MG capsule    Sig: Take 1 capsule (300 mg total) by mouth 3 (three) times daily.    Dispense:  90 capsule    Refill:  2    Order Specific Question:   Supervising Provider    Answer:   Tresa Garter [id number]  . nicotine (NICODERM CQ - DOSED IN MG/24 HOURS) 21 mg/24hr patch    Sig: Place 1 patch (21 mg total) onto the skin daily.    Dispense:  28 patch    Refill:  1    Order Specific Question:   Supervising Provider    Answer:   Tresa Garter  [id number]    Follow-up: Return in about 2 months (around 02/15/2017) for History of Renal CA/ HTN / HLD.   Alfonse Spruce FNP

## 2016-12-16 NOTE — Patient Instructions (Addendum)
Paresthesia Paresthesia is a burning or prickling feeling. This feeling can happen in any part of the body. It often happens in the hands, arms, legs, or feet. Usually, it is not painful. In most cases, the feeling goes away in a short time and is not a sign of a serious problem. Follow these instructions at home:  Avoid drinking alcohol.  Try massage or needle therapy (acupuncture) to help with your problems.  Keep all follow-up visits as told by your doctor. This is important. Contact a doctor if:  You keep on having episodes of paresthesia.  Your burning or prickling feeling gets worse when you walk.  You have pain or cramps.  You feel dizzy.  You have a rash. Get help right away if:  You feel weak.  You have trouble walking or moving.  You have problems speaking, understanding, or seeing.  You feel confused.  You cannot control when you pee (urinate) or poop (bowel movement).  You lose feeling (numbness) after an injury.  You pass out (faint). This information is not intended to replace advice given to you by your health care provider. Make sure you discuss any questions you have with your health care provider. Document Released: 06/09/2008 Document Revised: 12/03/2015 Document Reviewed: 06/23/2014 Elsevier Interactive Patient Education  2018 [employer].  Nicotine skin patches What is this medicine? NICOTINE ([place]) helps people stop smoking. The patches replace the nicotine found in cigarettes and help to decrease withdrawal effects. They are most effective when used in combination with a stop-smoking program. This medicine may be used for other purposes; ask your health care provider or pharmacist if you have questions. COMMON BRAND NAME(S): Habitrol, Nicoderm CQ, Nicotrol What should I tell my health care provider before I take this medicine? They need to know if you have any of these conditions: -diabetes -heart disease, angina, irregular heartbeat or  previous heart attack -high blood pressure -lung disease, including asthma -overactive thyroid -pheochromocytoma -seizures or a history of seizures -skin problems, like eczema -stomach problems or ulcers -an unusual or allergic reaction to nicotine, adhesives, other medicines, foods, dyes, or preservatives -pregnant or trying to get pregnant -breast-feeding How should I use this medicine? This medicine is for use on the skin. Follow the directions that come with the patches. Find an area of skin on your upper arm, chest, or back that is clean, dry, greaseless, undamaged and hairless. Wash hands with plain soap and water. Do not use anything that contains aloe, lanolin or glycerin as these may prevent the patch from sticking. Dry thoroughly. Remove the patch from the sealed pouch. Do not try to cut or trim the patch. Using your palm, press the patch firmly in place for 10 seconds to make sure that there is good contact with your skin. After applying the patch, wash your hands. Change the patch every day, keeping to a regular schedule. When you apply a new patch, use a new area of skin. Wait at least 1 week before using the same area again. Talk to your pediatrician regarding the use of this medicine in children. Special care may be needed. Overdosage: If you think you have taken too much of this medicine contact a poison control center or emergency room at once. NOTE: This medicine is only for you. Do not share this medicine with others. What if I miss a dose? If you forget to replace a patch, use it as soon as you can. Only use one patch at a time  and do not leave on the skin for longer than directed. If a patch falls off, you can replace it, but keep to your schedule and remove the patch at the right time. What may interact with this medicine? -medicines for asthma -medicines for blood pressure -medicines for mental depression This list may not describe all possible interactions. Give your  health care provider a list of all the medicines, herbs, non-prescription drugs, or dietary supplements you use. Also tell them if you smoke, drink alcohol, or use illegal drugs. Some items may interact with your medicine. What should I watch for while using this medicine? You should begin using the nicotine patch the day you stop smoking. It is okay if you do not succeed at your attempt to quit and have a cigarette. You can still continue your quit attempt and keep using the product as directed. Just throw away your cigarettes and get back to your quit plan. You can keep the patch in place during swimming, bathing, and showering. If your patch falls off during these activities, replace it. When you first apply the patch, your skin may itch or burn. This should go away soon. When you remove a patch, the skin may look red, but this should only last for a few days. Call your doctor or health care professional if skin redness does not go away after 4 days, if your skin swells, or if you get a rash. If you are a diabetic and you quit smoking, the effects of insulin may be increased and you may need to reduce your insulin dose. Check with your doctor or health care professional about how you should adjust your insulin dose. If you are going to have a magnetic resonance imaging (MRI) procedure, tell your MRI technician if you have this patch on your body. It must be removed before a MRI. What side effects may I notice from receiving this medicine? Side effects that you should report to your doctor or health care professional as soon as possible: -allergic reactions like skin rash, itching or hives, swelling of the face, lips, or tongue -breathing problems -changes in hearing -changes in vision -chest pain -cold sweats -confusion -fast, irregular heartbeat -feeling faint or lightheaded, falls -headache -increased saliva -skin redness that lasts more than 4 days -stomach pain -signs and symptoms of  nicotine overdose like nausea; vomiting; dizziness; weakness; and rapid heartbeat Side effects that usually do not require medical attention (report to your doctor or health care professional if they continue or are bothersome): -diarrhea -dry mouth -hiccups -irritability -nervousness or restlessness -trouble sleeping or vivid dreams This list may not describe all possible side effects. Call your doctor for medical advice about side effects. You may report side effects to FDA at 1-800-FDA-1088. Where should I keep my medicine? Keep out of the reach of children. Store at room temperature between 20 and 25 degrees C (68 and 77 degrees F). Protect from heat and light. Store in International aid/development worker until ready to use. Throw away unused medicine after the expiration date. When you remove a patch, fold with sticky sides together; put in an empty opened pouch and throw away. NOTE: This sheet is a summary. It may not cover all possible information. If you have questions about this medicine, talk to your doctor, pharmacist, or health care provider.  2018 Elsevier/Gold Standard (2014-05-26 15:46:21)

## 2016-12-19 ENCOUNTER — Other Ambulatory Visit: Payer: Self-pay | Admitting: Urology

## 2016-12-23 NOTE — Telephone Encounter (Signed)
Pt. Called requesting to speak with his nurse regarding his body pain. Pt. States that he has trouble using his right side. Please f/u with pt.

## 2016-12-26 ENCOUNTER — Telehealth: Payer: Self-pay | Admitting: Physical Medicine & Rehabilitation

## 2016-12-26 NOTE — Telephone Encounter (Signed)
Received voicemail to return call left message on machine

## 2016-12-26 NOTE — Telephone Encounter (Signed)
  Pt. Called  regarding his body pain. Pt. States that he has trouble using his right side. Please advice?

## 2016-12-29 NOTE — Progress Notes (Signed)
08-31-16 (EPIC) EKG, CXR

## 2016-12-29 NOTE — Patient Instructions (Signed)
Patrick Romero  12/29/2016   Your procedure is scheduled on: 01-04-17   Report to [facility] Main Entrance Take [place]  elevators to 3rd floor to [place] at 10:15 AM.   Call this number if you have problems the morning of surgery [phone]    Remember: ONLY 1 PERSON MAY GO WITH YOU TO SHORT STAY TO GET  READY MORNING OF Patrick Romero.  Do not eat food or drink liquids :After Midnight.     CLEAR LIQUID DIET THE DAY BEFORE SURGERY   Foods Allowed                                                                     Foods Excluded  Coffee and tea, regular and decaf                             liquids that you cannot  Plain Jell-O in any flavor                                             see through such as: Fruit ices (not with fruit pulp)                                     milk, soups, orange juice  Iced Popsicles                                    All solid food Carbonated beverages, regular and diet                                    Cranberry, grape and apple juices Sports drinks like Gatorade Lightly seasoned clear broth or consume(fat free) Sugar, honey syrup  Sample Menu Breakfast                                Lunch                                     Supper Cranberry juice                    Beef broth                            Chicken broth Jell-O                                     Grape juice  Apple juice Coffee or tea                        Jell-O                                      Popsicle                                                Coffee or tea                        Coffee or tea  _____________________________________________________________________     Take these medicines the morning of surgery with A SIP OF WATER: Amlodipine (Norvasc), Atorvastatin (Lipitor) and Gabapentin (Neurontin)                                               Men may shave face and Romero.   Do not bring valuables to the  hospital. [place].  Contacts, dentures or bridgework may not be worn into surgery.  Leave suitcase in the car. After surgery it may be brought to your room.     Please read over the following fact sheets you were given: _____________________________________________________________________             [facility] - Preparing for Surgery Before surgery, you can play an important role.  Because skin is not sterile, your skin needs to be as free of germs as possible.  You can reduce the number of germs on your skin by washing with CHG (chlorahexidine gluconate) soap before surgery.  CHG is an antiseptic cleaner which kills germs and bonds with the skin to continue killing germs even after washing. Please DO NOT use if you have an allergy to CHG or antibacterial soaps.  If your skin becomes reddened/irritated stop using the CHG and inform your nurse when you arrive at Short Stay. Do not shave (including legs and underarms) for at least 48 hours prior to the first CHG shower.  You may shave your face/Romero. Please follow these instructions carefully:  1.  Shower with CHG Soap the night before surgery and the  morning of Surgery.  2.  If you choose to wash your hair, wash your hair first as usual with your  normal  shampoo.  3.  After you shampoo, rinse your hair and body thoroughly to remove the  shampoo.                           4.  Use CHG as you would any other liquid soap.  You can apply chg directly  to the skin and wash                       Gently with a scrungie or clean washcloth.  5.  Apply the CHG Soap to your body ONLY FROM THE Romero DOWN.   Do not use on face/ open  Wound or open sores. Avoid contact with eyes, ears mouth and genitals (private parts).                       Wash face,  Genitals (private parts) with your normal soap.             6.  Wash thoroughly, paying special attention to the area where your  surgery  will be performed.  7.  Thoroughly rinse your body with warm water from the Romero down.  8.  DO NOT shower/wash with your normal soap after using and rinsing off  the CHG Soap.                9.  Pat yourself dry with a clean towel.            10.  Wear clean pajamas.            11.  Place clean sheets on your bed the night of your first shower and do not  sleep with pets. Day of Surgery : Do not apply any lotions/deodorants the morning of surgery.  Please wear clean clothes to the hospital/surgery center.  FAILURE TO FOLLOW THESE INSTRUCTIONS MAY RESULT IN THE CANCELLATION OF YOUR SURGERY PATIENT SIGNATURE_________________________________  NURSE SIGNATURE__________________________________  ________________________________________________________________________

## 2016-12-30 ENCOUNTER — Encounter (HOSPITAL_COMMUNITY): Payer: Self-pay

## 2016-12-30 ENCOUNTER — Encounter (HOSPITAL_COMMUNITY)
Admission: RE | Admit: 2016-12-30 | Discharge: 2016-12-30 | Disposition: A | Discharge status: Home / Self Care | Payer: Medicaid Other | Source: Ambulatory Visit | Attending: Urology | Admitting: Urology

## 2016-12-30 DIAGNOSIS — N2889 Other specified disorders of kidney and ureter: Secondary | ICD-10-CM | POA: Insufficient documentation

## 2016-12-30 DIAGNOSIS — Z0183 Encounter for blood typing: Secondary | ICD-10-CM | POA: Diagnosis not present

## 2016-12-30 DIAGNOSIS — Z01812 Encounter for preprocedural laboratory examination: Secondary | ICD-10-CM | POA: Diagnosis not present

## 2016-12-30 HISTORY — DX: Major depressive disorder, single episode, unspecified: F32.9

## 2016-12-30 HISTORY — DX: Sickle-cell trait: D57.3

## 2016-12-30 HISTORY — DX: Depression, unspecified: F32.A

## 2016-12-30 HISTORY — DX: Essential (primary) hypertension: I10

## 2016-12-30 HISTORY — DX: Unspecified osteoarthritis, unspecified site: M19.90

## 2016-12-30 HISTORY — DX: Cerebral infarction, unspecified: I63.9

## 2016-12-30 LAB — CBC
HEMATOCRIT: 32.9 % — AB (ref 39.0–52.0)
HEMOGLOBIN: 10.5 g/dL — AB (ref 13.0–17.0)
MCH: 25.1 pg — AB (ref 26.0–34.0)
MCHC: 31.9 g/dL (ref 30.0–36.0)
MCV: 78.5 fL (ref 78.0–100.0)
Platelets: 323 10*3/uL (ref 150–400)
RBC: 4.19 MIL/uL — AB (ref 4.22–5.81)
RDW: 14.2 % (ref 11.5–15.5)
WBC: 7.1 10*3/uL (ref 4.0–10.5)

## 2016-12-30 LAB — ABO/RH: ABO/RH(D): A POS

## 2016-12-30 LAB — BASIC METABOLIC PANEL
ANION GAP: 7 (ref 5–15)
BUN: 13 mg/dL (ref 6–20)
CHLORIDE: 109 mmol/L (ref 101–111)
CO2: 24 mmol/L (ref 22–32)
Calcium: 9 mg/dL (ref 8.9–10.3)
Creatinine, Ser: 0.99 mg/dL (ref 0.61–1.24)
GFR calc Af Amer: >60 mL/min (ref >60)
GLUCOSE: 113 mg/dL — AB (ref 65–99)
Potassium: 3.6 mmol/L (ref 3.5–5.1)
Sodium: 140 mmol/L (ref 135–145)

## 2016-12-30 NOTE — Progress Notes (Signed)
CBC done 12/30/16 faxed via epic to Dr Tresa Moore.

## 2016-12-30 NOTE — Progress Notes (Signed)
.......  Dec 03, 1960  patient's date of birth is  Your patient has screened at an elevated risk for obstructive sleep apnea using the STOP-Bang tool during a presurgical visit. A score of 5 or greater is an elevated risk.

## 2017-01-04 ENCOUNTER — Observation Stay (HOSPITAL_COMMUNITY)
Admission: RE | Admit: 2017-01-04 | Discharge: 2017-01-05 | Disposition: A | Discharge status: Home / Self Care | Payer: Medicaid Other | Source: Ambulatory Visit | Attending: Urology | Admitting: Urology

## 2017-01-04 ENCOUNTER — Encounter (HOSPITAL_COMMUNITY)
Admission: RE | Disposition: A | Discharge status: Home / Self Care | Payer: Self-pay | Source: Ambulatory Visit | Attending: Urology

## 2017-01-04 ENCOUNTER — Other Ambulatory Visit: Payer: Self-pay | Admitting: Physical Medicine & Rehabilitation

## 2017-01-04 ENCOUNTER — Inpatient Hospital Stay (HOSPITAL_COMMUNITY): Payer: Medicaid Other | Admitting: Registered Nurse

## 2017-01-04 ENCOUNTER — Encounter (HOSPITAL_COMMUNITY): Payer: Self-pay | Admitting: Registered Nurse

## 2017-01-04 DIAGNOSIS — Z8249 Family history of ischemic heart disease and other diseases of the circulatory system: Secondary | ICD-10-CM | POA: Insufficient documentation

## 2017-01-04 DIAGNOSIS — Z79899 Other long term (current) drug therapy: Secondary | ICD-10-CM | POA: Insufficient documentation

## 2017-01-04 DIAGNOSIS — Z836 Family history of other diseases of the respiratory system: Secondary | ICD-10-CM | POA: Diagnosis not present

## 2017-01-04 DIAGNOSIS — I10 Essential (primary) hypertension: Secondary | ICD-10-CM | POA: Insufficient documentation

## 2017-01-04 DIAGNOSIS — Z791 Long term (current) use of non-steroidal anti-inflammatories (NSAID): Secondary | ICD-10-CM | POA: Insufficient documentation

## 2017-01-04 DIAGNOSIS — M5020 Other cervical disc displacement, unspecified cervical region: Secondary | ICD-10-CM | POA: Insufficient documentation

## 2017-01-04 DIAGNOSIS — Z801 Family history of malignant neoplasm of trachea, bronchus and lung: Secondary | ICD-10-CM | POA: Diagnosis not present

## 2017-01-04 DIAGNOSIS — C641 Malignant neoplasm of right kidney, except renal pelvis: Secondary | ICD-10-CM | POA: Diagnosis not present

## 2017-01-04 DIAGNOSIS — N2889 Other specified disorders of kidney and ureter: Secondary | ICD-10-CM | POA: Diagnosis present

## 2017-01-04 DIAGNOSIS — Z23 Encounter for immunization: Secondary | ICD-10-CM | POA: Insufficient documentation

## 2017-01-04 DIAGNOSIS — D573 Sickle-cell trait: Secondary | ICD-10-CM | POA: Insufficient documentation

## 2017-01-04 DIAGNOSIS — Z8673 Personal history of transient ischemic attack (TIA), and cerebral infarction without residual deficits: Secondary | ICD-10-CM | POA: Diagnosis not present

## 2017-01-04 DIAGNOSIS — F17210 Nicotine dependence, cigarettes, uncomplicated: Secondary | ICD-10-CM | POA: Diagnosis not present

## 2017-01-04 HISTORY — PX: ROBOTIC ASSITED PARTIAL NEPHRECTOMY: SHX6087

## 2017-01-04 LAB — TYPE AND SCREEN
ABO/RH(D): A POS
ABO/RH(D): A POS
Antibody Screen: NEGATIVE
Antibody Screen: NEGATIVE

## 2017-01-04 LAB — HEMOGLOBIN AND HEMATOCRIT, BLOOD
HEMATOCRIT: 30.2 % — AB (ref 39.0–52.0)
Hemoglobin: 9.8 g/dL — ABNORMAL LOW (ref 13.0–17.0)

## 2017-01-04 SURGERY — NEPHRECTOMY, PARTIAL, ROBOT-ASSISTED
Anesthesia: General | Site: Abdomen | Laterality: Right

## 2017-01-04 MED ORDER — PANTOPRAZOLE SODIUM 40 MG PO TBEC
40.0000 mg | DELAYED_RELEASE_TABLET | Freq: Every day | ORAL | Status: DC
  Administered 2017-01-04 – 2017-01-05 (×2): 40 mg via ORAL
  Filled 2017-01-04 (×2): qty 1

## 2017-01-04 MED ORDER — PROPOFOL 10 MG/ML IV BOLUS
INTRAVENOUS | Status: AC
  Filled 2017-01-04: qty 40

## 2017-01-04 MED ORDER — ONDANSETRON HCL 4 MG/2ML IJ SOLN: 4.0000 mg | INTRAMUSCULAR | Status: DC | PRN

## 2017-01-04 MED ORDER — PHENYLEPHRINE 40 MCG/ML (10ML) SYRINGE FOR IV PUSH (FOR BLOOD PRESSURE SUPPORT)
PREFILLED_SYRINGE | INTRAVENOUS | Status: AC
  Filled 2017-01-04: qty 10

## 2017-01-04 MED ORDER — MEPERIDINE HCL 50 MG/ML IJ SOLN: 6.2500 mg | INTRAMUSCULAR | Status: DC | PRN

## 2017-01-04 MED ORDER — LIDOCAINE HCL (CARDIAC) 20 MG/ML IV SOLN
INTRAVENOUS | Status: DC | PRN
  Administered 2017-01-04: 25 mg via INTRATRACHEAL
  Administered 2017-01-04: 75 mg via INTRAVENOUS

## 2017-01-04 MED ORDER — HYDROMORPHONE HCL 2 MG/ML IJ SOLN
INTRAMUSCULAR | Status: AC
  Filled 2017-01-04: qty 1

## 2017-01-04 MED ORDER — DIPHENHYDRAMINE HCL 50 MG/ML IJ SOLN: 12.5000 mg | Freq: Four times a day (QID) | INTRAMUSCULAR | Status: DC | PRN

## 2017-01-04 MED ORDER — SODIUM CHLORIDE 0.9 % IJ SOLN
INTRAMUSCULAR | Status: AC
  Filled 2017-01-04: qty 10

## 2017-01-04 MED ORDER — HYDROMORPHONE HCL 1 MG/ML IJ SOLN: 0.2500 mg | INTRAMUSCULAR | Status: DC | PRN

## 2017-01-04 MED ORDER — DEXAMETHASONE SODIUM PHOSPHATE 10 MG/ML IJ SOLN
INTRAMUSCULAR | Status: AC
  Filled 2017-01-04: qty 1

## 2017-01-04 MED ORDER — PNEUMOCOCCAL VAC POLYVALENT 25 MCG/0.5ML IJ INJ
0.5000 mL | INJECTION | INTRAMUSCULAR | Status: AC
  Administered 2017-01-05: 0.5 mL via INTRAMUSCULAR
  Filled 2017-01-04: qty 0.5

## 2017-01-04 MED ORDER — ACETAMINOPHEN 500 MG PO TABS
1000.0000 mg | ORAL_TABLET | Freq: Four times a day (QID) | ORAL | Status: AC
  Administered 2017-01-04 – 2017-01-05 (×4): 1000 mg via ORAL
  Filled 2017-01-04 (×3): qty 2

## 2017-01-04 MED ORDER — HYDROMORPHONE HCL 1 MG/ML IJ SOLN
INTRAMUSCULAR | Status: DC | PRN
  Administered 2017-01-04: 0.5 mg via INTRAVENOUS
  Administered 2017-01-04: 1 mg via INTRAVENOUS
  Administered 2017-01-04: 0.5 mg via INTRAVENOUS

## 2017-01-04 MED ORDER — MIDAZOLAM HCL 5 MG/5ML IJ SOLN
INTRAMUSCULAR | Status: DC | PRN
  Administered 2017-01-04: 2 mg via INTRAVENOUS

## 2017-01-04 MED ORDER — LACTATED RINGERS IV SOLN: INTRAVENOUS | Status: DC

## 2017-01-04 MED ORDER — DIPHENHYDRAMINE HCL 12.5 MG/5ML PO ELIX
12.5000 mg | ORAL_SOLUTION | Freq: Four times a day (QID) | ORAL | Status: DC | PRN
Start: 2017-01-04 — End: 2017-01-05

## 2017-01-04 MED ORDER — HYDROMORPHONE HCL 1 MG/ML IJ SOLN
0.5000 mg | INTRAMUSCULAR | Status: DC | PRN
  Administered 2017-01-04: 1 mg via INTRAVENOUS
  Filled 2017-01-04: qty 1

## 2017-01-04 MED ORDER — SODIUM CHLORIDE 0.9 % IJ SOLN
INTRAMUSCULAR | Status: DC | PRN
  Administered 2017-01-04: 20 mL

## 2017-01-04 MED ORDER — STERILE WATER FOR IRRIGATION IR SOLN
Status: DC | PRN
  Administered 2017-01-04: 1000 mL

## 2017-01-04 MED ORDER — HYDROMORPHONE HCL 1 MG/ML IJ SOLN: 0.5000 mg | INTRAMUSCULAR | Status: DC | PRN

## 2017-01-04 MED ORDER — LABETALOL HCL 5 MG/ML IV SOLN
INTRAVENOUS | Status: AC
  Filled 2017-01-04: qty 4

## 2017-01-04 MED ORDER — MAGNESIUM CITRATE PO SOLN
1.0000 | Freq: Once | ORAL | Status: DC
  Filled 2017-01-04: qty 296

## 2017-01-04 MED ORDER — ROCURONIUM BROMIDE 100 MG/10ML IV SOLN
INTRAVENOUS | Status: DC | PRN
  Administered 2017-01-04: 45 mg via INTRAVENOUS
  Administered 2017-01-04: 5 mg via INTRAVENOUS

## 2017-01-04 MED ORDER — LABETALOL HCL 5 MG/ML IV SOLN
INTRAVENOUS | Status: DC | PRN
  Administered 2017-01-04 (×2): 2.5 mg via INTRAVENOUS

## 2017-01-04 MED ORDER — SODIUM CHLORIDE 0.9 % IJ SOLN
INTRAMUSCULAR | Status: AC
  Filled 2017-01-04: qty 50

## 2017-01-04 MED ORDER — SUGAMMADEX SODIUM 200 MG/2ML IV SOLN
INTRAVENOUS | Status: AC
  Filled 2017-01-04: qty 2

## 2017-01-04 MED ORDER — DEXTROSE-NACL 5-0.45 % IV SOLN
INTRAVENOUS | Status: DC
  Administered 2017-01-04 – 2017-01-05 (×2): via INTRAVENOUS

## 2017-01-04 MED ORDER — PROPOFOL 10 MG/ML IV BOLUS
INTRAVENOUS | Status: DC | PRN
  Administered 2017-01-04: 40 mg via INTRAVENOUS
  Administered 2017-01-04: 175 mg via INTRAVENOUS
  Administered 2017-01-04: 50 mg via INTRAVENOUS

## 2017-01-04 MED ORDER — LACTATED RINGERS IR SOLN
Status: DC | PRN
  Administered 2017-01-04: 1000 mL

## 2017-01-04 MED ORDER — LACTATED RINGERS IV SOLN
INTRAVENOUS | Status: DC
  Administered 2017-01-04 (×2): via INTRAVENOUS

## 2017-01-04 MED ORDER — OXYCODONE HCL 5 MG PO TABS
5.0000 mg | ORAL_TABLET | ORAL | Status: DC | PRN
  Administered 2017-01-04 – 2017-01-05 (×3): 5 mg via ORAL
  Filled 2017-01-04 (×3): qty 1

## 2017-01-04 MED ORDER — DEXAMETHASONE SODIUM PHOSPHATE 10 MG/ML IJ SOLN
INTRAMUSCULAR | Status: DC | PRN
  Administered 2017-01-04: 10 mg via INTRAVENOUS

## 2017-01-04 MED ORDER — ONDANSETRON HCL 4 MG/2ML IJ SOLN
INTRAMUSCULAR | Status: DC | PRN
  Administered 2017-01-04: 4 mg via INTRAVENOUS

## 2017-01-04 MED ORDER — PROMETHAZINE HCL 25 MG/ML IJ SOLN: 6.2500 mg | INTRAMUSCULAR | Status: DC | PRN

## 2017-01-04 MED ORDER — CEFAZOLIN SODIUM-DEXTROSE 2-4 GM/100ML-% IV SOLN
2.0000 g | INTRAVENOUS | Status: AC
  Administered 2017-01-04: 2 g via INTRAVENOUS
  Filled 2017-01-04: qty 100

## 2017-01-04 MED ORDER — MIDAZOLAM HCL 2 MG/2ML IJ SOLN
INTRAMUSCULAR | Status: AC
  Filled 2017-01-04: qty 2

## 2017-01-04 MED ORDER — GABAPENTIN 300 MG PO CAPS
300.0000 mg | ORAL_CAPSULE | Freq: Three times a day (TID) | ORAL | Status: DC
  Administered 2017-01-04 – 2017-01-05 (×2): 300 mg via ORAL
  Filled 2017-01-04 (×2): qty 1

## 2017-01-04 MED ORDER — ONDANSETRON HCL 4 MG/2ML IJ SOLN
INTRAMUSCULAR | Status: AC
  Filled 2017-01-04: qty 2

## 2017-01-04 MED ORDER — SUGAMMADEX SODIUM 200 MG/2ML IV SOLN
INTRAVENOUS | Status: DC | PRN
  Administered 2017-01-04: 200 mg via INTRAVENOUS

## 2017-01-04 MED ORDER — BUPIVACAINE LIPOSOME 1.3 % IJ SUSP
20.0000 mL | Freq: Once | INTRAMUSCULAR | Status: AC
  Administered 2017-01-04: 20 mL
  Filled 2017-01-04: qty 20

## 2017-01-04 MED ORDER — SUCCINYLCHOLINE CHLORIDE 20 MG/ML IJ SOLN
INTRAMUSCULAR | Status: DC | PRN
  Administered 2017-01-04: 120 mg via INTRAVENOUS

## 2017-01-04 MED ORDER — DEXMEDETOMIDINE HCL IN NACL 200 MCG/50ML IV SOLN
INTRAVENOUS | Status: AC
  Filled 2017-01-04: qty 50

## 2017-01-04 MED ORDER — AMLODIPINE BESYLATE 5 MG PO TABS
5.0000 mg | ORAL_TABLET | Freq: Every day | ORAL | Status: DC
  Administered 2017-01-05: 5 mg via ORAL
  Filled 2017-01-04: qty 1

## 2017-01-04 MED ORDER — PHENYLEPHRINE HCL 10 MG/ML IJ SOLN
INTRAMUSCULAR | Status: DC | PRN
  Administered 2017-01-04: 80 ug via INTRAVENOUS

## 2017-01-04 MED ORDER — BACLOFEN 10 MG PO TABS
10.0000 mg | ORAL_TABLET | Freq: Three times a day (TID) | ORAL | Status: DC
  Administered 2017-01-04 – 2017-01-05 (×2): 10 mg via ORAL
  Filled 2017-01-04 (×2): qty 1

## 2017-01-04 MED ORDER — NICOTINE 21 MG/24HR TD PT24
21.0000 mg | MEDICATED_PATCH | Freq: Every day | TRANSDERMAL | Status: DC
  Administered 2017-01-04 – 2017-01-05 (×2): 21 mg via TRANSDERMAL
  Filled 2017-01-04 (×2): qty 1

## 2017-01-04 MED ORDER — SUFENTANIL CITRATE 50 MCG/ML IV SOLN
INTRAVENOUS | Status: AC
  Filled 2017-01-04: qty 1

## 2017-01-04 MED ORDER — ATORVASTATIN CALCIUM 10 MG PO TABS
10.0000 mg | ORAL_TABLET | Freq: Every day | ORAL | Status: DC
  Administered 2017-01-04: 10 mg via ORAL
  Filled 2017-01-04: qty 1

## 2017-01-04 MED ORDER — SUFENTANIL CITRATE 50 MCG/ML IV SOLN
INTRAVENOUS | Status: DC | PRN
  Administered 2017-01-04 (×2): 5 ug via INTRAVENOUS
  Administered 2017-01-04 (×3): 10 ug via INTRAVENOUS

## 2017-01-04 MED ORDER — HYDROCODONE-ACETAMINOPHEN 5-325 MG PO TABS
1.0000 | ORAL_TABLET | Freq: Four times a day (QID) | ORAL | 0 refills | Status: DC | PRN
Start: 2017-01-04 — End: 2017-05-03

## 2017-01-04 SURGICAL SUPPLY — 75 items
ADH SKN CLS APL DERMABOND .7 (GAUZE/BANDAGES/DRESSINGS) ×1
AGENT HMST KT MTR STRL THRMB (HEMOSTASIS) ×1
APL ESCP 34 STRL LF DISP (HEMOSTASIS) ×1
APPLICATOR SURGIFLO ENDO (HEMOSTASIS) ×3 IMPLANT
BAG SPEC RTRVL LRG 6X4 10 (ENDOMECHANICALS) ×1
CHLORAPREP W/TINT 26ML (MISCELLANEOUS) ×3 IMPLANT
CLIP LIGATING HEM O LOK PURPLE (MISCELLANEOUS) ×3 IMPLANT
CLIP LIGATING HEMO LOK XL GOLD (MISCELLANEOUS) ×2 IMPLANT
CLIP LIGATING HEMO O LOK GREEN (MISCELLANEOUS) ×8 IMPLANT
CLIP SUT LAPRA TY ABSORB (SUTURE) ×3 IMPLANT
COVER SURGICAL LIGHT HANDLE (MISCELLANEOUS) ×3 IMPLANT
COVER TIP SHEARS 8 DVNC (MISCELLANEOUS) ×1 IMPLANT
COVER TIP SHEARS 8MM DA VINCI (MISCELLANEOUS) ×2
DECANTER SPIKE VIAL GLASS SM (MISCELLANEOUS) ×3 IMPLANT
DERMABOND ADVANCED (GAUZE/BANDAGES/DRESSINGS) ×2
DERMABOND ADVANCED .7 DNX12 (GAUZE/BANDAGES/DRESSINGS) ×1 IMPLANT
DRAIN CHANNEL 15F RND FF 3/16 (WOUND CARE) ×3 IMPLANT
DRAPE ARM DVNC X/XI (DISPOSABLE) ×4 IMPLANT
DRAPE COLUMN DVNC XI (DISPOSABLE) ×1 IMPLANT
DRAPE DA VINCI XI ARM (DISPOSABLE) ×8
DRAPE DA VINCI XI COLUMN (DISPOSABLE) ×2
DRAPE INCISE IOBAN 66X45 STRL (DRAPES) ×3 IMPLANT
DRAPE LAPAROSCOPIC ABDOMINAL (DRAPES) ×3 IMPLANT
DRAPE SHEET LG 3/4 BI-LAMINATE (DRAPES) ×3 IMPLANT
DRSG TEGADERM 2-3/8X2-3/4 SM (GAUZE/BANDAGES/DRESSINGS) ×2 IMPLANT
ELECT PENCIL ROCKER SW 15FT (MISCELLANEOUS) ×3 IMPLANT
ELECT REM PT RETURN 15FT ADLT (MISCELLANEOUS) ×3 IMPLANT
EVACUATOR SILICONE 100CC (DRAIN) ×3 IMPLANT
GAUZE SPONGE 4X4 12PLY STRL (GAUZE/BANDAGES/DRESSINGS) ×2 IMPLANT
GLOVE BIO SURGEON STRL SZ 6.5 (GLOVE) ×2 IMPLANT
GLOVE BIO SURGEONS STRL SZ 6.5 (GLOVE) ×1
GLOVE BIOGEL M STRL SZ7.5 (GLOVE) ×6 IMPLANT
GOWN STRL REUS W/TWL LRG LVL3 (GOWN DISPOSABLE) ×6 IMPLANT
HEMOSTAT SURGICEL 4X8 (HEMOSTASIS) ×3 IMPLANT
IRRIG SUCT STRYKERFLOW 2 WTIP (MISCELLANEOUS) ×3
IRRIGATION SUCT STRKRFLW 2 WTP (MISCELLANEOUS) IMPLANT
KIT BASIN OR (CUSTOM PROCEDURE TRAY) ×3 IMPLANT
LOOP VESSEL MAXI BLUE (MISCELLANEOUS) ×3 IMPLANT
MARKER SKIN DUAL TIP RULER LAB (MISCELLANEOUS) ×3 IMPLANT
NDL INSUFFLATION 14GA 120MM (NEEDLE) ×1 IMPLANT
NEEDLE INSUFFLATION 14GA 120MM (NEEDLE) ×3 IMPLANT
NS IRRIG 1000ML POUR BTL (IV SOLUTION) ×3 IMPLANT
PORT ACCESS TROCAR AIRSEAL 12 (TROCAR) ×1 IMPLANT
PORT ACCESS TROCAR AIRSEAL 5M (TROCAR) ×2
POSITIONER SURGICAL ARM (MISCELLANEOUS) ×8 IMPLANT
POUCH SPECIMEN RETRIEVAL 10MM (ENDOMECHANICALS) ×3 IMPLANT
RELOAD STAPLE 60 2.6 WHT THN (STAPLE) IMPLANT
RELOAD STAPLER WHITE 60MM (STAPLE) IMPLANT
SEAL CANN UNIV 5-8 DVNC XI (MISCELLANEOUS) ×4 IMPLANT
SEAL XI 5MM-8MM UNIVERSAL (MISCELLANEOUS) ×8
SET TRI-LUMEN FLTR TB AIRSEAL (TUBING) ×3 IMPLANT
SOLUTION ELECTROLUBE (MISCELLANEOUS) ×3 IMPLANT
SPONGE LAP 4X18 X RAY DECT (DISPOSABLE) ×3 IMPLANT
STAPLE ECHEON FLEX 60 POW ENDO (STAPLE) IMPLANT
STAPLER RELOAD WHITE 60MM (STAPLE)
SURGIFLO W/THROMBIN 8M KIT (HEMOSTASIS) ×3 IMPLANT
SUT ETHILON 3 0 PS 1 (SUTURE) ×3 IMPLANT
SUT MNCRL AB 4-0 PS2 18 (SUTURE) ×6 IMPLANT
SUT PDS AB 1 CT1 27 (SUTURE) ×6 IMPLANT
SUT V-LOC BARB 180 2/0GR6 GS22 (SUTURE) ×3
SUT VIC AB 0 CT1 27 (SUTURE) ×12
SUT VIC AB 0 CT1 27XBRD ANTBC (SUTURE) ×4 IMPLANT
SUT VIC AB 2-0 SH 27 (SUTURE) ×9
SUT VIC AB 2-0 SH 27X BRD (SUTURE) ×2 IMPLANT
SUT VLOC BARB 180 ABS3/0GR12 (SUTURE) ×3
SUTURE V-LC BRB 180 2/0GR6GS22 (SUTURE) IMPLANT
SUTURE VLOC BRB 180 ABS3/0GR12 (SUTURE) ×1 IMPLANT
TAPE STRIPS DRAPE STRL (GAUZE/BANDAGES/DRESSINGS) ×1 IMPLANT
TOWEL OR 17X26 10 PK STRL BLUE (TOWEL DISPOSABLE) ×6 IMPLANT
TOWEL OR NON WOVEN STRL DISP B (DISPOSABLE) ×3 IMPLANT
TRAY FOLEY W/METER SILVER 16FR (SET/KITS/TRAYS/PACK) ×3 IMPLANT
TRAY LAPAROSCOPIC (CUSTOM PROCEDURE TRAY) ×3 IMPLANT
TROCAR BLADELESS OPT 5 100 (ENDOMECHANICALS) ×2 IMPLANT
TROCAR XCEL 12X100 BLDLESS (ENDOMECHANICALS) ×3 IMPLANT
WATER STERILE IRR 1000ML POUR (IV SOLUTION) ×6 IMPLANT

## 2017-01-04 NOTE — Anesthesia Procedure Notes (Addendum)
Procedure Name: Intubation Date/Time: 01/04/2017 12:54 PM Performed by: Lissa Morales Pre-anesthesia Checklist: Patient identified, Emergency Drugs available, Suction available and Patient being monitored Patient Re-evaluated:Patient Re-evaluated prior to inductionOxygen Delivery Method: Circle system utilized Preoxygenation: Pre-oxygenation with 100% oxygen Intubation Type: IV induction Ventilation: Mask ventilation without difficulty Laryngoscope Size: Mac, Glidescope and 4 Grade View: Grade III Tube type: Oral Tube size: 7.5 (parker flex tip) mm Number of attempts: 1 Airway Equipment and Method: Stylet and Oral airway Placement Confirmation: ETT inserted through vocal cords under direct vision,  positive ETCO2 and breath sounds checked- equal and bilateral Secured at: 22 cm Tube secured with: Tape Dental Injury: Teeth and Oropharynx as per pre-operative assessment  Difficulty Due To: Difficulty was anticipated, Difficult Airway- due to reduced neck mobility and Difficult Airway- due to dentition Comments: Elective  glidesope used secondary to poor dentition. MAP3,old cervical fractures. Kept neck in neutral position during intubation. Good visualization of cords.

## 2017-01-04 NOTE — Op Note (Signed)
Patrick Romero, Patrick Romero NO.:  [id number]  MEDICAL RECORD NO.:  [id number]  LOCATION:  PERIO                        FACILITY:  [facility]  PHYSICIAN:  [name], MD     DATE OF BIRTH:  March 11, 1961  DATE OF PROCEDURE: 01/04/2017                               OPERATIVE REPORT   DIAGNOSIS:  Right renal mass.  PROCEDURE:  Robotic-assisted laparoscopic right partial nephrectomy.  ESTIMATED BLOOD LOSS:  100 mL.  COMPLICATION:  None.  SPECIMENS:  Right partial nephrectomy for permanent pathology.  DRAINS: 1. Jackson-Pratt drain to bulb suction. 2. Foley catheter to straight drain.  FINDINGS: 1. One artery, two-vein right renovascular anatomy. 2. Approximately 50% exophytic right lower pole renal mass, solid.  ASSISTANT:  Debbrah Alar, PA.  INDICATION:  Patrick Romero is a 56 year old gentleman, who was found incidentally on imaging to have a right lower pole solid renal mass, dedicated MRI corroborated enhancement worrisome for localized renal cell carcinoma.  Options were discussed for management including surveillance protocols versus ablative therapy versus surgical extirpation with and without nephron sparing, and he wished to proceed with right robotic partial nephrectomy.  Informed consent was obtained and placed in the medical record.  PROCEDURE IN DETAIL:  The patient being Patrick Romero, was verified. The patient being right robotic partial nephrectomy was confirmed. Procedure was carried out.  Time-out was performed.  Intravenous antibiotics were administered.  General endotracheal anesthesia was introduced.  The patient placed into a right side up full flank position, applying 15 degrees of stable flexion, superior arm elevator, axillary roll, sequential compression devices, bottom leg bent, top leg straight.  Foley catheter had previously been placed.  Exquisite care was taken in positioning his cervical spine given known recent injury. Sterile field  was then created by prepping and draping the patient's entire right flank and abdomen using chlorhexidine gluconate and a high- flow, low-pressure pneumoperitoneum was obtained using Veress technique in the right lower quadrant having passed the aspiration and drop test. An 8-mm robotic camera port was placed and positioned approximately 4 fingerbreadths superolateral to the umbilicus.  Laparoscopic examination of the peritoneal cavity revealed no significant adhesions and no visceral injury.  Additional ports were then placed as follows:  Right subcostal 8-mm robotic port, subxiphoid 5 mm liver retraction port, right far lateral 8-mm robotic port 3 fingerbreadths superomedial to the anterior superior iliac spine, right paramedian inferior robotic port approximately 4 fingerbreadths superior to the pubic ramus and two assistant port sites in the midline; one approximately 3 fingerbreadths above the camera and one 3 fingerbreadths below.  Robot was docked and passed through the electronic checks.  Initial attention was directed at development of the retroperitoneum.  Incision was made lateral to the ascending colon from the area of the cecum towards the area of the hepatic flexure.  The colon was carefully swept medially.  The lower pole of the kidney was easily identified and placed on gentle lateral traction.  Dissection proceeded medial to this.  The duodenum was encountered and carefully Kocherized medially, such that it lie completely medial to the vena cava.  Ureter was identified, placed on gentle lateral traction.  Dissection proceeded within the triangle towards  the area of the renal hilum.  Renal hilum consisted of single artery, two-vein right renovascular anatomy.  The artery was somewhat early branching, was circumferentially mobilized at its trunk, marked with a vessel loop.  The mass was then identified in the anterior inferior aspect of the kidney as anticipated, it was  quite solid- appearing, it was dissected circumferentially at rim of fatly in apposition to the mass and it was scored and positioned approximately 3 mm beyond the area of obvious tumor parenchymal interface.  Ischemia was achieved with two bulldog clamps on the artery and partial nephrectomy was performed using cold scissors keeping what appeared to be a rim of normal parenchyma with the partial nephrectomy specimen, which was set aside.  First layer renorrhaphy was performed using running 3-0 V-Loc suture oversewing several small venous sinuses in figure-of-eight fashion.  There were no obvious entrances into the collecting system.  A Surgicel bolster was applied and three parenchymal apposition sutures of running Vicryl were applied.  The bulldog clamps were removed for total warm ischemia time of 14 minutes.  Hemostasis appeared excellent. Sponge and needle counts were correct.  The retroperitoneum was reapproximated after 10 mL of FloSeal was applied to the area of renorrhaphy.  Specimen was placed in EndoCatch bag.  All sponge and needle counts were correct.  A closed suction drain was brought through the previous lateral most robotic port site in the peritoneal cavity. Robot was then undocked.  The superior most assistant port site was closed at the level of the fascia using Carter-Thomason suture passer under direct vision and specimen was retrieved via the inferior most assistant port site, which was then closed at the level of the fascia using figure-of-eight PDS x3.  All incision sites were infiltrated with dilute lyophilized Marcaine and closed at the level of the skin using subcuticular Monocryl followed by Dermabond, and the procedure was terminated.  The patient tolerated the procedure well.  There were no immediate periprocedural complications.  The patient was taken to the postanesthesia care unit in stable condition.  Please note, surgical assistant, Debbrah Alar, was  absolutely crucial for all robotic portions of the procedure today.  She provided invaluable first assistant's retraction, suctioning, placement of vascular clamps, without which, this would not be possible.          ______________________________ [name], MD     TM/MEDQ  D:  01/04/2017  T:  01/04/2017  Job:  [id number]

## 2017-01-04 NOTE — Discharge Instructions (Signed)

## 2017-01-04 NOTE — H&P (Signed)
Patrick Romero is an 56 y.o. male.    Chief Complaint: Pre-op RIGHT robotic partial nephrectomy  HPI:   1 - RIGHT Renal Mass - 2.3cm extreme lower pole solid mass by [place] and then dedicated MRI 12/2016 c/w localized kidney cancer. 60% endophytic. 1 artery / 2 vein (accessory upper pole) renovascular anatomy.  Today "Patrick Romero" is seen to proceed with RIGHT robotic partial nephrectomy.   Past Medical History:  Diagnosis Date  . Arthritis   . Chronic back pain   . Chronic neck pain   . Depression   . Herniated disc, cervical   . Hypertension   . Sickle cell trait ([place])   . Stroke [facility])    weakness in right hand and leg , numbness on left     No past surgical history on file.  Family History  Problem Relation Age of Onset  . Hypertension Mother   . Lung cancer Father   . COPD Sister    Social History:  reports that he has been smoking Cigarettes.  He has been smoking about 0.50 packs per day. He has never used smokeless tobacco. He reports that he drinks alcohol. He reports that he does not use drugs.  Allergies: No Known Allergies  No prescriptions prior to admission.    No results found for this or any previous visit (from the past 48 hour(s)). No results found.  Review of Systems  Constitutional: Negative.   HENT: Negative.   Eyes: Negative.   Respiratory: Negative.   Cardiovascular: Negative.   Gastrointestinal: Negative.   Genitourinary: Negative.   Musculoskeletal: Negative.   Skin: Negative.   Neurological: Negative.   Endo/Heme/Allergies: Negative.   Psychiatric/Behavioral: Negative.     There were no vitals taken for this visit. Physical Exam  Constitutional: He appears well-developed.  HENT:  Head: Normocephalic.  Eyes: Pupils are equal, round, and reactive to light.  Neck: Normal range of motion.  Cardiovascular: Normal rate.   Respiratory: Effort normal.  GI: Soft.  Genitourinary: Penis normal.  Genitourinary Comments: No CVAT  Musculoskeletal:  Normal range of motion.  Neurological: He is alert.  Skin: Skin is warm.  Psychiatric: He has a normal mood and affect.     Assessment/Plan  1 - RIGHT Renal Mass - proceed as planned with RIGHT robotic partial nephrectomy. Risks, benefits, alternatives, expected peri-op course discussed previously and reiterated today.   [name], MD 01/04/2017, 6:49 AM

## 2017-01-04 NOTE — Interval H&P Note (Signed)
History and Physical Interval Note:  01/04/2017 12:41 PM  Patrick Romero  has presented today for surgery, with the diagnosis of RIGHT RENAL MASS  The various methods of treatment have been discussed with the patient and family. After consideration of risks, benefits and other options for treatment, the patient has consented to  Procedure(s): XI ROBOTIC ASSITED PARTIAL NEPHRECTOMY (Right) as a surgical intervention .  The patient's history has been reviewed, patient examined, no change in status, stable for surgery.  I have reviewed the patient's chart and labs.  Questions were answered to the patient's satisfaction.     Ferrel [place]ington

## 2017-01-04 NOTE — Brief Op Note (Signed)
01/04/2017  2:56 PM  PATIENT:  Patrick Romero  56 y.o. male  PRE-OPERATIVE DIAGNOSIS:  RIGHT RENAL MASS  POST-OPERATIVE DIAGNOSIS:  RIGHT RENAL MASS  PROCEDURE:  Procedure(s): XI ROBOTIC ASSITED PARTIAL NEPHRECTOMY (Right)  SURGEON:  Surgeon(s) and Role:    [name], MD - Primary  PHYSICIAN ASSISTANT:   ASSISTANTS: Clemetine Marker PA   ANESTHESIA:   local and general  EBL:  Total I/O In: 1000 [I.V.:1000] Out: -   BLOOD ADMINISTERED:none  DRAINS: 1 - JP to bulb, 2 - Foley to gravity   LOCAL MEDICATIONS USED:  MARCAINE     SPECIMEN:  Source of Specimen:  Rt partial nephrectomy  DISPOSITION OF SPECIMEN:  PATHOLOGY  COUNTS:  YES  TOURNIQUET:  * No tourniquets in log *  DICTATION: .Other Dictation: Dictation Number [id number]  PLAN OF CARE: Admit to inpatient   PATIENT DISPOSITION:  PACU - hemodynamically stable.   Delay start of Pharmacological VTE agent (>24hrs) due to surgical blood loss or risk of bleeding: yes

## 2017-01-04 NOTE — Transfer of Care (Signed)
Immediate Anesthesia Transfer of Care Note  Patient: Patrick Romero  Procedure(s) Performed: Procedure(s): XI ROBOTIC ASSITED PARTIAL NEPHRECTOMY (Right)  Patient Location: PACU  Anesthesia Type:General  Level of Consciousness: awake, alert , oriented and patient cooperative  Airway & Oxygen Therapy: Patient Spontanous Breathing and Patient connected to face mask oxygen  Post-op Assessment: Report given to RN and Post -op Vital signs reviewed and unstable, Anesthesiologist notified  Post vital signs: stable  Last Vitals:  Vitals:   01/04/17 0947 01/04/17 1521  BP: (!) 163/103   Pulse: (!) 113   Resp: 18   Temp: 36.9 C (P) 36.4 C    Last Pain:  Vitals:   01/04/17 1009  TempSrc:   PainSc: 2       Patients Stated Pain Goal: 3 (28/24/17 5301)  Complications: No apparent anesthesia complications  Movin all extremities as preop with weakness on right side

## 2017-01-04 NOTE — Anesthesia Preprocedure Evaluation (Addendum)
Anesthesia Evaluation  Patient identified by MRN, date of birth, ID band Patient awake    Reviewed: Allergy & Precautions, NPO status , Patient's Chart, lab work & pertinent test results  History of Anesthesia Complications (+) AWARENESS UNDER ANESTHESIA  Airway Mallampati: III  TM Distance: >3 FB Neck ROM: Limited    Dental  (+) Poor Dentition, Missing, Chipped,    Pulmonary Current Smoker,    breath sounds clear to auscultation- rhonchi       Cardiovascular hypertension, Pt. on medications + Peripheral Vascular Disease   Rhythm:Regular Rate:Normal     Neuro/Psych PSYCHIATRIC DISORDERS Depression R arm, R leg remains weak.  CVA, Residual Symptoms    GI/Hepatic Neg liver ROS, GERD  Medicated,  Endo/Other  negative endocrine ROS  Renal/GU Renal disease  negative genitourinary   Musculoskeletal  (+) Arthritis , Osteoarthritis,    Abdominal Normal abdominal exam  (+)   Peds negative pediatric ROS (+)  Hematology  (+) Sickle cell trait ,   Anesthesia Other Findings   Reproductive/Obstetrics negative OB ROS                          Anesthesia Physical Anesthesia Plan  ASA: II  Anesthesia Plan: General   Post-op Pain Management:    Induction: Intravenous  PONV Risk Score and Plan: 2 and Ondansetron and Dexamethasone  Airway Management Planned: Oral ETT and Video Laryngoscope Planned  Additional Equipment:   Intra-op Plan:   Post-operative Plan: Extubation in OR  Informed Consent: I have reviewed the patients History and Physical, chart, labs and discussed the procedure including the risks, benefits and alternatives for the proposed anesthesia with the patient or authorized representative who has indicated his/her understanding and acceptance.   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 Pt has completed rehab and is no longer utilizing c collar. Pt has been released from c collar requirement per patient. Pt states c collar is at home. Will proceed with c spine precautions, pt informed of risk.  )     Anesthesia Quick Evaluation

## 2017-01-04 NOTE — Anesthesia Postprocedure Evaluation (Signed)
Anesthesia Post Note  Patient: Patrick Romero  Procedure(s) Performed: Procedure(s) (LRB): XI ROBOTIC ASSITED PARTIAL NEPHRECTOMY (Right)     Patient location during evaluation: PACU Anesthesia Type: General Level of consciousness: awake and alert, oriented and patient cooperative Pain management: pain level controlled Vital Signs Assessment: post-procedure vital signs reviewed and stable Respiratory status: spontaneous breathing, nonlabored ventilation and respiratory function stable Cardiovascular status: blood pressure returned to baseline and stable Postop Assessment: no signs of nausea or vomiting Anesthetic complications: no    Last Vitals:  Vitals:   01/04/17 1630 01/04/17 1645  BP: 124/88 (!) 158/90  Pulse: 85 85  Resp: 15 14  Temp:  36.3 C    Last Pain:  Vitals:   01/04/17 1521  TempSrc:   PainSc: Asleep                 Loral Campi,E. Avyana Puffenbarger

## 2017-01-05 DIAGNOSIS — C641 Malignant neoplasm of right kidney, except renal pelvis: Secondary | ICD-10-CM | POA: Diagnosis not present

## 2017-01-05 LAB — BASIC METABOLIC PANEL
Anion gap: 7 (ref 5–15)
BUN: 12 mg/dL (ref 6–20)
CALCIUM: 8.8 mg/dL — AB (ref 8.9–10.3)
CHLORIDE: 102 mmol/L (ref 101–111)
CO2: 26 mmol/L (ref 22–32)
Creatinine, Ser: 1.15 mg/dL (ref 0.61–1.24)
GFR calc non Af Amer: >60 mL/min (ref >60)
Glucose, Bld: 158 mg/dL — ABNORMAL HIGH (ref 65–99)
POTASSIUM: 4.8 mmol/L (ref 3.5–5.1)
Sodium: 135 mmol/L (ref 135–145)

## 2017-01-05 LAB — HEMOGLOBIN AND HEMATOCRIT, BLOOD
HEMATOCRIT: 30.6 % — AB (ref 39.0–52.0)
Hemoglobin: 9.7 g/dL — ABNORMAL LOW (ref 13.0–17.0)

## 2017-01-05 MED ORDER — HYDROMORPHONE HCL 1 MG/ML IJ SOLN: 0.5000 mg | INTRAMUSCULAR | Status: DC | PRN

## 2017-01-05 MED ORDER — SENNOSIDES-DOCUSATE SODIUM 8.6-50 MG PO TABS: 1.0000 | ORAL_TABLET | Freq: Two times a day (BID) | ORAL | 0 refills | Status: DC

## 2017-01-05 NOTE — Progress Notes (Signed)
UROLOGY PROGRESS NOTES  Assessment:  Patrick Romero is a 56 y.o. male with a history of right renal mass s/p right partial nephrectomy on  01/05/17 doing well. POD #1.   Interval:    Plan:   Neuro: - pain control with tylenol, oxycodone, PRN dilaudid. Will encourage PO pain medicines today   Respiratory: SORA, wean to room air   CV: HDS - continue home meds   FEN/GI: - medlock, regular diet  - CTM JP drain output, will d/c prior to discharge if continues with low output   GU: - monitor UOP - d/c foley, TOV - creatinine 1.1, CTM   Heme/ID: - afebrile, stable - WBC WNL and hgb stable   PPx: OOB/IS   Dispo: Home today versus tomorrow   Jonna Clark, MD Urologic Surgery Resident  -------   Subjective: Doing well. Feels right arm weakness is at baseline but arm feels sore. Tolerated clears without nausea. Bedrest overnight. Hungry for food this AM. Pain well controlled  With dilaudid PRN and scheduled tylenol.  Objective:  Vital signs in last 24 hours: Temp:  [97.3 F (36.3 C)-98.5 F (36.9 C)] 97.9 F (36.6 C) (06/28 0406) Pulse Rate:  [80-113] 87 (06/28 0406) Resp:  [14-21] 18 (06/28 0406) BP: (110-163)/(81-109) 130/88 (06/28 0406) SpO2:  [97 %-100 %] 100 % (06/28 0406) Weight:  [63 kg (139 lb)] 63 kg (139 lb) (06/27 1009)  06/27 0701 - 06/28 0700 In: 2866.3 [I.V.:2856.3] Out: 1512 [Urine:1410; Drains:52; Blood:50]   Physical Exam:  General:  well-developed and well-nourished  in NAD, lying in bed, alert & oriented, pleasant HEENT: [place]/AT, EOMI, sclera anicteric, hearing grossly intact, no nasal discharge, MMM Respiratory: nonlabored respirations, satting well on RA, symmetrical chest rise Cardiovascular: appropriate perfusion  Abdominal: soft, NTTP, nondistended, surgical incisions c/d/i without signs of exudate/erythema, JP drain with minimal serosanguinous drainage  GU: foley catheter with clear yellow urine   Extremities: warm, well-perfused, no  c/c/e Neuro: no focal deficits  Data Review: Results for orders placed or performed during the hospital encounter of 01/04/17 (from the past 24 hour(s))  Type and screen [place]     Status: None   Collection Time: 01/04/17 10:22 AM  Result Value Ref Range   ABO/RH(D) A POS    Antibody Screen NEG    Sample Expiration 01/07/2017   Hemoglobin and hematocrit, blood     Status: Abnormal   Collection Time: 01/04/17  3:29 PM  Result Value Ref Range   Hemoglobin 9.8 (L) 13.0 - 17.0 g/dL   HCT 30.2 (L) 39.0 - 53.9 %  Basic metabolic panel     Status: Abnormal   Collection Time: 01/05/17  5:04 AM  Result Value Ref Range   Sodium 135 135 - 145 mmol/L   Potassium 4.8 3.5 - 5.1 mmol/L   Chloride 102 101 - 111 mmol/L   CO2 26 22 - 32 mmol/L   Glucose, Bld 158 (H) 65 - 99 mg/dL   BUN 12 6 - 20 mg/dL   Creatinine, Ser 1.15 0.61 - 1.24 mg/dL   Calcium 8.8 (L) 8.9 - 10.3 mg/dL   GFR calc non Af Amer >60 >60 mL/min   GFR calc Af Amer >60 >60 mL/min   Anion gap 7 5 - 15  Hemoglobin and hematocrit, blood     Status: Abnormal   Collection Time: 01/05/17  5:04 AM  Result Value Ref Range   Hemoglobin 9.7 (L) 13.0 - 17.0 g/dL   HCT 30.6 (  L) 39.0 - 52.0 %

## 2017-01-05 NOTE — Discharge Summary (Signed)
Physician Discharge Summary  Patient ID: Patrick Romero MRN: [id number] DOB/AGE: 02-01-1961 56 y.o.  Admit date: 01/04/2017 Discharge date: 01/05/2017  Admission Diagnoses: RIGHT Renal Mass  Discharge Diagnoses:  Active Problems:   Renal mass   Discharged Condition: good  Hospital Course:  Pt underwent RIGHT robotic partial nephrectomy on 01/04/17, the day of admission, without acute complications. He was admitted to 4th floor Urology service post-op where he began his recovery. By the afternoon of POD 1, he is ambulatory, tollerating PO nutrition, voiding, and felt to be adequate for discharge. JP drain removed priro to DC as output scant. Cr 1.15, Hgb 9.8, pathology pending at discharge.   Consults: None  Significant Diagnostic Studies: labs: as per above  Treatments: surgery: as per above  Discharge Exam: Blood pressure (!) 141/92, pulse 99, temperature 98.2 F (36.8 C), temperature source Oral, resp. rate 18, height 6' (1.829 m), weight 63 kg (139 lb), SpO2 100 %. General appearance: alert, cooperative and appears stated age Eyes: negative Nose: Nares normal. Septum midline. Mucosa normal. No drainage or sinus tenderness. Throat: lips, mucosa, and tongue normal; teeth and gums normal Neck: supple, symmetrical, trachea midline Back: symmetric, no curvature. ROM normal. No CVA tenderness. Resp: non-labored on room air Cardio: mild regular tachycardia as per baseline.  GI: soft, non-tender; bowel sounds normal; no masses,  no organomegaly Male genitalia: normal Extremities: extremities normal, atraumatic, no cyanosis or edema Pulses: 2+ and symmetric Skin: Skin color, texture, turgor normal. No rashes or lesions Neurologic: Grossly normal Incision/Wound: recent port sites c/d/i. JP with scant serosanguinous output, removed and dry dressing placed.   Disposition: 06-Home-Health Care Svc   Allergies as of 01/05/2017   No Known Allergies     Medication List    STOP  taking these medications   aspirin EC 81 MG tablet   multivitamins ther. w/minerals Tabs tablet     TAKE these medications   amLODipine 5 MG tablet Commonly known as:  NORVASC Take one tablet by mouth once a day.   atorvastatin 10 MG tablet Commonly known as:  LIPITOR Take 1 tablet (10 mg total) by mouth daily.   baclofen 10 MG tablet Commonly known as:  LIORESAL TAKE 1 TABLET 3 TIMES A DAY   cyclobenzaprine 5 MG tablet Commonly known as:  FLEXERIL TAKE 1 TABLET BY MOUTH 3 TIMES A DAY AS NEEDED FOR MUSCLE SPASM   diclofenac 75 MG EC tablet Commonly known as:  VOLTAREN Take 1 tablet (75 mg total) by mouth 2 (two) times daily with a meal.   gabapentin 300 MG capsule Commonly known as:  NEURONTIN Take 1 capsule (300 mg total) by mouth 3 (three) times daily.   HYDROcodone-acetaminophen 5-325 MG tablet Commonly known as:  NORCO Take 1-2 tablets by mouth every 6 (six) hours as needed for moderate pain or severe pain.   nicotine 21 mg/24hr patch Commonly known as:  NICODERM CQ - dosed in mg/24 hours Place 1 patch (21 mg total) onto the skin daily.   polyethylene glycol packet Commonly known as:  MIRALAX / GLYCOLAX Take 17 g by mouth 2 (two) times daily.   senna-docusate 8.6-50 MG tablet Commonly known as:  Senokot-S Take 1 tablet by mouth 2 (two) times daily. While taking pain meds to prevent constipation.      Follow-up Information    [name], MD On 01/19/2017.   Specialty:  Urology Why:  at 10:15 AM for MD visit. Dr. Tresa Moore will call you with pathology result when  available.  Contact information: [place] [address] [phone]           Signed: [name] 01/05/2017, 1:21 PM

## 2017-01-13 NOTE — Addendum Note (Signed)
Addendum  created 01/13/17 0835 by Lissa Morales, CRNA   Anesthesia Event edited, Anesthesia Intra Meds edited

## 2017-01-17 ENCOUNTER — Encounter: Payer: Medicaid Other | Attending: Physical Medicine & Rehabilitation | Admitting: Physical Medicine & Rehabilitation

## 2017-01-17 ENCOUNTER — Encounter: Payer: Self-pay | Admitting: Physical Medicine & Rehabilitation

## 2017-01-17 VITALS — BP 137/94 | HR 105

## 2017-01-17 DIAGNOSIS — R202 Paresthesia of skin: Secondary | ICD-10-CM | POA: Diagnosis not present

## 2017-01-17 DIAGNOSIS — S14109S Unspecified injury at unspecified level of cervical spinal cord, sequela: Secondary | ICD-10-CM | POA: Diagnosis not present

## 2017-01-17 DIAGNOSIS — G8250 Quadriplegia, unspecified: Secondary | ICD-10-CM | POA: Diagnosis not present

## 2017-01-17 DIAGNOSIS — S14129D Central cord syndrome at unspecified level of cervical spinal cord, subsequent encounter: Secondary | ICD-10-CM

## 2017-01-17 DIAGNOSIS — M7501 Adhesive capsulitis of right shoulder: Secondary | ICD-10-CM | POA: Diagnosis not present

## 2017-01-17 DIAGNOSIS — S14129A Central cord syndrome at unspecified level of cervical spinal cord, initial encounter: Secondary | ICD-10-CM | POA: Insufficient documentation

## 2017-01-17 DIAGNOSIS — D649 Anemia, unspecified: Secondary | ICD-10-CM | POA: Diagnosis not present

## 2017-01-17 DIAGNOSIS — M25511 Pain in right shoulder: Secondary | ICD-10-CM | POA: Insufficient documentation

## 2017-01-17 DIAGNOSIS — N281 Cyst of kidney, acquired: Secondary | ICD-10-CM | POA: Diagnosis not present

## 2017-01-17 DIAGNOSIS — G8929 Other chronic pain: Secondary | ICD-10-CM | POA: Insufficient documentation

## 2017-01-17 DIAGNOSIS — C641 Malignant neoplasm of right kidney, except renal pelvis: Secondary | ICD-10-CM | POA: Diagnosis not present

## 2017-01-17 DIAGNOSIS — F17210 Nicotine dependence, cigarettes, uncomplicated: Secondary | ICD-10-CM | POA: Insufficient documentation

## 2017-01-17 DIAGNOSIS — M542 Cervicalgia: Secondary | ICD-10-CM | POA: Insufficient documentation

## 2017-01-17 DIAGNOSIS — M79609 Pain in unspecified limb: Secondary | ICD-10-CM

## 2017-01-17 DIAGNOSIS — S14129S Central cord syndrome at unspecified level of cervical spinal cord, sequela: Secondary | ICD-10-CM | POA: Diagnosis not present

## 2017-01-17 DIAGNOSIS — M792 Neuralgia and neuritis, unspecified: Secondary | ICD-10-CM

## 2017-01-17 DIAGNOSIS — M545 Low back pain: Secondary | ICD-10-CM | POA: Insufficient documentation

## 2017-01-17 MED ORDER — GABAPENTIN 300 MG PO CAPS: 600.0000 mg | ORAL_CAPSULE | Freq: Three times a day (TID) | ORAL | 2 refills | Status: DC

## 2017-01-17 MED ORDER — DICLOFENAC SODIUM 75 MG PO TBEC: 75.0000 mg | DELAYED_RELEASE_TABLET | Freq: Two times a day (BID) | ORAL | 3 refills | Status: DC

## 2017-01-17 MED ORDER — CYCLOBENZAPRINE HCL 5 MG PO TABS: ORAL_TABLET | ORAL | 3 refills | Status: DC

## 2017-01-17 NOTE — Patient Instructions (Signed)
BACLOFEN TAPER OFF: 10MG  TWICE DAILY FOR 4 DAYS, THEN ONCE DAILY FOR 4 DAYS, THEN STOP  GABAPENTIN: 300-300-600 FOR 4 DAYS   THEN 600-300-600 FOR 4 DAYS  THEN 300 3X DAILY THEREAFTER.

## 2017-01-17 NOTE — Progress Notes (Signed)
Subjective:    Patient ID: Patrick Romero, male    DOB: 1960-11-02, 56 y.o.   MRN: [id number]  HPI  Patrick Romero is here in follow up of his central cord syndrome. He is still having a lot pain in his neck and arms. He feels like his arms are "tuning forks" and are constantly bothering him. He also is falling at home while walking and once when he was working in the garden. He is using a quad cane for balance.  He had surgery last month to remove a right renal cell carcinoma by Dr. Tresa Moore. Further follow up is shceulded this month with urology and oncology.    He has been taking baclofen for spasms (which he doesn't feel is helping). He ran out of flexeril. He hasn't resumed voltaren since the surgery. He remains on gabapentin 300mg  TID wihtout any tolerance issues.   Pain Inventory Average Pain 7 Pain Right Now 7 My pain is sharp and tingling  In the last 24 hours, has pain interfered with the following? General activity . Relation with others . Enjoyment of life . What TIME of day is your pain at its worst? . Sleep (in general) Poor  Pain is worse with: walking, bending and standing Pain improves with: therapy/exercise and medication Relief from Meds: 0  Mobility walk with assistance use a cane ability to climb steps?  yes do you drive?  no  Function not employed: date last employed . I need assistance with the following:  dressing, bathing, meal prep, household duties and shopping  Neuro/Psych weakness numbness tremor tingling trouble walking spasms dizziness depression  Prior Studies Any changes since last visit?  no  Physicians involved in your care Any changes since last visit?  no   Family History  Problem Relation Age of Onset  . Hypertension Mother   . Lung cancer Father   . COPD Sister    Social History   Social History  . Marital status: Single    Spouse name: N/A  . Number of children: N/A  . Years of education: N/A   Social History Main  Topics  . Smoking status: Current Every Day Smoker    Packs/day: 0.50    Types: Cigarettes  . Smokeless tobacco: Never Used  . Alcohol use Yes     Comment: occ beer   . Drug use: No  . Sexual activity: Yes   Other Topics Concern  . Not on file   Social History Narrative  . No narrative on file   Past Surgical History:  Procedure Laterality Date  . ROBOTIC ASSITED PARTIAL NEPHRECTOMY Right 01/04/2017   Procedure: XI ROBOTIC ASSITED PARTIAL NEPHRECTOMY;  Surgeon: [name], MD;  Location: [facility];  Service: Urology;  Laterality: Right;   Past Medical History:  Diagnosis Date  . Arthritis   . Chronic back pain   . Chronic neck pain   . Depression   . Herniated disc, cervical   . Hypertension   . Sickle cell trait ([place])   . Stroke [facility])    weakness in right hand and leg , numbness on left    There were no vitals taken for this visit.  Opioid Risk Score:   Fall Risk Score:  `1  Depression screen PHQ 2/9  Depression screen [facility] 2/9 12/16/2016 11/23/2016 10/26/2016  Decreased Interest 1 1 1   Down, Depressed, Hopeless 2 1 2   PHQ - 2 Score 3 2 3   Altered sleeping 1 3 1   Tired,  decreased energy 2 1 1   Change in appetite - 0 0  Feeling bad or failure about yourself  0 0 1  Trouble concentrating 0 0 1  Moving slowly or fidgety/restless 2 1 1   Suicidal thoughts 0 0 0  PHQ-9 Score 8 7 8   Difficult doing work/chores - - Somewhat difficult     Review of Systems  Constitutional: Negative.   HENT: Negative.   Eyes: Negative.   Respiratory: Negative.   Cardiovascular: Negative.   Gastrointestinal: Negative.   Endocrine: Negative.   Genitourinary: Negative.   Musculoskeletal: Negative.   Skin: Negative.   Allergic/Immunologic: Negative.   Neurological: Negative.   Hematological: Negative.   Psychiatric/Behavioral: Negative.   All other systems reviewed and are negative.      Objective:   Physical Exam  HEENT: Poor dentition. Normocephalic. Atraumatic.    Cardio: RRR Neck: normal appearance. Head forward slightly Resp: CTA bilaterally normal effort GI: BS NT Skin: CDI  Neuro: Alert and oriented x 3+ to 4/5 left deltoid, biceps,4 triceps, wrist extension 4/5, hand grip 4-/5 . 3-/5 right deltoid muscle, biceps,3 triceps, wrist extension. Hand grip 2+ to 3-.  LLE: 4-/5 at the ankle dorsiflexor, plantar flexor, 4 KE/HF RLE: 3+ HF, 3/5 KE and 3/5 ADF/PF.  Decreased sensation in U>L, R>L Continued narrow base with some scissoring.  Musc: right shoulder with mild sublux. Passive abduction to 70 degrees, decreased ER/IR with passive movement---tightness limits ability to lift arm over head once again. Pt had pain with end range of motion as well. Pain along spinous process of C7. Limited cervical ROM in all fields.  Psych pleasant and appropriate.   Gen.: NAD        Assessment & Plan:  1. 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 --reviewed gait and safety issues. He is not overly spastic. So I believe his issues are motor/sensory related             -refer to outpt rehab PT, OT to improve ue rom/strength as well as lower ext strength and gait quality.  2.  Pain Management:               -refill diclofenac 60mg  BID             -gabapentin for neuropathic pain--increase to 600mg  TID 3. Spasticity: actually appears improved. Baclofen may actually be adversely affecting balance.   -wean off baclofen and observe  -will use flexeril prn for breakthrough spasms             -continue gait/strengthening/ROM 4. Anemia:follow up per primary  5. Right renal carcinoma s/p partial nephrectomy right (01/04/17)  -scheduled to follow up with urology/oncology  -prostate work up also.  6. . ETOH abuse/Tobacco: continue etoh abstinence 7. Right shoulder pain/capsulitis: continue stretching. outpt OT   25 minutes of face to face patient care time were spent during this visit. All questions were encouraged and  answered. Folllow up in 6 weeks. Greater than 50% of time during this encounter was spent counseling patient/family in regard to gait mechanics, pain mgt, HEP.

## 2017-01-23 ENCOUNTER — Telehealth: Payer: Self-pay | Admitting: *Deleted

## 2017-01-23 NOTE — Telephone Encounter (Signed)
Mr Scalise called to update his phone number to [phone].

## 2017-01-25 ENCOUNTER — Ambulatory Visit: Payer: Medicaid Other | Admitting: Family Medicine

## 2017-02-06 ENCOUNTER — Telehealth: Payer: Self-pay | Admitting: *Deleted

## 2017-02-06 NOTE — Telephone Encounter (Signed)
Mr Patrick Romero called and said his tramadol and Lyrica need authorization.  I called him back and left message: we do not have him being prescribed those medications as of last visit with Dr Naaman Plummer.

## 2017-02-10 ENCOUNTER — Ambulatory Visit: Payer: Medicaid Other | Admitting: Occupational Therapy

## 2017-02-10 ENCOUNTER — Encounter: Payer: Self-pay | Admitting: Occupational Therapy

## 2017-02-10 ENCOUNTER — Ambulatory Visit: Payer: Medicaid Other | Attending: Physical Medicine & Rehabilitation

## 2017-02-10 VITALS — BP 140/88 | HR 98

## 2017-02-10 DIAGNOSIS — X58XXXS Exposure to other specified factors, sequela: Secondary | ICD-10-CM | POA: Diagnosis not present

## 2017-02-10 DIAGNOSIS — Z905 Acquired absence of kidney: Secondary | ICD-10-CM | POA: Diagnosis not present

## 2017-02-10 DIAGNOSIS — M6281 Muscle weakness (generalized): Secondary | ICD-10-CM

## 2017-02-10 DIAGNOSIS — I69915 Cognitive social or emotional deficit following unspecified cerebrovascular disease: Secondary | ICD-10-CM

## 2017-02-10 DIAGNOSIS — M25611 Stiffness of right shoulder, not elsewhere classified: Secondary | ICD-10-CM | POA: Diagnosis not present

## 2017-02-10 DIAGNOSIS — R2681 Unsteadiness on feet: Secondary | ICD-10-CM

## 2017-02-10 DIAGNOSIS — M25511 Pain in right shoulder: Secondary | ICD-10-CM

## 2017-02-10 DIAGNOSIS — R278 Other lack of coordination: Secondary | ICD-10-CM | POA: Diagnosis not present

## 2017-02-10 DIAGNOSIS — R2689 Other abnormalities of gait and mobility: Secondary | ICD-10-CM

## 2017-02-10 DIAGNOSIS — R208 Other disturbances of skin sensation: Secondary | ICD-10-CM | POA: Diagnosis not present

## 2017-02-10 DIAGNOSIS — D573 Sickle-cell trait: Secondary | ICD-10-CM | POA: Diagnosis not present

## 2017-02-10 DIAGNOSIS — M79642 Pain in left hand: Secondary | ICD-10-CM | POA: Insufficient documentation

## 2017-02-10 DIAGNOSIS — S14129S Central cord syndrome at unspecified level of cervical spinal cord, sequela: Secondary | ICD-10-CM

## 2017-02-10 DIAGNOSIS — M79641 Pain in right hand: Secondary | ICD-10-CM | POA: Insufficient documentation

## 2017-02-10 DIAGNOSIS — I10 Essential (primary) hypertension: Secondary | ICD-10-CM | POA: Insufficient documentation

## 2017-02-10 DIAGNOSIS — C641 Malignant neoplasm of right kidney, except renal pelvis: Secondary | ICD-10-CM | POA: Insufficient documentation

## 2017-02-10 DIAGNOSIS — M542 Cervicalgia: Secondary | ICD-10-CM | POA: Insufficient documentation

## 2017-02-10 DIAGNOSIS — M549 Dorsalgia, unspecified: Secondary | ICD-10-CM | POA: Insufficient documentation

## 2017-02-10 DIAGNOSIS — F329 Major depressive disorder, single episode, unspecified: Secondary | ICD-10-CM | POA: Insufficient documentation

## 2017-02-10 DIAGNOSIS — G8929 Other chronic pain: Secondary | ICD-10-CM

## 2017-02-10 NOTE — Therapy (Signed)
[facility] [address] [place], [place], [address] Phone: [phone]   Fax:  [phone]  Occupational Therapy Evaluation  Patient Details  Name: Patrick Romero MRN: [id number] Date of Birth: 11/21/1960 Referring Provider: Dr Naaman Plummer  Encounter Date: 02/10/2017      OT End of Session - 02/10/17 1101    Visit Number 1   Number of Visits 13   Date for OT Re-Evaluation 04/11/17   Authorization Type Medicaid - need to confirm [facility] visits   Authorization - Visit Number 1   OT Start Time 0930   OT Stop Time 1015   OT Time Calculation (min) 45 min   Activity Tolerance Patient tolerated treatment well   Behavior During Therapy [facility] for tasks assessed/performed      Past Medical History:  Diagnosis Date  . Arthritis   . Chronic back pain   . Chronic neck pain   . Depression   . Herniated disc, cervical   . Hypertension   . Sickle cell trait ([place])   . Stroke [facility])    weakness in right hand and leg , numbness on left     Past Surgical History:  Procedure Laterality Date  . ROBOTIC ASSITED PARTIAL NEPHRECTOMY Right 01/04/2017   Procedure: XI ROBOTIC ASSITED PARTIAL NEPHRECTOMY;  Surgeon: [name], MD;  Location: [facility];  Service: Urology;  Laterality: Right;    Vitals:   02/10/17 0933  BP: 140/88  Pulse: 98        Subjective Assessment - 02/10/17 0936    Subjective  I am going to try to gain some more strength in right side.  I am right handed.     Currently in Pain? Yes   Pain Score 6    Pain Location Shoulder   Pain Orientation Right;Left   Pain Descriptors / Indicators Aching;Dull   Pain Type Chronic pain   Pain Onset More than a month ago   Pain Frequency Constant   Aggravating Factors  Lying down    Pain Relieving Factors sitting up, heat, ice           [facility] OT Assessment - 02/10/17 0938      Assessment   Diagnosis central cord syndrome   Referring Provider Dr Naaman Plummer   Onset Date  08/31/16   Prior Therapy [facility] 02/26 - 09/30/2016, then home health therapy     Precautions   Precautions Fall   Required Braces or Orthoses Other Brace/Splint  Toe cap     Restrictions   Weight Bearing Restrictions No     Balance Screen   Has the patient fallen in the past 6 months Yes   How many times? 6   Has the patient had a decrease in activity level because of a fear of falling?  Yes     Home  Environment   Family/patient expects to be discharged to: Private residence   Living Arrangements Other relatives   Available Help at Discharge Family   Type of [place] One level    Alternate Level Stairs-Rails Right  and left   Bathroom Shower/Tub Tub/Shower unit;Curtain   Biochemist, clinical Yes   How accessible --  quad cane   Adaptive equipment Other (comment)  universal cuff - long arm   Home Equipment Cane -quad;Bedside commode;Hand held shower head;Grab bars - tub/shower;Tub bench   Lives With Family  sister and son  Prior Function   Level of Independence Independent with basic ADLs;Independent with household mobility without device;Independent with community mobility without device;Independent with homemaking with ambulation   Vocation On disability   Vocation Requirements city of [employer]   Leisure basketbal, pool, video games     ADL   Eating/Feeding Minimal assistance   Grooming Minimal assistance   Upper Body Bathing Modified independent   Lower Body Bathing Modified independent   Upper Body Dressing Minimal assistance   Lower Body Dressing Modified independent   Toilet Transfer Modified independent   Toileting - Clothing Manipulation Modified independent   Toileting -  Hygiene Modified Independent   Tub/Shower Transfer Modified independent   Equipment Used Cane   ADL comments Predominantly using non dominant LUE     IADL   Prior Level of Function Shopping independent    Shopping Needs to be accompanied on any shopping trip;Assistance for transportation   Prior Level of Function Light Housekeeping independent   Light Housekeeping Performs light daily tasks such as dishwashing, bed making   Prior Level of Function Meal Prep independent   Meal Prep Able to complete simple cold meal and snack prep     Written Expression   Dominant Hand Right   Handwriting 100% legible  wearing wrist support     Vision - History   Baseline Vision Wears glasses only for reading   Visual History Cataracts   Patient Visual Report --  cataracts in both eyes   Additional Comments No changes with recent fall     Vision Assessment   Eye Alignment Within Functional Limits     Activity Tolerance   Activity Tolerance --  Patient indicates fatigue is a large factor limiting activit   Sitting Balance Supports self with one extremity     Cognition   Overall Cognitive Status Impaired/Different from baseline   Area of Impairment Memory;Attention     Observation/Other Assessments   Focus on Therapeutic Outcomes (FOTO)  Neuro QOL UE 29.4     Sensation   Light Touch Impaired by gross assessment  has paresthesias constantly   Stereognosis Appears Intact     Coordination   9 Hole Peg Test Right;Left   Right 9 Hole Peg Test 39.60 with wrist support, 35.78 without wrist support   Left 9 Hole Peg Test 37.72     Praxis   Praxis Intact     Tone   Assessment Location Right Upper Extremity     ROM / Strength   AROM / PROM / Strength AROM;PROM;Strength     AROM   Overall AROM  Deficits   AROM Assessment Site Shoulder   Right/Left Shoulder Right;Left   Right Shoulder Flexion 60 Degrees   Left Shoulder Flexion 110 Degrees     PROM   PROM Assessment Site Shoulder   Right/Left Shoulder Right;Left   Right Shoulder Flexion 75 Degrees   Right Shoulder ABduction 75 Degrees   Right Shoulder External Rotation 15 Degrees   Left Shoulder Flexion 123 Degrees     Hand Function    Right Hand Gross Grasp Impaired   Right Hand Grip (lbs) 15   Right Hand Lateral Pinch 7 lbs   Left Hand Gross Grasp Functional   Left Hand Grip (lbs) 70   Left Hand Lateral Pinch 18 lbs     RUE Tone   RUE Tone Moderate;Modified [place]  OT Education - 02/10/17 1100    Education provided Yes   Education Details self range of motion to right wrist   Person(s) Educated Patient   Methods Explanation;Demonstration   Comprehension Verbalized understanding;Returned demonstration          OT Short Term Goals - 02/10/17 1115      OT SHORT TERM GOAL #1   Title Patient will complete a home exercise program designed to improve right upper extremity range of motion with min assist   Baseline Patient is not completing a self range of motion program and has significant tightness and PROM restriction in right shoulder   Time 4   Period Weeks   Status New   Target Date 03/27/17  to allopw grace period fro [facility] aproval     OT SHORT TERM GOAL #2   Title Patient will complete a home exercise program designed to improve right hand strangth   Baseline Patient does not have an exercise program to improve hand strength and has significant right hand weakness   Time 4   Period Weeks   Status New   Target Date 03/27/17     OT SHORT TERM GOAL #3   Title Patient will cut food using two hands with modified independence   Baseline Patient unable to use right hand to assist with cutting food   Time 4   Period Weeks   Status New     OT SHORT TERM GOAL #4   Title Patient will retrieve paper or coins from right pants pocket with right hand and increased time.   Baseline Patient needs min assist to retrieve items from right pants pocket with right hand.   Time 4   Period Weeks   Status New     OT SHORT TERM GOAL #5   Title Patient will don a more form fitting pullover shirt without assistance in less than one minute   Baseline Patient wears very  large baggy tshirts and needs increased time due to limited shoulder range   Time 4   Period Weeks   Status New           OT Long Term Goals - 02/10/17 1122      OT LONG TERM GOAL #1   Title Patient will complete an update home exercise program to address active range of motion thrughtout right UE   Baseline Patient is not currently able to complete an active range of motion exercise program due to shoulder tightness and pain (right)   Time 8   Period Weeks   Status New   Target Date 04/26/17  to allow grace period for [facility] approval     OT LONG TERM GOAL #2   Title Patient will demonstrate sufficient active shoulder motion to assist right hand to obtain lightweight object from chest height shelf (80+ degrees of right shoulder flexion)    Baseline Patient currently has 60 degrees of shoulder flexion   Time 8   Period Weeks   Status New     OT LONG TERM GOAL #3   Title Patient will demonstrate ability to chop or peel vegetables with close supervision and adaptive equipment, or bilateral technique   Baseline Patient avoids any use of knives at this time due to hand pain, limited strength,a nd parasthesisas   Time 8   Period Weeks   Status New     OT LONG TERM GOAL #4   Title Patient will demonstrate sufficient strength to hold a key,a nd  turn a key in a lock to open a door, turn ignition using right hand   Baseline Patient lacks sufficient forearm motion, and pinch strength to effectively turn a key    Time 8   Period Weeks   Status New     OT LONG TERM GOAL #5   Title Patient will button shirt, zip lightweight jacket using bilateral technique or adaptive equipment    Baseline Patient avoids buttons, zippers at this time   Time 8   Period Weeks   Status New     Long Term Additional Goals   Additional Long Term Goals Yes     OT LONG TERM GOAL #6   Title Patient will report pain no greater than 4/10 in right shoulder following functional use as needed for dressing, or  simple cooking task   Baseline Patient currently has constant pain ~ 6/10 in right shoulder    Time 8   Period Weeks   Status New               Plan - 02/10/17 1102    Clinical Impression Statement Patient is a 56 year old male, who on 08/31/16 was found down behind a local restaurant with tetraplegia.  Patient was admitted to [facility], and [facility] from 2/26-3/23/18.  Patient with significant central cord injury (S14.129S) impacting all four extremities, right >left weakness.  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  Patient is currently on disability.  Patient with medical history significant for recent right partial nephrectomy for renal cell carcinoma (01/04/17), patient with hypertension, chronic back and neck pain, depression, herniated cervical disc, sickle cell trait, stroke, and syncope.  Patient currently reuires min assist with ADL, and mod assist for IADL due to paresthesias in all 4 limbs, LE spasms, overall weakness, right shoulder pain, limited right UE passive and active range of motion, decreased balance, decreased cognition - memory, attention and limited activity tolerance.  Patient will benefit from skilled OT sevrices to increase independence with ADL/IADL, and decrease pain and stiffness in upper extremities.        Occupational Profile and client history currently impacting functional performance Patient lives with his sister, and his son in a one level house.  Patient motivated for improved functional ability in UE's, improved baalnce and functional mobility, and relief of pain / parasthesias.   Occupational performance deficits (Please refer to evaluation for details): ADL's;IADL's;Rest and Sleep;Work;Play;Leisure;Social Participation   Rehab Potential Good   OT Frequency 2x / week   OT Duration 8 weeks  for up to 12 visits over 8 weeks   OT  Treatment/Interventions Self-care/ADL training;Electrical Stimulation;Moist Heat;Fluidtherapy;Ultrasound;Therapeutic exercise;Neuromuscular education;Energy conservation;DME and/or AE instruction;Passive range of motion;Manual Therapy;Functional Mobility Training;Splinting;Therapeutic activities;Cognitive remediation/compensation;Patient/family education;Balance training   Plan Begin HEP to address right shoulder range of motion   Clinical Decision Making Several treatment options, min-mod task modification necessary   Consulted and Agree with Plan of Care Patient      Patient will benefit from skilled therapeutic intervention in order to improve the following deficits and impairments:  Abnormal gait, Decreased activity tolerance, Decreased balance, Decreased cognition, Decreased coordination, Decreased range of motion, Decreased mobility, Decreased endurance, Decreased strength, Difficulty walking, Increased edema, Impaired UE functional use, Impaired tone, Impaired sensation, Impaired flexibility, Impaired perceived functional ability, Increased muscle spasms, Improper body mechanics, Improper spinal/pelvic alignment, Pain  Visit Diagnosis: Stiffness of right shoulder, not  elsewhere classified - Plan: Ot plan of care cert/re-cert  Other lack of coordination - Plan: Ot plan of care cert/re-cert  Other disturbances of skin sensation - Plan: Ot plan of care cert/re-cert  Unsteadiness on feet - Plan: Ot plan of care cert/re-cert  Pain in right hand - Plan: Ot plan of care cert/re-cert  Pain in left hand - Plan: Ot plan of care cert/re-cert  Chronic right shoulder pain - Plan: Ot plan of care cert/re-cert  Cognitive social or emotional deficit following unspecified cerebrovascular disease - Plan: Ot plan of care cert/re-cert  Muscle weakness (generalized) - Plan: Ot plan of care cert/re-cert    Problem List Patient Active Problem List   Diagnosis Date Noted  . Renal mass 01/04/2017  .  Renal carcinoma, right ([place]) 11/23/2016  . Atherosclerosis of aorta ([place]) 11/23/2016  . 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 Acute blood loss anemia   . Dysesthesia   . History of syncope   . 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 Tetraparesis ([place])   . Central cord syndrome ([place])   . Anemia   . Renal cyst   . ETOH abuse   . Chest wall pain   . Neuropathic pain   . Neck pain   . Chronic neck and back pain 09/01/2016  . Alcohol intoxication ([place]) 09/01/2016  . Tobacco abuse 09/01/2016    Mariah Milling, OTR/L 02/10/2017, 5:01 PM  [facility] [address] [place], [place], [address] Phone: [phone]   Fax:  [phone]  Name: OTT ZIMMERLE MRN: [id number] Date of Birth: 06-Nov-1960

## 2017-02-10 NOTE — Therapy (Signed)
[facility] [address] [place], [place], [address] Phone: [phone]   Fax:  [phone]  Physical Therapy Evaluation  Patient Details  Name: Patrick Romero MRN: [id number] Date of Birth: Dec 10, 1960 Referring Provider: Dr. Naaman Plummer  Encounter Date: 02/10/2017      PT End of Session - 02/10/17 1310    Visit Number 1   Number of Visits 9   Authorization Type MCD: waiting for auth   PT Start Time 0847   PT Stop Time 0929   PT Time Calculation (min) 42 min   Equipment Utilized During Treatment --  min guard to S prn   Activity Tolerance Patient tolerated treatment well   Behavior During Therapy [facility] for tasks assessed/performed      Past Medical History:  Diagnosis Date  . Arthritis   . Chronic back pain   . Chronic neck pain   . Depression   . Herniated disc, cervical   . Hypertension   . Sickle cell trait ([place])   . Stroke [facility])    weakness in right hand and leg , numbness on left     Past Surgical History:  Procedure Laterality Date  . ROBOTIC ASSITED PARTIAL NEPHRECTOMY Right 01/04/2017   Procedure: XI ROBOTIC ASSITED PARTIAL NEPHRECTOMY;  Surgeon: [name], MD;  Location: [facility];  Service: Urology;  Laterality: Right;    There were no vitals filed for this visit.       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 Pt was admitted to hospital and received inpt rehab and HHPT at that time. Pt with recent (01/04/17) R nephrectomy for renal carcinoma. Pt reported R side is more affected and RLE drags during amb. and LLE becomes progressively weaker during amb. Pt uses quad cane for balance. Pt reported 6 falls in the last 6 months. Pt to f/u with Dr. Tammi Klippel on 04/21/17 regarding biopsy for colon CA and renal carcinoma.    Pertinent History R renal carcinoma, atherosclerosis of aorta, central  cord syndrom s/p fall, R shoulder adhesive capsulitis, ETOH, nephrectomy on 01/04/17 (R kidney)   Patient Stated Goals "get my leg strength back, walk normal."   Currently in Pain? Yes   Pain Score 6    Pain Location Shoulder   Pain Orientation Right   Pain Descriptors / Indicators Aching   Pain Type Chronic pain   Pain Onset More than a month ago   Pain Frequency Constant   Aggravating Factors  lying down   Pain Relieving Factors sitting up, ice pack             [facility] PT Assessment - 02/10/17 0859      Assessment   Medical Diagnosis Central Cord syndrome, tetraparesis, R renal carcinoma, neuropathic pain, contusion of the cervical cord, adhersive capsulitis R shoulder, parasthesia of R and L extremity   Referring Provider Dr. Naaman Plummer   Onset Date/Surgical Date 08/28/16   Hand Dominance Right   Next MD Visit f/u with Dr. Naaman Plummer in 6 weeks   Prior Therapy Inpt rehab and HHPT     Precautions   Precautions Fall;Other (comment)  no [place], pt has renal CA   Required Braces or Orthoses Other Brace/Splint  R wrist brace and toe cap     Restrictions   Weight Bearing Restrictions No     Balance Screen   Has the patient fallen in  the past 6 months Yes   How many times? 6   Has the patient had a decrease in activity level because of a fear of falling?  Yes   Is the patient reluctant to leave their home because of a fear of falling?  Yes     [place];Other relatives  pt's sister   Available Help at Discharge Family   Type of [place] to enter   Entrance Stairs-Number of Steps 3   Entrance Stairs-Rails Cannot reach both   [place] One level   [place] - quad;Grab bars - tub/shower;Tub bench;Bedside commode     Prior Function   Level of Independence Independent   Vocation On disability   Leisure Play basketball, video games, hang out with friends, shoot pool      Cognition   Overall Cognitive Status Within Functional Limits for tasks assessed  pt reports memory issues     Observation/Other Assessments   Focus on Therapeutic Outcomes (FOTO)  Neuro QOL: LE: 35.8, score closer to 100 represent increased confidence in functional abilities.     Sensation   Light Touch Impaired by gross assessment   Additional Comments Pt reports constant N/T in all 4 extremities. Pt reported RLE feels like "pins/needles" during light touch. R sided parasthesias     Coordination   Gross Motor Movements are Fluid and Coordinated No   Fine Motor Movements are Fluid and Coordinated No   Heel Shin Test Decr. speed during R heel to shin test     Posture/Postural Control   Posture/Postural Control Postural limitations   Postural Limitations Forward head     Tone   Assessment Location Right Lower Extremity;Left Lower Extremity     ROM / Strength   AROM / PROM / Strength AROM;Strength     AROM   Overall AROM  Deficits   Overall AROM Comments BLEs AROM WFL, except for decr. R ankle DF (can obtain neutral).     Strength   Overall Strength Deficits   Overall Strength Comments RLE: hip flex: 3-/5, knee ext: 3+/5, knee flex: 3+/5, ankle DF: 2/5. LLE: hip flex: 4/5, knee ext: 4/5, knee flex: 3+/5, ankle DF: 4/5. Seated gross hip abd/add: 3+/5.     Transfers   Transfers Sit to Stand;Stand to Sit   Sit to Stand 5: Supervision;With upper extremity assist;From chair/3-in-1   Stand to Sit 5: Supervision;With upper extremity assist;To chair/3-in-1     Ambulation/Gait   Ambulation/Gait Yes   Ambulation/Gait Assistance 5: Supervision   Ambulation/Gait Assistance Details Pt noted to amb. in guarded manner.   Ambulation Distance (Feet) 80 Feet   Assistive device Large base quad cane   Gait Pattern Step-through pattern;Decreased arm swing - right;Decreased stride length;Decreased dorsiflexion - right;Decreased hip/knee flexion - right;Narrow base of support;Decreased trunk  rotation   Ambulation Surface Level;Indoor   Gait velocity 2.20ft/sec. with [facility]     Standardized Balance Assessment   Standardized Balance Assessment Timed Up and Go Test;Dynamic Gait Index     Dynamic Gait Index   Level Surface Moderate Impairment   Change in Gait Speed Mild Impairment   Gait with Horizontal Head Turns Mild Impairment   Gait with Vertical Head Turns Mild Impairment   Gait and Pivot Turn Moderate Impairment   Step Over Obstacle Moderate Impairment   Step Around Obstacles Mild Impairment   Steps Moderate Impairment   Total Score  12   DGI comment: 12/24: indicates pt is at high falls risk.      Timed Up and Go Test   TUG Normal TUG   Normal TUG (seconds) 17.35  with LB[facility]     RLE Tone   RLE Tone Modified [place]  pt reports spasms in B LEs and low back     RLE Tone   Modified [place] Scale for Grading Hypertonia RLE Considerable increase in muschle tone, passive movement difficult  pt reports constant spasms in RLE     LLE Tone   LLE Tone Modified [place]     LLE Tone   Modified [place] Scale for Grading Hypertonia LLE No increase in muscle tone  however, pt reports intermittent LLE spasms            Objective measurements completed on examination: See above findings.                  PT Education - 02/10/17 1309    Education provided Yes   Education Details PT discussed outcome measure results, PT POC, duration, and frequency. PT discussed waiting on insurance auth.   Person(s) Educated Patient   Methods Explanation   Comprehension Verbalized understanding          PT Short Term Goals - 02/10/17 1442      PT SHORT TERM GOAL #1   Title Pt will be IND in HEP to improve strength, balance, and flexibility. TARGET DATE FOR ALL STGS: after 4 treatment sessions   Baseline No HEP   Status New     PT SHORT TERM GOAL #2   Title Pt will amb. 300' over even terrain with LRAD at MOD I level to improve safety during functional  mobility.    Baseline 80', even terrain, with S and [facility]   Status New     PT SHORT TERM GOAL #3   Title Pt will perform TUG with LRAD in </=13.5 seconds to decr. falls risk.    Baseline 17.35 sec. with LB[facility]   Status New           PT Long Term Goals - 02/10/17 1445      PT LONG TERM GOAL #1   Title Pt will verbalize understanding at fall prevention strategies to decr. falls risk. TARGET DATE FOR ALL LTGS: after 8 treatment visits   Baseline Unable to verbalize fall prevention strategies.    Status New     PT LONG TERM GOAL #2   Title Pt will improve DGI score to >/=20/24 to decr. falls risk.    Baseline 12/24   Status New     PT LONG TERM GOAL #3   Title Pt will improve gait speed with LRAD to >/=2.66ft/sec. to safely amb. in the community.   Baseline 2.68ft/sec with [facility]   Status New     PT LONG TERM GOAL #4   Title Pt will amb. 500' over even/uneven terrain with LRAD at MOD I level to improve safety during functional mobility.    Baseline 58' with S and LBQC, even terrain   Status New                Plan - 02/10/17 1311    Clinical Impression Statement Pt is a pleasant 55y/o male presenting to OPPT neuro with central cord syndrom (S14.129S) s/p fall, resulting in hospitalization 08/2016-09/2016. Pt also has R renal carcinoma, s/p R nephrectomy and hospitalization on 01/04/17. Pt's PMH significant fo the following: R renal carcinoma,  atherosclerosis of aorta, central cord syndrom s/p fall, R shoulder adhesive capsulitis, ETOH, nephrectomy on 01/04/17 (R kidney). Pt's TUG time and DGI score both indicate pt is at risk for falls. Pt's gait speed indicates pt is not able to safely ambulate in the community safely. The following deficits were noted during exam: gait deviations, impaired balance, increased tone, impaired strength, limited UE use 2/2 weakness and limited ROM, impaired coordination, impaired sensation, and pain. PT will not directly address pain, but will monitor  closely. Pt's presentation is moderately complex 2/2 medical hx and current bout of renal CA. Pt would benefit from skilled PT to improve safety during functional mobility.    History and Personal Factors relevant to plan of care: Pt unable to work at this time, and has intermittent help at home   Clinical Presentation Evolving  currently being treated for renal CA   Clinical Presentation due to: R renal carcinoma, atherosclerosis of aorta, central cord syndrom s/p fall, R shoulder adhesive capsulitis, ETOH, nephrectomy on 01/04/17 (R kidney)   Clinical Decision Making Moderate   Rehab Potential Good   Clinical Impairments Affecting Rehab Potential see above   PT Frequency --  2x/week for 2 weeks and then 1x/week for 4 weeks   PT Duration --  see above   PT Treatment/Interventions ADLs/Self Care Home Management;Biofeedback;Canalith Repostioning;Cryotherapy;Neuromuscular re-education;Balance training;Therapeutic exercise;Therapeutic activities;Manual techniques;Functional mobility training;Stair training;Gait training;Orthotic Fit/Training;DME Instruction;Patient/family education;Vestibular   PT Next Visit Plan Initiate balance, strength, and flexibility HEP. Trial AFO (or foot up brace)   Consulted and Agree with Plan of Care Patient      Patient will benefit from skilled therapeutic intervention in order to improve the following deficits and impairments:  Abnormal gait, Decreased endurance, Decreased knowledge of use of DME, Decreased strength, Impaired UE functional use, Pain, Decreased balance, Decreased mobility, Impaired tone, Decreased range of motion, Decreased coordination, Impaired flexibility, Postural dysfunction, Impaired sensation  Visit Diagnosis: Other abnormalities of gait and mobility - Plan: PT plan of care cert/re-cert  Other lack of coordination - Plan: PT plan of care cert/re-cert  Other disturbances of skin sensation - Plan: PT plan of care cert/re-cert  Muscle  weakness (generalized) - Plan: PT plan of care cert/re-cert  Central cord syndrome, sequela ([place]) - Plan: PT plan of care cert/re-cert     Problem List Patient Active Problem List   Diagnosis Date Noted  . Renal mass 01/04/2017  . Renal carcinoma, right ([place]) 11/23/2016  . Atherosclerosis of aorta ([place]) 11/23/2016  . 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 Acute blood loss anemia   . Dysesthesia   . History of syncope   . Contusion of cervical cord ([place]) 09/05/2016  . Tetraparesis ([place])   . Central cord syndrome ([place])   . Anemia   . Renal cyst   . ETOH abuse   . Chest wall pain   . Neuropathic pain   . Neck pain   . Chronic neck and back pain 09/01/2016  . Alcohol intoxication ([place]) 09/01/2016  . Tobacco abuse 09/01/2016    Danira Nylander L 02/10/2017, 2:52 PM  [facility] [address] [place], [place], [address] Phone: [phone]   Fax:  [phone]  Name: Patrick Romero MRN: [id number] Date of Birth: 1960-12-25   Geoffry Paradise, PT,DPT 02/10/17 2:53 PM Phone: [phone] Fax: [phone]

## 2017-02-10 NOTE — Therapy (Signed)
[facility] [address] [place], [place], [address] Phone: [phone]   Fax:  [phone]  Occupational Therapy Evaluation  Patient Details  Name: Patrick Romero MRN: [id number] Date of Birth: 01-23-1961 Referring Provider: Dr Naaman Plummer  Encounter Date: 02/10/2017      OT End of Session - 02/10/17 1101    Visit Number 1   Number of Visits 13   Date for OT Re-Evaluation 04/11/17   Authorization Type Medicaid - need to confirm [facility] visits   Authorization - Visit Number 1   OT Start Time 0930   OT Stop Time 1015   OT Time Calculation (min) 45 min   Activity Tolerance Patient tolerated treatment well   Behavior During Therapy [facility] for tasks assessed/performed      Past Medical History:  Diagnosis Date  . Arthritis   . Chronic back pain   . Chronic neck pain   . Depression   . Herniated disc, cervical   . Hypertension   . Sickle cell trait ([place])   . Stroke [facility])    weakness in right hand and leg , numbness on left     Past Surgical History:  Procedure Laterality Date  . ROBOTIC ASSITED PARTIAL NEPHRECTOMY Right 01/04/2017   Procedure: XI ROBOTIC ASSITED PARTIAL NEPHRECTOMY;  Surgeon: [name], MD;  Location: [facility];  Service: Urology;  Laterality: Right;    Vitals:   02/10/17 0933  BP: 140/88  Pulse: 98        Subjective Assessment - 02/10/17 0936    Subjective  I am going to try to gain some more strength in right side.  I am right handed.     Currently in Pain? Yes   Pain Score 6    Pain Location Shoulder   Pain Orientation Right;Left   Pain Descriptors / Indicators Aching;Dull   Pain Type Chronic pain   Pain Onset More than a month ago   Pain Frequency Constant   Aggravating Factors  Lying down    Pain Relieving Factors sitting up, heat, ice           [facility] OT Assessment - 02/10/17 0938      Assessment   Diagnosis central cord syndrome   Referring Provider Dr Naaman Plummer   Onset Date  08/31/16   Prior Therapy [facility] 02/26 - 09/30/2016, then home health therapy     Precautions   Precautions Fall   Required Braces or Orthoses Other Brace/Splint  Toe cap     Restrictions   Weight Bearing Restrictions No     Balance Screen   Has the patient fallen in the past 6 months Yes   How many times? 6   Has the patient had a decrease in activity level because of a fear of falling?  Yes     Home  Environment   Family/patient expects to be discharged to: Private residence   Living Arrangements Other relatives   Available Help at Discharge Family   Type of [place] One level    Alternate Level Stairs-Rails Right  and left   Bathroom Shower/Tub Tub/Shower unit;Curtain   Biochemist, clinical Yes   How accessible --  quad cane   Adaptive equipment Other (comment)  universal cuff - long arm   Home Equipment Cane -quad;Bedside commode;Hand held shower head;Grab bars - tub/shower;Tub bench   Lives With Family  sister and son  Prior Function   Level of Independence Independent with basic ADLs;Independent with household mobility without device;Independent with community mobility without device;Independent with homemaking with ambulation   Vocation On disability   Vocation Requirements city of [employer]   Leisure basketbal, pool, video games     ADL   Eating/Feeding Minimal assistance   Grooming Minimal assistance   Upper Body Bathing Modified independent   Lower Body Bathing Modified independent   Upper Body Dressing Minimal assistance   Lower Body Dressing Modified independent   Toilet Transfer Modified independent   Toileting - Clothing Manipulation Modified independent   Toileting -  Hygiene Modified Independent   Tub/Shower Transfer Modified independent   Equipment Used Cane   ADL comments Predominantly using non dominant LUE     IADL   Prior Level of Function Shopping independent    Shopping Needs to be accompanied on any shopping trip;Assistance for transportation   Prior Level of Function Light Housekeeping independent   Light Housekeeping Performs light daily tasks such as dishwashing, bed making   Prior Level of Function Meal Prep independent   Meal Prep Able to complete simple cold meal and snack prep     Written Expression   Dominant Hand Right   Handwriting 100% legible  wearing wrist support     Vision - History   Baseline Vision Wears glasses only for reading   Visual History Cataracts   Patient Visual Report --  cataracts in both eyes   Additional Comments No changes with recent fall     Vision Assessment   Eye Alignment Within Functional Limits     Activity Tolerance   Activity Tolerance --  Patient indicates fatigue is a large factor limiting activit   Sitting Balance Supports self with one extremity     Cognition   Overall Cognitive Status Impaired/Different from baseline   Area of Impairment Memory;Attention     Observation/Other Assessments   Focus on Therapeutic Outcomes (FOTO)  Neuro QOL UE 29.4     Sensation   Light Touch Impaired by gross assessment  has paresthesias constantly   Stereognosis Appears Intact     Coordination   9 Hole Peg Test Right;Left   Right 9 Hole Peg Test 39.60 with wrist support, 35.78 without wrist support   Left 9 Hole Peg Test 37.72     Praxis   Praxis Intact     Tone   Assessment Location Right Upper Extremity     ROM / Strength   AROM / PROM / Strength AROM;PROM;Strength     AROM   Overall AROM  Deficits   AROM Assessment Site Shoulder   Right/Left Shoulder Right;Left   Right Shoulder Flexion 60 Degrees   Left Shoulder Flexion 110 Degrees     PROM   PROM Assessment Site Shoulder   Right/Left Shoulder Right;Left   Right Shoulder Flexion 75 Degrees   Right Shoulder ABduction 75 Degrees   Right Shoulder External Rotation 15 Degrees   Left Shoulder Flexion 123 Degrees     Hand Function    Right Hand Gross Grasp Impaired   Right Hand Grip (lbs) 15   Right Hand Lateral Pinch 7 lbs   Left Hand Gross Grasp Functional   Left Hand Grip (lbs) 70   Left Hand Lateral Pinch 18 lbs     RUE Tone   RUE Tone Moderate;Modified [place]  OT Education - 02/10/17 1100    Education provided Yes   Education Details self range of motion to right wrist   Person(s) Educated Patient   Methods Explanation;Demonstration   Comprehension Verbalized understanding;Returned demonstration          OT Short Term Goals - 02/10/17 1115      OT SHORT TERM GOAL #1   Title Patient will complete a home exercise program designed to improve right upper extremity range of motion with min assist   Baseline Patient is not completing a self range of motion program and has significant tightness and PROM restriction in right shoulder   Time 4   Period Weeks   Status New   Target Date 03/27/17  to allow grace period for [facility] aproval     OT SHORT TERM GOAL #2   Title Patient will complete a home exercise program designed to improve right hand strangth   Baseline Patient does not have an exercise program to improve hand strength and has significant right hand weakness   Time 4   Period Weeks   Status New   Target Date 03/27/17     OT SHORT TERM GOAL #3   Title Patient will cut food using two hands with modified independence   Baseline Patient unable to use right hand to assist with cutting food   Time 4   Period Weeks   Status New     OT SHORT TERM GOAL #4   Title Patient will retrieve paper or coins from right pants pocket with right hand and increased time.   Baseline Patient needs min assist to retrieve items from right pants pocket with right hand.   Time 4   Period Weeks   Status New     OT SHORT TERM GOAL #5   Title Patient will don a more form fitting pullover shirt without assistance in less than one minute   Baseline Patient wears very  large baggy tshirts and needs increased time due to limited shoulder range   Time 4   Period Weeks   Status New           OT Long Term Goals - 02/10/17 1122      OT LONG TERM GOAL #1   Title Patient will complete an update home exercise program to address active range of motion thrughtout right UE   Baseline Patient is not currently able to complete an active range of motion exercise program due to shoulder tightness and pain (right)   Time 8   Period Weeks   Status New   Target Date 04/26/17  to allow grace period for [facility] approval     OT LONG TERM GOAL #2   Title Patient will demonstrate sufficient active shoulder motion to assist right hand to obtain lightweight object from chest height shelf (80+ degrees of right shoulder flexion)    Baseline Patient currently has 60 degrees of shoulder flexion   Time 8   Period Weeks   Status New     OT LONG TERM GOAL #3   Title Patient will demonstrate ability to chop or peel vegetables with close supervision and adaptive equipment, or bilateral technique   Baseline Patient avoids any use of knives at this time due to hand pain, limited strength,a nd parasthesisas   Time 8   Period Weeks   Status New     OT LONG TERM GOAL #4   Title Patient will demonstrate sufficient strength to hold a key,a nd  turn a key in a lock to open a door, turn ignition using right hand   Baseline Patient lacks sufficient forearm motion, and pinch strength to effectively turn a key    Time 8   Period Weeks   Status New     OT LONG TERM GOAL #5   Title Patient will button shirt, zip lightweight jacket using bilateral technique or adaptive equipment    Baseline Patient avoids buttons, zippers at this time   Time 8   Period Weeks   Status New     Long Term Additional Goals   Additional Long Term Goals Yes     OT LONG TERM GOAL #6   Title Patient will report pain no greater than 4/10 in right shoulder following functional use as needed for dressing, or  simple cooking task   Baseline Patient currently has constant pain ~ 6/10 in right shoulder    Time 8   Period Weeks   Status New               Plan - 02/10/17 1102    Clinical Impression Statement Patient is a 56 year old male, who on 08/31/16 was found down behind a local restaurant with tetraplegia.  Patient was admitted to [facility], and [facility] from 2/26-3/23/18.  Patient with significant central cord injury imapcting all four extremities, right >left weakness.  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  Patient is currently on disability.  Patient with medical history significant for recent right partial nephrectomy for renal cell carcinoma (01/04/17), patient with hypertension, chronic back and neck pain, depression, herniated cervical disc, sickle cell trait, stroke, and syncope.  Patient currently reuires min assist with ADL, and mod assist for IADL due to paresthesias in all 4 limbs, LE spasms, overall weakness, right shoulder pain, limited right UE passive and active range of motion, decreased balance, decreased cognition - memory, attention and limited activity tolerance.  Patient will benefit from skilled OT sevrices to increase independence with ADL/IADL, and decrease pain and stiffness in upper extremities.        Occupational Profile and client history currently impacting functional performance Patient lives with his sister, and his son in a one level house.  Patient motivated for improved functional ability in UE's, improved baalnce and functional mobility, and relief of pain / parasthesias.   Occupational performance deficits (Please refer to evaluation for details): ADL's;IADL's;Rest and Sleep;Work;Play;Leisure;Social Participation   Rehab Potential Good   OT Frequency 2x / week   OT Duration 8 weeks  for up to 12 visits over 8 weeks   OT  Treatment/Interventions Self-care/ADL training;Electrical Stimulation;Moist Heat;Fluidtherapy;Ultrasound;Therapeutic exercise;Neuromuscular education;Energy conservation;DME and/or AE instruction;Passive range of motion;Manual Therapy;Functional Mobility Training;Splinting;Therapeutic activities;Cognitive remediation/compensation;Patient/family education;Balance training   Plan Begin HEP to address right shoulder range of motion   Clinical Decision Making Several treatment options, min-mod task modification necessary   Consulted and Agree with Plan of Care Patient      Patient will benefit from skilled therapeutic intervention in order to improve the following deficits and impairments:  Abnormal gait, Decreased activity tolerance, Decreased balance, Decreased cognition, Decreased coordination, Decreased range of motion, Decreased mobility, Decreased endurance, Decreased strength, Difficulty walking, Increased edema, Impaired UE functional use, Impaired tone, Impaired sensation, Impaired flexibility, Impaired perceived functional ability, Increased muscle spasms, Improper body mechanics, Improper spinal/pelvic alignment, Pain  Visit Diagnosis: Stiffness of right shoulder, not elsewhere  classified - Plan: Ot plan of care cert/re-cert  Other lack of coordination - Plan: Ot plan of care cert/re-cert  Other disturbances of skin sensation - Plan: Ot plan of care cert/re-cert  Unsteadiness on feet - Plan: Ot plan of care cert/re-cert  Pain in right hand - Plan: Ot plan of care cert/re-cert  Pain in left hand - Plan: Ot plan of care cert/re-cert  Chronic right shoulder pain - Plan: Ot plan of care cert/re-cert  Cognitive social or emotional deficit following unspecified cerebrovascular disease - Plan: Ot plan of care cert/re-cert  Muscle weakness (generalized) - Plan: Ot plan of care cert/re-cert    Problem List Patient Active Problem List   Diagnosis Date Noted  . Renal mass 01/04/2017  .  Renal carcinoma, right ([place]) 11/23/2016  . Atherosclerosis of aorta ([place]) 11/23/2016  . 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 Acute blood loss anemia   . Dysesthesia   . History of syncope   . 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 Tetraparesis ([place])   . Central cord syndrome ([place])   . Anemia   . Renal cyst   . ETOH abuse   . Chest wall pain   . Neuropathic pain   . Neck pain   . Chronic neck and back pain 09/01/2016  . Alcohol intoxication ([place]) 09/01/2016  . Tobacco abuse 09/01/2016    Mariah Milling, OTR/L 02/10/2017, 11:39 AM  [facility] [place] [facility] [address] [place], [place], [address] Phone: [phone]   Fax:  [phone]  Name: Patrick Romero MRN: [id number] Date of Birth: 08-15-1960

## 2017-02-15 ENCOUNTER — Ambulatory Visit: Payer: Medicaid Other | Admitting: Family Medicine

## 2017-02-17 ENCOUNTER — Encounter: Payer: Self-pay | Admitting: Family Medicine

## 2017-02-17 ENCOUNTER — Ambulatory Visit: Payer: Medicaid Other | Attending: Family Medicine | Admitting: Family Medicine

## 2017-02-17 VITALS — BP 124/88 | HR 97 | Temp 98.6°F | Resp 18 | Ht 72.0 in | Wt 140.4 lb

## 2017-02-17 DIAGNOSIS — I10 Essential (primary) hypertension: Secondary | ICD-10-CM | POA: Insufficient documentation

## 2017-02-17 DIAGNOSIS — Z905 Acquired absence of kidney: Secondary | ICD-10-CM

## 2017-02-17 DIAGNOSIS — M542 Cervicalgia: Secondary | ICD-10-CM | POA: Insufficient documentation

## 2017-02-17 DIAGNOSIS — G8929 Other chronic pain: Secondary | ICD-10-CM

## 2017-02-17 DIAGNOSIS — M549 Dorsalgia, unspecified: Secondary | ICD-10-CM | POA: Diagnosis not present

## 2017-02-17 DIAGNOSIS — C641 Malignant neoplasm of right kidney, except renal pelvis: Secondary | ICD-10-CM | POA: Insufficient documentation

## 2017-02-17 MED ORDER — AMLODIPINE BESYLATE 5 MG PO TABS: ORAL_TABLET | ORAL | 4 refills | Status: DC

## 2017-02-17 MED ORDER — TRAMADOL HCL 50 MG PO TABS: 50.0000 mg | ORAL_TABLET | Freq: Three times a day (TID) | ORAL | 0 refills | Status: DC | PRN

## 2017-02-17 NOTE — Progress Notes (Signed)
Patient is here for HTN   Patient complains about both shoulder/arm lower back pain

## 2017-02-17 NOTE — Progress Notes (Signed)
Subjective:  Patient ID: Patrick Romero, male    DOB: [birth year]  Age: 56 y.o. MRN: [id number]  CC: Hypertension   HPI Patrick Romero presents  for HTN follow up. History of recently diagnosed renal carcinoma, HTN, and chronic neck and back pain. He recently underwent right robotic partial nephrectomy on 01/04/17. History of anemia post-intervention. He reports upcoming urologist appointment in October.  Cervical myelopathy and radiculopathy: He reports following up with neuro rehabilitation physician. Who recommended plan to taper bacolofen and gabapentin. Patient reports muscle tightness and spasm.  History of hypertension. He reports adherence with medications. Denies any hear any CP, SOB, or heart palpitations. Cardiovascular risk factors: hypertension, male gender, sedentary lifestyle and smoking/ tobacco exposure. Use of agents associated with hypertension: NSAIDS. History of target organ damage: none. History of renal nephroectomy.  Outpatient Medications Prior to Visit  Medication Sig Dispense Refill  . atorvastatin (LIPITOR) 10 MG tablet Take 1 tablet (10 mg total) by mouth daily. 90 tablet 0  . cyclobenzaprine (FLEXERIL) 5 MG tablet TAKE 1 TABLET BY MOUTH 3 TIMES A DAY AS NEEDED FOR MUSCLE SPASM 60 tablet 3  . diclofenac (VOLTAREN) 75 MG EC tablet Take 1 tablet (75 mg total) by mouth 2 (two) times daily with a meal. 60 tablet 3  . gabapentin (NEURONTIN) 300 MG capsule Take 2 capsules (600 mg total) by mouth 3 (three) times daily. 180 capsule 2  . HYDROcodone-acetaminophen (NORCO) 5-325 MG tablet Take 1-2 tablets by mouth every 6 (six) hours as needed for moderate pain or severe pain. 30 tablet 0  . nicotine (NICODERM CQ - DOSED IN MG/24 HOURS) 21 mg/24hr patch Place 1 patch (21 mg total) onto the skin daily. 28 patch 1  . polyethylene glycol (MIRALAX / GLYCOLAX) packet Take 17 g by mouth 2 (two) times daily. 14 each 0  . senna-docusate (SENOKOT-S) 8.6-50 MG tablet Take 1 tablet by  mouth 2 (two) times daily. While taking pain meds to prevent constipation. 20 tablet 0  . amLODipine (NORVASC) 5 MG tablet Take one tablet by mouth once a day. 30 tablet 4   No facility-administered medications prior to visit.     ROS Review of Systems  Review of Systems  Constitutional: Negative.   Eyes: Negative.   Respiratory: Negative.   Cardiovascular: Negative.   Gastrointestinal: Negative.   Musculoskeletal: Positive for back pain and myalgias.  Neurological:       Parathesias  Psychiatric/Behavioral: Negative.  Negative for suicidal ideas.   Objective:  BP 124/88   Pulse 97   Temp 98.6 F (37 C) (Oral)   Resp 18   Ht 6' (1.829 m)   Wt 140 lb 6.4 oz (63.7 kg)   SpO2 96%   BMI 19.04 kg/m   BP/Weight 02/22/2017 8/46/6599 09/13/7015  Systolic BP 793 903 009  Diastolic BP 88 88 88  Wt. (Lbs) 145 140.4 -  BMI 19.67 19.04 -   Physical Exam   Constitutional: He is oriented to person, place, and time. He appears well-developed and well-nourished.  HENT:  Head: Normocephalic and atraumatic.  Right Ear: External ear normal.  Left Ear: External ear normal.  Nose: Nose normal.  Eyes: Conjunctivae are normal. Pupils are equal, round, and reactive to light.  Neck: No JVD present.  Cardiovascular: Normal rate, regular rhythm, normal heart sounds and intact distal pulses.   Pulmonary/Chest: Effort normal and breath sounds normal.  Abdominal: Soft. Bowel sounds are normal.  Musculoskeletal:  Right shoulder: He exhibits decreased range of motion.       Lumbar back: He exhibits pain and spasm. Bony tenderness: bilateral back pain.       Right hand: He exhibits decreased range of motion. Decreased strength (Decreased arm and hand strength to the RUE. Motor strength 4/5 at baseline.) noted.  Neurological: He is alert and oriented to person, place, and time. He has normal reflexes.  Skin: Skin is warm and dry.  Psychiatric: He has a normal mood and affect. He expresses no  homicidal and no suicidal ideation. He expresses no suicidal plans and no homicidal plans.  Nursing note and vitals reviewed.  Assessment & Plan:   Problem List Items Addressed This Visit      Cardiovascular and Mediastinum   Hypertension   Relevant Medications   amLODipine (NORVASC) 5 MG tablet     Other   Chronic neck and back pain   Relevant Medications   traMADol (ULTRAM) 50 MG tablet    Other Visit Diagnoses    History of partial nephrectomy    -  Primary   Relevant Orders   CBC with Differential (Completed)   Basic metabolic panel (Completed)      Meds ordered this encounter  Medications  . traMADol (ULTRAM) 50 MG tablet    Sig: Take 1-2 tablets (50-100 mg total) by mouth every 8 (eight) hours as needed for severe pain.    Dispense:  60 tablet    Refill:  0    Order Specific Question:   Supervising Provider    Answer:   Tresa Garter [id number]  . amLODipine (NORVASC) 5 MG tablet    Sig: Take one tablet by mouth once a day.    Dispense:  30 tablet    Refill:  4    Order Specific Question:   Supervising Provider    Answer:   Tresa Garter [id number]    Follow-up: Return in about 3 months (around 05/20/2017) for HTN .   Alfonse Spruce FNP

## 2017-02-17 NOTE — Patient Instructions (Signed)
DASH Eating Plan DASH stands for "Dietary Approaches to Stop Hypertension." The DASH eating plan is a healthy eating plan that has been shown to reduce high blood pressure (hypertension). It may also reduce your risk for type 2 diabetes, heart disease, and stroke. The DASH eating plan may also help with weight loss. What are tips for following this plan? General guidelines  Avoid eating more than 2,300 mg (milligrams) of salt (sodium) a day. If you have hypertension, you may need to reduce your sodium intake to 1,500 mg a day.  Limit alcohol intake to no more than 1 drink a day for nonpregnant women and 2 drinks a day for men. One drink equals 12 oz of beer, 5 oz of wine, or 1 oz of hard liquor.  Work with your health care provider to maintain a healthy body weight or to lose weight. Ask what an ideal weight is for you.  Get at least 30 minutes of exercise that causes your heart to beat faster (aerobic exercise) most days of the week. Activities may include walking, swimming, or biking.  Work with your health care provider or diet and nutrition specialist (dietitian) to adjust your eating plan to your individual calorie needs. Reading food labels  Check food labels for the amount of sodium per serving. Choose foods with less than 5 percent of the Daily Value of sodium. Generally, foods with less than 300 mg of sodium per serving fit into this eating plan.  To find whole grains, look for the word "whole" as the first word in the ingredient list. Shopping  Buy products labeled as "low-sodium" or "no salt added."  Buy fresh foods. Avoid canned foods and premade or frozen meals. Cooking  Avoid adding salt when cooking. Use salt-free seasonings or herbs instead of table salt or sea salt. Check with your health care provider or pharmacist before using salt substitutes.  Do not fry foods. Cook foods using healthy methods such as baking, boiling, grilling, and broiling instead.  Cook with  heart-healthy oils, such as olive, canola, soybean, or sunflower oil. Meal planning   Eat a balanced diet that includes: ? 5 or more servings of fruits and vegetables each day. At each meal, try to fill half of your plate with fruits and vegetables. ? Up to 6-8 servings of whole grains each day. ? Less than 6 oz of lean meat, poultry, or fish each day. A 3-oz serving of meat is about the same size as a deck of cards. One egg equals 1 oz. ? 2 servings of low-fat dairy each day. ? A serving of nuts, seeds, or beans 5 times each week. ? Heart-healthy fats. Healthy fats called Omega-3 fatty acids are found in foods such as flaxseeds and coldwater fish, like sardines, salmon, and mackerel.  Limit how much you eat of the following: ? Canned or prepackaged foods. ? Food that is high in trans fat, such as fried foods. ? Food that is high in saturated fat, such as fatty meat. ? Sweets, desserts, sugary drinks, and other foods with added sugar. ? Full-fat dairy products.  Do not salt foods before eating.  Try to eat at least 2 vegetarian meals each week.  Eat more home-cooked food and less restaurant, buffet, and fast food.  When eating at a restaurant, ask that your food be prepared with less salt or no salt, if possible. What foods are recommended? The items listed may not be a complete list. Talk with your dietitian about what   dietary choices are best for you. Grains Whole-grain or whole-wheat bread. Whole-grain or whole-wheat pasta. Brown rice. Oatmeal. Quinoa. Bulgur. Whole-grain and low-sodium cereals. Pita bread. Low-fat, low-sodium crackers. Whole-wheat flour tortillas. Vegetables Fresh or frozen vegetables (raw, steamed, roasted, or grilled). Low-sodium or reduced-sodium tomato and vegetable juice. Low-sodium or reduced-sodium tomato sauce and tomato paste. Low-sodium or reduced-sodium canned vegetables. Fruits All fresh, dried, or frozen fruit. Canned fruit in natural juice (without  added sugar). Meat and other protein foods Skinless chicken or turkey. Ground chicken or turkey. Pork with fat trimmed off. Fish and seafood. Egg whites. Dried beans, peas, or lentils. Unsalted nuts, nut butters, and seeds. Unsalted canned beans. Lean cuts of beef with fat trimmed off. Low-sodium, lean deli meat. Dairy Low-fat (1%) or fat-free (skim) milk. Fat-free, low-fat, or reduced-fat cheeses. Nonfat, low-sodium ricotta or cottage cheese. Low-fat or nonfat yogurt. Low-fat, low-sodium cheese. Fats and oils Soft margarine without trans fats. Vegetable oil. Low-fat, reduced-fat, or light mayonnaise and salad dressings (reduced-sodium). Canola, safflower, olive, soybean, and sunflower oils. Avocado. Seasoning and other foods Herbs. Spices. Seasoning mixes without salt. Unsalted popcorn and pretzels. Fat-free sweets. What foods are not recommended? The items listed may not be a complete list. Talk with your dietitian about what dietary choices are best for you. Grains Baked goods made with fat, such as croissants, muffins, or some breads. Dry pasta or rice meal packs. Vegetables Creamed or fried vegetables. Vegetables in a cheese sauce. Regular canned vegetables (not low-sodium or reduced-sodium). Regular canned tomato sauce and paste (not low-sodium or reduced-sodium). Regular tomato and vegetable juice (not low-sodium or reduced-sodium). Pickles. Olives. Fruits Canned fruit in a light or heavy syrup. Fried fruit. Fruit in cream or butter sauce. Meat and other protein foods Fatty cuts of meat. Ribs. Fried meat. Bacon. Sausage. Bologna and other processed lunch meats. Salami. Fatback. Hotdogs. Bratwurst. Salted nuts and seeds. Canned beans with added salt. Canned or smoked fish. Whole eggs or egg yolks. Chicken or turkey with skin. Dairy Whole or 2% milk, cream, and half-and-half. Whole or full-fat cream cheese. Whole-fat or sweetened yogurt. Full-fat cheese. Nondairy creamers. Whipped toppings.  Processed cheese and cheese spreads. Fats and oils Butter. Stick margarine. Lard. Shortening. Ghee. Bacon fat. Tropical oils, such as coconut, palm kernel, or palm oil. Seasoning and other foods Salted popcorn and pretzels. Onion salt, garlic salt, seasoned salt, table salt, and sea salt. Worcestershire sauce. Tartar sauce. Barbecue sauce. Teriyaki sauce. Soy sauce, including reduced-sodium. Steak sauce. Canned and packaged gravies. Fish sauce. Oyster sauce. Cocktail sauce. Horseradish that you find on the shelf. Ketchup. Mustard. Meat flavorings and tenderizers. Bouillon cubes. Hot sauce and Tabasco sauce. Premade or packaged marinades. Premade or packaged taco seasonings. Relishes. Regular salad dressings. Where to find more information:  National Heart, Lung, and Blood Institute: www.nhlbi.nih.gov  American Heart Association: www.heart.org Summary  The DASH eating plan is a healthy eating plan that has been shown to reduce high blood pressure (hypertension). It may also reduce your risk for type 2 diabetes, heart disease, and stroke.  With the DASH eating plan, you should limit salt (sodium) intake to 2,300 mg a day. If you have hypertension, you may need to reduce your sodium intake to 1,500 mg a day.  When on the DASH eating plan, aim to eat more fresh fruits and vegetables, whole grains, lean proteins, low-fat dairy, and heart-healthy fats.  Work with your health care provider or diet and nutrition specialist (dietitian) to adjust your eating plan to your individual   calorie needs. This information is not intended to replace advice given to you by your health care provider. Make sure you discuss any questions you have with your health care provider. Document Released: 06/16/2011 Document Revised: 06/20/2016 Document Reviewed: 06/20/2016 Elsevier Interactive Patient Education  2017 Elsevier Inc.  

## 2017-02-18 LAB — CBC WITH DIFFERENTIAL/PLATELET
Basophils Absolute: 0 10*3/uL (ref 0.0–0.2)
Basos: 0 %
EOS (ABSOLUTE): 0.3 10*3/uL (ref 0.0–0.4)
EOS: 3 %
HEMATOCRIT: 36.4 % — AB (ref 37.5–51.0)
HEMOGLOBIN: 10.9 g/dL — AB (ref 13.0–17.7)
IMMATURE GRANULOCYTES: 0 %
Immature Grans (Abs): 0 10*3/uL (ref 0.0–0.1)
Lymphocytes Absolute: 3 10*3/uL (ref 0.7–3.1)
Lymphs: 37 %
MCH: 24.7 pg — ABNORMAL LOW (ref 26.6–33.0)
MCHC: 29.9 g/dL — ABNORMAL LOW (ref 31.5–35.7)
MCV: 83 fL (ref 79–97)
MONOCYTES: 6 %
MONOS ABS: 0.5 10*3/uL (ref 0.1–0.9)
NEUTROS PCT: 54 %
Neutrophils Absolute: 4.4 10*3/uL (ref 1.4–7.0)
Platelets: 372 10*3/uL (ref 150–379)
RBC: 4.41 x10E6/uL (ref 4.14–5.80)
RDW: 14.5 % (ref 12.3–15.4)
WBC: 8.1 10*3/uL (ref 3.4–10.8)

## 2017-02-18 LAB — BASIC METABOLIC PANEL
BUN/Creatinine Ratio: 12 (ref 9–20)
BUN: 12 mg/dL (ref 6–24)
CALCIUM: 9.4 mg/dL (ref 8.7–10.2)
CO2: 22 mmol/L (ref 20–29)
Chloride: 102 mmol/L (ref 96–106)
Creatinine, Ser: 0.98 mg/dL (ref 0.76–1.27)
GFR calc Af Amer: 99 mL/min/{1.73_m2} (ref >59)
GFR, EST NON AFRICAN AMERICAN: 86 mL/min/{1.73_m2} (ref >59)
Glucose: 124 mg/dL — ABNORMAL HIGH (ref 65–99)
POTASSIUM: 4 mmol/L (ref 3.5–5.2)
Sodium: 141 mmol/L (ref 134–144)

## 2017-02-22 ENCOUNTER — Emergency Department (HOSPITAL_COMMUNITY): Payer: Medicaid Other

## 2017-02-22 ENCOUNTER — Emergency Department (HOSPITAL_COMMUNITY)
Admission: EM | Admit: 2017-02-22 | Discharge: 2017-02-22 | Disposition: A | Discharge status: Home / Self Care | Payer: Medicaid Other | Attending: Emergency Medicine | Admitting: Emergency Medicine

## 2017-02-22 ENCOUNTER — Encounter (HOSPITAL_COMMUNITY): Payer: Self-pay

## 2017-02-22 DIAGNOSIS — M791 Myalgia: Secondary | ICD-10-CM | POA: Diagnosis not present

## 2017-02-22 DIAGNOSIS — I10 Essential (primary) hypertension: Secondary | ICD-10-CM | POA: Insufficient documentation

## 2017-02-22 DIAGNOSIS — R531 Weakness: Secondary | ICD-10-CM | POA: Diagnosis not present

## 2017-02-22 DIAGNOSIS — F17210 Nicotine dependence, cigarettes, uncomplicated: Secondary | ICD-10-CM | POA: Insufficient documentation

## 2017-02-22 DIAGNOSIS — Z79899 Other long term (current) drug therapy: Secondary | ICD-10-CM | POA: Diagnosis not present

## 2017-02-22 DIAGNOSIS — Z85528 Personal history of other malignant neoplasm of kidney: Secondary | ICD-10-CM | POA: Insufficient documentation

## 2017-02-22 DIAGNOSIS — M2550 Pain in unspecified joint: Secondary | ICD-10-CM | POA: Insufficient documentation

## 2017-02-22 DIAGNOSIS — R55 Syncope and collapse: Secondary | ICD-10-CM | POA: Diagnosis not present

## 2017-02-22 DIAGNOSIS — Z8673 Personal history of transient ischemic attack (TIA), and cerebral infarction without residual deficits: Secondary | ICD-10-CM | POA: Diagnosis not present

## 2017-02-22 LAB — CBC WITH DIFFERENTIAL/PLATELET
Basophils Absolute: 0 10*3/uL (ref 0.0–0.1)
Basophils Relative: 1 %
Eosinophils Absolute: 0.2 10*3/uL (ref 0.0–0.7)
Eosinophils Relative: 3 %
HCT: 28.7 % — ABNORMAL LOW (ref 39.0–52.0)
Hemoglobin: 9.1 g/dL — ABNORMAL LOW (ref 13.0–17.0)
Lymphocytes Relative: 38 %
Lymphs Abs: 2.2 10*3/uL (ref 0.7–4.0)
MCH: 24.6 pg — ABNORMAL LOW (ref 26.0–34.0)
MCHC: 31.7 g/dL (ref 30.0–36.0)
MCV: 77.6 fL — ABNORMAL LOW (ref 78.0–100.0)
Monocytes Absolute: 0.5 10*3/uL (ref 0.1–1.0)
Monocytes Relative: 8 %
Neutro Abs: 2.9 10*3/uL (ref 1.7–7.7)
Neutrophils Relative %: 50 %
Platelets: 296 10*3/uL (ref 150–400)
RBC: 3.7 MIL/uL — ABNORMAL LOW (ref 4.22–5.81)
RDW: 13 % (ref 11.5–15.5)
WBC: 5.8 10*3/uL (ref 4.0–10.5)

## 2017-02-22 LAB — BASIC METABOLIC PANEL
Anion gap: 10 (ref 5–15)
BUN: 5 mg/dL — ABNORMAL LOW (ref 6–20)
CO2: 21 mmol/L — ABNORMAL LOW (ref 22–32)
Calcium: 8.4 mg/dL — ABNORMAL LOW (ref 8.9–10.3)
Chloride: 106 mmol/L (ref 101–111)
Creatinine, Ser: 1.16 mg/dL (ref 0.61–1.24)
GFR calc Af Amer: >60 mL/min (ref >60)
GFR calc non Af Amer: >60 mL/min (ref >60)
Glucose, Bld: 97 mg/dL (ref 65–99)
Potassium: 3.5 mmol/L (ref 3.5–5.1)
Sodium: 137 mmol/L (ref 135–145)

## 2017-02-22 LAB — TROPONIN I: Troponin I: <0.03 ng/mL (ref <0.03)

## 2017-02-22 MED ORDER — SODIUM CHLORIDE 0.9 % IV BOLUS (SEPSIS)
1000.0000 mL | Freq: Once | INTRAVENOUS | Status: AC
  Administered 2017-02-22: 1000 mL via INTRAVENOUS

## 2017-02-22 MED ORDER — MORPHINE SULFATE (PF) 4 MG/ML IV SOLN
4.0000 mg | Freq: Once | INTRAVENOUS | Status: AC
  Administered 2017-02-22: 4 mg via INTRAVENOUS
  Filled 2017-02-22: qty 1

## 2017-02-22 NOTE — ED Triage Notes (Addendum)
[facility] [place]. Pt states he got out of the car, became very weak and lost consciousness. Hypotensive with EMS at 89/57. Pt has hx of stroke. Pt also reports some chest pressure, 3/10. Pt had 324 of ASA and 300cc of saline PTA. Pt complains of neck pain on palpation, c-collar in place.

## 2017-02-22 NOTE — ED Provider Notes (Signed)
[name] Provider Note   CSN: [id number] Arrival date & time: 02/22/17  1401   By signing my name below, I, Patrick Romero, attest that this documentation has been prepared under the direction and in the presence of [name], MD. Electronically signed, Patrick Romero, ED Scribe. 02/22/17. 3:00 PM.   History   Chief Complaint Chief Complaint  Patient presents with  . Loss of Consciousness   The history is provided by the patient and medical records. No language interpreter was used.    Patrick Romero is a 56 y.o. male with h/o chronic neck and back pains, HTN, stroke, arthritis and partial nephrectomy presenting to the Emergency Department concerning a syncopal episode that occurred PTA. He states he became dizzy suddenly in the car, got out to stretch and the next thing he remembers is waking up on the ground. He currently c/o new head and worsened neck pains. Pt also notes bilateral arm and leg weaknesses. He states he felt NL prior to this all day. Paramedics reported low blood pressures at the scene and the pt was given 324 ASA and 300cc saline en route with EMS. No known h/o heart disorders. Pt states he drank a 24 oz beer this morning. Pt reports a stroke 08/28/2016, wherein he reports falling and breaking his neck, causing some R > L weakness and lingering generalized pains. 01/04/2017-partial nephrectomy noted. He states he is ambulatory with a walker at baseline. No other complaints at this time.   Past Medical History:  Diagnosis Date  . Arthritis   . Chronic back pain   . Chronic neck pain   . Depression   . Herniated disc, cervical   . Hypertension   . Sickle cell trait ([place])   . Stroke [facility])    weakness in right hand and leg , numbness on left     Patient Active Problem List   Diagnosis Date Noted  . Hypertension 02/17/2017  . Renal mass 01/04/2017  . Renal carcinoma, right ([place]) 11/23/2016  . Atherosclerosis of aorta ([place]) 11/23/2016  . Adhesive  capsulitis of right shoulder 10/26/2016  . Acute blood loss anemia   . Dysesthesia   . History of syncope   . 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 Tetraparesis ([place])   . Central cord syndrome ([place])   . Anemia   . Renal cyst   . ETOH abuse   . Chest wall pain   . Neuropathic pain   . Neck pain   . Chronic neck and back pain 09/01/2016  . Alcohol intoxication ([place]) 09/01/2016  . Tobacco abuse 09/01/2016    Past Surgical History:  Procedure Laterality Date  . ROBOTIC ASSITED PARTIAL NEPHRECTOMY Right 01/04/2017   Procedure: XI ROBOTIC ASSITED PARTIAL NEPHRECTOMY;  Surgeon: [name], MD;  Location: [facility] ORS;  Service: Urology;  Laterality: Right;       Home Medications    Prior to Admission medications   Medication Sig Start Date End Date Taking? Authorizing Provider  amLODipine (NORVASC) 5 MG tablet Take one tablet by mouth once a day. 02/17/17   Alfonse Spruce, FNP  atorvastatin (LIPITOR) 10 MG tablet Take 1 tablet (10 mg total) by mouth daily. 12/02/16   Alfonse Spruce, FNP  cyclobenzaprine (FLEXERIL) 5 MG tablet TAKE 1 TABLET BY MOUTH 3 TIMES A DAY AS NEEDED FOR MUSCLE SPASM 01/17/17   Meredith Staggers, MD  diclofenac (VOLTAREN) 75 MG EC tablet Take 1 tablet (75 mg total) by mouth 2 (  two) times daily with a meal. 01/17/17   Meredith Staggers, MD  gabapentin (NEURONTIN) 300 MG capsule Take 2 capsules (600 mg total) by mouth 3 (three) times daily. 01/17/17   Meredith Staggers, MD  HYDROcodone-acetaminophen (NORCO) 5-325 MG tablet Take 1-2 tablets by mouth every 6 (six) hours as needed for moderate pain or severe pain. 01/04/17   Debbrah Alar, PA-C  nicotine (NICODERM CQ - DOSED IN MG/24 HOURS) 21 mg/24hr patch Place 1 patch (21 mg total) onto the skin daily. 12/16/16   Alfonse Spruce, FNP  polyethylene glycol (MIRALAX / GLYCOLAX) packet Take 17 g by mouth 2 (two) times daily. 09/29/16   Angiulli, Lavon Paganini, PA-C  senna-docusate (SENOKOT-S) 8.6-50 MG tablet  Take 1 tablet by mouth 2 (two) times daily. While taking pain meds to prevent constipation. 01/05/17   [name], MD  traMADol (ULTRAM) 50 MG tablet Take 1-2 tablets (50-100 mg total) by mouth every 8 (eight) hours as needed for severe pain. 02/17/17   Alfonse Spruce, FNP    Family History Family History  Problem Relation Age of Onset  . Hypertension Mother   . Lung cancer Father   . COPD Sister     Social History Social History  Substance Use Topics  . Smoking status: Current Every Day Smoker    Packs/day: 0.25    Types: Cigarettes  . Smokeless tobacco: Never Used     Comment: pt is also using nicotine patch  . Alcohol use Yes     Comment: occ beer      Allergies   Patient has no known allergies.   Review of Systems Review of Systems  HENT: Negative for facial swelling.   Cardiovascular: Negative for chest pain.  Gastrointestinal: Negative for nausea and vomiting.  Musculoskeletal: Positive for arthralgias and myalgias. Negative for joint swelling.  Skin: Negative for color change and wound.  Neurological: Positive for syncope and weakness.  All other systems reviewed and are negative.    Physical Exam Updated Vital Signs BP 96/68   Pulse 92   Temp 97.8 F (36.6 C) (Oral)   Resp 18   Ht 6' (1.829 m)   Wt 145 lb (65.8 kg)   SpO2 99%   BMI 19.67 kg/m   Physical Exam  Constitutional: He is oriented to person, place, and time. He appears well-developed and well-nourished.  HENT:  Head: Normocephalic and atraumatic.  Eyes: EOM are normal.  Neck: Normal range of motion.  Cardiovascular: Normal rate, regular rhythm, normal heart sounds and intact distal pulses.   Pulmonary/Chest: Effort normal and breath sounds normal. No respiratory distress.  Abdominal: Soft. He exhibits no distension. There is no tenderness.  Musculoskeletal: Normal range of motion. He exhibits tenderness.  C-collar in place. Mild mid-lower c-spine tenderness.  Neurological: He  is alert and oriented to person, place, and time.  Contracture RUE. 4/5 strength to RUE and RLE.  Skin: Skin is warm and dry.  Psychiatric: He has a normal mood and affect. Judgment normal.  Nursing note and vitals reviewed.    ED Treatments / Results  DIAGNOSTIC STUDIES: Oxygen Saturation is 99% on RA, NL by my interpretation.    COORDINATION OF CARE: 2:57 PM-Discussed next steps with pt. Pt verbalized understanding and is agreeable with the plan. Will order labs and medications.   Labs (all labs ordered are listed, but only abnormal results are displayed) Labs Reviewed - No data to display  EKG  EKG Interpretation None  Radiology No results found.  Procedures Procedures (including critical care time)  Medications Ordered in ED Medications - No data to display   Initial Impression / Assessment and Plan / ED Course  I have reviewed the triage vital signs and the nursing notes.  Pertinent labs & imaging results that were available during my care of the patient were reviewed by me and considered in my medical decision making (see chart for details).     Syncope. Alcohol may be contributing. Now feels much better. Hd stable in ED. It has been determined that no acute conditions requiring further emergency intervention are present at this time. The patient has been advised of the diagnosis and plan. I reviewed any labs and imaging including any potential incidental findings. We have discussed signs and symptoms that warrant return to the ED and they are listed in the discharge instructions.    Final Clinical Impressions(s) / ED Diagnoses   Final diagnoses:  Syncope, unspecified syncope type    New Prescriptions New Prescriptions   No medications on file    I personally preformed the services scribed in my presence. The recorded information has been reviewed is accurate. [name], MD.    [name], MD 03/10/17 [phone]

## 2017-03-02 ENCOUNTER — Other Ambulatory Visit: Payer: Self-pay | Admitting: Family Medicine

## 2017-03-02 DIAGNOSIS — Z789 Other specified health status: Secondary | ICD-10-CM

## 2017-03-02 DIAGNOSIS — Z1212 Encounter for screening for malignant neoplasm of rectum: Secondary | ICD-10-CM

## 2017-03-02 DIAGNOSIS — Z1211 Encounter for screening for malignant neoplasm of colon: Secondary | ICD-10-CM

## 2017-03-02 DIAGNOSIS — D649 Anemia, unspecified: Secondary | ICD-10-CM

## 2017-03-02 MED ORDER — FERROUS SULFATE 325 (65 FE) MG PO TABS: 325.0000 mg | ORAL_TABLET | Freq: Three times a day (TID) | ORAL | 3 refills | Status: DC

## 2017-03-02 MED ORDER — DOCUSATE SODIUM 100 MG PO CAPS: 100.0000 mg | ORAL_CAPSULE | Freq: Two times a day (BID) | ORAL | 2 refills | Status: DC | PRN

## 2017-03-03 ENCOUNTER — Ambulatory Visit: Payer: Medicaid Other

## 2017-03-03 ENCOUNTER — Telehealth: Payer: Self-pay

## 2017-03-03 ENCOUNTER — Encounter: Payer: Medicaid Other | Admitting: Occupational Therapy

## 2017-03-03 NOTE — Telephone Encounter (Signed)
CMA call regarding lab results   Patient verify DOB  Patient was aware and understood  

## 2017-03-03 NOTE — Telephone Encounter (Signed)
-----   Message from Alfonse Spruce, [place] sent at 03/02/2017  1:43 PM EDT ----- Labs that evaluated your blood cells show anemia. You will be prescribed iron supplements. Recommend scheduling colonoscopy to rule out other probable causes of anemia. Recommend scheduling follow up in 6 weeks for anemia.  Labs that check fluid and electrolytes normal. -Increase your dietary iron intake. Good sources of iron include dark green leafy vegetables, meats, beans, and iron fortified cereals. Iron supplements could cause constipation you will be prescribed stool softer.

## 2017-03-06 ENCOUNTER — Ambulatory Visit: Payer: Medicaid Other | Admitting: Physical Therapy

## 2017-03-06 ENCOUNTER — Ambulatory Visit: Payer: Medicaid Other | Admitting: Occupational Therapy

## 2017-03-06 ENCOUNTER — Encounter: Payer: Medicaid Other | Attending: Physical Medicine & Rehabilitation | Admitting: Physical Medicine & Rehabilitation

## 2017-03-06 ENCOUNTER — Encounter: Payer: Self-pay | Admitting: Physical Medicine & Rehabilitation

## 2017-03-06 VITALS — BP 127/88 | HR 105 | Resp 14

## 2017-03-06 DIAGNOSIS — S14129A Central cord syndrome at unspecified level of cervical spinal cord, initial encounter: Secondary | ICD-10-CM | POA: Insufficient documentation

## 2017-03-06 DIAGNOSIS — M545 Low back pain: Secondary | ICD-10-CM | POA: Diagnosis not present

## 2017-03-06 DIAGNOSIS — G8929 Other chronic pain: Secondary | ICD-10-CM | POA: Insufficient documentation

## 2017-03-06 DIAGNOSIS — M79609 Pain in unspecified limb: Secondary | ICD-10-CM | POA: Diagnosis not present

## 2017-03-06 DIAGNOSIS — M542 Cervicalgia: Secondary | ICD-10-CM | POA: Insufficient documentation

## 2017-03-06 DIAGNOSIS — M792 Neuralgia and neuritis, unspecified: Secondary | ICD-10-CM | POA: Diagnosis not present

## 2017-03-06 DIAGNOSIS — S14129S Central cord syndrome at unspecified level of cervical spinal cord, sequela: Secondary | ICD-10-CM | POA: Diagnosis not present

## 2017-03-06 DIAGNOSIS — S14109S Unspecified injury at unspecified level of cervical spinal cord, sequela: Secondary | ICD-10-CM

## 2017-03-06 DIAGNOSIS — F17210 Nicotine dependence, cigarettes, uncomplicated: Secondary | ICD-10-CM | POA: Insufficient documentation

## 2017-03-06 DIAGNOSIS — R202 Paresthesia of skin: Secondary | ICD-10-CM | POA: Diagnosis not present

## 2017-03-06 DIAGNOSIS — M25511 Pain in right shoulder: Secondary | ICD-10-CM | POA: Diagnosis not present

## 2017-03-06 DIAGNOSIS — D649 Anemia, unspecified: Secondary | ICD-10-CM | POA: Insufficient documentation

## 2017-03-06 DIAGNOSIS — N281 Cyst of kidney, acquired: Secondary | ICD-10-CM | POA: Insufficient documentation

## 2017-03-06 MED ORDER — GABAPENTIN 300 MG PO CAPS: 600.0000 mg | ORAL_CAPSULE | Freq: Four times a day (QID) | ORAL | 2 refills | Status: DC

## 2017-03-06 NOTE — Progress Notes (Signed)
Subjective:    Patient ID: Patrick Romero, male    DOB: 12-03-1960, 56 y.o.   MRN: [id number]  HPI   [name] is here in follow up of his cervcial SCI. He continues to have weakness in his arms/hands with associated burning and numbness. Gabapentin has helped with his neuropathic pain as well as spasms. There was a break in his therapy due to MCD. He resumes PT later this week.   He is no longer on baclofen. He uses flexeril which seems to help. Ibuprofen is occasionally used also  Bowels and bladder are functioning. He has prostrate work up this FAll.   Pain Inventory Average Pain 7 Pain Right Now 7 My pain is constant, sharp, burning, dull, stabbing, tingling and aching  In the last 24 hours, has pain interfered with the following? General activity 0 Relation with others 0 Enjoyment of life 0 What TIME of day is your pain at its worst? all Sleep (in general) Fair  Pain is worse with: walking, bending, sitting, standing and some activites Pain improves with: heat/ice and medication Relief from Meds: 1  Mobility walk with assistance use a cane ability to climb steps?  yes do you drive?  no transfers alone  Function I need assistance with the following:  dressing, meal prep, household duties and shopping Do you have any goals in this area?  yes  Neuro/Psych weakness numbness tremor spasms dizziness depression  Prior Studies Any changes since last visit?  no  Physicians involved in your care Any changes since last visit?  no   Family History  Problem Relation Age of Onset  . Hypertension Mother   . Lung cancer Father   . COPD Sister    Social History   Social History  . Marital status: Single    Spouse name: N/A  . Number of children: N/A  . Years of education: N/A   Social History Main Topics  . Smoking status: Current Every Day Smoker    Packs/day: 0.25    Types: Cigarettes  . Smokeless tobacco: Never Used     Comment: pt is also using nicotine  patch  . Alcohol use Yes     Comment: occ beer   . Drug use: No  . Sexual activity: Yes   Other Topics Concern  . None   Social History Narrative  . None   Past Surgical History:  Procedure Laterality Date  . ROBOTIC ASSITED PARTIAL NEPHRECTOMY Right 01/04/2017   Procedure: XI ROBOTIC ASSITED PARTIAL NEPHRECTOMY;  Surgeon: Patrick Frock, MD;  Location: [facility];  Service: Urology;  Laterality: Right;   Past Medical History:  Diagnosis Date  . Arthritis   . Chronic back pain   . Chronic neck pain   . Depression   . Herniated disc, cervical   . Hypertension   . Sickle cell trait ([place])   . Stroke [facility])    weakness in right hand and leg , numbness on left    BP 127/88 (BP Location: Right Arm, Patient Position: Sitting, Cuff Size: Normal)   Pulse (!) 105   Resp 14   SpO2 97%   Opioid Risk Score:   Fall Risk Score:  `1  Depression screen PHQ 2/9  Depression screen [facility] 2/9 02/17/2017 12/16/2016 11/23/2016 10/26/2016  Decreased Interest 2 1 1 1   Down, Depressed, Hopeless 2 2 1 2   PHQ - 2 Score 4 3 2 3   Altered sleeping 2 1 3 1   Tired, decreased energy 2 2  1 1  Change in appetite 0 - 0 0  Feeling bad or failure about yourself  1 0 0 1  Trouble concentrating 2 0 0 1  Moving slowly or fidgety/restless 0 2 1 1   Suicidal thoughts 0 0 0 0  PHQ-9 Score 11 8 7 8   Difficult doing work/chores - - - Somewhat difficult    Review of Systems  Constitutional: Positive for diaphoresis.  HENT: Negative.   Eyes: Negative.   Respiratory: Positive for shortness of breath.   Cardiovascular: Negative.   Gastrointestinal: Negative.   Endocrine:       High blood sugar  Genitourinary: Negative.   Musculoskeletal: Positive for arthralgias, back pain, joint swelling, neck pain and neck stiffness.       Spasms   Skin: Negative.   Allergic/Immunologic: Negative.   Neurological: Positive for dizziness, tremors, weakness and numbness.  Psychiatric/Behavioral: Positive for dysphoric mood.    All other systems reviewed and are negative.      Objective:   Physical Exam  HEENT: Poor dentition. Normocephalic. Atraumatic.  Cardio: RRR without murmur. No JVD  Neck: normal appearance. Head forward slightly Resp: CTA Bilaterally without wheezes or rales. Normal effort  Skin: CDI  Neuro: Alert and oriented x 3+ to 4/5 left deltoid, biceps,4triceps, wrist extension 4/5, hand grip 4/5 . 3-/5 right deltoid muscle, biceps,3 triceps, wrist extension. Hand grip 3.  LLE: 4-/5 at the ankle dorsiflexor, plantar flexor, 4 KE/HF RLE: 3+ HF, 3/5 KE and 3/5 ADF/PF.  Decreased sensation in U>L, R>L Continued narrow base and still displays some scissoring.  Musc: right shoulder with mild sublux. Passive abduction to 80 degrees, decreased ER/IR with passive movement---tightness limits ability to lift arm over head once again. Pt had pain with end range of motion as well. Pain along spinous process of C7. Limited cervical ROM in all fields.  Psych pleasant and appropriate.  Gen.: NAD      Assessment & Plan:  1. 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 --he resumes therapy this week  -discussed holding on wrist splint to work on more active use of hand/wrist/ROM 2.  Pain Management:   -refill diclofenac 60mg  BID -gabapentin for neuropathic pain--increase to 600mg  QID 3. Spasticity: actually appears improved. Baclofen may actually be adversely affecting balance.              -no longer on baclofen             -will use flexeril prn for breakthrough spasms -continue gait/strengthening/ROM 4. Anemia:follow up per primary  5. Right renal carcinoma s/p partial nephrectomy right (01/04/17)             -scheduled to follow up with urology/oncology             -prostate work up this Fall.  6. . ETOH abuse/Tobacco: continue etoh abstinence 7. Right shoulder pain/capsulitis: continue stretching. outpt OT   15  minutes of face to face patient care time were spent during this visit. All questions were encouraged and answered. Folllow up in 6 weeks. Greater than 50% of time during this encounter was spent counseling patient/family in regard to pain mgt/splinting.

## 2017-03-06 NOTE — Patient Instructions (Signed)
WORK ON STRETCHING THE RIGHT WRIST---MOVE AWAY FROM USING THE SPLINT.    PLEASE FEEL FREE TO CALL OUR OFFICE WITH ANY PROBLEMS OR QUESTIONS ([phone])

## 2017-03-10 ENCOUNTER — Ambulatory Visit: Payer: Medicaid Other | Admitting: Rehabilitative and Restorative Service Providers"

## 2017-03-10 ENCOUNTER — Ambulatory Visit: Payer: Medicaid Other | Admitting: Occupational Therapy

## 2017-03-10 DIAGNOSIS — M6281 Muscle weakness (generalized): Secondary | ICD-10-CM

## 2017-03-10 DIAGNOSIS — R278 Other lack of coordination: Secondary | ICD-10-CM

## 2017-03-10 DIAGNOSIS — M25611 Stiffness of right shoulder, not elsewhere classified: Secondary | ICD-10-CM

## 2017-03-10 DIAGNOSIS — M79642 Pain in left hand: Secondary | ICD-10-CM

## 2017-03-10 DIAGNOSIS — R2681 Unsteadiness on feet: Secondary | ICD-10-CM

## 2017-03-10 DIAGNOSIS — S14129S Central cord syndrome at unspecified level of cervical spinal cord, sequela: Secondary | ICD-10-CM

## 2017-03-10 DIAGNOSIS — R208 Other disturbances of skin sensation: Secondary | ICD-10-CM

## 2017-03-10 DIAGNOSIS — R2689 Other abnormalities of gait and mobility: Secondary | ICD-10-CM

## 2017-03-10 DIAGNOSIS — G8929 Other chronic pain: Secondary | ICD-10-CM

## 2017-03-10 DIAGNOSIS — M25511 Pain in right shoulder: Secondary | ICD-10-CM

## 2017-03-10 DIAGNOSIS — M79641 Pain in right hand: Secondary | ICD-10-CM

## 2017-03-10 NOTE — Therapy (Signed)
[facility] [address] [place], [place], [address] Phone: [phone]   Fax:  [phone]  Occupational Therapy Treatment  Patient Details  Name: Patrick Romero MRN: [id number] Date of Birth: 07/23/1960 Referring Provider: Dr Naaman Plummer  Encounter Date: 03/10/2017      OT End of Session - 03/10/17 1646    Visit Number 2   Number of Visits 10   Authorization Type Medicaid - need to confirm [facility] visits   Authorization - Visit Number 2   Authorization - Number of Visits 10   OT Start Time 4650   OT Stop Time 1530   OT Time Calculation (min) 45 min   Activity Tolerance Patient tolerated treatment well   Behavior During Therapy [facility] for tasks assessed/performed      Past Medical History:  Diagnosis Date  . Arthritis   . Chronic back pain   . Chronic neck pain   . Depression   . Herniated disc, cervical   . Hypertension   . Sickle cell trait ([place])   . Stroke [facility])    weakness in right hand and leg , numbness on left     Past Surgical History:  Procedure Laterality Date  . ROBOTIC ASSITED PARTIAL NEPHRECTOMY Right 01/04/2017   Procedure: XI ROBOTIC ASSITED PARTIAL NEPHRECTOMY;  Surgeon: [name], MD;  Location: [facility];  Service: Urology;  Laterality: Right;    There were no vitals filed for this visit.      Subjective Assessment - 03/10/17 1643    Subjective  Patient shared that he was recently in the ED because he passed out   Currently in Pain? Yes   Pain Score 7    Pain Location Arm   Pain Orientation Right   Pain Descriptors / Indicators Aching   Pain Type Chronic pain   Pain Onset More than a month ago   Pain Frequency Constant                      OT Treatments/Exercises (OP) - 03/10/17 0001      ADLs   LB Dressing Patient indicates that he still needs help with lower body dressing.     ADL Comments Reviewed OT goals, patient in agreement.       Neurological Re-education  Exercises   Other Exercises 1 Establashed HEP for right shoulder stretching and active assisted motion.                  OT Education - 03/10/17 1646    Education provided Yes   Education Details Reviewed OT goals and initiated HEP for right UE   Person(s) Educated Patient   Methods Explanation;Demonstration;Handout   Comprehension Verbalized understanding;Returned demonstration          OT Short Term Goals - 03/10/17 1649      OT SHORT TERM GOAL #1   Title Patient will complete a home exercise program designed to improve right upper extremity range of motion with min assist   Status On-going     OT SHORT TERM GOAL #2   Title Patient will complete a home exercise program designed to improve right hand strangth   Status On-going     OT SHORT TERM GOAL #3   Title Patient will cut food using two hands with modified independence   Status On-going     OT SHORT TERM GOAL #4   Title Patient will retrieve paper or coins from right pants pocket with  right hand and increased time.   Status On-going     OT SHORT TERM GOAL #5   Title Patient will don a more form fitting pullover shirt without assistance in less than one minute   Status On-going           OT Long Term Goals - 02/10/17 1122      OT LONG TERM GOAL #1   Title Patient will complete an update home exercise program to address active range of motion thrughtout right UE   Baseline Patient is not currently able to complete an active range of motion exercise program due to shoulder tightness and pain (right)   Time 8   Period Weeks   Status New   Target Date 04/26/17  to allow grace period for [facility] approval     OT LONG TERM GOAL #2   Title Patient will demonstrate sufficient active shoulder motion to assist right hand to obtain lightweight object from chest height shelf (80+ degrees of right shoulder flexion)    Baseline Patient currently has 60 degrees of shoulder flexion   Time 8   Period Weeks   Status  New     OT LONG TERM GOAL #3   Title Patient will demonstrate ability to chop or peel vegetables with close supervision and adaptive equipment, or bilateral technique   Baseline Patient avoids any use of knives at this time due to hand pain, limited strength,a nd parasthesisas   Time 8   Period Weeks   Status New     OT LONG TERM GOAL #4   Title Patient will demonstrate sufficient strength to hold a key,a nd turn a key in a lock to open a door, turn ignition using right hand   Baseline Patient lacks sufficient forearm motion, and pinch strength to effectively turn a key    Time 8   Period Weeks   Status New     OT LONG TERM GOAL #5   Title Patient will button shirt, zip lightweight jacket using bilateral technique or adaptive equipment    Baseline Patient avoids buttons, zippers at this time   Time 8   Period Weeks   Status New     Long Term Additional Goals   Additional Long Term Goals Yes     OT LONG TERM GOAL #6   Title Patient will report pain no greater than 4/10 in right shoulder following functional use as needed for dressing, or simple cooking task   Baseline Patient currently has constant pain ~ 6/10 in right shoulder    Time 8   Period Weeks   Status New               Plan - 03/10/17 1647    Clinical Impression Statement Patient has chronic and constant paresthesias - pain in upper extremities.  He has significant tightness throughout right UE, and trunk.  Patient with limited visits, have stressed the importance of carrying over exercises and activities to home.  Patient agrees.     Rehab Potential Good   OT Frequency 2x / week   OT Duration 8 weeks   OT Treatment/Interventions Self-care/ADL training;Electrical Stimulation;Moist Heat;Fluidtherapy;Ultrasound;Therapeutic exercise;Neuromuscular education;Energy conservation;DME and/or AE instruction;Passive range of motion;Manual Therapy;Functional Mobility Training;Splinting;Therapeutic activities;Cognitive  remediation/compensation;Patient/family education;Balance training   Plan Review HEP, need to address external rotation right, elbow and wrist active extension   Consulted and Agree with Plan of Care Patient      Patient will benefit from skilled therapeutic intervention in order  to improve the following deficits and impairments:  Abnormal gait, Decreased activity tolerance, Decreased balance, Decreased cognition, Decreased coordination, Decreased range of motion, Decreased mobility, Decreased endurance, Decreased strength, Difficulty walking, Increased edema, Impaired UE functional use, Impaired tone, Impaired sensation, Impaired flexibility, Impaired perceived functional ability, Increased muscle spasms, Improper body mechanics, Improper spinal/pelvic alignment, Pain  Visit Diagnosis: Muscle weakness (generalized)  Central cord syndrome, sequela (HCC)  Stiffness of right shoulder, not elsewhere classified  Other lack of coordination  Other disturbances of skin sensation  Unsteadiness on feet  Pain in right hand  Pain in left hand  Chronic right shoulder pain    Problem List Patient Active Problem List   Diagnosis Date Noted  . Hypertension 02/17/2017  . Renal mass 01/04/2017  . Renal carcinoma, right ([place]) 11/23/2016  . Atherosclerosis of aorta ([place]) 11/23/2016  . 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 Acute blood loss anemia   . Dysesthesia   . History of syncope   . 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 Tetraparesis ([place])   . Central cord syndrome ([place])   . Anemia   . Renal cyst   . ETOH abuse   . Chest wall pain   . Neuropathic pain   . Neck pain   . Chronic neck and back pain 09/01/2016  . Alcohol intoxication ([place]) 09/01/2016  . Tobacco abuse 09/01/2016    Mariah Milling, OTR/L 03/10/2017, 4:50 PM  [facility] [address] [place], [place], [address] Phone:  [phone]   Fax:  [phone]  Name: Patrick Romero MRN: [id number] Date of Birth: 15-May-1961

## 2017-03-10 NOTE — Patient Instructions (Signed)
Arm resting on pillow at your side at about 45 degree angle.   Bend your knees, and allow your body to roll to the left - keeping your arm on the pillow.   5 x's  Lying on your back on the bed or on the floor. Holding onto large ball with both hands complete a chest press. Press up to chin height with both hands.   Concentrate on straightening your elbow as much as you can, and move smoothly. 10 x rest repeat  From chest press position take ball overhead as far as possible without pain. 10 x rest repeat  Diagonals  Holding ball with two hands make an X Ball down toward right hip then left shoulder Ball down toward left hip then right shoulder. 10 x each direction.

## 2017-03-10 NOTE — Patient Instructions (Addendum)
Plank to Downward Dog (Support)    Hands on countertop, push hips back while holding on and feel stretch down back of legs.   Hold, abdominals engaged for __20-30 seconds.   Return to stand and then repeat 3 times.  1-2 times/day for leg stretching. Copyright  VHI. All rights reserved.    Heel Cord Stretch    Place one leg forward, bent, other leg behind and straight. Lean forward keeping back heel flat. Hold ___320-30_ seconds while counting out loud. Repeat with other leg. Repeat __3__ times. Do __1-2__ sessions per day.  http://gt2.exer.us/511   Copyright  VHI. All rights reserved.  KNEE: Flexion - Standing    Bend knee to move heel toward buttocks. Do not move thigh forward. __10_ reps per set, _1__ sets per day.  DO NOT LEAN FORWARD.  Copyright  VHI. All rights reserved.   Toe Raise (Standing)    Rock back on heels.  BE NEAR A COUNTERTOP AND HOLD FOR SUPPORT. Repeat __20__ times per set. Do _1___ sets per session. Do _1-2___ sessions per day.  http://orth.exer.us/42   Copyright  VHI. All rights reserved.    Thoracic Self-Mobilization (Supine)    With rolled towel placed lengthwise at lower ribs level, lie back on towel with arms outstretched. Hold _2 minutes. Relax. Repeat __1-2__ times per day as needed for postural stretching.  http://orth.exer.us/1001   Copyright  VHI. All rights reserved.   Extensors, Supine    Lie supine, head on small, rolled towel or one pillow.  Gently tuck chin and bring toward chest. Hold __5_ seconds. Repeat __10_ times per session. Do _2__ sessions per day.  Copyright  VHI. All rights reserved.

## 2017-03-10 NOTE — Therapy (Signed)
[facility] [address] [place], [place], [address] Phone: [phone]   Fax:  [phone]  Physical Therapy Treatment  Patient Details  Name: Patrick Romero MRN: [id number] Date of Birth: [birth year] Referring Provider: Dr. Naaman Plummer  Encounter Date: 03/10/2017      PT End of Session - 03/10/17 1519    Visit Number 2   Number of Visits 9   Authorization Type MCD: 9 visits approved thru 04/27/17   PT Start Time 1410   PT Stop Time 1450   PT Time Calculation (min) 40 min   Equipment Utilized During Treatment Gait belt   Activity Tolerance Patient tolerated treatment well   Behavior During Therapy [facility] for tasks assessed/performed      Past Medical History:  Diagnosis Date  . Arthritis   . Chronic back pain   . Chronic neck pain   . Depression   . Herniated disc, cervical   . Hypertension   . Sickle cell trait ([place])   . Stroke [facility])    weakness in right hand and leg , numbness on left     Past Surgical History:  Procedure Laterality Date  . ROBOTIC ASSITED PARTIAL NEPHRECTOMY Right 01/04/2017   Procedure: XI ROBOTIC ASSITED PARTIAL NEPHRECTOMY;  Surgeon: [name], MD;  [facility];  Service: Urology;  Laterality: Right;    There were no vitals filed for this visit.      Subjective Assessment - 03/10/17 1440    Subjective The patient reports that the right leg locks up on him sometimes.  He noted another syncopal episode mid August in which he was seen at the ED (PT reviewed notes in epic).   Pertinent History R renal carcinoma, atherosclerosis of aorta, central cord syndrom s/p fall, R shoulder adhesive capsulitis, ETOH, nephrectomy on 01/04/17 (R kidney)   Patient Stated Goals "get my leg strength back, walk normal."   Currently in Pain? Yes  see OT note for UE pain rating                         [facility] Adult PT Treatment/Exercise - 03/10/17 1521      Ambulation/Gait   Ambulation/Gait Yes   Ambulation/Gait Assistance 5: Supervision   Ambulation/Gait Assistance Details PT provided verbal cues for longer stride length.  Patient has right LE muscle tone that hinders knee flexion to initiate swing phase.  He has intermittent R foot catching that he self recovers from.   Ambulation Distance (Feet) 200 Feet   Assistive device None   Gait Comments Arrived with cane, however reports he does not use in his home.     Neuro Re-ed    Neuro Re-ed Details  Sidestepping along countertop R and L x 10 feet x 2 reps, backwards walking with intermittent UE support.  Alternating LE lunges with CGA x 10 reps.     Exercises   Exercises Other Exercises   Other Exercises  STANDING:  modified down dog with UEs supported on countertop for posterior leg stretching, attempted gentle trunk extension from down dog but did not recommend for home due to R UE weakness, hip adductor stretch standing with UEs supported at countertop.  Heel cord stretch with UE support at countertop.  Standing knee flexion with tactile and verbal cues to avoid trunk leaning x 10 reps, standing toe raises with UE support.   SUPINE towel roll stretch with neural stretching L UE.  R UE limited at  pec minor with some pain noted.  PT recommended he perform within tolerable range for home.  Chin tuck with 5 second holds x 5 reps with towel roll for deep neck flexors.                  PT Short Term Goals - 02/10/17 1442      PT SHORT TERM GOAL #1   Title Pt will be IND in HEP to improve strength, balance, and flexibility. TARGET DATE FOR ALL STGS: after 4 treatment sessions   Baseline No HEP   Status New     PT SHORT TERM GOAL #2   Title Pt will amb. 300' over even terrain with LRAD at MOD I level to improve safety during functional mobility.    Baseline 80', even terrain, with S and LBQC   Status New     PT SHORT TERM GOAL #3   Title Pt will perform TUG with LRAD in </=13.5 seconds to decr. falls  risk.    Baseline 17.35 sec. with LBQC   Status New           PT Long Term Goals - 02/10/17 1445      PT LONG TERM GOAL #1   Title Pt will verbalize understanding at fall prevention strategies to decr. falls risk. TARGET DATE FOR ALL LTGS: after 8 treatment visits   Baseline Unable to verbalize fall prevention strategies.    Status New     PT LONG TERM GOAL #2   Title Pt will improve DGI score to >/=20/24 to decr. falls risk.    Baseline 12/24   Status New     PT LONG TERM GOAL #3   Title Pt will improve gait speed with LRAD to >/=2.29ft/sec. to safely amb. in the community.   Baseline 2.31ft/sec with [facility]   Status New     PT LONG TERM GOAL #4   Title Pt will amb. 500' over even/uneven terrain with LRAD at MOD I level to improve safety during functional mobility.    Baseline 62' with S and LBQC, even terrain   Status New               Plan - 03/10/17 1527    Clinical Impression Statement Initial HEP established with emphasis on posture, stretching, isolated motor control.  Plan to continue working towards STGs/LTGs progressing activities to tolerance.     PT Treatment/Interventions ADLs/Self Care Home Management;Biofeedback;Canalith Repostioning;Cryotherapy;Neuromuscular re-education;Balance training;Therapeutic exercise;Therapeutic activities;Manual techniques;Functional mobility training;Stair training;Gait training;Orthotic Fit/Training;DME Instruction;Patient/family education;Vestibular   PT Next Visit Plan Check HEP, add balance activities to HEP, further strengthening and stretching in clinic, assess need for brace   Consulted and Agree with Plan of Care Patient      Patient will benefit from skilled therapeutic intervention in order to improve the following deficits and impairments:  Abnormal gait, Decreased endurance, Decreased knowledge of use of DME, Decreased strength, Impaired UE functional use, Pain, Decreased balance, Decreased mobility, Impaired tone,  Decreased range of motion, Decreased coordination, Impaired flexibility, Postural dysfunction, Impaired sensation  Visit Diagnosis: Other abnormalities of gait and mobility  Muscle weakness (generalized)  Central cord syndrome, sequela ([place])     Problem List Patient Active Problem List   Diagnosis Date Noted  . Hypertension 02/17/2017  . Renal mass 01/04/2017  . Renal carcinoma, right ([place]) 11/23/2016  . Atherosclerosis of aorta ([place]) 11/23/2016  . 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 Acute blood loss anemia   . Dysesthesia   .  History of syncope   . 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 Tetraparesis ([place])   . Central cord syndrome ([place])   . Anemia   . Renal cyst   . ETOH abuse   . Chest wall pain   . Neuropathic pain   . Neck pain   . Chronic neck and back pain 09/01/2016  . Alcohol intoxication ([place]) 09/01/2016  . Tobacco abuse 09/01/2016    Amadi Yoshino, PT 03/10/2017, 3:28 PM  [facility] [address] [place], [place], [address] Phone: [phone]   Fax:  [phone]  Name: DERK DOUBEK MRN: [id number] Date of Birth: May 25, 1961

## 2017-03-14 ENCOUNTER — Ambulatory Visit: Payer: Medicaid Other

## 2017-03-14 ENCOUNTER — Ambulatory Visit: Payer: Medicaid Other | Admitting: Occupational Therapy

## 2017-03-16 ENCOUNTER — Ambulatory Visit: Payer: Medicaid Other | Admitting: Occupational Therapy

## 2017-03-16 ENCOUNTER — Ambulatory Visit: Payer: Medicaid Other | Attending: Physical Medicine & Rehabilitation

## 2017-03-16 ENCOUNTER — Encounter: Payer: Self-pay | Admitting: Occupational Therapy

## 2017-03-16 DIAGNOSIS — M25611 Stiffness of right shoulder, not elsewhere classified: Secondary | ICD-10-CM

## 2017-03-16 DIAGNOSIS — G8929 Other chronic pain: Secondary | ICD-10-CM | POA: Diagnosis present

## 2017-03-16 DIAGNOSIS — R208 Other disturbances of skin sensation: Secondary | ICD-10-CM | POA: Diagnosis present

## 2017-03-16 DIAGNOSIS — I69915 Cognitive social or emotional deficit following unspecified cerebrovascular disease: Secondary | ICD-10-CM | POA: Diagnosis present

## 2017-03-16 DIAGNOSIS — R2681 Unsteadiness on feet: Secondary | ICD-10-CM | POA: Diagnosis present

## 2017-03-16 DIAGNOSIS — M79641 Pain in right hand: Secondary | ICD-10-CM | POA: Insufficient documentation

## 2017-03-16 DIAGNOSIS — R2689 Other abnormalities of gait and mobility: Secondary | ICD-10-CM | POA: Insufficient documentation

## 2017-03-16 DIAGNOSIS — M25511 Pain in right shoulder: Secondary | ICD-10-CM | POA: Insufficient documentation

## 2017-03-16 DIAGNOSIS — M79642 Pain in left hand: Secondary | ICD-10-CM | POA: Insufficient documentation

## 2017-03-16 DIAGNOSIS — R278 Other lack of coordination: Secondary | ICD-10-CM

## 2017-03-16 DIAGNOSIS — M6281 Muscle weakness (generalized): Secondary | ICD-10-CM | POA: Insufficient documentation

## 2017-03-16 NOTE — Patient Instructions (Addendum)
Perform in a corner with a chair in front of you OR at kitchen sink with chair behind you for safety:  Feet Together (Compliant Surface) Varied Arm Positions - Eyes Closed    Stand on compliant surface: ___pillows/cushion_____ with feet together and arms at your side. Close eyes and visualize upright position. Hold__10-30__ seconds. Repeat __3__ times per session. Do __1__ sessions per day.  Copyright  VHI. All rights reserved.  Feet Together (Compliant Surface) Head Motion - Eyes Open    With eyes open, standing on compliant surface: __pillows/cushion______, feet together, move head slowly: up and down 5 times and side to side 5 times. Repeat __3__ times per session. Do __1__ sessions per day.  Copyright  VHI. All rights reserved.  Single Leg - Eyes Open    Holding support, lift right leg while maintaining balance over other leg. Progress to removing hands from support surface for longer periods of time. Switch and perform with other leg. Hold_10-30___ seconds. Repeat __3__ times per session per leg. Do __1__ sessions per day.  Copyright  VHI. All rights reserved.    Plank to Downward Dog (Support)    Hands on countertop, push hips back while holding on and feel stretch down back of legs.   Hold, abdominals engaged for __20-30 seconds.   Return to stand and then repeat 3 times.  1-2 times/day for leg stretching. Copyright  VHI. All rights reserved.    Heel Cord Stretch    Place one leg forward, bent, other leg behind and straight. Lean forward keeping back heel flat. Hold ___20-30_ seconds while counting out loud. Repeat with other leg. Repeat __3__ times. Do __1-2__ sessions per day.  http://gt2.exer.us/511   Copyright  VHI. All rights reserved.  KNEE: Flexion - Standing    Bend knee to move heel toward buttocks. Do not move thigh forward. __10_ reps per set, _1__ sets per day.  DO NOT LEAN FORWARD.  Copyright  VHI. All rights reserved.   Toe Raise  (Standing)    Rock back on heels.  BE NEAR A COUNTERTOP AND HOLD FOR SUPPORT. Repeat __20__ times per set. Do _1___ sets per session. Do _1-2___ sessions per day.  http://orth.exer.us/42   Copyright  VHI. All rights reserved.    Thoracic Self-Mobilization (Supine)    With rolled towel placed lengthwise at lower ribs level, lie back on towel with arms outstretched. Hold _2 minutes. Relax. Repeat __1-2__ times per day as needed for postural stretching.  http://orth.exer.us/1001   Copyright  VHI. All rights reserved.   Extensors, Supine    Lie supine, head on small, rolled towel or one pillow.  Gently tuck chin and bring toward chest. Hold __5_ seconds. Repeat __10_ times per session. Do _2__ sessions per day.  Copyright  VHI. All rights reserved.

## 2017-03-16 NOTE — Therapy (Signed)
[facility] [address] [place], [place], [address] Phone: [phone]   Fax:  [phone]  Occupational Therapy Treatment  Patient Details  Name: Patrick Romero MRN: [id number] Date of Birth: [birth year] Referring Provider: Dr Naaman Plummer  Encounter Date: 03/16/2017      OT End of Session - 03/16/17 1652    Visit Number 3   Number of Visits 10   Date for OT Re-Evaluation 04/27/17   Authorization Type Medicaid -pt approved 9 visits plus eval by 04/27/2017   Authorization - Visit Number 3   Authorization - Number of Visits 10   OT Start Time 1093   OT Stop Time 2355   OT Time Calculation (min) 43 min   Activity Tolerance Patient tolerated treatment well      Past Medical History:  Diagnosis Date  . Arthritis   . Chronic back pain   . Chronic neck pain   . Depression   . Herniated disc, cervical   . Hypertension   . Sickle cell trait ([place])   . Stroke [facility])    weakness in right hand and leg , numbness on left     Past Surgical History:  Procedure Laterality Date  . ROBOTIC ASSITED PARTIAL NEPHRECTOMY Right 01/04/2017   Procedure: XI ROBOTIC ASSITED PARTIAL NEPHRECTOMY;  Surgeon: [name], MD;  Location: [facility];  Service: Urology;  Laterality: Right;    There were no vitals filed for this visit.      Subjective Assessment - 03/16/17 1448    Subjective  I just have this constant aching pain in my R shoulder   Pertinent History see epic  Pt s/p R CVA.  CHECK BP!!!   Patient Stated Goals I want my arm to work better and for it to hurt less   Currently in Pain? Yes   Pain Score 7    Pain Location Shoulder   Pain Orientation Left   Pain Descriptors / Indicators Stabbing;Burning   Pain Type Chronic pain   Pain Onset More than a month ago   Pain Frequency Constant   Aggravating Factors  not sure   Pain Relieving Factors Pain medication, heating pad                       OT  Treatments/Exercises (OP) - 03/16/17 0001      ADLs   ADL Comments Addressed bed positionng for RUE as pt reports shoulder is much worse in the morning and that pain frequently wakes pt during the night. Demonstrated to pt sidelying on left side and on back. Pt able to verbalize understanding and stated "It already feels so much better."     Neurological Re-education Exercises   Other Exercises 1 Neuro re ed in supine with emphasis on leading movement with hand and allowing shoulder to follow - pt needs mod facilitation and cues - completed in closed chain activity for chest presses as well as overhead reach (bilateral).  Pt able to complete with pain no greater than 4/10 in supine with facilitation for improved alignment.  Feel pt has signficant underlying movement available in RUE once improvement in shoulder girdle alignment and reduced pain. Instructed pt in forward lean and reaching toward his R foot with hands clasped to provide prolonged stretch and begin to self mobilize scapula. Pt able to return demonstrate without pain.     Manual Therapy   Manual Therapy Joint mobilization;Soft tissue mobilization;Myofascial release;Scapular mobilization   Manual therapy  comments joint, soft tissue, and scap mob as well as myofascial release to reduce tightness and improve alignment in R shoulder girdle. Pt with significant malalignment of shoulder gircle causing pain in shoulder, upper back, into neck. Also suspect tendinitis due to point tenderness. Pt with superior, anterior subluxation and significant shouder elevation as well as anterior pulling with all arm movement.  Followed by NMR to begin to retrain RUE movement with less hiking and anterior displacement.                OT Education - 03/16/17 1647    Education provided Yes   Education Details forward lean to touch R foot with hands clasped as part of HEP, bed positioning for RUE   Person(s) Educated Patient   Methods  Explanation;Demonstration;Verbal cues   Comprehension Verbalized understanding;Returned demonstration          OT Short Term Goals - 03/16/17 1648      OT SHORT TERM GOAL #1   Title Patient will complete a home exercise program designed to improve right upper extremity range of motion with min assist - 03/27/2017   Status On-going     OT SHORT TERM GOAL #2   Title Patient will complete a home exercise program designed to improve right hand strangth   Status On-going     OT SHORT TERM GOAL #3   Title Patient will cut food using two hands with modified independence   Status On-going     OT SHORT TERM GOAL #4   Title Patient will retrieve paper or coins from right pants pocket with right hand and increased time.   Status On-going     OT SHORT TERM GOAL #5   Title Patient will don a more form fitting pullover shirt without assistance in less than one minute   Status On-going           OT Long Term Goals - 03/16/17 1648      OT LONG TERM GOAL #1   Title Patient will complete an update home exercise program to address active range of motion thrughtout right UE - 04/27/2017   Baseline Patient is not currently able to complete an active range of motion exercise program due to shoulder tightness and pain (right)   Time 8   Period Weeks   Status On-going     OT LONG TERM GOAL #2   Title Patient will demonstrate sufficient active shoulder motion to assist right hand to obtain lightweight object from chest height shelf (80+ degrees of right shoulder flexion)    Baseline Patient currently has 60 degrees of shoulder flexion   Time 8   Period Weeks   Status On-going     OT LONG TERM GOAL #3   Title Patient will demonstrate ability to chop or peel vegetables with close supervision and adaptive equipment, or bilateral technique   Baseline Patient avoids any use of knives at this time due to hand pain, limited strength,a nd parasthesisas   Time 8   Period Weeks   Status On-going      OT LONG TERM GOAL #4   Title Patient will demonstrate sufficient strength to hold a key,a nd turn a key in a lock to open a door, turn ignition using right hand   Baseline Patient lacks sufficient forearm motion, and pinch strength to effectively turn a key    Time 8   Period Weeks   Status On-going     OT LONG TERM GOAL #5  Title Patient will button shirt, zip lightweight jacket using bilateral technique or adaptive equipment    Baseline Patient avoids buttons, zippers at this time   Time 8   Period Weeks   Status On-going     OT LONG TERM GOAL #6   Title Patient will report pain no greater than 4/10 in right shoulder following functional use as needed for dressing, or simple cooking task   Baseline Patient currently has constant pain ~ 6/10 in right shoulder    Time 8   Period Weeks   Status On-going               Plan - 03/16/17 1650    Clinical Impression Statement Pt progressing toward goals. Pt with signficant pain and malalignment of R shoulder however has isolated movement underneath.   Rehab Potential Good   OT Frequency 2x / week   OT Duration 8 weeks   OT Treatment/Interventions Self-care/ADL training;Electrical Stimulation;Moist Heat;Fluidtherapy;Ultrasound;Therapeutic exercise;Neuromuscular education;Energy conservation;DME and/or AE instruction;Passive range of motion;Manual Therapy;Functional Mobility Training;Splinting;Therapeutic activities;Cognitive remediation/compensation;Patient/family education;Balance training   Plan review HEP, address shoulder pain, manual therapy to address shoulder girdle and humeral head alignment as well as elbow and wrist active extension, NMR for RUE isolated movement and functional use.   Consulted and Agree with Plan of Care Patient      Patient will benefit from skilled therapeutic intervention in order to improve the following deficits and impairments:  Abnormal gait, Decreased activity tolerance, Decreased balance,  Decreased cognition, Decreased coordination, Decreased range of motion, Decreased mobility, Decreased endurance, Decreased strength, Difficulty walking, Increased edema, Impaired UE functional use, Impaired tone, Impaired sensation, Impaired flexibility, Impaired perceived functional ability, Increased muscle spasms, Improper body mechanics, Improper spinal/pelvic alignment, Pain  Visit Diagnosis: Muscle weakness (generalized)  Stiffness of right shoulder, not elsewhere classified  Other disturbances of skin sensation  Pain in right hand  Pain in left hand  Chronic right shoulder pain  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 Hypertension 02/17/2017  . Renal mass 01/04/2017  . Renal carcinoma, right ([place]) 11/23/2016  . Atherosclerosis of aorta ([place]) 11/23/2016  . 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 Acute blood loss anemia   . Dysesthesia   . History of syncope   . 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 Tetraparesis ([place])   . Central cord syndrome ([place])   . Anemia   . Renal cyst   . ETOH abuse   . Chest wall pain   . Neuropathic pain   . Neck pain   . Chronic neck and back pain 09/01/2016  . Alcohol intoxication ([place]) 09/01/2016  . Tobacco abuse 09/01/2016    Quay Burow, OTR/L 03/16/2017, 4:54 PM  [facility] [address] [place], [place], [address] Phone: [phone]   Fax:  [phone]  Name: Patrick Romero MRN: [id number] Date of Birth: [birth year]

## 2017-03-16 NOTE — Therapy (Signed)
[facility] [address] [place], [place], [address] Phone: [phone]   Fax:  [phone]  Physical Therapy Treatment  Patient Details  Name: Patrick Romero MRN: [id number] Date of Birth: [birth year] Referring Provider: Dr. Naaman Plummer  Encounter Date: 03/16/2017      PT End of Session - 03/16/17 1450    Visit Number 3   Number of Visits 9   Date for PT Re-Evaluation 04/27/17   Authorization Type MCD: 9 visits approved thru 04/27/17   PT Start Time 1404   PT Stop Time 1443   PT Time Calculation (min) 39 min   Equipment Utilized During Treatment --  min guard to S prn   Activity Tolerance Patient tolerated treatment well   Behavior During Therapy [facility] for tasks assessed/performed      Past Medical History:  Diagnosis Date  . Arthritis   . Chronic back pain   . Chronic neck pain   . Depression   . Herniated disc, cervical   . Hypertension   . Sickle cell trait ([place])   . Stroke [facility])    weakness in right hand and leg , numbness on left     Past Surgical History:  Procedure Laterality Date  . ROBOTIC ASSITED PARTIAL NEPHRECTOMY Right 01/04/2017   Procedure: XI ROBOTIC ASSITED PARTIAL NEPHRECTOMY;  Surgeon: [name], MD;  Location: [facility];  Service: Urology;  Laterality: Right;    There were no vitals filed for this visit.      Subjective Assessment - 03/16/17 1408    Subjective Pt denied falls or changes since last visit.    Pertinent History R renal carcinoma, atherosclerosis of aorta, central cord syndrom s/p fall, R shoulder adhesive capsulitis, ETOH, nephrectomy on 01/04/17 (R kidney)   Patient Stated Goals "get my leg strength back, walk normal."   Currently in Pain? Yes   Pain Score 6    Pain Location Shoulder   Pain Orientation Left   Pain Descriptors / Indicators Stabbing;Burning   Pain Type Chronic pain   Pain Onset More than a month ago   Pain Frequency Constant   Aggravating Factors   Not sure   Pain Relieving Factors Pain medication, heating pad          Therex: Pt performed previous HEP (5-10 reps/activity) with cues and demo for technique. No incr. In pain noted. Please see pt instructions for HEP details.                      Balance Exercises - 03/16/17 1425      Balance Exercises: Standing   Standing Eyes Opened Narrow base of support (BOS);Wide (BOA);Head turns;Solid surface;3 reps;5 reps;10 secs;30 secs   Standing Eyes Closed Narrow base of support (BOS);Wide (BOA);Solid surface;3 reps;5 reps;10 secs;30 secs   SLS Eyes open;Upper extremity support 1;3 reps;10 secs   Other Standing Exercises Performed in corner with chair in front of pt with S for safety. Cues and demo for technique. Please see pt instructions for details.            PT Education - 03/16/17 1449    Education provided Yes   Education Details PT reviewed previous HEP (5-10 reps) to ensure proper technique. PT provided pt with balance HEP.    Person(s) Educated Patient   Methods Explanation;Demonstration;Tactile cues;Verbal cues;Handout   Comprehension Returned demonstration;Verbalized understanding          PT Short Term Goals - 02/10/17 1442  PT SHORT TERM GOAL #1   Title Pt will be IND in HEP to improve strength, balance, and flexibility. TARGET DATE FOR ALL STGS: after 4 treatment sessions   Baseline No HEP   Status New     PT SHORT TERM GOAL #2   Title Pt will amb. 300' over even terrain with LRAD at MOD I level to improve safety during functional mobility.    Baseline 80', even terrain, with S and LBQC   Status New     PT SHORT TERM GOAL #3   Title Pt will perform TUG with LRAD in </=13.5 seconds to decr. falls risk.    Baseline 17.35 sec. with LBQC   Status New           PT Long Term Goals - 02/10/17 1445      PT LONG TERM GOAL #1   Title Pt will verbalize understanding at fall prevention strategies to decr. falls risk. TARGET DATE FOR  ALL LTGS: after 8 treatment visits   Baseline Unable to verbalize fall prevention strategies.    Status New     PT LONG TERM GOAL #2   Title Pt will improve DGI score to >/=20/24 to decr. falls risk.    Baseline 12/24   Status New     PT LONG TERM GOAL #3   Title Pt will improve gait speed with LRAD to >/=2.76ft/sec. to safely amb. in the community.   Baseline 2.52ft/sec with [facility]   Status New     PT LONG TERM GOAL #4   Title Pt will amb. 500' over even/uneven terrain with LRAD at MOD I level to improve safety during functional mobility.    Baseline 54' with S and LBQC, even terrain   Status New               Plan - 03/16/17 1451    Clinical Impression Statement Pt tolerated former HEP well but did require cues and demo for technique during standing knee flexion and modified downward dog. Pt experienced incr. postural sway during activities which require incr. vestibular input (eyes closed and head turns on compliant surfaces). Therefore, add to HEP. Continue with POC.    PT Treatment/Interventions ADLs/Self Care Home Management;Biofeedback;Canalith Repostioning;Cryotherapy;Neuromuscular re-education;Balance training;Therapeutic exercise;Therapeutic activities;Manual techniques;Functional mobility training;Stair training;Gait training;Orthotic Fit/Training;DME Instruction;Patient/family education;Vestibular   PT Next Visit Plan  further strengthening and stretching in clinic, assess need for brace   Consulted and Agree with Plan of Care Patient      Patient will benefit from skilled therapeutic intervention in order to improve the following deficits and impairments:  Abnormal gait, Decreased endurance, Decreased knowledge of use of DME, Decreased strength, Impaired UE functional use, Pain, Decreased balance, Decreased mobility, Impaired tone, Decreased range of motion, Decreased coordination, Impaired flexibility, Postural dysfunction, Impaired sensation  Visit Diagnosis: Other  lack of coordination  Muscle weakness (generalized)  Unsteadiness on feet  Other abnormalities of gait and mobility     Problem List Patient Active Problem List   Diagnosis Date Noted  . Hypertension 02/17/2017  . Renal mass 01/04/2017  . Renal carcinoma, right ([place]) 11/23/2016  . Atherosclerosis of aorta ([place]) 11/23/2016  . 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 Acute blood loss anemia   . Dysesthesia   . History of syncope   . 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 Tetraparesis ([place])   . Central cord syndrome ([place])   . Anemia   . Renal cyst   . ETOH abuse   .  Chest wall pain   . Neuropathic pain   . Neck pain   . Chronic neck and back pain 09/01/2016  . Alcohol intoxication ([place]) 09/01/2016  . Tobacco abuse 09/01/2016    Eon Zunker L 03/16/2017, 2:53 PM  [facility] [address] [place], [place], [address] Phone: [phone]   Fax:  [phone]  Name: Patrick Romero MRN: [id number] Date of Birth: [birth year]  Geoffry Paradise, PT,DPT 03/16/17 2:54 PM Phone: [phone] Fax: [phone]

## 2017-03-20 ENCOUNTER — Encounter: Payer: Medicaid Other | Admitting: Occupational Therapy

## 2017-03-20 ENCOUNTER — Ambulatory Visit: Payer: Medicaid Other

## 2017-03-23 ENCOUNTER — Ambulatory Visit: Payer: Medicaid Other | Admitting: Occupational Therapy

## 2017-03-23 DIAGNOSIS — M79642 Pain in left hand: Secondary | ICD-10-CM

## 2017-03-23 DIAGNOSIS — R278 Other lack of coordination: Secondary | ICD-10-CM

## 2017-03-23 DIAGNOSIS — M79641 Pain in right hand: Secondary | ICD-10-CM

## 2017-03-23 DIAGNOSIS — R208 Other disturbances of skin sensation: Secondary | ICD-10-CM

## 2017-03-23 DIAGNOSIS — M25511 Pain in right shoulder: Secondary | ICD-10-CM

## 2017-03-23 DIAGNOSIS — I69915 Cognitive social or emotional deficit following unspecified cerebrovascular disease: Secondary | ICD-10-CM

## 2017-03-23 DIAGNOSIS — M6281 Muscle weakness (generalized): Secondary | ICD-10-CM

## 2017-03-23 DIAGNOSIS — G8929 Other chronic pain: Secondary | ICD-10-CM

## 2017-03-23 DIAGNOSIS — M25611 Stiffness of right shoulder, not elsewhere classified: Secondary | ICD-10-CM

## 2017-03-23 NOTE — Patient Instructions (Addendum)
1. Grip Strengthening (Resistive Putty)  Long hard squeezes and make sure you make it fat in between squeezes.    Squeeze putty using thumb and all fingers. Repeat _20___ times. Do __2__ sessions per day.   2. Roll putty into tube on table and pinch between thumb, index and middle finger.  Do 5 times. Do 2 sessions.     Copyright  VHI. All rights reserved.

## 2017-03-23 NOTE — Therapy (Signed)
[facility] [address] [place], [place], [address] Phone: [phone]   Fax:  [phone]  Occupational Therapy Treatment  Patient Details  Name: Patrick Romero MRN: [id number] Date of Birth: Aug 30, 1960 Referring Provider: Dr Naaman Plummer  Encounter Date: 03/23/2017      OT End of Session - 03/23/17 1228    Visit Number 4   Number of Visits 10   Date for OT Re-Evaluation 04/27/17   Authorization - Visit Number 4   Authorization - Number of Visits 10   OT Start Time 1101   OT Stop Time 1150   OT Time Calculation (min) 49 min   Activity Tolerance Patient tolerated treatment well      Past Medical History:  Diagnosis Date  . Arthritis   . Chronic back pain   . Chronic neck pain   . Depression   . Herniated disc, cervical   . Hypertension   . Sickle cell trait ([place])   . Stroke [facility])    weakness in right hand and leg , numbness on left     Past Surgical History:  Procedure Laterality Date  . ROBOTIC ASSITED PARTIAL NEPHRECTOMY Right 01/04/2017   Procedure: XI ROBOTIC ASSITED PARTIAL NEPHRECTOMY;  Surgeon: [name], MD;  Location: [facility];  Service: Urology;  Laterality: Right;    There were no vitals filed for this visit.      Subjective Assessment - 03/23/17 1117    Subjective  My pain is slowly getting better.   Pertinent History see epic  Pt s/p R CVA.  CHECK BP!!!   Patient Stated Goals I want my arm to work better and for it to hurt less   Currently in Pain? Yes   Pain Score 5    Pain Location Shoulder   Pain Orientation Left   Pain Descriptors / Indicators Sore   Pain Type Acute pain   Pain Onset 1 to 4 weeks ago   Aggravating Factors  sore from doing execises - I did more than they told me to   Pain Relieving Factors heat, pain med   Multiple Pain Sites Yes   Pain Score 5   Pain Location Shoulder   Pain Orientation Right   Pain Descriptors / Indicators Aching;Dull   Pain Type Chronic  pain   Pain Onset More than a month ago   Aggravating Factors  certain movements   Pain Relieving Factors trying not to raise it but it pretty much aches all the time.                       OT Treatments/Exercises (OP) - 03/23/17 0001      Neurological Re-education Exercises   Other Exercises 1 Neuro re ed to address unilateral reach in sidelying with ball and max facilitation for forward reach and then overhead reach with focus on decreasing shoulder hike and IR. Progressed to supine and addressed bilateral overhead reach in closed chain with mod facilitation.  Progressed to unilateral reach in supine and then transitioning to sitting for mid unilateral reach with functional task and min facilitation for scapular movement.  Pt able today to reach to 85* with min facilitation at scapula and 0/10 pain.     Other Exercises 2 Upgraded HEP for putty for grip strength - see pt instuctions for details.      Manual Therapy   Manual Therapy Joint mobilization;Soft tissue mobilization;Scapular mobilization   Manual therapy comments joint, soft tissue  and scapular mobility to address improved alignment prior to neuro re ed to focus on isolated RUE reach.  Pt with slowly improving alignment and decreasing pain overall.                OT Education - 03/23/17 1226    Education provided Yes   Education Details upgraded HEP putty   Person(s) Educated Patient   Methods Explanation;Demonstration;Handout   Comprehension Verbalized understanding;Returned demonstration          OT Short Term Goals - 03/23/17 1226      OT SHORT TERM GOAL #1   Title Patient will complete a home exercise program designed to improve right upper extremity range of motion with min assist - 03/27/2017   Status On-going     OT SHORT TERM GOAL #2   Title Patient will complete a home exercise program designed to improve right hand strangth   Status On-going     OT SHORT TERM GOAL #3   Title Patient  will cut food using two hands with modified independence   Status On-going     OT SHORT TERM GOAL #4   Title Patient will retrieve paper or coins from right pants pocket with right hand and increased time.   Status On-going     OT SHORT TERM GOAL #5   Title Patient will don a more form fitting pullover shirt without assistance in less than one minute   Status On-going           OT Long Term Goals - 03/23/17 1226      OT LONG TERM GOAL #1   Title Patient will complete an update home exercise program to address active range of motion thrughtout right UE - 04/27/2017   Baseline Patient is not currently able to complete an active range of motion exercise program due to shoulder tightness and pain (right)   Time 8   Period Weeks   Status On-going     OT LONG TERM GOAL #2   Title Patient will demonstrate sufficient active shoulder motion to assist right hand to obtain lightweight object from chest height shelf (80+ degrees of right shoulder flexion)    Baseline Patient currently has 60 degrees of shoulder flexion   Time 8   Period Weeks   Status On-going     OT LONG TERM GOAL #3   Title Patient will demonstrate ability to chop or peel vegetables with close supervision and adaptive equipment, or bilateral technique   Baseline Patient avoids any use of knives at this time due to hand pain, limited strength,a nd parasthesisas   Time 8   Period Weeks   Status On-going     OT LONG TERM GOAL #4   Title Patient will demonstrate sufficient strength to hold a key,a nd turn a key in a lock to open a door, turn ignition using right hand   Baseline Patient lacks sufficient forearm motion, and pinch strength to effectively turn a key    Time 8   Period Weeks   Status Achieved     OT LONG TERM GOAL #5   Title Patient will button shirt, zip lightweight jacket using bilateral technique or adaptive equipment    Baseline Patient avoids buttons, zippers at this time   Time 8   Period Weeks    Status On-going     OT LONG TERM GOAL #6   Title Patient will report pain no greater than 4/10 in right shoulder following functional use as  needed for dressing, or simple cooking task   Baseline Patient currently has constant pain ~ 6/10 in right shoulder    Time 8   Period Weeks   Status On-going               Plan - 03/23/17 1227    Clinical Impression Statement Pt progressing toward goals.  Pt with decreasing pain and improving reach with RUE   Rehab Potential Good   OT Frequency 2x / week   OT Duration 8 weeks   OT Treatment/Interventions Self-care/ADL training;Electrical Stimulation;Moist Heat;Fluidtherapy;Ultrasound;Therapeutic exercise;Neuromuscular education;Energy conservation;DME and/or AE instruction;Passive range of motion;Manual Therapy;Functional Mobility Training;Splinting;Therapeutic activities;Cognitive remediation/compensation;Patient/family education;Balance training   Plan manual therapy followed by NMR for improving functional reach with RUE with decreasing pain and increasing ROM/strength.  Check STG's.    Consulted and Agree with Plan of Care Patient      Patient will benefit from skilled therapeutic intervention in order to improve the following deficits and impairments:  Abnormal gait, Decreased activity tolerance, Decreased balance, Decreased cognition, Decreased coordination, Decreased range of motion, Decreased mobility, Decreased endurance, Decreased strength, Difficulty walking, Increased edema, Impaired UE functional use, Impaired tone, Impaired sensation, Impaired flexibility, Impaired perceived functional ability, Increased muscle spasms, Improper body mechanics, Improper spinal/pelvic alignment, Pain  Visit Diagnosis: Muscle weakness (generalized)  Stiffness of right shoulder, not elsewhere classified  Other disturbances of skin sensation  Pain in right hand  Pain in left hand  Chronic right shoulder pain  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 Hypertension 02/17/2017  . Renal mass 01/04/2017  . Renal carcinoma, right ([place]) 11/23/2016  . Atherosclerosis of aorta ([place]) 11/23/2016  . 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 Acute blood loss anemia   . Dysesthesia   . History of syncope   . 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 Tetraparesis ([place])   . Central cord syndrome ([place])   . Anemia   . Renal cyst   . ETOH abuse   . Chest wall pain   . Neuropathic pain   . Neck pain   . Chronic neck and back pain 09/01/2016  . Alcohol intoxication ([place]) 09/01/2016  . Tobacco abuse 09/01/2016    Forde Radon [facility] 03/23/2017, 12:30 PM  [place] [facility] [address] [place], [place], [address] Phone: [phone]   Fax:  [phone]  Name: HUGO LYBRAND MRN: [id number] Date of Birth: [birth year]

## 2017-03-28 ENCOUNTER — Ambulatory Visit: Payer: Medicaid Other | Admitting: Occupational Therapy

## 2017-03-28 ENCOUNTER — Encounter: Payer: Self-pay | Admitting: Occupational Therapy

## 2017-03-28 ENCOUNTER — Ambulatory Visit: Payer: Medicaid Other

## 2017-03-28 VITALS — BP 115/84 | HR 114

## 2017-03-28 VITALS — BP 115/85

## 2017-03-28 DIAGNOSIS — G8929 Other chronic pain: Secondary | ICD-10-CM

## 2017-03-28 DIAGNOSIS — M25611 Stiffness of right shoulder, not elsewhere classified: Secondary | ICD-10-CM

## 2017-03-28 DIAGNOSIS — M79641 Pain in right hand: Secondary | ICD-10-CM

## 2017-03-28 DIAGNOSIS — M25511 Pain in right shoulder: Secondary | ICD-10-CM

## 2017-03-28 DIAGNOSIS — R278 Other lack of coordination: Secondary | ICD-10-CM | POA: Diagnosis not present

## 2017-03-28 DIAGNOSIS — R2689 Other abnormalities of gait and mobility: Secondary | ICD-10-CM

## 2017-03-28 DIAGNOSIS — R208 Other disturbances of skin sensation: Secondary | ICD-10-CM

## 2017-03-28 DIAGNOSIS — M6281 Muscle weakness (generalized): Secondary | ICD-10-CM

## 2017-03-28 DIAGNOSIS — I69915 Cognitive social or emotional deficit following unspecified cerebrovascular disease: Secondary | ICD-10-CM

## 2017-03-28 DIAGNOSIS — M79642 Pain in left hand: Secondary | ICD-10-CM

## 2017-03-28 NOTE — Therapy (Signed)
Patrick Romero [address] [place], [place], [address] Phone: [phone]   Fax:  [phone]  Occupational Therapy Treatment  Patient Details  Name: Patrick Romero MRN: [id number] Date of Birth: January 08, 1961 Referring Provider: Dr Naaman Plummer  Encounter Date: 03/28/2017      OT End of Session - 03/28/17 1340    Visit Number 5   Number of Visits 10   Date for OT Re-Evaluation 04/27/17   Authorization Type Medicaid -pt approved 9 visits plus eval by 04/27/2017   Authorization - Visit Number 5   Authorization - Number of Visits 10   OT Start Time 1146   OT Stop Time 1231   OT Time Calculation (min) 45 min   Activity Tolerance Patient tolerated treatment well      Past Medical History:  Diagnosis Date  . Arthritis   . Chronic back pain   . Chronic neck pain   . Depression   . Herniated disc, cervical   . Hypertension   . Sickle cell trait ([place])   . Stroke [facility])    weakness in right hand and leg , numbness on left     Past Surgical History:  Procedure Laterality Date  . ROBOTIC ASSITED PARTIAL NEPHRECTOMY Right 01/04/2017   Procedure: XI ROBOTIC ASSITED PARTIAL NEPHRECTOMY;  Surgeon: [name], MD;  Location: [facility];  Service: Urology;  Laterality: Right;    Vitals:   03/28/17 1154  BP: 115/85        Subjective Assessment - 03/28/17 1154    Subjective  I have arthritis in both my shoulders so that's what some of this ache is   Pertinent History see epic  Pt s/p R CVA.  CHECK BP!!!   Patient Stated Goals I want my arm to work better and for it to hurt less   Currently in Pain? Yes   Pain Score 5    Pain Location Back   Pain Orientation Lower   Pain Descriptors / Indicators Aching;Sore   Pain Type Acute pain   Pain Onset In the past 7 days   Pain Frequency Constant   Aggravating Factors  if I lay on my back too long   Pain Relieving Factors heat, pain meds   Multiple Pain Sites Yes   Pain Score 5    Pain Location Shoulder   Pain Orientation Right;Left   Pain Descriptors / Indicators Aching   Pain Type Chronic pain   Pain Onset More than a month ago   Pain Frequency Constant   Aggravating Factors  cold from [facility], I think I have arthritis   Pain Relieving Factors heating pad, pain meds.                      OT Treatments/Exercises (OP) - 03/28/17 0001      Neurological Re-education Exercises   Other Exercises 1 Neuro re ed to address unilateral and bilateral reach first in closed chain progressing to unilateral in supine. Transitioned into sitting and addressed bilateral closed chain mid to beginning high reach with mod facilitation at end range (at approximately 95* in sitting) - pt with no c/o  pain.  Transitioned into standing to address mid level functional reach into cabinet for light objects. Pt able to achieve approximately 85* of RUE shoulder flexion with no pain and min facilitation to avoid shoulder hike.      Manual Therapy   Manual Therapy Joint mobilization;Soft tissue mobilization;Scapular mobilization   Manual  therapy comments Joint, soft tissue and scapular mob to address RUE shoulder girdle alignment, decrease pain and decrease tightness. Pt with signficantly less tenderness in cervical area leading to both shoulders (pt very tight and tender last session due to excessive shoulder hike in attempt to use RUE). Pt responding well to manual therapy.                   OT Short Term Goals - 03/28/17 1338      OT SHORT TERM GOAL #1   Title Patient will complete a home exercise program designed to improve right upper extremity range of motion with min assist - 03/27/2017   Status On-going     OT SHORT TERM GOAL #2   Title Patient will complete a home exercise program designed to improve right hand strangth   Status On-going     OT SHORT TERM GOAL #3   Title Patient will cut food using two hands with modified independence   Status On-going      OT SHORT TERM GOAL #4   Title Patient will retrieve paper or coins from right pants pocket with right hand and increased time.   Status On-going     OT SHORT TERM GOAL #5   Title Patient will don a more form fitting pullover shirt without assistance in less than one minute   Status On-going           OT Long Term Goals - 03/28/17 1338      OT LONG TERM GOAL #1   Title Patient will complete an update home exercise program to address active range of motion thrughtout right UE - 04/27/2017   Baseline Patient is not currently able to complete an active range of motion exercise program due to shoulder tightness and pain (right)   Time 8   Period Weeks   Status On-going     OT LONG TERM GOAL #2   Title Patient will demonstrate sufficient active shoulder motion to assist right hand to obtain lightweight object from chest height shelf (80+ degrees of right shoulder flexion)    Baseline Patient currently has 60 degrees of shoulder flexion   Time 8   Period Weeks   Status On-going     OT LONG TERM GOAL #3   Title Patient will demonstrate ability to chop or peel vegetables with close supervision and adaptive equipment, or bilateral technique   Baseline Patient avoids any use of knives at this time due to hand pain, limited strength,a nd parasthesisas   Time 8   Period Weeks   Status On-going     OT LONG TERM GOAL #4   Title Patient will demonstrate sufficient strength to hold a key,a nd turn a key in a lock to open a door, turn ignition using right hand   Baseline Patient lacks sufficient forearm motion, and pinch strength to effectively turn a key    Time 8   Period Weeks   Status Achieved     OT LONG TERM GOAL #5   Title Patient will button shirt, zip lightweight jacket using bilateral technique or adaptive equipment    Baseline Patient avoids buttons, zippers at this time   Time 8   Period Weeks   Status On-going     OT LONG TERM GOAL #6   Title Patient will report pain no  greater than 4/10 in right shoulder following functional use as needed for dressing, or simple cooking task   Baseline Patient currently has  constant pain ~ 6/10 in right shoulder    Time 8   Period Weeks   Status On-going               Plan - 03/28/17 1339    Clinical Impression Statement Pt progressing toward goals with significant improvement in tightness in RUE and functional reach of RUE.    Rehab Potential Good   OT Frequency 2x / week   OT Duration 8 weeks   OT Treatment/Interventions Self-care/ADL training;Electrical Stimulation;Moist Heat;Fluidtherapy;Ultrasound;Therapeutic exercise;Neuromuscular education;Energy conservation;DME and/or AE instruction;Passive range of motion;Manual Therapy;Functional Mobility Training;Splinting;Therapeutic activities;Cognitive remediation/compensation;Patient/family education;Balance training   Plan manul therapy followed by NMR for improving functional reach with RUE with decreasing pain an increasing ROM/strength, CHECK STG's.    Consulted and Agree with Plan of Care Patient      Patient will benefit from skilled therapeutic intervention in order to improve the following deficits and impairments:  Abnormal gait, Decreased activity tolerance, Decreased balance, Decreased cognition, Decreased coordination, Decreased range of motion, Decreased mobility, Decreased endurance, Decreased strength, Difficulty walking, Increased edema, Impaired UE functional use, Impaired tone, Impaired sensation, Impaired flexibility, Impaired perceived functional ability, Increased muscle spasms, Improper body mechanics, Improper spinal/pelvic alignment, Pain  Visit Diagnosis: Muscle weakness (generalized)  Stiffness of right shoulder, not elsewhere classified  Other disturbances of skin sensation  Pain in right hand  Pain in left hand  Chronic right shoulder pain  Cognitive social or emotional deficit following unspecified cerebrovascular  disease    Problem List Patient Active Problem List   Diagnosis Date Noted  . Hypertension 02/17/2017  . Renal mass 01/04/2017  . Renal carcinoma, right ([place]) 11/23/2016  . Atherosclerosis of aorta ([place]) 11/23/2016  . 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 Acute blood loss anemia   . Dysesthesia   . History of syncope   . 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 Tetraparesis ([place])   . Central cord syndrome ([place])   . Anemia   . Renal cyst   . ETOH abuse   . Chest wall pain   . Neuropathic pain   . Neck pain   . Chronic neck and back pain 09/01/2016  . Alcohol intoxication ([place]) 09/01/2016  . Tobacco abuse 09/01/2016    Quay Burow, OTR/L 03/28/2017, 1:43 PM  [facility] [address] [place], [place], [address] Phone: [phone]   Fax:  [phone]  Name: HURLEY SOBEL MRN: [id number] Date of Birth: Mar 25, 1961

## 2017-03-28 NOTE — Patient Instructions (Addendum)
HIP: Hamstrings - Short Sitting    Rest leg on raised surface. Keep knee straight. Keep back straight and bend at the hips. Lift chest. Hold _30-45__ seconds. _3__ reps per set, __2-3_ sets per day, _7__ days per week  Copyright  VHI. All rights reserved.   Lower Trunk Rotation Stretch    Keeping back flat and feet together, rotate knees to left side. Hold __30-45__ seconds. Switch and bring knees to the right side and hold for 30-45 seconds.  Repeat __3__ times per set. Do _2-3___ sets per day. Do __7__ days per week.  http://orth.exer.us/122   Copyright  VHI. All rights reserved.   Double Knee to Chest (Flexion)    Gently pull both knees toward chest. Feel stretch in lower back or buttock area. Breathing deeply, Hold __30__ seconds, then gently rock side to side for 15 seconds. Repeat __3__ times. Do __2-3__ sessions per day, 7 days a week.  http://gt2.exer.us/227   Copyright  VHI. All rights reserved.    Fall Prevention in the Home Falls can cause injuries and can affect people from all age groups. There are many simple things that you can do to make your home safe and to help prevent falls. What can I do on the outside of my home?  Regularly repair the edges of walkways and driveways and fix any cracks.  Remove high doorway thresholds.  Trim any shrubbery on the main path into your home.  Use bright outdoor lighting.  Clear walkways of debris and clutter, including tools and rocks.  Regularly check that handrails are securely fastened and in good repair. Both sides of any steps should have handrails.  Install guardrails along the edges of any raised decks or porches.  Have leaves, snow, and ice cleared regularly.  Use sand or salt on walkways during winter months.  In the garage, clean up any spills right away, including grease or oil spills. What can I do in the bathroom?  Use night lights.  Install grab bars by the toilet and in the tub and shower.  Do not use towel bars as grab bars.  Use non-skid mats or decals on the floor of the tub or shower.  If you need to sit down while you are in the shower, use a plastic, non-slip stool.  Keep the floor dry. Immediately clean up any water that spills on the floor.  Remove soap buildup in the tub or shower on a regular basis.  Attach bath mats securely with double-sided non-slip rug tape.  Remove throw rugs and other tripping hazards from the floor. What can I do in the bedroom?  Use night lights.  Make sure that a bedside light is easy to reach.  Do not use oversized bedding that drapes onto the floor.  Have a firm chair that has side arms to use for getting dressed.  Remove throw rugs and other tripping hazards from the floor. What can I do in the kitchen?  Clean up any spills right away.  Avoid walking on wet floors.  Place frequently used items in easy-to-reach places.  If you need to reach for something above you, use a sturdy step stool that has a grab bar.  Keep electrical cables out of the way.  Do not use floor polish or wax that makes floors slippery. If you have to use wax, make sure that it is non-skid floor wax.  Remove throw rugs and other tripping hazards from the floor. What can I do in the  stairways?  Do not leave any items on the stairs.  Make sure that there are handrails on both sides of the stairs. Fix handrails that are broken or loose. Make sure that handrails are as long as the stairways.  Check any carpeting to make sure that it is firmly attached to the stairs. Fix any carpet that is loose or worn.  Avoid having throw rugs at the top or bottom of stairways, or secure the rugs with carpet tape to prevent them from moving.  Make sure that you have a light switch at the top of the stairs and the bottom of the stairs. If you do not have them, have them installed. What are some other fall prevention tips?  Wear closed-toe shoes that fit well and  support your feet. Wear shoes that have rubber soles or low heels.  When you use a stepladder, make sure that it is completely opened and that the sides are firmly locked. Have someone hold the ladder while you are using it. Do not climb a closed stepladder.  Add color or contrast paint or tape to grab bars and handrails in your home. Place contrasting color strips on the first and last steps.  Use mobility aids as needed, such as canes, walkers, scooters, and crutches.  Turn on lights if it is dark. Replace any light bulbs that burn out.  Set up furniture so that there are clear paths. Keep the furniture in the same spot.  Fix any uneven floor surfaces.  Choose a carpet design that does not hide the edge of steps of a stairway.  Be aware of any and all pets.  Review your medicines with your healthcare provider. Some medicines can cause dizziness or changes in blood pressure, which increase your risk of falling. Talk with your health care provider about other ways that you can decrease your risk of falls. This may include working with a physical therapist or trainer to improve your strength, balance, and endurance. This information is not intended to replace advice given to you by your health care provider. Make sure you discuss any questions you have with your health care provider. Document Released: 06/17/2002 Document Revised: 11/24/2015 Document Reviewed: 08/01/2014 Elsevier Interactive Patient Education  2017 [employer].

## 2017-03-28 NOTE — Therapy (Signed)
[facility] [address] [place], [place], [address] Phone: [phone]   Fax:  [phone]  Physical Therapy Treatment  Patient Details  Name: Patrick Romero MRN: [id number] Date of Birth: [birth year] Referring Provider: Dr. Naaman Plummer  Encounter Date: 03/28/2017      PT End of Session - 03/28/17 1152    Visit Number 4   Number of Visits 9   Date for PT Re-Evaluation 04/27/17   Authorization Type MCD: 9 visits approved thru 04/27/17   PT Start Time 1103   PT Stop Time 1144   PT Time Calculation (min) 41 min   Equipment Utilized During Treatment --  S prn   Activity Tolerance Patient tolerated treatment well   Behavior During Therapy [facility] for tasks assessed/performed      Past Medical History:  Diagnosis Date  . Arthritis   . Chronic back pain   . Chronic neck pain   . Depression   . Herniated disc, cervical   . Hypertension   . Sickle cell trait ([place])   . Stroke [facility])    weakness in right hand and leg , numbness on left     Past Surgical History:  Procedure Laterality Date  . ROBOTIC ASSITED PARTIAL NEPHRECTOMY Right 01/04/2017   Procedure: XI ROBOTIC ASSITED PARTIAL NEPHRECTOMY;  Surgeon: [name], MD;  Location: [facility];  Service: Urology;  Laterality: Right;    Vitals:   03/28/17 1105  BP: 115/84  Pulse: (!) 114        Subjective Assessment - 03/28/17 1105    Subjective Pt denied falls or changes since last visit.    Pertinent History R renal carcinoma, atherosclerosis of aorta, central cord syndrom s/p fall, R shoulder adhesive capsulitis, ETOH, nephrectomy on 01/04/17 (R kidney)   Patient Stated Goals "get my leg strength back, walk normal."   Currently in Pain? Yes   Pain Score 7    Pain Location Shoulder   Pain Orientation Right;Left   Pain Descriptors / Indicators Dull;Stabbing;Aching   Pain Type Acute pain   Pain Onset 1 to 4 weeks ago   Pain Frequency Constant   Aggravating  Factors  cold air, certain movements   Pain Relieving Factors heat, pain meds   Multiple Pain Sites Yes   Pain Score --  6.5/10   Pain Location Back   Pain Orientation Lower   Pain Descriptors / Indicators Aching   Pain Type Chronic pain   Pain Onset More than a month ago   Pain Frequency Constant   Aggravating Factors  lie down on back for prolonged periods of time   Pain Relieving Factors heating pad, change positions              Therex: Pt performed low back and BLE stretches with cues and demo for technique. Please see pt instructions for HEP details. No incr. Of pain during session.            [facility] Adult PT Treatment/Exercise - 03/28/17 1120      Ambulation/Gait   Ambulation/Gait Yes   Ambulation/Gait Assistance 5: Supervision   Ambulation/Gait Assistance Details Cues to improve sequencing with [facility] and to improve R knee flexion during swing.    Ambulation Distance (Feet) 230 Feet  and 75'   Assistive device Large base quad cane   Gait Pattern Step-through pattern;Decreased arm swing - right;Decreased stride length;Decreased dorsiflexion - right;Decreased hip/knee flexion - right;Narrow base of support;Decreased trunk rotation   Ambulation Surface Level;Indoor  Self Care:     PT Education - 03/28/17 1149    Education provided Yes   Education Details PT provided pt with BLE and back stretches to improve flexibility and LBP. PT also provided pt with fall prevention strategies.    Person(s) Educated Patient   Methods Explanation;Verbal cues;Tactile cues;Demonstration;Handout   Comprehension Returned demonstration;Verbalized understanding          PT Short Term Goals - 02/10/17 1442      PT SHORT TERM GOAL #1   Title Pt will be IND in HEP to improve strength, balance, and flexibility. TARGET DATE FOR ALL STGS: after 4 treatment sessions   Baseline No HEP   Status New     PT SHORT TERM GOAL #2   Title Pt will amb. 300' over even  terrain with LRAD at MOD I level to improve safety during functional mobility.    Baseline 80', even terrain, with S and LBQC   Status New     PT SHORT TERM GOAL #3   Title Pt will perform TUG with LRAD in </=13.5 seconds to decr. falls risk.    Baseline 17.35 sec. with LBQC   Status New           PT Long Term Goals - 02/10/17 1445      PT LONG TERM GOAL #1   Title Pt will verbalize understanding at fall prevention strategies to decr. falls risk. TARGET DATE FOR ALL LTGS: after 8 treatment visits   Baseline Unable to verbalize fall prevention strategies.    Status New     PT LONG TERM GOAL #2   Title Pt will improve DGI score to >/=20/24 to decr. falls risk.    Baseline 12/24   Status New     PT LONG TERM GOAL #3   Title Pt will improve gait speed with LRAD to >/=2.37ft/sec. to safely amb. in the community.   Baseline 2.60ft/sec with [facility]   Status New     PT LONG TERM GOAL #4   Title Pt will amb. 500' over even/uneven terrain with LRAD at MOD I level to improve safety during functional mobility.    Baseline 23' with S and LBQC, even terrain   Status New               Plan - 03/28/17 1153    Clinical Impression Statement Pt's skilled session focused on stretches to decr. LBP and improve BLE flexibility/reduce RLE spasticity during gait. Pt demonstrated improvement, as he reported decr. LBP to 4.5/10 after session. PT will continue to monitor pt's HR, as it was elevated during session with no c/o SOB, palpitations, or other symptoms. Continue with POC.    PT Treatment/Interventions ADLs/Self Care Home Management;Biofeedback;Canalith Repostioning;Cryotherapy;Neuromuscular re-education;Balance training;Therapeutic exercise;Therapeutic activities;Manual techniques;Functional mobility training;Stair training;Gait training;Orthotic Fit/Training;DME Instruction;Patient/family education;Vestibular   PT Next Visit Plan Gait with and without AD, assess need for brace   Consulted  and Agree with Plan of Care Patient      Patient will benefit from skilled therapeutic intervention in order to improve the following deficits and impairments:  Abnormal gait, Decreased endurance, Decreased knowledge of use of DME, Decreased strength, Impaired UE functional use, Pain, Decreased balance, Decreased mobility, Impaired tone, Decreased range of motion, Decreased coordination, Impaired flexibility, Postural dysfunction, Impaired sensation  Visit Diagnosis: Muscle weakness (generalized)  Other abnormalities of gait and mobility     Problem List Patient Active Problem List   Diagnosis Date Noted  . Hypertension 02/17/2017  . Renal  mass 01/04/2017  . Renal carcinoma, right ([place]) 11/23/2016  . Atherosclerosis of aorta ([place]) 11/23/2016  . 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 Acute blood loss anemia   . Dysesthesia   . History of syncope   . 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 Tetraparesis ([place])   . Central cord syndrome ([place])   . Anemia   . Renal cyst   . ETOH abuse   . Chest wall pain   . Neuropathic pain   . Neck pain   . Chronic neck and back pain 09/01/2016  . Alcohol intoxication ([place]) 09/01/2016  . Tobacco abuse 09/01/2016    Mitzy Naron L 03/28/2017, 11:56 AM  [facility] [address] [place], [place], [address] Phone: [phone]   Fax:  [phone]  Name: Patrick Romero MRN: [id number] Date of Birth: May 18, 1961  Geoffry Paradise, PT,DPT 03/28/17 11:57 AM Phone: [phone] Fax: [phone]

## 2017-03-30 ENCOUNTER — Ambulatory Visit: Payer: Medicaid Other

## 2017-03-30 ENCOUNTER — Encounter: Payer: Medicaid Other | Admitting: Occupational Therapy

## 2017-04-03 ENCOUNTER — Encounter: Payer: Self-pay | Admitting: Family Medicine

## 2017-04-04 ENCOUNTER — Ambulatory Visit: Payer: Medicaid Other | Admitting: Physical Therapy

## 2017-04-04 ENCOUNTER — Ambulatory Visit: Payer: Medicaid Other | Admitting: Occupational Therapy

## 2017-04-04 ENCOUNTER — Encounter: Payer: Self-pay | Admitting: Occupational Therapy

## 2017-04-04 DIAGNOSIS — R278 Other lack of coordination: Secondary | ICD-10-CM

## 2017-04-04 DIAGNOSIS — G8929 Other chronic pain: Secondary | ICD-10-CM

## 2017-04-04 DIAGNOSIS — M6281 Muscle weakness (generalized): Secondary | ICD-10-CM

## 2017-04-04 DIAGNOSIS — M25611 Stiffness of right shoulder, not elsewhere classified: Secondary | ICD-10-CM

## 2017-04-04 DIAGNOSIS — R208 Other disturbances of skin sensation: Secondary | ICD-10-CM

## 2017-04-04 DIAGNOSIS — R2681 Unsteadiness on feet: Secondary | ICD-10-CM

## 2017-04-04 DIAGNOSIS — M25511 Pain in right shoulder: Secondary | ICD-10-CM

## 2017-04-04 DIAGNOSIS — R2689 Other abnormalities of gait and mobility: Secondary | ICD-10-CM

## 2017-04-04 DIAGNOSIS — M79641 Pain in right hand: Secondary | ICD-10-CM

## 2017-04-04 DIAGNOSIS — I69915 Cognitive social or emotional deficit following unspecified cerebrovascular disease: Secondary | ICD-10-CM

## 2017-04-04 NOTE — Therapy (Signed)
[facility] [address] [place], [place], [address] Phone: [phone]   Fax:  [phone]  Occupational Therapy Treatment  Patient Details  Name: Patrick Romero MRN: [id number] Date of Birth: 1960/11/09 Referring Provider: Dr Naaman Plummer  Encounter Date: 04/04/2017      OT End of Session - 04/04/17 1312    Visit Number 6   Number of Visits 10   Date for OT Re-Evaluation 04/27/17   Authorization Type Medicaid -pt approved 9 visits plus eval by 04/27/2017   Authorization - Visit Number 6   Authorization - Number of Visits 10   OT Start Time 0932   OT Stop Time 7017   OT Time Calculation (min) 43 min   Activity Tolerance Patient tolerated treatment well      Past Medical History:  Diagnosis Date  . Arthritis   . Chronic back pain   . Chronic neck pain   . Depression   . Herniated disc, cervical   . Hypertension   . Sickle cell trait ([place])   . Stroke [facility])    weakness in right hand and leg , numbness on left     Past Surgical History:  Procedure Laterality Date  . ROBOTIC ASSITED PARTIAL NEPHRECTOMY Right 01/04/2017   Procedure: XI ROBOTIC ASSITED PARTIAL NEPHRECTOMY;  Surgeon: [name], MD;  [facility];  Service: Urology;  Laterality: Right;    There were no vitals filed for this visit.      Subjective Assessment - 04/04/17 0941    Subjective  (P)  I am not as stiff but I have this achying that just doesn't go away - it worse in the morning I am so stiff.    Pertinent History (P)  see epic  Pt s/p R CVA.  CHECK BP!!!   Patient Stated Goals (P)  I want my arm to work better and for it to hurt less   Currently in Pain? (P)  Yes   Pain Score (P)  6    Pain Location (P)  --  all over shoulders, neck, lower back and hips   Pain Orientation (P)  Right;Left   Pain Descriptors / Indicators (P)  Aching   Pain Type (P)  Chronic pain   Pain Onset (P)  More than a month ago   Pain Frequency (P)   Constant   Aggravating Factors  (P)  worse in the morning, prolonged static positioning                      OT Treatments/Exercises (OP) - 04/04/17 1304      ADLs   UB Dressing Pt is now able to button using both hands however with small buttons task is still difficult and takes increased time. Demonstrated button hook and pt able to use with ease. Provided pt in writing how to obtain one.  Also educated pt on use of non slip surface under plate when eating and cutting - pt used in clinic today and able to cut using fork and knife mod I.     ADL Comments Checked goals today - see goals section for updates. Pt making excellent progress.  Pt also with continued complaints of "achiness and stiffness that is worse in the morning." Pt states that prior to SCI he was diagnosed with arthritis.  Pt reports that heat helps and that OTC anti inflammatory "helps some." Actual pain from malalignment of R shoulder has decreased signficantly as ROM and  functional reach has improved.  Encouraged pt to follow up with primary MD to discuss possible medical resources to address arthritis pain. Pt verablized understanding and stated he would      Manual Therapy   Manual Therapy Soft tissue mobilization;Joint mobilization   Manual therapy comments joint and soft tissue mob to R hand to imrpove ROM and decrease tightness in hand and wrist - also issued pt stretching program for home for  R hand.             Balance Exercises - 04/04/17 0924      Balance Exercises: Standing   SLS Eyes open;Upper extremity support 1;3 reps;10 secs             OT Short Term Goals - 04/04/17 1309      OT SHORT TERM GOAL #1   Title Patient will complete a home exercise program designed to improve right upper extremity range of motion with min assist - 03/27/2017   Status Achieved     OT SHORT TERM GOAL #2   Title Patient will complete a home exercise program designed to improve right hand strangth    Status Achieved     OT SHORT TERM GOAL #3   Title Patient will cut food using two hands with modified independence   Status Achieved     OT SHORT TERM GOAL #4   Title Patient will retrieve paper or coins from right pants pocket with right hand and increased time.   Status Achieved     OT SHORT TERM GOAL #5   Title Patient will don a more form fitting pullover shirt without assistance in less than one minute   Status Achieved           OT Long Term Goals - 04/04/17 1310      OT LONG TERM GOAL #1   Title Patient will complete an update home exercise program to address active range of motion thrughtout right UE - 04/27/2017   Baseline Patient is not currently able to complete an active range of motion exercise program due to shoulder tightness and pain (right)   Time 8   Period Weeks   Status On-going     OT LONG TERM GOAL #2   Title Patient will demonstrate sufficient active shoulder motion to assist right hand to obtain lightweight object from chest height shelf (80+ degrees of right shoulder flexion)    Baseline Patient currently has 60 degrees of shoulder flexion   Time 8   Period Weeks   Status On-going     OT LONG TERM GOAL #3   Title Patient will demonstrate ability to chop or peel vegetables with close supervision and adaptive equipment, or bilateral technique   Baseline Patient avoids any use of knives at this time due to hand pain, limited strength,a nd parasthesisas   Time 8   Period Weeks   Status Achieved     OT LONG TERM GOAL #4   Title Patient will demonstrate sufficient strength to hold a key,a nd turn a key in a lock to open a door, turn ignition using right hand   Baseline Patient lacks sufficient forearm motion, and pinch strength to effectively turn a key    Time 8   Period Weeks   Status Achieved     OT LONG TERM GOAL #5   Title Patient will button shirt, zip lightweight jacket using bilateral technique or adaptive equipment    Baseline Patient  avoids buttons, zippers at  this time   Time 8   Period Weeks   Status Achieved     OT LONG TERM GOAL #6   Title Patient will report pain no greater than 4/10 in right shoulder following functional use as needed for dressing, or simple cooking task   Baseline Patient currently has constant pain ~ 6/10 in right shoulder    Time 8   Period Weeks   Status On-going               Plan - 04/04/17 1310    Clinical Impression Statement Pt has met all STG"s and several LTG's.  Pt showing great improvement in functional use of RUE.   Rehab Potential Good   OT Frequency 2x / week   OT Duration 8 weeks   OT Treatment/Interventions Self-care/ADL training;Electrical Stimulation;Moist Heat;Fluidtherapy;Ultrasound;Therapeutic exercise;Neuromuscular education;Energy conservation;DME and/or AE instruction;Passive range of motion;Manual Therapy;Functional Mobility Training;Splinting;Therapeutic activities;Cognitive remediation/compensation;Patient/family education;Balance training   Plan manual therpay followed by NMR for improving functional reach with RUE with decreasing pain and increasing ROM/strength,    Consulted and Agree with Plan of Care Patient      Patient will benefit from skilled therapeutic intervention in order to improve the following deficits and impairments:  Abnormal gait, Decreased activity tolerance, Decreased balance, Decreased cognition, Decreased coordination, Decreased range of motion, Decreased mobility, Decreased endurance, Decreased strength, Difficulty walking, Increased edema, Impaired UE functional use, Impaired tone, Impaired sensation, Impaired flexibility, Impaired perceived functional ability, Increased muscle spasms, Improper body mechanics, Improper spinal/pelvic alignment, Pain  Visit Diagnosis: Muscle weakness (generalized)  Unsteadiness on feet  Other lack of coordination  Stiffness of right shoulder, not elsewhere classified  Other disturbances of skin  sensation  Pain in right hand  Chronic right shoulder pain  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 Hypertension 02/17/2017  . Renal mass 01/04/2017  . Renal carcinoma, right ([place]) 11/23/2016  . Atherosclerosis of aorta ([place]) 11/23/2016  . 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 Acute blood loss anemia   . Dysesthesia   . History of syncope   . 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 Tetraparesis ([place])   . Central cord syndrome ([place])   . Anemia   . Renal cyst   . ETOH abuse   . Chest wall pain   . Neuropathic pain   . Neck pain   . Chronic neck and back pain 09/01/2016  . Alcohol intoxication ([place]) 09/01/2016  . Tobacco abuse 09/01/2016    Quay Burow , OTR/L 04/04/2017, 1:15 PM  [facility] [address] [place] [address] [place], [place], [address] Phone: [phone]   Fax:  [phone]  Name: Patrick Romero MRN: [id number] Date of Birth: 1961/05/09

## 2017-04-04 NOTE — Therapy (Signed)
[facility] [address] [place], [place], [address] Phone: [phone]   Fax:  [phone]  Physical Therapy Treatment  Patient Details  Name: Patrick Romero MRN: [id number] Date of Birth: [birth year] Referring Provider: Dr. Naaman Plummer  Encounter Date: 04/04/2017      PT End of Session - 04/04/17 0925    Visit Number 5   Number of Visits 9   Date for PT Re-Evaluation 04/27/17   Authorization Type MCD: 9 visits approved thru 04/27/17   PT Start Time 0845   PT Stop Time 0927   PT Time Calculation (min) 42 min   Equipment Utilized During Treatment Gait belt   Activity Tolerance Patient tolerated treatment well   Behavior During Therapy [facility] for tasks assessed/performed      Past Medical History:  Diagnosis Date  . Arthritis   . Chronic back pain   . Chronic neck pain   . Depression   . Herniated disc, cervical   . Hypertension   . Sickle cell trait ([place])   . Stroke [facility])    weakness in right hand and leg , numbness on left     Past Surgical History:  Procedure Laterality Date  . ROBOTIC ASSITED PARTIAL NEPHRECTOMY Right 01/04/2017   Procedure: XI ROBOTIC ASSITED PARTIAL NEPHRECTOMY;  Surgeon: [name], MD;  [facility];  Service: Urology;  Laterality: Right;    There were no vitals filed for this visit.      Subjective Assessment - 04/04/17 0847    Subjective still c/o tightness and some ache with spasms - knows this is due to injury.   Pertinent History R renal carcinoma, atherosclerosis of aorta, central cord syndrom s/p fall, R shoulder adhesive capsulitis, ETOH, nephrectomy on 01/04/17 (R kidney)   Patient Stated Goals "get my leg strength back, walk normal."   Currently in Pain? Yes   Pain Score 6    Pain Location Back   Pain Orientation Lower   Pain Descriptors / Indicators Aching;Sore   Pain Type Acute pain;Chronic pain;Neuropathic pain   Pain Onset More than a month ago   Pain  Frequency Constant   Aggravating Factors  prolonged static positioning   Pain Relieving Factors heat, pain meds   Pain Score 7   Pain Location Shoulder   Pain Orientation Left;Right   Pain Descriptors / Indicators Aching   Pain Type Chronic pain   Pain Onset More than a month ago   Pain Frequency Constant   Aggravating Factors  cold from [facility]   Pain Relieving Factors heating pad; pain meds            [facility] PT Assessment - 04/04/17 0857      Timed Up and Go Test   Normal TUG (seconds) 16.09  with SPC                     [facility] Adult PT Treatment/Exercise - 04/04/17 0857      Ambulation/Gait   Ambulation/Gait Yes   Ambulation/Gait Assistance 5: Supervision   Ambulation/Gait Assistance Details cues for step width due to mild crossing midline on RLE   Ambulation Distance (Feet) 730 Feet  400' with SPC; 330' with [facility]   Assistive device Straight cane;Small based quad cane   Gait Pattern Step-through pattern;Decreased arm swing - right;Decreased stride length;Decreased dorsiflexion - right;Decreased hip/knee flexion - right;Narrow base of support;Decreased trunk rotation   Gait velocity 1.64 ft/sec  with SPC with quad tip: 19.94 sec  Exercises   Exercises Lumbar;Neck     Neck Exercises: Supine   Neck Retraction 10 reps;5 secs     Lumbar Exercises: Stretches   Passive Hamstring Stretch 3 reps;30 seconds   Passive Hamstring Stretch Limitations bil; seated   Double Knee to Chest Stretch 3 reps;30 seconds   Double Knee to Chest Stretch Limitations followed by rocking x 10-15 sec   Lower Trunk Rotation 3 reps;30 seconds   Lower Trunk Rotation Limitations bil   Quadruped Mid Back Stretch 3 reps;30 seconds   Quadruped Mid Back Stretch Limitations standing at counter     Neck Exercises: Stretches   Other Neck Stretches chest stretch x 2 min on foam roll             Balance Exercises - 04/04/17 0924      Balance Exercises: Standing   SLS Eyes  open;Upper extremity support 1;3 reps;10 secs             PT Short Term Goals - 04/04/17 0925      PT SHORT TERM GOAL #1   Title Pt will be IND in HEP to improve strength, balance, and flexibility. TARGET DATE FOR ALL STGS: after 4 treatment sessions   Baseline 9/25: all assessed have been met; will check corner balance next session   Status Partially Met     PT SHORT TERM GOAL #2   Title Pt will amb. 300' over even terrain with LRAD at MOD I level to improve safety during functional mobility.    Baseline S with SPC with quad tip 400'   Status Partially Met     PT SHORT TERM GOAL #3   Title Pt will perform TUG with LRAD in </=13.5 seconds to decr. falls risk.    Baseline 16.09 sec with SPC   Status On-going           PT Long Term Goals - 02/10/17 1445      PT LONG TERM GOAL #1   Title Pt will verbalize understanding at fall prevention strategies to decr. falls risk. TARGET DATE FOR ALL LTGS: after 8 treatment visits   Baseline Unable to verbalize fall prevention strategies.    Status New     PT LONG TERM GOAL #2   Title Pt will improve DGI score to >/=20/24 to decr. falls risk.    Baseline 12/24   Status New     PT LONG TERM GOAL #3   Title Pt will improve gait speed with LRAD to >/=2.9f/sec. to safely amb. in the community.   [facility] New     PT LONG TERM GOAL #4   Title Pt will amb. 500' over even/uneven terrain with LRAD at MOD I level to improve safety during functional mobility.    Baseline 8031with S and LBQC, even terrain   Status New               Plan - 04/04/17 0926    Clinical Impression Statement Pt demonstrates supervision with amb with SPC with quad tip and feel he will be able to progress to LRAD soon.  Session focused on assessment of STGs with HEP goal met for ones assessed, and no other goals met as pt is still S for amb and timed up and go minimall improved, but able to use LRAD.  Will continue to benefit  from PT to maximize function.   PT Treatment/Interventions ADLs/Self Care Home Management;Biofeedback;Canalith Repostioning;Cryotherapy;Neuromuscular re-education;Balance training;Therapeutic exercise;Therapeutic activities;Manual  techniques;Functional mobility training;Stair training;Gait training;Orthotic Fit/Training;DME Instruction;Patient/family education;Vestibular   PT Next Visit Plan Gait with and without AD, assess need for brace      Patient will benefit from skilled therapeutic intervention in order to improve the following deficits and impairments:  Abnormal gait, Decreased endurance, Decreased knowledge of use of DME, Decreased strength, Impaired UE functional use, Pain, Decreased balance, Decreased mobility, Impaired tone, Decreased range of motion, Decreased coordination, Impaired flexibility, Postural dysfunction, Impaired sensation  Visit Diagnosis: Muscle weakness (generalized)  Other abnormalities of gait and mobility  Unsteadiness on feet  Other lack of coordination     Problem List Patient Active Problem List   Diagnosis Date Noted  . Hypertension 02/17/2017  . Renal mass 01/04/2017  . Renal carcinoma, right ([place]) 11/23/2016  . Atherosclerosis of aorta ([place]) 11/23/2016  . 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 Acute blood loss anemia   . Dysesthesia   . History of syncope   . 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 Tetraparesis ([place])   . Central cord syndrome ([place])   . Anemia   . Renal cyst   . ETOH abuse   . Chest wall pain   . Neuropathic pain   . Neck pain   . Chronic neck and back pain 09/01/2016  . Alcohol intoxication ([place]) 09/01/2016  . Tobacco abuse 09/01/2016      Laureen Abrahams, PT, DPT 04/04/17 9:29 AM    [facility] [address] [place], [place], [address] Phone: [phone]   Fax:  [phone]  Name: TRAMOND SLINKER MRN: [id number] Date of  Birth: [birth year]

## 2017-04-13 ENCOUNTER — Ambulatory Visit: Payer: Medicaid Other | Attending: Physical Medicine & Rehabilitation

## 2017-04-13 ENCOUNTER — Ambulatory Visit: Payer: Medicaid Other | Admitting: Occupational Therapy

## 2017-04-13 ENCOUNTER — Encounter: Payer: Self-pay | Admitting: Occupational Therapy

## 2017-04-13 DIAGNOSIS — R278 Other lack of coordination: Secondary | ICD-10-CM

## 2017-04-13 DIAGNOSIS — I69915 Cognitive social or emotional deficit following unspecified cerebrovascular disease: Secondary | ICD-10-CM | POA: Insufficient documentation

## 2017-04-13 DIAGNOSIS — G8929 Other chronic pain: Secondary | ICD-10-CM

## 2017-04-13 DIAGNOSIS — M79641 Pain in right hand: Secondary | ICD-10-CM | POA: Diagnosis present

## 2017-04-13 DIAGNOSIS — R2681 Unsteadiness on feet: Secondary | ICD-10-CM

## 2017-04-13 DIAGNOSIS — M6281 Muscle weakness (generalized): Secondary | ICD-10-CM

## 2017-04-13 DIAGNOSIS — R2689 Other abnormalities of gait and mobility: Secondary | ICD-10-CM | POA: Diagnosis present

## 2017-04-13 DIAGNOSIS — R208 Other disturbances of skin sensation: Secondary | ICD-10-CM

## 2017-04-13 DIAGNOSIS — M25511 Pain in right shoulder: Secondary | ICD-10-CM | POA: Diagnosis present

## 2017-04-13 DIAGNOSIS — M25611 Stiffness of right shoulder, not elsewhere classified: Secondary | ICD-10-CM | POA: Diagnosis present

## 2017-04-13 DIAGNOSIS — M79642 Pain in left hand: Secondary | ICD-10-CM | POA: Insufficient documentation

## 2017-04-13 NOTE — Therapy (Signed)
[facility] [address] [place], [place], [address] Phone: [phone]   Fax:  [phone]  Physical Therapy Treatment  Patient Details  Name: Patrick Romero MRN: [id number] Date of Birth: March 09, 1961 Referring Provider: Dr. Naaman Plummer  Encounter Date: 04/13/2017      PT End of Session - 04/13/17 1444    Visit Number 6   Number of Visits 9   Date for PT Re-Evaluation 04/27/17   Authorization Type MCD: 9 visits approved thru 04/27/17   PT Start Time 1401   PT Stop Time 1441   PT Time Calculation (min) 40 min   Equipment Utilized During Treatment --  S prn   Activity Tolerance Patient tolerated treatment well   Behavior During Therapy [facility] for tasks assessed/performed      Past Medical History:  Diagnosis Date  . Arthritis   . Chronic back pain   . Chronic neck pain   . Depression   . Herniated disc, cervical   . Hypertension   . Sickle cell trait ([place])   . Stroke [facility])    weakness in right hand and leg , numbness on left     Past Surgical History:  Procedure Laterality Date  . ROBOTIC ASSITED PARTIAL NEPHRECTOMY Right 01/04/2017   Procedure: XI ROBOTIC ASSITED PARTIAL NEPHRECTOMY;  Surgeon: [name], MD;  Location: [facility];  Service: Urology;  Laterality: Right;    There were no vitals filed for this visit.      Subjective Assessment - 04/13/17 1404    Subjective Pt denied falls or changes since last visit. Pt feels that exercises are not causing pain. Pt then reported he is now taking gabapentin and cyclobenzaprine for spasms and stopped the baclofen. Pt reports    Pertinent History R renal carcinoma, atherosclerosis of aorta, central cord syndrom s/p fall, R shoulder adhesive capsulitis, ETOH, nephrectomy on 01/04/17 (R kidney)   Patient Stated Goals "get my leg strength back, walk normal."   Currently in Pain? Yes   Pain Score --  8.5/10   Pain Location Back  stinging   Pain Orientation Lower    Pain Descriptors / Indicators Aching;Sore   Pain Type Chronic pain;Neuropathic pain   Pain Onset More than a month ago   Pain Frequency Constant   Aggravating Factors  prolonged static positioning   Pain Relieving Factors heat, pain meds   Multiple Pain Sites Yes   Pain Score --  6.5/10   Pain Location Shoulder   Pain Orientation Right;Left   Pain Descriptors / Indicators Aching;Throbbing   Pain Type Chronic pain   Pain Radiating Towards neck   Pain Onset More than a month ago   Pain Frequency Constant   Aggravating Factors  unsure   Pain Relieving Factors heating pad, pain meds             Neuro re-ed: Pt performed standing balance in corner with chair in front of pt for safety. Dizziness incr. To 2.5/10 but pt reported it was toleratable but needed rest breaks 2/2 pain. Please see pt instructions for details. Pt performed IND, cues for new additions to HEP.             [facility] Adult PT Treatment/Exercise - 04/13/17 1424      Standardized Balance Assessment   Standardized Balance Assessment Timed Up and Go Test     Timed Up and Go Test   TUG Normal TUG   Normal TUG (seconds) 15.28  and 14.33 sec.  no AD, 15.18sec. and 12.73sec. with [facility]           Self Care:     PT Education - 04/13/17 1443    Education provided Yes   Education Details PT reviewed pt's balance HEP and progressed as tolerated. PT discussed the importance of performing stretches and holding for longer periods of time when spasms occur. PT provided pt with [facility] information regarding continuing therapy after pt's visits run out. PT asked pt to find R AFO, as pt reported he stopped wearing it 2/2 it "cutting into his ankle".    Person(s) Educated Patient   Methods Explanation;Handout;Demonstration;Verbal cues   Comprehension Verbalized understanding;Returned demonstration          PT Short Term Goals - 04/13/17 1446      PT SHORT TERM GOAL #1   Title Pt will be IND in HEP to  improve strength, balance, and flexibility. TARGET DATE FOR ALL STGS: after 4 treatment sessions   Baseline 9/25: all assessed have been met; will check corner balance next session   Status Partially Met     PT SHORT TERM GOAL #2   Title Pt will amb. 300' over even terrain with LRAD at MOD I level to improve safety during functional mobility.    Baseline S with SPC with quad tip 400'   Status Partially Met     PT SHORT TERM GOAL #3   Title Pt will perform TUG with LRAD in </=13.5 seconds to decr. falls risk.    Baseline 16.09 sec with SPC   Status Achieved           PT Long Term Goals - 02/10/17 1445      PT LONG TERM GOAL #1   Title Pt will verbalize understanding at fall prevention strategies to decr. falls risk. TARGET DATE FOR ALL LTGS: after 8 treatment visits   Baseline Unable to verbalize fall prevention strategies.    Status New     PT LONG TERM GOAL #2   Title Pt will improve DGI score to >/=20/24 to decr. falls risk.    Baseline 12/24   Status New     PT LONG TERM GOAL #3   Title Pt will improve gait speed with LRAD to >/=2.86f/sec. to safely amb. in the community.   [facility] Status New     PT LONG TERM GOAL #4   Title Pt will amb. 500' over even/uneven terrain with LRAD at MOD I level to improve safety during functional mobility.    Baseline 8025with S and [facility], even terrain   Status New               Plan - 04/13/17 1444    Clinical Impression Statement Pt demonstrated progress as he met STG 3 with [facility]. Pt was also able to tolerate progression of standing balance activities. Pt is limited by pain and dizziness (2.5/10 during balance HEP) and required standing and seated rest breaks. Pt would continue to benefit from skilled PT to improve safety during functional mobility.    PT Treatment/Interventions ADLs/Self Care Home Management;Biofeedback;Canalith Repostioning;Cryotherapy;Neuromuscular re-education;Balance  training;Therapeutic exercise;Therapeutic activities;Manual techniques;Functional mobility training;Stair training;Gait training;Orthotic Fit/Training;DME Instruction;Patient/family education;Vestibular   PT Next Visit Plan Gait with and without AD      Patient will benefit from skilled therapeutic intervention in order to improve the following deficits and impairments:  Abnormal gait, Decreased endurance, Decreased knowledge of use of DME, Decreased strength, Impaired UE functional use,  Pain, Decreased balance, Decreased mobility, Impaired tone, Decreased range of motion, Decreased coordination, Impaired flexibility, Postural dysfunction, Impaired sensation  Visit Diagnosis: Unsteadiness on feet  Other abnormalities of gait and mobility  Muscle weakness (generalized)     Problem List Patient Active Problem List   Diagnosis Date Noted  . Hypertension 02/17/2017  . Renal mass 01/04/2017  . Renal carcinoma, right ([place]) 11/23/2016  . Atherosclerosis of aorta ([place]) 11/23/2016  . 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 Acute blood loss anemia   . Dysesthesia   . History of syncope   . 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 Tetraparesis ([place])   . Central cord syndrome ([place])   . Anemia   . Renal cyst   . ETOH abuse   . Chest wall pain   . Neuropathic pain   . Neck pain   . Chronic neck and back pain 09/01/2016  . Alcohol intoxication ([place]) 09/01/2016  . Tobacco abuse 09/01/2016    Claudis Giovanelli L 04/13/2017, 2:46 PM  [facility] [address] [place], [place], [address] Phone: [phone]   Fax:  [phone]  Name: Patrick Romero MRN: [id number] Date of Birth: [birth year]  Geoffry Paradise, PT,DPT 04/13/17 2:48 PM Phone: [phone] Fax: [phone]

## 2017-04-13 NOTE — Patient Instructions (Addendum)
Hours of Operation and Contact Information Hours of Operation [facility] operates each Tuesday evening from 6pm-8pm unless otherwise stated. [facility] Phone: [phone] Clinic Fax: [phone] Clinic Email: [email or web address]  Interim Faculty Advisor Dr. Hosie Spangle, [email or web address] Clinic Administrators 3rd year: Rosezella Rumpf, [email or web address] 2nd year: Eduardo Osier, [email or web address] 1st year: Jeanett Schlein, [email or web address] Clinic Coordinators 3rd year: Monico Hoar, [email or web address] 2nd year: Alysia Penna, [email or web address] 1st year: Nicole Kindred, [email or web address] Student Coordinators 3rd year: Hinckley Sink, [email or web address] 2nd year: Quintella Reichert, [email or web address] 1st year: Tomasita Morrow, [email or web address] Clinician Coordinators 3rd year: Hilda Blades, [email or web address] 2nd year: Gerrit Halls, [email or web address] 1st year: Gardner Candle, [email or web address]  Perform in a corner with a chair in front of you OR at kitchen sink with chair behind you for safety:  Feet Together (Compliant Surface) Varied Arm Positions - Eyes Closed    Stand on compliant surface: ___pillows/cushion_____ with feet together and arms at your side. Close eyes and visualize upright position. Hold__30-45__ seconds. Repeat __3__ times per session. Do __1__ sessions per day.  Copyright  VHI. All rights reserved.  Feet Together (Compliant Surface) Head Motion - Eyes Open    With eyes open, standing on compliant surface: __pillows/cushion______, feet together, move head slowly: up and down 5 times and side to side 5 times. ADD: PERFORM DIAGONAL HEAD TURNS (RIGHT UPPER CORNER TO LEFT LOWER CORNER, AND THEN LEFT UPPER CORNER TO RIGHT LOWER CORNER).  Repeat __3__ times per session. Do __1__ sessions per day.  Copyright  VHI. All rights reserved.  Single Leg -  Eyes Open    Holding support, lift right leg while maintaining balance over other leg. Progress to removing hands from support surface for longer periods of time. Switch and perform with other leg. Hold_10-30___ seconds. Repeat __3__ times per session per leg. Do __1__ sessions per day.  Copyright  VHI. All rights reserved.

## 2017-04-13 NOTE — Therapy (Signed)
[facility] [address] [place], [place], [address] Phone: [phone]   Fax:  [phone]  Occupational Therapy Treatment  Patient Details  Name: Patrick Romero MRN: [id number] Date of Birth: [birth year] Referring Provider: Dr Naaman Plummer  Encounter Date: 04/13/2017      OT End of Session - 04/13/17 1557    Visit Number 7   Number of Visits 10   Date for OT Re-Evaluation 04/27/17   Authorization Type Medicaid -pt approved 9 visits plus eval by 04/27/2017   Authorization - Visit Number 7   Authorization - Number of Visits 10   OT Start Time 0254   OT Stop Time 1527   OT Time Calculation (min) 42 min   Activity Tolerance Patient tolerated treatment well   Behavior During Therapy [facility] for tasks assessed/performed      Past Medical History:  Diagnosis Date  . Arthritis   . Chronic back pain   . Chronic neck pain   . Depression   . Herniated disc, cervical   . Hypertension   . Sickle cell trait ([place])   . Stroke [facility])    weakness in right hand and leg , numbness on left     Past Surgical History:  Procedure Laterality Date  . ROBOTIC ASSITED PARTIAL NEPHRECTOMY Right 01/04/2017   Procedure: XI ROBOTIC ASSITED PARTIAL NEPHRECTOMY;  Surgeon: [name], MD;  Location: [facility];  Service: Urology;  Laterality: Right;    There were no vitals filed for this visit.      Subjective Assessment - 04/13/17 1444    Subjective  My back is really hurting - it always hurts- can't walk around with heat on all day   Pertinent History see epic  Pt s/p R CVA.  CHECK BP!!!   Patient Stated Goals I want my arm to work better and for it to hurt less   Currently in Pain? Yes   Pain Score 8    Pain Location Back   Pain Orientation Lower   Pain Descriptors / Indicators Aching   Pain Type Chronic pain   Pain Onset More than a month ago   Pain Frequency Constant   Aggravating Factors  prolonged standing   Pain Relieving  Factors heat   Multiple Pain Sites Yes   Pain Score 7   Pain Location Neck   Pain Orientation Right;Left   Pain Descriptors / Indicators Aching   Pain Type Chronic pain   Pain Relieving Factors heat, meds                      OT Treatments/Exercises (OP) - 04/13/17 0001      Neurological Re-education Exercises   Other Exercises 1 Neuromuscular reeducation to address mid reach.  Initial prep in supine mid toward high reach with pain and biomechanical considerations.  Followed up in standing with active assisted reach with UE Ranger.  Patient able to reach into chest height cabinet to place / retrieve 3 lb obj with right hand .       Modalities   Modalities Moist Heat     Moist Heat Therapy   Number Minutes Moist Heat 10 Minutes   Moist Heat Location Shoulder     Manual Therapy   Manual Therapy Soft tissue mobilization;Joint mobilization   Joint Mobilization Shoulder girdle right.  Patient with joint capsule tightness - limitations, best result in supine with joint compression throughout range.     Soft tissue mobilization Anterior  shoulder - pectoralis, posterior shoulder to increase dissociation between scapula and humerus                OT Education - 04/13/17 1557    Education provided Yes   Education Details Postural considerations for reach   Person(s) Educated Patient   Methods Explanation   Comprehension Verbalized understanding;Need further instruction          OT Short Term Goals - 04/04/17 1309      OT SHORT TERM GOAL #1   Title Patient will complete a home exercise program designed to improve right upper extremity range of motion with min assist - 03/27/2017   Status Achieved     OT SHORT TERM GOAL #2   Title Patient will complete a home exercise program designed to improve right hand strangth   Status Achieved     OT SHORT TERM GOAL #3   Title Patient will cut food using two hands with modified independence   Status Achieved      OT SHORT TERM GOAL #4   Title Patient will retrieve paper or coins from right pants pocket with right hand and increased time.   Status Achieved     OT SHORT TERM GOAL #5   Title Patient will don a more form fitting pullover shirt without assistance in less than one minute   Status Achieved           OT Long Term Goals - 04/13/17 1600      OT LONG TERM GOAL #1   Title Patient will complete an update home exercise program to address active range of motion thrughtout right UE - 04/27/2017   Baseline Patient is not currently able to complete an active range of motion exercise program due to shoulder tightness and pain (right)   Time 8   Period Weeks   Status On-going     OT LONG TERM GOAL #2   Title Patient will demonstrate sufficient active shoulder motion to assist right hand to obtain lightweight object from chest height shelf (80+ degrees of right shoulder flexion)    Baseline Patient currently has 60 degrees of shoulder flexion   Period Weeks   Status Achieved     OT LONG TERM GOAL #3   Title Patient will demonstrate ability to chop or peel vegetables with close supervision and adaptive equipment, or bilateral technique   Baseline Patient avoids any use of knives at this time due to hand pain, limited strength,a nd parasthesisas   Time 8   Period Weeks   Status Achieved     OT LONG TERM GOAL #4   Title Patient will demonstrate sufficient strength to hold a key,a nd turn a key in a lock to open a door, turn ignition using right hand   Baseline Patient lacks sufficient forearm motion, and pinch strength to effectively turn a key    Time 8   Period Weeks   Status Achieved     OT LONG TERM GOAL #5   Title Patient will button shirt, zip lightweight jacket using bilateral technique or adaptive equipment    Baseline Patient avoids buttons, zippers at this time   Time 8   Period Weeks   Status Achieved     OT LONG TERM GOAL #6   Title Patient will report pain no greater  than 4/10 in right shoulder following functional use as needed for dressing, or simple cooking task   Baseline Patient currently has constant pain ~ 6/10 in  right shoulder    Time 8   Period Weeks   Status On-going               Plan - 04/13/17 1558    Clinical Impression Statement Patient demonstrates improved range of motion - both active and passive in right shoulder, and improved motion and functional use of right hand.  Patient still reports significant pain and stiffness in back, neck and arms.     Rehab Potential Good   OT Frequency 2x / week   OT Duration 8 weeks   OT Treatment/Interventions Self-care/ADL training;Electrical Stimulation;Moist Heat;Fluidtherapy;Ultrasound;Therapeutic exercise;Neuromuscular education;Energy conservation;DME and/or AE instruction;Passive range of motion;Manual Therapy;Functional Mobility Training;Splinting;Therapeutic activities;Cognitive remediation/compensation;Patient/family education;Balance training   Plan manual therapy followed by NMR for reach, update HEP - Hand and arm.  Work toward reamining LTG's   Consulted and Agree with Plan of Care Patient      Patient will benefit from skilled therapeutic intervention in order to improve the following deficits and impairments:  Abnormal gait, Decreased activity tolerance, Decreased balance, Decreased cognition, Decreased coordination, Decreased range of motion, Decreased mobility, Decreased endurance, Decreased strength, Difficulty walking, Increased edema, Impaired UE functional use, Impaired tone, Impaired sensation, Impaired flexibility, Impaired perceived functional ability, Increased muscle spasms, Improper body mechanics, Improper spinal/pelvic alignment, Pain  Visit Diagnosis: Unsteadiness on feet  Muscle weakness (generalized)  Other lack of coordination  Stiffness of right shoulder, not elsewhere classified  Other disturbances of skin sensation  Pain in right hand  Chronic right  shoulder pain    Problem List Patient Active Problem List   Diagnosis Date Noted  . Hypertension 02/17/2017  . Renal mass 01/04/2017  . Renal carcinoma, right ([place]) 11/23/2016  . Atherosclerosis of aorta ([place]) 11/23/2016  . 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 Acute blood loss anemia   . Dysesthesia   . History of syncope   . 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 Tetraparesis ([place])   . Central cord syndrome ([place])   . Anemia   . Renal cyst   . ETOH abuse   . Chest wall pain   . Neuropathic pain   . Neck pain   . Chronic neck and back pain 09/01/2016  . Alcohol intoxication ([place]) 09/01/2016  . Tobacco abuse 09/01/2016    Mariah Milling, OTR/L 04/13/2017, 4:02 PM  [facility] [address] [place], [place], [address] Phone: [phone]   Fax:  [phone]  Name: Patrick Romero MRN: [id number] Date of Birth: 11-Jan-1961

## 2017-04-18 ENCOUNTER — Encounter: Payer: Self-pay | Admitting: Occupational Therapy

## 2017-04-18 ENCOUNTER — Ambulatory Visit: Payer: Medicaid Other | Admitting: Occupational Therapy

## 2017-04-18 DIAGNOSIS — R278 Other lack of coordination: Secondary | ICD-10-CM

## 2017-04-18 DIAGNOSIS — R2681 Unsteadiness on feet: Secondary | ICD-10-CM

## 2017-04-18 DIAGNOSIS — M79642 Pain in left hand: Secondary | ICD-10-CM

## 2017-04-18 DIAGNOSIS — M25611 Stiffness of right shoulder, not elsewhere classified: Secondary | ICD-10-CM

## 2017-04-18 DIAGNOSIS — M25511 Pain in right shoulder: Secondary | ICD-10-CM

## 2017-04-18 DIAGNOSIS — G8929 Other chronic pain: Secondary | ICD-10-CM

## 2017-04-18 DIAGNOSIS — R208 Other disturbances of skin sensation: Secondary | ICD-10-CM

## 2017-04-18 DIAGNOSIS — I69915 Cognitive social or emotional deficit following unspecified cerebrovascular disease: Secondary | ICD-10-CM

## 2017-04-18 DIAGNOSIS — M6281 Muscle weakness (generalized): Secondary | ICD-10-CM

## 2017-04-18 DIAGNOSIS — M79641 Pain in right hand: Secondary | ICD-10-CM

## 2017-04-18 NOTE — Therapy (Signed)
[facility] [address] [place], [place], [address] Phone: [phone]   Fax:  [phone]  Occupational Therapy Treatment  Patient Details  Name: Patrick Romero MRN: [id number] Date of Birth: 1960/11/04 Referring Provider: Dr Naaman Plummer  Encounter Date: 04/18/2017      OT End of Session - 04/18/17 1506    Visit Number 8   Number of Visits 10   Date for OT Re-Evaluation 04/27/17   Authorization Type Medicaid -pt approved 9 visits plus eval by 04/27/2017   Authorization - Visit Number 8   Authorization - Number of Visits 10   OT Start Time 1017   OT Stop Time 1059   OT Time Calculation (min) 42 min   Activity Tolerance Patient tolerated treatment well      Past Medical History:  Diagnosis Date  . Arthritis   . Chronic back pain   . Chronic neck pain   . Depression   . Herniated disc, cervical   . Hypertension   . Sickle cell trait ([place])   . Stroke [facility])    weakness in right hand and leg , numbness on left     Past Surgical History:  Procedure Laterality Date  . ROBOTIC ASSITED PARTIAL NEPHRECTOMY Right 01/04/2017   Procedure: XI ROBOTIC ASSITED PARTIAL NEPHRECTOMY;  Surgeon: [name], MD;  Location: [facility];  Service: Urology;  Laterality: Right;    There were no vitals filed for this visit.      Subjective Assessment - 04/18/17 1455    Subjective  I am stiff and achy today   Pertinent History central cord syndrome   Patient Stated Goals I want my arm to work better and for it to hurt less   Currently in Pain? Yes  overall general "achyness" no pain in shoulder joints   Pain Score 6    Pain Descriptors / Indicators Aching   Pain Type Chronic pain   Pain Onset More than a month ago   Pain Frequency Constant   Aggravating Factors  the cold, I just feel achy all the time   Pain Relieving Factors heat, exercise therapy                      OT Treatments/Exercises (OP) -  04/18/17 0001      Neurological Re-education Exercises   Other Exercises 1 Neuro re ed following manual therapy with wall glides using BUE's - pt able to demonstrate improved AROM in closed chain wall glides. Pt instructed in how to do and issued as part of HEP.Marland Kitchen Pt can also use as stretching technique throughout the day. Pt able to return demostrate.  ALso addressed functional unilateral and bilateral overhead reach in supine with good results.  Pt with overall improving functional reach despite overall "achyness and stifffness."     Manual Therapy   Manual Therapy Soft tissue mobilization;Joint mobilization;Scapular mobilization   Joint Mobilization soft tissue, joint and scap mob to decrease tightness, improve alignment and improve isolated movement.  Pt at this point denies any true joint pain in R shoulder but pt with decreased AROM/functional reach due to malalgiment and tightness through out shoulder girdle.                  OT Education - 04/18/17 1504    Education provided Yes   Education Details upgraded HEP   Person(s) Educated Patient   Methods Explanation;Demonstration;Handout;Verbal cues   Comprehension Verbalized understanding;Returned demonstration  OT Short Term Goals - 04/18/17 1504      OT SHORT TERM GOAL #1   Title Patient will complete a home exercise program designed to improve right upper extremity range of motion with min assist - 03/27/2017   Status Achieved     OT SHORT TERM GOAL #2   Title Patient will complete a home exercise program designed to improve right hand strangth   Status Achieved     OT SHORT TERM GOAL #3   Title Patient will cut food using two hands with modified independence   Status Achieved     OT SHORT TERM GOAL #4   Title Patient will retrieve paper or coins from right pants pocket with right hand and increased time.   Status Achieved     OT SHORT TERM GOAL #5   Title Patient will don a more form fitting pullover  shirt without assistance in less than one minute   Status Achieved           OT Long Term Goals - 04/18/17 1504      OT LONG TERM GOAL #1   Title Patient will complete an update home exercise program to address active range of motion thrughtout right UE - 04/27/2017   Baseline Patient is not currently able to complete an active range of motion exercise program due to shoulder tightness and pain (right)   Time 8   Period Weeks   Status Achieved     OT LONG TERM GOAL #2   Title Patient will demonstrate sufficient active shoulder motion to assist right hand to obtain lightweight object from chest height shelf (80+ degrees of right shoulder flexion)    Baseline Patient currently has 60 degrees of shoulder flexion   Period Weeks   Status Achieved     OT LONG TERM GOAL #3   Title Patient will demonstrate ability to chop or peel vegetables with close supervision and adaptive equipment, or bilateral technique   Baseline Patient avoids any use of knives at this time due to hand pain, limited strength,a nd parasthesisas   Time 8   Period Weeks   Status Achieved     OT LONG TERM GOAL #4   Title Patient will demonstrate sufficient strength to hold a key,a nd turn a key in a lock to open a door, turn ignition using right hand   Baseline Patient lacks sufficient forearm motion, and pinch strength to effectively turn a key    Time 8   Period Weeks   Status Achieved     OT LONG TERM GOAL #5   Title Patient will button shirt, zip lightweight jacket using bilateral technique or adaptive equipment    Baseline Patient avoids buttons, zippers at this time   Time 8   Period Weeks   Status Achieved     OT LONG TERM GOAL #6   Title Patient will report pain no greater than 4/10 in right shoulder following functional use as needed for dressing, or simple cooking task   Baseline Patient currently has constant pain ~ 6/10 in right shoulder    Time 8   Period Weeks   Status On-going                Plan - 04/18/17 1505    Clinical Impression Statement Pt progressing toward goals.  Pt reporting no true joint pain in shoulder just overall general dull ache in body - pt to follow up with MD.    Rehab Potential  Good   OT Frequency 2x / week   OT Duration 8 weeks   OT Treatment/Interventions Self-care/ADL training;Electrical Stimulation;Moist Heat;Fluidtherapy;Ultrasound;Therapeutic exercise;Neuromuscular education;Energy conservation;DME and/or AE instruction;Passive range of motion;Manual Therapy;Functional Mobility Training;Splinting;Therapeutic activities;Cognitive remediation/compensation;Patient/family education;Balance training   Plan NMR for reach, functional use of BUE's in sitting and standing, continue to update HEP as appropriate, hand and arm.  Work toward remaining LTG   Consulted and Agree with Plan of Care Patient      Patient will benefit from skilled therapeutic intervention in order to improve the following deficits and impairments:  Abnormal gait, Decreased activity tolerance, Decreased balance, Decreased cognition, Decreased coordination, Decreased range of motion, Decreased mobility, Decreased endurance, Decreased strength, Difficulty walking, Increased edema, Impaired UE functional use, Impaired tone, Impaired sensation, Impaired flexibility, Impaired perceived functional ability, Increased muscle spasms, Improper body mechanics, Improper spinal/pelvic alignment, Pain  Visit Diagnosis: Unsteadiness on feet  Muscle weakness (generalized)  Stiffness of right shoulder, not elsewhere classified  Other lack of coordination  Other disturbances of skin sensation  Pain in right hand  Chronic right shoulder pain  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 Hypertension 02/17/2017  . Renal mass 01/04/2017  . Renal  carcinoma, right ([place]) 11/23/2016  . Atherosclerosis of aorta ([place]) 11/23/2016  . 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 Acute blood loss anemia   . Dysesthesia   . History of syncope   . 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 Tetraparesis ([place])   . Central cord syndrome ([place])   . Anemia   . Renal cyst   . ETOH abuse   . Chest wall pain   . Neuropathic pain   . Neck pain   . Chronic neck and back pain 09/01/2016  . Alcohol intoxication ([place]) 09/01/2016  . Tobacco abuse 09/01/2016    Quay Burow, OTR/L 04/18/2017, 3:08 PM  [facility] [address] [place], [place], [address] Phone: [phone]   Fax:  [phone]  Name: RAEF SPRIGG MRN: [id number] Date of Birth: 1960/07/14

## 2017-04-18 NOTE — Patient Instructions (Signed)
Add this to your home program for your arms: Start with one time per day and work toward two times per day.  KEEP YOUR HEAD AND SHOULDERS RELAXED!!!  Wall slides; Stand facing the wall with palms flat on the wall and elbows bent.  Stand tall - make sure you use make your tummy muscles active!! Slowly and gently slide your hands up the wall as far as you can. You will get a little further with your left arm than your right and that is ok.  This should NOT be painful but should feel like a stretch. Return to starting position.  Do 10.    You can also use this stretch through out the day if you notice that your shoulders are tight.

## 2017-04-20 ENCOUNTER — Ambulatory Visit: Payer: Medicaid Other

## 2017-04-20 ENCOUNTER — Ambulatory Visit: Payer: Medicaid Other | Admitting: Occupational Therapy

## 2017-04-24 ENCOUNTER — Ambulatory Visit: Payer: Medicaid Other | Admitting: Occupational Therapy

## 2017-04-24 ENCOUNTER — Telehealth: Payer: Self-pay | Admitting: Physical Therapy

## 2017-04-24 ENCOUNTER — Encounter: Payer: Self-pay | Admitting: Occupational Therapy

## 2017-04-24 ENCOUNTER — Ambulatory Visit: Payer: Medicaid Other | Admitting: Physical Therapy

## 2017-04-24 DIAGNOSIS — M25511 Pain in right shoulder: Secondary | ICD-10-CM

## 2017-04-24 DIAGNOSIS — I69915 Cognitive social or emotional deficit following unspecified cerebrovascular disease: Secondary | ICD-10-CM

## 2017-04-24 DIAGNOSIS — R278 Other lack of coordination: Secondary | ICD-10-CM

## 2017-04-24 DIAGNOSIS — G8929 Other chronic pain: Secondary | ICD-10-CM

## 2017-04-24 DIAGNOSIS — M25611 Stiffness of right shoulder, not elsewhere classified: Secondary | ICD-10-CM

## 2017-04-24 DIAGNOSIS — M6281 Muscle weakness (generalized): Secondary | ICD-10-CM

## 2017-04-24 DIAGNOSIS — R2689 Other abnormalities of gait and mobility: Secondary | ICD-10-CM

## 2017-04-24 DIAGNOSIS — R2681 Unsteadiness on feet: Secondary | ICD-10-CM

## 2017-04-24 DIAGNOSIS — M79642 Pain in left hand: Secondary | ICD-10-CM

## 2017-04-24 DIAGNOSIS — R208 Other disturbances of skin sensation: Secondary | ICD-10-CM

## 2017-04-24 DIAGNOSIS — M79641 Pain in right hand: Secondary | ICD-10-CM

## 2017-04-24 NOTE — Telephone Encounter (Signed)
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  He would need a face to face visit with you - with you stating in your office visit note the reason he needs it and then place order for it in Epic, if you agree.  He is scheduled to see you next week.  Thank you so much - I really hope he can get an AFO  Guido Sander, PT

## 2017-04-24 NOTE — Patient Instructions (Signed)
Hamstring Curl: Resisted (Prone)    Anchor behind, with tubing on left ankle, leg straight, bend knee. Repeat __15__ times per set. Do __3__ sets per session. Do 1-2____ sessions per day.  http://orth.exer.us/670   Copyright  VHI. All rights reserved.

## 2017-04-24 NOTE — Therapy (Signed)
[facility] [address] [place], [place], [address] Phone: [phone]   Fax:  [phone]  Physical Therapy Treatment  Patient Details  Name: Patrick Romero MRN: [id number] Date of Birth: 1961-06-12 Referring Provider: Dr. Naaman Plummer  Encounter Date: 04/24/2017      PT End of Session - 04/24/17 1309    Visit Number 7   Number of Visits 9   Date for PT Re-Evaluation 04/27/17   Authorization Type MCD: 9 visits approved thru 04/27/17   PT Start Time 0935   PT Stop Time 1018   PT Time Calculation (min) 43 min      Past Medical History:  Diagnosis Date  . Arthritis   . Chronic back pain   . Chronic neck pain   . Depression   . Herniated disc, cervical   . Hypertension   . Sickle cell trait ([place])   . Stroke [name])    weakness in right hand and leg , numbness on left     Past Surgical History:  Procedure Laterality Date  . ROBOTIC ASSITED PARTIAL NEPHRECTOMY Right 01/04/2017   Procedure: XI ROBOTIC ASSITED PARTIAL NEPHRECTOMY;  Surgeon: [name], MD;  Location: [facility];  Service: Urology;  Laterality: Right;    There were no vitals filed for this visit.      Subjective Assessment - 04/24/17 0933    Subjective Pt reports he tripped and fell over the weekend - tripped on a throw rug in hallway when it was dark without power; landed on right shoulder awkardly but did not get hurt;   Pertinent History R renal carcinoma, atherosclerosis of aorta, central cord syndrom s/p fall, R shoulder adhesive capsulitis, ETOH, nephrectomy on 01/04/17 (R kidney)   Patient Stated Goals "get my leg strength back, walk normal."   Currently in Pain? Yes   Pain Score 7   overall generalized, achy pain                       [facility] Adult PT Treatment/Exercise - 04/24/17 1245      Transfers   Transfers Sit to Stand   Sit to Stand 5: Supervision   Number of Reps Other reps (comment)  5     Ambulation/Gait   Ambulation/Gait Yes   Ambulation/Gait Assistance 5: Supervision   Ambulation/Gait Assistance Details Walk On AFO trialed on RLE   Ambulation Distance (Feet) 500 Feet   Assistive device Straight cane  with quad base   Gait Pattern Step-through pattern;Decreased hip/knee flexion - right;Decreased dorsiflexion - right;Decreased trunk rotation   Ambulation Surface Level;Unlevel;Indoor;Outdoor;Paved     Standardized Balance Assessment   Standardized Balance Assessment Timed Up and Go Test     Dynamic Gait Index   Level Surface Mild Impairment   Change in Gait Speed Moderate Impairment   Gait with Horizontal Head Turns Mild Impairment   Gait with Vertical Head Turns Mild Impairment   Gait and Pivot Turn Moderate Impairment   Step Over Obstacle Moderate Impairment   Step Around Obstacles Mild Impairment   Steps Mild Impairment   Total Score 13     Timed Up and Go Test   Normal TUG (seconds) 11.22  with AFO and SPC      Knee/Hip Exercises: Stretches   Active Hamstring Stretch Right;1 rep;60 seconds  Runner's stretch   Gastroc Stretch Right;1 rep;30 seconds  2" block used     Knee/Hip Exercises: Prone   Hamstring Curl 1 set;10 reps  Other Prone Exercises Rt knee flexion prone with red theraband x 10 reps   Other Prone Exercises Pt performed Rt knee flexion (at 90 degrees) with hip extension x 10 reps  in prone position                  PT Short Term Goals - 04/13/17 1446      PT SHORT TERM GOAL #1   Title Pt will be IND in HEP to improve strength, balance, and flexibility. TARGET DATE FOR ALL STGS: after 4 treatment sessions   Baseline 9/25: all assessed have been met; will check corner balance next session   Status Partially Met     PT SHORT TERM GOAL #2   Title Pt will amb. 300' over even terrain with LRAD at MOD I level to improve safety during functional mobility.    Baseline S with SPC with quad tip 400'   Status Partially Met     PT SHORT TERM GOAL #3    Title Pt will perform TUG with LRAD in </=13.5 seconds to decr. falls risk.    Baseline 16.09 sec with SPC   Status Achieved           PT Long Term Goals - 04/24/17 1248      PT LONG TERM GOAL #1   Title Pt will verbalize understanding at fall prevention strategies to decr. falls risk. TARGET DATE FOR ALL LTGS: after 8 treatment visits   Baseline met 04-24-17   Status Achieved     PT LONG TERM GOAL #2   Title Pt will improve DGI score to >/=20/24 to decr. falls risk.    Baseline 13/24   Status Not Met     PT LONG TERM GOAL #3   Title Pt will improve gait speed with LRAD to >/=2.48f/sec. to safely amb. in the community.   Baseline 12.41, 13.47 = 2.64, 2.44 ft/sec with cane- pt unable to maintain this speed due to decreased muscle endurance in RLE - incr. Rt foot drap and decr. clearance with fatigue   Status Partially Met     PT LONG TERM GOAL #4   Title Pt will amb. 500' over even/uneven terrain with LRAD at MOD I level to improve safety during functional mobility.    Baseline Pt reports amb. at least 500' with [facility] with [facility]- states his Rt leg gets tired easily and begins to drag   Status Not Met               Plan - 04/24/17 1256    Clinical Impression Statement Pt has met LTG #1 and partially met LTG #3:  LTG's #2 and 4 not met due to RLE weakness, decreased muscle endurance, gait deviaitons due to Rt foot drag and balance deficits.  Gait significantly improved with use of AFO on RLE with increased foot clearance and incr. safety with gait noted with pt able to amb. prolonged distance without Rt foot dragging with use of AFO.  Pt reported he felt much more stable and confident with use of Rt AFO and able to increase gait speed without Rt foot scuffing floor.  Rehab Potential Good   PT Treatment/Interventions ADLs/Self Care  Home Management;Biofeedback;Canalith Repostioning;Cryotherapy;Neuromuscular re-education;Balance training;Therapeutic exercise;Therapeutic activities;Manual techniques;Functional mobility training;Stair training;Gait training;Orthotic Fit/Training;DME Instruction;Patient/family education;Vestibular   PT Next Visit Plan N/A - D/C on 04-24-17 due to end of authorized visits   Consulted and Agree with Plan of Care Patient      Patient will benefit from skilled therapeutic intervention in order to improve the following deficits and impairments:  Abnormal gait, Decreased endurance, Decreased knowledge of use of DME, Decreased strength, Impaired UE functional use, Pain, Decreased balance, Decreased mobility, Impaired tone, Decreased range of motion, Decreased coordination, Impaired flexibility, Postural dysfunction, Impaired sensation  Visit Diagnosis: Muscle weakness (generalized)  Other abnormalities of gait and mobility     Problem List Patient Active Problem List   Diagnosis Date Noted  . Hypertension 02/17/2017  . Renal mass 01/04/2017  . Renal carcinoma, right ([place]) 11/23/2016  . Atherosclerosis of aorta ([place]) 11/23/2016  . 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 Acute blood loss anemia   . Dysesthesia   . History of syncope   . 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 Tetraparesis ([place])   . Central cord syndrome ([place])   . Anemia   . Renal cyst   . ETOH abuse   . Chest wall pain   . Neuropathic pain   . Neck pain   . Chronic neck and back pain 09/01/2016  . Alcohol intoxication ([place]) 09/01/2016  . Tobacco abuse 09/01/2016   PHYSICAL THERAPY DISCHARGE SUMMARY  Visits from Start of Care:  7  Current functional level related to goals / functional outcomes: See above for progress towards goals - pt has met LTG #1; LTG's # 2-4 not met   Remaining deficits: Cont. Decreased strength in bil. LE's with RLE weaker than LLE Cont. Decreased balance and decreased  independence and safety with ambulation  Education / Equipment: Pt has been instructed in a HEP for RLE strengthening and stretching Plan: Patient agrees to discharge.  Patient goals were not met. Patient is being discharged due to financial reasons.  ?????        Pt has no additional visits authorized with Medicaid as of today's visit   Anairis Knick, [place], PT 04/24/2017, 5:10 PM  [facility] [address] [place], [place], [address] Phone: [phone]   Fax:  [phone]  Name: MARLO GOODRICH MRN: [id number] Date of Birth: 11/04/1960

## 2017-04-24 NOTE — Therapy (Signed)
[facility] [address] [place], [place], [address] Phone: [phone]   Fax:  [phone]  Occupational Therapy Treatment  Patient Details  Name: Patrick Romero MRN: [id number] Date of Birth: April 29, 1961 Referring Provider: Dr Naaman Plummer  Encounter Date: 04/24/2017      OT End of Session - 04/24/17 1223    Visit Number 9   Number of Visits 10   Date for OT Re-Evaluation 04/27/17   Authorization Type Medicaid -pt approved 9 visits plus eval by 04/27/2017   Authorization - Visit Number 9   Authorization - Number of Visits 10   OT Start Time [id number]  pt arrived late   OT Stop Time 0930   OT Time Calculation (min) 27 min   Activity Tolerance Patient tolerated treatment well      Past Medical History:  Diagnosis Date  . Arthritis   . Chronic back pain   . Chronic neck pain   . Depression   . Herniated disc, cervical   . Hypertension   . Sickle cell trait ([place])   . Stroke [facility])    weakness in right hand and leg , numbness on left     Past Surgical History:  Procedure Laterality Date  . ROBOTIC ASSITED PARTIAL NEPHRECTOMY Right 01/04/2017   Procedure: XI ROBOTIC ASSITED PARTIAL NEPHRECTOMY;  Surgeon: [name], MD;  Location: [facility];  Service: Urology;  Laterality: Right;    There were no vitals filed for this visit.      Subjective Assessment - 04/24/17 0905    Subjective  I fell - I tripped over a throw rug.  I didn't get hurt.     Pertinent History central cord syndrome   Patient Stated Goals I want my arm to work better and for it to hurt less   Currently in Pain? Yes   Pain Score 7   overall achy pain neck, shoulders and back (not specific shoulder joint pain)   Pain Descriptors / Indicators Aching   Pain Type Chronic pain   Pain Onset More than a month ago   Pain Frequency Constant   Aggravating Factors  the cold, rain, I just feel achy all the time   Pain Relieving Factors heat, execise,  therapy                      OT Treatments/Exercises (OP) - 04/24/17 0001      Neurological Re-education Exercises   Other Exercises 1 Neuro re ed in supine with closed chain overhead bilateral reach - pt with consistently increasing range. Pt describes overall achiness however reports that he no longer feels joint pain in either shoulder.  Transitioned into sitting to address unilateral reach as well. Pt states that he is able to do "pretty much everything at home now it just takes longer"     Manual Therapy   Manual Therapy Soft tissue mobilization;Scapular mobilization   Joint Mobilization soft tissue and scap mob to address alignment and decrease tightness/discomfort in neck and B shoulders prior to neuro re ed.                   OT Short Term Goals - 04/24/17 1221      OT SHORT TERM GOAL #1   Title Patient will complete a home exercise program designed to improve right upper extremity range of motion with min assist - 03/27/2017   Status Achieved     OT SHORT TERM GOAL #2  Title Patient will complete a home exercise program designed to improve right hand strangth   Status Achieved     OT SHORT TERM GOAL #3   Title Patient will cut food using two hands with modified independence   Status Achieved     OT SHORT TERM GOAL #4   Title Patient will retrieve paper or coins from right pants pocket with right hand and increased time.   Status Achieved     OT SHORT TERM GOAL #5   Title Patient will don a more form fitting pullover shirt without assistance in less than one minute   Status Achieved           OT Long Term Goals - 04/24/17 1221      OT LONG TERM GOAL #1   Title Patient will complete an update home exercise program to address active range of motion thrughtout right UE - 04/27/2017   Baseline Patient is not currently able to complete an active range of motion exercise program due to shoulder tightness and pain (right)   Time 8   Period  Weeks   Status Achieved     OT LONG TERM GOAL #2   Title Patient will demonstrate sufficient active shoulder motion to assist right hand to obtain lightweight object from chest height shelf (80+ degrees of right shoulder flexion)    Baseline Patient currently has 60 degrees of shoulder flexion   Period Weeks   Status Achieved     OT LONG TERM GOAL #3   Title Patient will demonstrate ability to chop or peel vegetables with close supervision and adaptive equipment, or bilateral technique   Baseline Patient avoids any use of knives at this time due to hand pain, limited strength,a nd parasthesisas   Time 8   Period Weeks   Status Achieved     OT LONG TERM GOAL #4   Title Patient will demonstrate sufficient strength to hold a key,a nd turn a key in a lock to open a door, turn ignition using right hand   Baseline Patient lacks sufficient forearm motion, and pinch strength to effectively turn a key    Time 8   Period Weeks   Status Achieved     OT LONG TERM GOAL #5   Title Patient will button shirt, zip lightweight jacket using bilateral technique or adaptive equipment    Baseline Patient avoids buttons, zippers at this time   Time 8   Period Weeks   Status Achieved     OT LONG TERM GOAL #6   Title Patient will report pain no greater than 4/10 in right shoulder following functional use as needed for dressing, or simple cooking task   Baseline Patient currently has constant pain ~ 6/10 in right shoulder    Time 8   Period Weeks   Status On-going               Plan - 04/24/17 1222    Clinical Impression Statement Pt progressing toward goals. Pt remains with overall general discomfort however reach in BUE's greatly improved.   Rehab Potential Good   OT Frequency 2x / week   OT Duration 8 weeks   OT Treatment/Interventions Self-care/ADL training;Electrical Stimulation;Moist Heat;Fluidtherapy;Ultrasound;Therapeutic exercise;Neuromuscular education;Energy conservation;DME  and/or AE instruction;Passive range of motion;Manual Therapy;Functional Mobility Training;Splinting;Therapeutic activities;Cognitive remediation/compensation;Patient/family education;Balance training   Plan NMR for reach, functional use of BUE's in sitting and standing, continue to update HEP as appropriate, hand and arm.   Consulted and Agree with Plan  of Care Patient      Patient will benefit from skilled therapeutic intervention in order to improve the following deficits and impairments:  Abnormal gait, Decreased activity tolerance, Decreased balance, Decreased cognition, Decreased coordination, Decreased range of motion, Decreased mobility, Decreased endurance, Decreased strength, Difficulty walking, Increased edema, Impaired UE functional use, Impaired tone, Impaired sensation, Impaired flexibility, Impaired perceived functional ability, Increased muscle spasms, Improper body mechanics, Improper spinal/pelvic alignment, Pain  Visit Diagnosis: Unsteadiness on feet  Muscle weakness (generalized)  Stiffness of right shoulder, not elsewhere classified  Other lack of coordination  Other disturbances of skin sensation  Pain in right hand  Chronic right shoulder pain  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 Hypertension 02/17/2017  . Renal mass 01/04/2017  . Renal carcinoma, right ([place]) 11/23/2016  . Atherosclerosis of aorta ([place]) 11/23/2016  . 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 Acute blood loss anemia   . Dysesthesia   . History of syncope   . 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 Tetraparesis ([place])   . Central cord syndrome ([place])   . Anemia   . Renal cyst   . ETOH abuse   . Chest wall pain   . Neuropathic pain   . Neck pain   . Chronic neck and back pain 09/01/2016  . Alcohol intoxication ([place]) 09/01/2016  .  Tobacco abuse 09/01/2016    Quay Burow, OTR/L 04/24/2017, 12:25 PM  [facility] [address] [place], [place], [address] Phone: [phone]   Fax:  [phone]  Name: Patrick Romero MRN: [id number] Date of Birth: 03-Jan-1961

## 2017-04-25 ENCOUNTER — Ambulatory Visit: Payer: Medicaid Other | Admitting: Occupational Therapy

## 2017-04-25 ENCOUNTER — Encounter: Payer: Self-pay | Admitting: Occupational Therapy

## 2017-04-25 DIAGNOSIS — R208 Other disturbances of skin sensation: Secondary | ICD-10-CM

## 2017-04-25 DIAGNOSIS — M79642 Pain in left hand: Secondary | ICD-10-CM

## 2017-04-25 DIAGNOSIS — M79641 Pain in right hand: Secondary | ICD-10-CM

## 2017-04-25 DIAGNOSIS — I69915 Cognitive social or emotional deficit following unspecified cerebrovascular disease: Secondary | ICD-10-CM

## 2017-04-25 DIAGNOSIS — G8929 Other chronic pain: Secondary | ICD-10-CM

## 2017-04-25 DIAGNOSIS — R2681 Unsteadiness on feet: Secondary | ICD-10-CM | POA: Diagnosis not present

## 2017-04-25 DIAGNOSIS — R278 Other lack of coordination: Secondary | ICD-10-CM

## 2017-04-25 DIAGNOSIS — M6281 Muscle weakness (generalized): Secondary | ICD-10-CM

## 2017-04-25 DIAGNOSIS — M25511 Pain in right shoulder: Secondary | ICD-10-CM

## 2017-04-25 DIAGNOSIS — M25611 Stiffness of right shoulder, not elsewhere classified: Secondary | ICD-10-CM

## 2017-04-25 NOTE — Therapy (Signed)
[facility] [address] [place], [place], [address] Phone: [phone]   Fax:  [phone]  Occupational Therapy Treatment  Patient Details  Name: Patrick Romero MRN: [id number] Date of Birth: [birth year] Referring Provider: Dr Naaman Plummer  Encounter Date: 04/25/2017      OT End of Session - 04/25/17 1638    Visit Number 10   Number of Visits 10   Date for OT Re-Evaluation 04/27/17   Authorization Type Medicaid -pt approved 9 visits plus eval by 04/27/2017   Authorization - Visit Number 10   Authorization - Number of Visits 10   OT Start Time 0947  pt arrived late   OT Stop Time 1531   OT Time Calculation (min) 36 min   Activity Tolerance Patient tolerated treatment well      Past Medical History:  Diagnosis Date  . Arthritis   . Chronic back pain   . Chronic neck pain   . Depression   . Herniated disc, cervical   . Hypertension   . Sickle cell trait ([place])   . Stroke [facility])    weakness in right hand and leg , numbness on left     Past Surgical History:  Procedure Laterality Date  . ROBOTIC ASSITED PARTIAL NEPHRECTOMY Right 01/04/2017   Procedure: XI ROBOTIC ASSITED PARTIAL NEPHRECTOMY;  Surgeon: [name], MD;  Location: [facility];  Service: Urology;  Laterality: Right;    There were no vitals filed for this visit.      Subjective Assessment - 04/25/17 1457    Subjective  I think this is my last OT appointment   Pertinent History central cord syndrome   Patient Stated Goals I want my arm to work better and for it to hurt less   Currently in Pain? Yes   Pain Score 7   overall achiness   Pain Location --  overall - back , neck, shoulders its every where   Pain Descriptors / Indicators Aching   Pain Type Chronic pain   Pain Onset More than a month ago   Pain Frequency Constant   Aggravating Factors  the cold, rain, I just feel achy all the time   Pain Relieving Factors heat, exercise and therapy    Multiple Pain Sites No                      OT Treatments/Exercises (OP) - 04/25/17 0001      Neurological Re-education Exercises   Other Exercises 1 Neuro re ed in supine to address bilateral overhead reach in closed chain activity - pt with greatly improved ROM of both shoulders and reports that he can now do "pretty much everything at home I am just slower."  Transitioned into standing to address bilateral reach overhead with wall slides - pt able to achieve WFL's for LUE and approximately 120* with RUE. In open chain reach, pt able to achieve approximately 100* for functional use.      Manual Therapy   Manual Therapy Soft tissue mobilization;Scapular mobilization   Manual therapy comments soft tissue and scapular mob to address tightness and pain in B shoulders, neck prior to neuro re ed                  OT Short Term Goals - 04/25/17 1637      OT SHORT TERM GOAL #1   Title Patient will complete a home exercise program designed to improve right upper extremity range of  motion with min assist - 03/27/2017   Status Achieved     OT SHORT TERM GOAL #2   Title Patient will complete a home exercise program designed to improve right hand strangth   Status Achieved     OT SHORT TERM GOAL #3   Title Patient will cut food using two hands with modified independence   Status Achieved     OT SHORT TERM GOAL #4   Title Patient will retrieve paper or coins from right pants pocket with right hand and increased time.   Status Achieved     OT SHORT TERM GOAL #5   Title Patient will don a more form fitting pullover shirt without assistance in less than one minute   Status Achieved           OT Long Term Goals - 04/25/17 1637      OT LONG TERM GOAL #1   Title Patient will complete an update home exercise program to address active range of motion thrughtout right UE - 04/27/2017   Baseline Patient is not currently able to complete an active range of motion  exercise program due to shoulder tightness and pain (right)   Time 8   Period Weeks   Status Achieved     OT LONG TERM GOAL #2   Title Patient will demonstrate sufficient active shoulder motion to assist right hand to obtain lightweight object from chest height shelf (80+ degrees of right shoulder flexion)    Baseline Patient currently has 60 degrees of shoulder flexion   Period Weeks   Status Achieved     OT LONG TERM GOAL #3   Title Patient will demonstrate ability to chop or peel vegetables with close supervision and adaptive equipment, or bilateral technique   Baseline Patient avoids any use of knives at this time due to hand pain, limited strength,a nd parasthesisas   Time 8   Period Weeks   Status Achieved     OT LONG TERM GOAL #4   Title Patient will demonstrate sufficient strength to hold a key,a nd turn a key in a lock to open a door, turn ignition using right hand   Baseline Patient lacks sufficient forearm motion, and pinch strength to effectively turn a key    Time 8   Period Weeks   Status Achieved     OT LONG TERM GOAL #5   Title Patient will button shirt, zip lightweight jacket using bilateral technique or adaptive equipment    Baseline Patient avoids buttons, zippers at this time   Time 8   Period Weeks   Status Achieved     OT LONG TERM GOAL #6   Title Patient will report pain no greater than 4/10 in right shoulder following functional use as needed for dressing, or simple cooking task   Baseline Patient currently has constant pain ~ 6/10 in right shoulder    Time 8   Period Weeks   Status Achieved               Plan - 04/25/17 1638    Clinical Impression Statement Pt has met all goals and is ready for discharge. Pt has made significant gains in functional use of BUE's   Rehab Potential Good   OT Frequency 2x / week   OT Duration 8 weeks   OT Treatment/Interventions Self-care/ADL training;Electrical Stimulation;Moist  Heat;Fluidtherapy;Ultrasound;Therapeutic exercise;Neuromuscular education;Energy conservation;DME and/or AE instruction;Passive range of motion;Manual Therapy;Functional Mobility Training;Splinting;Therapeutic activities;Cognitive remediation/compensation;Patient/family education;Balance training   Plan d/c from  OT   Consulted and Agree with Plan of Care Patient      Patient will benefit from skilled therapeutic intervention in order to improve the following deficits and impairments:  Abnormal gait, Decreased activity tolerance, Decreased balance, Decreased cognition, Decreased coordination, Decreased range of motion, Decreased mobility, Decreased endurance, Decreased strength, Difficulty walking, Increased edema, Impaired UE functional use, Impaired tone, Impaired sensation, Impaired flexibility, Impaired perceived functional ability, Increased muscle spasms, Improper body mechanics, Improper spinal/pelvic alignment, Pain  Visit Diagnosis: Muscle weakness (generalized)  Stiffness of right shoulder, not elsewhere classified  Unsteadiness on feet  Other lack of coordination  Other disturbances of skin sensation  Pain in right hand  Chronic right shoulder pain  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 Hypertension 02/17/2017  . Renal mass 01/04/2017  . Renal carcinoma, right ([place]) 11/23/2016  . Atherosclerosis of aorta ([place]) 11/23/2016  . 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 Acute blood loss anemia   . Dysesthesia   . History of syncope   . 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 Tetraparesis ([place])   . Central cord syndrome ([place])   . Anemia   . Renal cyst   . ETOH abuse   . Chest wall pain   . Neuropathic pain   . Neck pain   . Chronic neck and back pain 09/01/2016  . Alcohol intoxication ([place]) 09/01/2016  . Tobacco  abuse 09/01/2016   OCCUPATIONAL THERAPY DISCHARGE SUMMARY  Visits from Start of Care: 10 (pt approved for eval plus 9 visits from [facility])  Current functional level related to goals / functional outcomes: See above   Remaining deficits: Decreased ROM of RUE, decreased strength, decreased balance, generalized pain   Education / Equipment: HEP  Plan: Patient agrees to discharge.  Patient goals were met. Patient is being discharged due to meeting the stated rehab goals.  ?????       Qu[place] Burow, OTR/L 04/25/2017, 4:40 PM  [facility] [address] [place], [place], [address] Phone: [phone]   Fax:  [phone]  Name: Patrick Romero MRN: [id number] Date of Birth: [birth year]

## 2017-04-26 NOTE — Telephone Encounter (Signed)
Patient is scheduled next Wednesday with you (Dr. Naaman Plummer).

## 2017-04-26 NOTE — Telephone Encounter (Signed)
We'll take care of it when he's in for follo wup. When is he scheduled?

## 2017-05-03 ENCOUNTER — Encounter: Payer: Medicaid Other | Attending: Physical Medicine & Rehabilitation | Admitting: Physical Medicine & Rehabilitation

## 2017-05-03 ENCOUNTER — Encounter: Payer: Self-pay | Admitting: Physical Medicine & Rehabilitation

## 2017-05-03 ENCOUNTER — Other Ambulatory Visit: Payer: Self-pay | Admitting: Physical Medicine & Rehabilitation

## 2017-05-03 VITALS — BP 145/101 | HR 105

## 2017-05-03 DIAGNOSIS — G8929 Other chronic pain: Secondary | ICD-10-CM | POA: Insufficient documentation

## 2017-05-03 DIAGNOSIS — S14129S Central cord syndrome at unspecified level of cervical spinal cord, sequela: Secondary | ICD-10-CM

## 2017-05-03 DIAGNOSIS — F17210 Nicotine dependence, cigarettes, uncomplicated: Secondary | ICD-10-CM | POA: Insufficient documentation

## 2017-05-03 DIAGNOSIS — D649 Anemia, unspecified: Secondary | ICD-10-CM | POA: Insufficient documentation

## 2017-05-03 DIAGNOSIS — N281 Cyst of kidney, acquired: Secondary | ICD-10-CM | POA: Diagnosis not present

## 2017-05-03 DIAGNOSIS — S14109S Unspecified injury at unspecified level of cervical spinal cord, sequela: Secondary | ICD-10-CM | POA: Diagnosis not present

## 2017-05-03 DIAGNOSIS — S14129A Central cord syndrome at unspecified level of cervical spinal cord, initial encounter: Secondary | ICD-10-CM | POA: Diagnosis present

## 2017-05-03 DIAGNOSIS — M792 Neuralgia and neuritis, unspecified: Secondary | ICD-10-CM

## 2017-05-03 DIAGNOSIS — M25511 Pain in right shoulder: Secondary | ICD-10-CM | POA: Insufficient documentation

## 2017-05-03 DIAGNOSIS — M542 Cervicalgia: Secondary | ICD-10-CM | POA: Diagnosis not present

## 2017-05-03 DIAGNOSIS — M545 Low back pain: Secondary | ICD-10-CM | POA: Diagnosis not present

## 2017-05-03 MED ORDER — BACLOFEN 10 MG PO TABS: 10.0000 mg | ORAL_TABLET | Freq: Three times a day (TID) | ORAL | 4 refills | Status: DC

## 2017-05-03 MED ORDER — TRAMADOL HCL 50 MG PO TABS: 50.0000 mg | ORAL_TABLET | Freq: Two times a day (BID) | ORAL | 2 refills | Status: DC | PRN

## 2017-05-03 MED ORDER — AMITRIPTYLINE HCL 10 MG PO TABS: 10.0000 mg | ORAL_TABLET | Freq: Every day | ORAL | 2 refills | Status: DC

## 2017-05-03 NOTE — Progress Notes (Signed)
Subjective:    Patient ID: Patrick Romero, male    DOB: 08/07/1960, 56 y.o.   MRN: [id number]  HPI   Mr Kochan is here in follow up of his cervical spinal cord injury/central cord. He has constant pain which he describes as pins and needles. He is taking cyclobenzaprine and gabapentin but feels that they aren't helping. He is not using diclofenac but is occasionally taking ibuprofen. He is using a cane to walk and hasn't fallen. He does feel that his whole body is trembling and spasm-ing at times.   Pain Inventory Average Pain 8 Pain Right Now 8 My pain is constant, sharp, stabbing and aching  In the last 24 hours, has pain interfered with the following? General activity 8 Relation with others 8 Enjoyment of life 8 What TIME of day is your pain at its worst? all Sleep (in general) Fair  Pain is worse with: standing Pain improves with: heat/ice and medication Relief from Meds: 8  Mobility use a cane ability to climb steps?  yes do you drive?  yes  Function not employed: date last employed .  Neuro/Psych weakness numbness tremor tingling trouble walking spasms dizziness confusion depression  Prior Studies Any changes since last visit?  no  Physicians involved in your care Any changes since last visit?  no   Family History  Problem Relation Age of Onset  . Hypertension Mother   . Lung cancer Father   . COPD Sister    Social History   Social History  . Marital status: Single    Spouse name: N/A  . Number of children: N/A  . Years of education: N/A   Social History Main Topics  . Smoking status: Current Every Day Smoker    Packs/day: 0.25    Types: Cigarettes  . Smokeless tobacco: Never Used     Comment: pt is also using nicotine patch  . Alcohol use Yes     Comment: occ beer   . Drug use: No  . Sexual activity: Yes   Other Topics Concern  . Not on file   Social History Narrative  . No narrative on file   Past Surgical History:    Procedure Laterality Date  . ROBOTIC ASSITED PARTIAL NEPHRECTOMY Right 01/04/2017   Procedure: XI ROBOTIC ASSITED PARTIAL NEPHRECTOMY;  Surgeon: [name], MD;  Location: [facility];  Service: Urology;  Laterality: Right;   Past Medical History:  Diagnosis Date  . Arthritis   . Chronic back pain   . Chronic neck pain   . Depression   . Herniated disc, cervical   . Hypertension   . Sickle cell trait ([place])   . Stroke [facility])    weakness in right hand and leg , numbness on left    There were no vitals taken for this visit.  Opioid Risk Score:   Fall Risk Score:  `1  Depression screen PHQ 2/9  Depression screen [facility] 2/9 02/17/2017 12/16/2016 11/23/2016 10/26/2016  Decreased Interest 2 1 1 1   Down, Depressed, Hopeless 2 2 1 2   PHQ - 2 Score 4 3 2 3   Altered sleeping 2 1 3 1   Tired, decreased energy 2 2 1 1   Change in appetite 0 - 0 0  Feeling bad or failure about yourself  1 0 0 1  Trouble concentrating 2 0 0 1  Moving slowly or fidgety/restless 0 2 1 1   Suicidal thoughts 0 0 0 0  PHQ-9 Score 11 8 7  8  Difficult doing work/chores - - - Somewhat difficult     Review of Systems  Constitutional: Negative.   HENT: Negative.   Eyes: Negative.   Respiratory: Negative.   Cardiovascular: Negative.   Gastrointestinal: Negative.   Endocrine: Negative.   Genitourinary: Negative.   Musculoskeletal: Negative.   Skin: Negative.   Allergic/Immunologic: Negative.   Neurological: Negative.   Hematological: Negative.   Psychiatric/Behavioral: Negative.   All other systems reviewed and are negative.      Objective:   Physical Exam  HEENT: Poor dentition. Normocephalic. Atraumatic.  Cardio: rrr  Neck: normal appearance. Head forward slightly Resp: CTA Bilaterally without wheezes or rales. Normal effort t  Skin: CDI  Neuro: Alert and oriented x 3+ to 4/5 left deltoid, biceps,4triceps, wrist extension 4/5, hand grip 4/5 . 3-/5 right deltoid muscle, biceps,3 triceps, wrist  extension. Hand grip 3.  LLE: 4-/5 at the ankle dorsiflexor, plantar flexor, 4 KE/HF RLE: 3+ HF, 3/5 KE and 3/5 ADF/PF.  Decreased sensation in U>L, R>L.  GAIT NARROW BASED.   Musc: right shoulder with mildsublux. Passive abduction to 80 degrees, decreased ER/IR with passive movement---tightness limits ability to lift arm over head once again. Pt had pain with end range of motion as well. Pain along spinous process of C7. Limited cervical ROM in all fields.  Psych pleasant and appropriate.  Gen.: NAD      Assessment & Plan:  1. 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 --he resumes therapy this week             -discussed holding on wrist splint to work on more active use of hand/wrist/ROM 2.  Pain Management:  -elavil initiated for sleep, nerve pain -gabapentin for neuropathic pain--may continue at 900mg  tid (need to change in [facility])  -added tramadol 50mg  q12 prn for severe pain 3. Spasticity:   -resume baclofen -dc flexeril due to poor effects on tone -continue gait/strengthening/ROM 4. Anemia:follow up per primary  5. Right renal carcinoma s/p partial nephrectomy right (01/04/17) -scheduled to follow up with urology/oncology -prostate work up this Fall ongoing 6. . ETOH abuse/Tobacco: continue etoh abstinence 7. Right shoulder pain/capsulitis: continue stretching. outpt OT  -reviewed HEP today  59minutes of face to face patient care time were spent during this visit. All questions were encouraged and answered. Folllow up in weeks.

## 2017-05-03 NOTE — Patient Instructions (Signed)
KEEP STAYING ACTIVE WORK ON RANGE OF MOTION HEAT ,ICE, STRETCHING

## 2017-05-04 NOTE — Telephone Encounter (Signed)
Recieved electronic medication refill request for cyclobenzaprine, according to last note from yesterday:  He is taking cyclobenzaprine and gabapentin but feels that they aren't helping.   Not sure to refill this medication, please advise

## 2017-05-11 ENCOUNTER — Encounter: Payer: Self-pay | Admitting: Family Medicine

## 2017-05-11 ENCOUNTER — Ambulatory Visit: Payer: Medicaid Other | Attending: Family Medicine | Admitting: Family Medicine

## 2017-05-11 VITALS — BP 126/89 | HR 95 | Temp 98.6°F | Resp 18 | Ht 72.0 in | Wt 159.2 lb

## 2017-05-11 DIAGNOSIS — Z862 Personal history of diseases of the blood and blood-forming organs and certain disorders involving the immune mechanism: Secondary | ICD-10-CM

## 2017-05-11 DIAGNOSIS — Z7982 Long term (current) use of aspirin: Secondary | ICD-10-CM | POA: Diagnosis not present

## 2017-05-11 DIAGNOSIS — M542 Cervicalgia: Secondary | ICD-10-CM | POA: Diagnosis not present

## 2017-05-11 DIAGNOSIS — I10 Essential (primary) hypertension: Secondary | ICD-10-CM

## 2017-05-11 DIAGNOSIS — G8929 Other chronic pain: Secondary | ICD-10-CM | POA: Diagnosis not present

## 2017-05-11 DIAGNOSIS — D649 Anemia, unspecified: Secondary | ICD-10-CM | POA: Diagnosis not present

## 2017-05-11 DIAGNOSIS — C649 Malignant neoplasm of unspecified kidney, except renal pelvis: Secondary | ICD-10-CM | POA: Insufficient documentation

## 2017-05-11 DIAGNOSIS — Z79899 Other long term (current) drug therapy: Secondary | ICD-10-CM | POA: Diagnosis not present

## 2017-05-11 DIAGNOSIS — Z905 Acquired absence of kidney: Secondary | ICD-10-CM | POA: Insufficient documentation

## 2017-05-11 DIAGNOSIS — M549 Dorsalgia, unspecified: Secondary | ICD-10-CM | POA: Diagnosis not present

## 2017-05-11 DIAGNOSIS — F17200 Nicotine dependence, unspecified, uncomplicated: Secondary | ICD-10-CM | POA: Diagnosis not present

## 2017-05-11 MED ORDER — FERROUS SULFATE 325 (65 FE) MG PO TBEC: 325.0000 mg | DELAYED_RELEASE_TABLET | Freq: Three times a day (TID) | ORAL | 2 refills | Status: DC

## 2017-05-11 NOTE — Progress Notes (Signed)
Patient is here for f/up   Patient decline flu shot for today

## 2017-05-11 NOTE — Patient Instructions (Signed)
Iron-Rich Diet Iron is a mineral that helps your body to produce hemoglobin. Hemoglobin is a protein in your red blood cells that carries oxygen to your body's tissues. Eating too little iron may cause you to feel weak and tired, and it can increase your risk for infection. Eating enough iron is necessary for your body's metabolism, muscle function, and nervous system. Iron is naturally found in many foods. It can also be added to foods or fortified in foods. There are two types of dietary iron:  Heme iron. Heme iron is absorbed by the body more easily than nonheme iron. Heme iron is found in meat, poultry, and fish.  Nonheme iron. Nonheme iron is found in dietary supplements, iron-fortified grains, beans, and vegetables.  You may need to follow an iron-rich diet if:  You have been diagnosed with iron deficiency or iron-deficiency anemia.  You have a condition that prevents you from absorbing dietary iron, such as: ? Infection in your intestines. ? Celiac disease. This involves long-lasting (chronic) inflammation of your intestines.  You do not eat enough iron.  You eat a diet that is high in foods that impair iron absorption.  You have lost a lot of blood.  You have heavy bleeding during your menstrual cycle.  You are pregnant.  What is my plan? Your health care provider may help you to determine how much iron you need per day based on your condition. Generally, when a person consumes sufficient amounts of iron in the diet, the following iron needs are met:  Men. ? 14-18 years old: 11 mg per day. ? 19-50 years old: 8 mg per day.  Women. ? 14-18 years old: 15 mg per day. ? 19-50 years old: 18 mg per day. ? Over 50 years old: 8 mg per day. ? Pregnant women: 27 mg per day. ? Breastfeeding women: 9 mg per day.  What do I need to know about an iron-rich diet?  Eat fresh fruits and vegetables that are high in vitamin C along with foods that are high in iron. This will help  increase the amount of iron that your body absorbs from food, especially with foods containing nonheme iron. Foods that are high in vitamin C include oranges, peppers, tomatoes, and mango.  Take iron supplements only as directed by your health care provider. Overdose of iron can be life-threatening. If you were prescribed iron supplements, take them with orange juice or a vitamin C supplement.  Cook foods in pots and pans that are made from iron.  Eat nonheme iron-containing foods alongside foods that are high in heme iron. This helps to improve your iron absorption.  Certain foods and drinks contain compounds that impair iron absorption. Avoid eating these foods in the same meal as iron-rich foods or with iron supplements. These include: ? Coffee, black tea, and red wine. ? Milk, dairy products, and foods that are high in calcium. ? Beans, soybeans, and peas. ? Whole grains.  When eating foods that contain both nonheme iron and compounds that impair iron absorption, follow these tips to absorb iron better. ? Soak beans overnight before cooking. ? Soak whole grains overnight and drain them before using. ? Ferment flours before baking, such as using yeast in bread dough. What foods can I eat? Grains Iron-fortified breakfast cereal. Iron-fortified whole-wheat bread. Enriched rice. Sprouted grains. Vegetables Spinach. Potatoes with skin. Green peas. Broccoli. Red and green bell peppers. Fermented vegetables. Fruits Prunes. Raisins. Oranges. Strawberries. Mango. Grapefruit. Meats and Other Protein Sources   Beef liver. Oysters. Beef. Shrimp. [place]. Chicken. [place]. Sardines. Chickpeas. Nuts. Tofu. Beverages Tomato juice. Fresh orange juice. Prune juice. Hibiscus tea. Fortified instant breakfast shakes. Condiments Tahini. Fermented soy sauce. Sweets and Desserts Black-strap molasses. Other Wheat germ. The items listed above may not be a complete list of recommended foods or beverages.  Contact your dietitian for more options. What foods are not recommended? Grains Whole grains. Bran cereal. Bran flour. Oats. Vegetables Artichokes. Brussels sprouts. Kale. Fruits Blueberries. Raspberries. Strawberries. Figs. Meats and Other Protein Sources Soybeans. Products made from soy protein. Dairy Milk. Cream. Cheese. Yogurt. Cottage cheese. Beverages Coffee. Black tea. Red wine. Sweets and Desserts Cocoa. Chocolate. Ice cream. Other Basil. Oregano. Parsley. The items listed above may not be a complete list of foods and beverages to avoid. Contact your dietitian for more information. This information is not intended to replace advice given to you by your health care provider. Make sure you discuss any questions you have with your health care provider. Document Released: 02/08/2005 Document Revised: 01/15/2016 Document Reviewed: 01/22/2014 Elsevier Interactive Patient Education  [employer].

## 2017-05-11 NOTE — Progress Notes (Deleted)
Dr Tresa Moore Dec.  Biopsy  F/u 12/20   Recheck BP   Current smoker   Patches

## 2017-05-16 NOTE — Progress Notes (Signed)
Subjective:  Patient ID: Patrick Romero, male    DOB: 1961/04/19  Age: 56 y.o. MRN: [id number]  CC: Hypertension   HPI Patrick Romero presents  for HTN follow up. History of recently diagnosed renal carcinoma, HTN, and chronic neck and back pain. He recently underwent right robotic partial nephrectomy on 01/04/17. History of anemia post-intervention. He reports upcoming prostate biopsy in December. History of  cervical myelopathy and radiculopathy: He reports following up with neuro rehabilitation physician. Who recently made adjustments to medications to help improve symptoms. Patient reports muscle tightness and spasm. He has not picke up his medication yet, but reports plan to pick up prescribed medications from pharmacy after visit. History of hypertension. He reports adherence with medications. Denies any hear any CP, SOB, or heart palpitations. Cardiovascular risk factors: hypertension, male gender, sedentary lifestyle and smoking/ tobacco exposure. Use of agents associated with hypertension: NSAIDS. History of target organ damage: none. History of renal nephroectomy.    Outpatient Medications Prior to Visit  Medication Sig Dispense Refill  . amLODipine (NORVASC) 5 MG tablet Take one tablet by mouth once a day. 30 tablet 4  . acetaminophen (TYLENOL) 500 MG tablet Take 1,000 mg by mouth every 6 (six) hours as needed.    Marland Kitchen amitriptyline (ELAVIL) 10 MG tablet Take 1-2 tablets (10-20 mg total) by mouth at bedtime. 60 tablet 2  . aspirin EC 325 MG tablet Take 325 mg by mouth daily.    . baclofen (LIORESAL) 10 MG tablet Take 1 tablet (10 mg total) by mouth 3 (three) times daily. 90 each 4  . gabapentin (NEURONTIN) 300 MG capsule Take 2 capsules (600 mg total) by mouth 4 (four) times daily. 240 capsule 2  . ibuprofen (ADVIL,MOTRIN) 200 MG tablet Take 400 mg by mouth every 6 (six) hours as needed.    . nicotine (NICODERM CQ - DOSED IN MG/24 HOURS) 21 mg/24hr patch Place 1 patch (21 mg total) onto  the skin daily. 28 patch 1  . traMADol (ULTRAM) 50 MG tablet Take 1 tablet (50 mg total) by mouth every 12 (twelve) hours as needed. 60 tablet 2   No facility-administered medications prior to visit.     ROS Review of Systems  Constitutional: Negative.   Eyes: Negative.   Respiratory: Negative.   Cardiovascular: Negative.   Gastrointestinal: Negative.   Musculoskeletal: Positive for back pain (chronic) and myalgias (chronic).  Neurological:        Paresthesias  Psychiatric/Behavioral: Negative for suicidal ideas.     Objective:  BP 126/89   Pulse 95   Temp 98.6 F (37 C) (Oral)   Resp 18   Ht 6' (1.829 m)   Wt 159 lb 3.2 oz (72.2 kg)   SpO2 97%   BMI 21.59 kg/m   BP/Weight 05/11/2017 05/03/2017 2/87/6811  Systolic BP 572 620 355  Diastolic BP 89 974 85  Wt. (Lbs) 159.2 - -  BMI 21.59 - -   Physical Exam  Constitutional: He appears well-developed and well-nourished.  Eyes: Conjunctivae are normal. Pupils are equal, round, and reactive to light.  Neck: No JVD present.  Cardiovascular: Normal rate, regular rhythm, normal heart sounds and intact distal pulses.  Pulmonary/Chest: Effort normal and breath sounds normal.  Abdominal: Soft. Bowel sounds are normal.  Musculoskeletal:       Right shoulder: He exhibits decreased range of motion.       Lumbar back: He exhibits pain (chronic).       Right hand: He exhibits  decreased range of motion. Decreased strength (Decreased arm and hand strength to the RUE. Motor strength 4/5 at baseline) noted.  Skin: Skin is warm and dry.  Psychiatric: He expresses no homicidal and no suicidal ideation. He expresses no suicidal plans and no homicidal plans.  Nursing note and vitals reviewed.   .  Assessment & Plan:   1. Hypertension, unspecified type Cont. Current BP medications.  2. History of anemia Recent history of renal carcinoma. Follow up with prostate biopsy. Consider GI referral for colonoscopy later after prostate  eval. - ferrous sulfate 325 (65 FE) MG EC tablet; Take 1 tablet (325 mg total) by mouth 3 (three) times daily with meals.  Dispense: 90 tablet; Refill: 2  3. Current smoker Encouraged smoking cessation.  He reports he has patches available.       Follow-up: Return in about 3 months (around 08/11/2017) for HTN.   Alfonse Spruce FNP

## 2017-05-31 ENCOUNTER — Telehealth: Payer: Self-pay

## 2017-05-31 NOTE — Telephone Encounter (Signed)
Patient called wanting to reschedule upcoming appointment.

## 2017-06-05 ENCOUNTER — Encounter: Payer: Medicaid Other | Admitting: Physical Medicine & Rehabilitation

## 2017-06-10 ENCOUNTER — Other Ambulatory Visit: Payer: Self-pay | Admitting: Physical Medicine & Rehabilitation

## 2017-06-10 DIAGNOSIS — R202 Paresthesia of skin: Principal | ICD-10-CM

## 2017-06-10 DIAGNOSIS — M79609 Pain in unspecified limb: Secondary | ICD-10-CM

## 2017-06-19 ENCOUNTER — Encounter: Payer: Medicaid Other | Admitting: Physical Medicine & Rehabilitation

## 2017-06-27 ENCOUNTER — Encounter: Payer: Self-pay | Admitting: Physical Medicine & Rehabilitation

## 2017-06-27 ENCOUNTER — Encounter: Payer: Medicaid Other | Attending: Physical Medicine & Rehabilitation | Admitting: Physical Medicine & Rehabilitation

## 2017-06-27 ENCOUNTER — Encounter: Payer: Medicaid Other | Admitting: Physical Medicine & Rehabilitation

## 2017-06-27 ENCOUNTER — Other Ambulatory Visit: Payer: Self-pay

## 2017-06-27 VITALS — BP 120/78 | HR 100

## 2017-06-27 DIAGNOSIS — S14109S Unspecified injury at unspecified level of cervical spinal cord, sequela: Secondary | ICD-10-CM | POA: Diagnosis not present

## 2017-06-27 DIAGNOSIS — M792 Neuralgia and neuritis, unspecified: Secondary | ICD-10-CM | POA: Diagnosis not present

## 2017-06-27 DIAGNOSIS — G8929 Other chronic pain: Secondary | ICD-10-CM | POA: Insufficient documentation

## 2017-06-27 DIAGNOSIS — M25511 Pain in right shoulder: Secondary | ICD-10-CM | POA: Diagnosis not present

## 2017-06-27 DIAGNOSIS — N281 Cyst of kidney, acquired: Secondary | ICD-10-CM | POA: Diagnosis not present

## 2017-06-27 DIAGNOSIS — M79609 Pain in unspecified limb: Secondary | ICD-10-CM | POA: Diagnosis not present

## 2017-06-27 DIAGNOSIS — D649 Anemia, unspecified: Secondary | ICD-10-CM | POA: Insufficient documentation

## 2017-06-27 DIAGNOSIS — S14129S Central cord syndrome at unspecified level of cervical spinal cord, sequela: Secondary | ICD-10-CM | POA: Diagnosis not present

## 2017-06-27 DIAGNOSIS — M542 Cervicalgia: Secondary | ICD-10-CM | POA: Insufficient documentation

## 2017-06-27 DIAGNOSIS — S14129A Central cord syndrome at unspecified level of cervical spinal cord, initial encounter: Secondary | ICD-10-CM | POA: Diagnosis not present

## 2017-06-27 DIAGNOSIS — F17210 Nicotine dependence, cigarettes, uncomplicated: Secondary | ICD-10-CM | POA: Insufficient documentation

## 2017-06-27 DIAGNOSIS — R202 Paresthesia of skin: Secondary | ICD-10-CM | POA: Diagnosis not present

## 2017-06-27 DIAGNOSIS — M545 Low back pain: Secondary | ICD-10-CM | POA: Diagnosis not present

## 2017-06-27 MED ORDER — BACLOFEN 10 MG PO TABS: 10.0000 mg | ORAL_TABLET | Freq: Four times a day (QID) | ORAL | 3 refills | Status: DC

## 2017-06-27 MED ORDER — AMITRIPTYLINE HCL 25 MG PO TABS: 25.0000 mg | ORAL_TABLET | Freq: Every day | ORAL | 4 refills | Status: DC

## 2017-06-27 NOTE — Progress Notes (Signed)
Subjective:    Patient ID: Patrick Romero, male    DOB: 08-13-1960, 56 y.o.   MRN: [id number]  HPI   Patrick Romero is here in follow up of his cervical myelopathy and associated deficits/pain. He states his pain is an issues in his arms and legs (tingling and burning). He felt that the elavil has been helpful for sleep and his nerve pain. Baclofen helps too but doesn't last 8 hours. He uses tramadol for severe pain but it makes him sleepy.   From a mobility standpoint he's using his straight cane. He hasn't had any falls. He's independent around the house and community.   He continues to undergo treatment for his prostate cancer. He has a biopsy scheduled for 07/06/17. This process has him a bit anxious in general but he's taking things "day by day".    Pain Inventory Average Pain 6 Pain Right Now 6 My pain is dull and tingling  In the last 24 hours, has pain interfered with the following? General activity 6 Relation with others 6 Enjoyment of life 6 What TIME of day is your pain at its worst? morning Sleep (in general) Fair  Pain is worse with: walking, bending and standing Pain improves with: therapy/exercise, pacing activities and medication Relief from Meds: 4  Mobility walk with assistance use a cane ability to climb steps?  yes do you drive?  no transfers alone  Function I need assistance with the following:  meal prep, household duties and shopping  Neuro/Psych weakness numbness tremor spasms depression  Prior Studies Any changes since last visit?  no  Physicians involved in your care Any changes since last visit?  no   Family History  Problem Relation Age of Onset  . Hypertension Mother   . Lung cancer Father   . COPD Sister    Social History   Socioeconomic History  . Marital status: Single    Spouse name: Not on file  . Number of children: Not on file  . Years of education: Not on file  . Highest education level: Not on file  Social Needs  .  Financial resource strain: Not on file  . Food insecurity - worry: Not on file  . Food insecurity - inability: Not on file  . Transportation needs - medical: Not on file  . Transportation needs - non-medical: Not on file  Occupational History  . Not on file  Tobacco Use  . Smoking status: Current Every Day Smoker    Packs/day: 0.25    Types: Cigarettes  . Smokeless tobacco: Never Used  . Tobacco comment: pt is also using nicotine patch  Substance and Sexual Activity  . Alcohol use: Yes    Comment: occ beer   . Drug use: No  . Sexual activity: Yes  Other Topics Concern  . Not on file  Social History Narrative  . Not on file   Past Surgical History:  Procedure Laterality Date  . ROBOTIC ASSITED PARTIAL NEPHRECTOMY Right 01/04/2017   Procedure: XI ROBOTIC ASSITED PARTIAL NEPHRECTOMY;  Surgeon: [name], MD;  Location: [facility];  Service: Urology;  Laterality: Right;   Past Medical History:  Diagnosis Date  . Arthritis   . Chronic back pain   . Chronic neck pain   . Depression   . Herniated disc, cervical   . Hypertension   . Sickle cell trait ([place])   . Stroke [facility])    weakness in right hand and leg , numbness on left  There were no vitals taken for this visit.  Opioid Risk Score:   Fall Risk Score:  `1  Depression screen PHQ 2/9  Depression screen [facility] 2/9 06/27/2017 05/11/2017 02/17/2017 12/16/2016 11/23/2016 10/26/2016  Decreased Interest 1 1 2 1 1 1   Down, Depressed, Hopeless 1 1 2 2 1 2   PHQ - 2 Score 2 2 4 3 2 3   Altered sleeping - 2 2 1 3 1   Tired, decreased energy - 2 2 2 1 1   Change in appetite - 1 0 - 0 0  Feeling bad or failure about yourself  - 1 1 0 0 1  Trouble concentrating - 1 2 0 0 1  Moving slowly or fidgety/restless - 1 0 2 1 1   Suicidal thoughts - 0 0 0 0 0  PHQ-9 Score - 10 11 8 7 8   Difficult doing work/chores - - - - - Somewhat difficult     Review of Systems  Constitutional: Negative.   HENT: Negative.   Eyes: Negative.     Respiratory: Positive for shortness of breath.   Cardiovascular: Positive for leg swelling.  Gastrointestinal: Negative.   Endocrine: Negative.   Genitourinary: Negative.   Musculoskeletal: Negative.   Skin: Negative.   Allergic/Immunologic: Negative.   Neurological: Negative.   Hematological: Negative.   Psychiatric/Behavioral: Negative.        Objective:   Physical Exam  HEENT: Poor dentition. Normocephalic. Atraumatic.  Cardio:  RRR Neck: normal appearance. Head forward slightly Resp: normal effort  Skin: CDI  Neuro: Alert and oriented x 3+ to 4/5 left deltoid (r>L), 4- to 4 biceps,4triceps, wrist extension 4/5, hand grip 4/5 . 3-/5 right deltoid muscle, biceps,3 triceps, wrist extension. Hand grip 3.  LLE: 4-/5 at the ankle dorsiflexor, plantar flexor, 4 KE/HF RLE: 3+ HF, 3/5 KE and 3/5 ADF/PF.  Decreased sensation in U>L, R>L.   gait stable.   Musc: limited neck and right shoulder rom.  Psych pleasant  Gen.: NAD      Assessment & Plan:  1. 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 --he resumes therapy this week -discussed holding on wrist splint to work on more active use of hand/wrist/ROM 2.  Pain Management:  -increase elavil to 25mg  qhs -gabapentin for neuropathic pain--may continue at 900mg  tid              -continue tramadol 50mg  q12 prn for severe pain 3. Spasticity:   -incresae baclofen to 10mg  QID -continue gait/strengthening/ROM 4. Anemia:follow up per primary  5. Right renal carcinoma s/p partial nephrectomy right (01/04/17) -scheduled to follow up with urology/oncology -prostate work up this Fall ongoing 6. . ETOH abuse/Tobacco: continue etoh abstinence 7. Right shoulder pain/capsulitis: continue stretching. outpt OT            -reviewed HEP today  64minutes of face to face patient care time were spent during this  visit. All questions were encouraged and answered. Folllow up in 2 months.

## 2017-06-27 NOTE — Patient Instructions (Signed)
PLEASE FEEL FREE TO CALL OUR OFFICE WITH ANY PROBLEMS OR QUESTIONS ([phone])  HAVE A HAPPY HOLIDAYS!                     ^                  ^^                ^ ^ ^             ^ ^ ^ ^ ^           ^ ^ ^ ^ ^ ^ ^        ^ ^ ^ ^ ^ ^ ^ ^ ^      ^ ^ ^ ^ ^ ^ ^ ^ ^ ^ ^                ^^^^                ^^^^                ^^^^     

## 2017-08-03 ENCOUNTER — Other Ambulatory Visit: Payer: Self-pay | Admitting: Urology

## 2017-08-11 ENCOUNTER — Ambulatory Visit: Payer: Medicaid Other | Admitting: Family Medicine

## 2017-08-17 ENCOUNTER — Ambulatory Visit: Payer: Medicaid Other | Admitting: Family Medicine

## 2017-08-22 ENCOUNTER — Encounter: Payer: Self-pay | Admitting: Family Medicine

## 2017-08-22 ENCOUNTER — Other Ambulatory Visit: Payer: Self-pay

## 2017-08-22 ENCOUNTER — Ambulatory Visit: Payer: Medicaid Other | Attending: Family Medicine | Admitting: Family Medicine

## 2017-08-22 VITALS — BP 143/96 | HR 105 | Temp 98.2°F | Resp 16 | Ht 72.0 in | Wt 159.4 lb

## 2017-08-22 DIAGNOSIS — Z79899 Other long term (current) drug therapy: Secondary | ICD-10-CM | POA: Diagnosis not present

## 2017-08-22 DIAGNOSIS — Z13220 Encounter for screening for lipoid disorders: Secondary | ICD-10-CM

## 2017-08-22 DIAGNOSIS — Z7982 Long term (current) use of aspirin: Secondary | ICD-10-CM | POA: Insufficient documentation

## 2017-08-22 DIAGNOSIS — Z905 Acquired absence of kidney: Secondary | ICD-10-CM | POA: Diagnosis not present

## 2017-08-22 DIAGNOSIS — G8929 Other chronic pain: Secondary | ICD-10-CM | POA: Diagnosis not present

## 2017-08-22 DIAGNOSIS — I10 Essential (primary) hypertension: Secondary | ICD-10-CM | POA: Diagnosis not present

## 2017-08-22 DIAGNOSIS — D649 Anemia, unspecified: Secondary | ICD-10-CM | POA: Diagnosis not present

## 2017-08-22 DIAGNOSIS — Z85528 Personal history of other malignant neoplasm of kidney: Secondary | ICD-10-CM | POA: Insufficient documentation

## 2017-08-22 DIAGNOSIS — M542 Cervicalgia: Secondary | ICD-10-CM | POA: Diagnosis not present

## 2017-08-22 DIAGNOSIS — Z8546 Personal history of malignant neoplasm of prostate: Secondary | ICD-10-CM | POA: Diagnosis not present

## 2017-08-22 MED ORDER — AMLODIPINE BESYLATE 5 MG PO TABS: ORAL_TABLET | ORAL | 4 refills | Status: DC

## 2017-08-22 NOTE — Patient Instructions (Signed)
Managing Your Hypertension Hypertension is commonly called high blood pressure. This is when the force of your blood pressing against the walls of your arteries is too strong. Arteries are blood vessels that carry blood from your heart throughout your body. Hypertension forces the heart to work harder to pump blood, and may cause the arteries to become narrow or stiff. Having untreated or uncontrolled hypertension can cause heart attack, stroke, kidney disease, and other problems. What are blood pressure readings? A blood pressure reading consists of a higher number over a lower number. Ideally, your blood pressure should be below 120/80. The first ("top") number is called the systolic pressure. It is a measure of the pressure in your arteries as your heart beats. The second ("bottom") number is called the diastolic pressure. It is a measure of the pressure in your arteries as the heart relaxes. What does my blood pressure reading mean? Blood pressure is classified into four stages. Based on your blood pressure reading, your health care provider may use the following stages to determine what type of treatment you need, if any. Systolic pressure and diastolic pressure are measured in a unit called mm Hg. Normal  Systolic pressure: below 120.  Diastolic pressure: below 80. Elevated  Systolic pressure: 120-129.  Diastolic pressure: below 80. Hypertension stage 1  Systolic pressure: 130-139.  Diastolic pressure: 80-89. Hypertension stage 2  Systolic pressure: 140 or above.  Diastolic pressure: 90 or above. What health risks are associated with hypertension? Managing your hypertension is an important responsibility. Uncontrolled hypertension can lead to:  A heart attack.  A stroke.  A weakened blood vessel (aneurysm).  Heart failure.  Kidney damage.  Eye damage.  Metabolic syndrome.  Memory and concentration problems.  What changes can I make to manage my  hypertension? Hypertension can be managed by making lifestyle changes and possibly by taking medicines. Your health care provider will help you make a plan to bring your blood pressure within a normal range. Eating and drinking  Eat a diet that is high in fiber and potassium, and low in salt (sodium), added sugar, and fat. An example eating plan is called the DASH (Dietary Approaches to Stop Hypertension) diet. To eat this way: ? Eat plenty of fresh fruits and vegetables. Try to fill half of your plate at each meal with fruits and vegetables. ? Eat whole grains, such as whole wheat pasta, brown rice, or whole grain bread. Fill about one quarter of your plate with whole grains. ? Eat low-fat diary products. ? Avoid fatty cuts of meat, processed or cured meats, and poultry with skin. Fill about one quarter of your plate with lean proteins such as fish, chicken without skin, beans, eggs, and tofu. ? Avoid premade and processed foods. These tend to be higher in sodium, added sugar, and fat.  Reduce your daily sodium intake. Most people with hypertension should eat less than 1,500 mg of sodium a day.  Limit alcohol intake to no more than 1 drink a day for nonpregnant women and 2 drinks a day for men. One drink equals 12 oz of beer, 5 oz of wine, or 1 oz of hard liquor. Lifestyle  Work with your health care provider to maintain a healthy body weight, or to lose weight. Ask what an ideal weight is for you.  Get at least 30 minutes of exercise that causes your heart to beat faster (aerobic exercise) most days of the week. Activities may include walking, swimming, or biking.  Include exercise   to strengthen your muscles (resistance exercise), such as weight lifting, as part of your weekly exercise routine. Try to do these types of exercises for 30 minutes at least 3 days a week.  Do not use any products that contain nicotine or tobacco, such as cigarettes and e-cigarettes. If you need help quitting, ask  your health care provider.  Control any long-term (chronic) conditions you have, such as high cholesterol or diabetes. Monitoring  Monitor your blood pressure at home as told by your health care provider. Your personal target blood pressure may vary depending on your medical conditions, your age, and other factors.  Have your blood pressure checked regularly, as often as told by your health care provider. Working with your health care provider  Review all the medicines you take with your health care provider because there may be side effects or interactions.  Talk with your health care provider about your diet, exercise habits, and other lifestyle factors that may be contributing to hypertension.  Visit your health care provider regularly. Your health care provider can help you create and adjust your plan for managing hypertension. Will I need medicine to control my blood pressure? Your health care provider may prescribe medicine if lifestyle changes are not enough to get your blood pressure under control, and if:  Your systolic blood pressure is 130 or higher.  Your diastolic blood pressure is 80 or higher.  Take medicines only as told by your health care provider. Follow the directions carefully. Blood pressure medicines must be taken as prescribed. The medicine does not work as well when you skip doses. Skipping doses also puts you at risk for problems. Contact a health care provider if:  You think you are having a reaction to medicines you have taken.  You have repeated (recurrent) headaches.  You feel dizzy.  You have swelling in your ankles.  You have trouble with your vision. Get help right away if:  You develop a severe headache or confusion.  You have unusual weakness or numbness, or you feel faint.  You have severe pain in your chest or abdomen.  You vomit repeatedly.  You have trouble breathing. Summary  Hypertension is when the force of blood pumping through  your arteries is too strong. If this condition is not controlled, it may put you at risk for serious complications.  Your personal target blood pressure may vary depending on your medical conditions, your age, and other factors. For most people, a normal blood pressure is less than 120/80.  Hypertension is managed by lifestyle changes, medicines, or both. Lifestyle changes include weight loss, eating a healthy, low-sodium diet, exercising more, and limiting alcohol. This information is not intended to replace advice given to you by your health care provider. Make sure you discuss any questions you have with your health care provider. Document Released: 03/21/2012 Document Revised: 05/25/2016 Document Reviewed: 05/25/2016 Elsevier Interactive Patient Education  2018 [employer]  

## 2017-08-22 NOTE — Progress Notes (Signed)
Routine f/u

## 2017-08-22 NOTE — Progress Notes (Signed)
Subjective:  Patient ID: Patrick Romero, male    DOB: March 30, 1961  Age: 57 y.o. MRN: [id number]  CC: Follow-up   HPI Patrick Romero presents  for HTN follow up. History of renal carcinoma, HTN, and chronic neck and back pain. Heunderwent right robotic partial nephrectomy on 01/04/17. History of anemia post-intervention. Prostate biopsy in December reveled prostate carcinoma. He reports upcoming surgery with his urologist Dr.Manny in March. History of cervical myelopathy and radiculopathy: He  follows up with regularly with his neuro rehabilitation physician Dr.Swartz. History of hypertension. He reports adherence with medications. He reports checking his BP at home. SBP 110's to 120's. BP elevated in office today he reports eating sausage this am and salt w/ breakfast prior to office visit. Denies any hear any CP, SOB, or heart palpitations. Cardiovascular risk factors: hypertension, male gender, sedentary lifestyle and smoking/ tobacco exposure. Use of agents associated with hypertension: NSAIDS. History of target organ damage: none. History of renal nephrectomy.  Outpatient Medications Prior to Visit  Medication Sig Dispense Refill  . acetaminophen (TYLENOL) 500 MG tablet Take 1,000 mg by mouth every 6 (six) hours as needed.    Marland Kitchen amitriptyline (ELAVIL) 25 MG tablet Take 1 tablet (25 mg total) by mouth at bedtime. 30 tablet 4  . baclofen (LIORESAL) 10 MG tablet Take 1 tablet (10 mg total) by mouth 4 (four) times daily. 120 tablet 3  . ferrous sulfate 325 (65 FE) MG EC tablet Take 1 tablet (325 mg total) by mouth 3 (three) times daily with meals. 90 tablet 2  . gabapentin (NEURONTIN) 300 MG capsule TAKE 2 CAPSULES 4 TIMES A DAY 240 capsule 2  . ibuprofen (ADVIL,MOTRIN) 200 MG tablet Take 400 mg by mouth every 6 (six) hours as needed.    . nicotine (NICODERM CQ - DOSED IN MG/24 HOURS) 21 mg/24hr patch Place 1 patch (21 mg total) onto the skin daily. 28 patch 1  . traMADol (ULTRAM) 50 MG tablet Take  1 tablet (50 mg total) by mouth every 12 (twelve) hours as needed. 60 tablet 2  . amLODipine (NORVASC) 5 MG tablet Take one tablet by mouth once a day. 30 tablet 4  . aspirin EC 325 MG tablet Take 325 mg by mouth daily.     No facility-administered medications prior to visit.    Review of Systems  Constitutional: Negative.   Respiratory: Negative.   Cardiovascular: Negative.   Musculoskeletal: Back pain: chronic. Myalgias: chronic.  Neurological: Negative for dizziness.  Psychiatric/Behavioral: Negative for suicidal ideas.     Objective:  BP (!) 143/96 (BP Location: Left Arm, Cuff Size: Normal)   Pulse (!) 105   Temp 98.2 F (36.8 C) (Oral)   Resp 16   Ht 6' (1.829 m)   Wt 159 lb 6.4 oz (72.3 kg)   SpO2 98%   BMI 21.62 kg/m   BP/Weight 08/22/2017 06/27/2017 54/12/5033  Systolic BP 465 681 275  Diastolic BP 96 78 89  Wt. (Lbs) 159.4 - 159.2  BMI 21.62 - 21.59   Physical Exam  Nursing note and vitals reviewed. Constitutional: He appears well-developed and well-nourished.  Cardiovascular: Normal rate, regular rhythm, normal heart sounds and intact distal pulses.  Respiratory: Effort normal and breath sounds normal.  GI: Soft. Bowel sounds are normal.  Psychiatric: He has a normal mood and affect.    Assessment & Plan:   1. Hypertension, unspecified type  - amLODipine (NORVASC) 5 MG tablet; Take one tablet by mouth once a day.  Dispense: 30 tablet; Refill: 4  2. Screening cholesterol level  - Lipid Panel; Future      Follow-up: Return in about 3 months (around 11/19/2017) for HTN .   Alfonse Spruce FNP

## 2017-08-28 ENCOUNTER — Encounter: Payer: Medicaid Other | Attending: Physical Medicine & Rehabilitation | Admitting: Physical Medicine & Rehabilitation

## 2017-08-28 ENCOUNTER — Encounter: Payer: Self-pay | Admitting: Physical Medicine & Rehabilitation

## 2017-08-28 VITALS — BP 123/55 | HR 110

## 2017-08-28 DIAGNOSIS — N281 Cyst of kidney, acquired: Secondary | ICD-10-CM | POA: Diagnosis not present

## 2017-08-28 DIAGNOSIS — M545 Low back pain: Secondary | ICD-10-CM | POA: Insufficient documentation

## 2017-08-28 DIAGNOSIS — F17210 Nicotine dependence, cigarettes, uncomplicated: Secondary | ICD-10-CM | POA: Insufficient documentation

## 2017-08-28 DIAGNOSIS — S14129S Central cord syndrome at unspecified level of cervical spinal cord, sequela: Secondary | ICD-10-CM | POA: Diagnosis not present

## 2017-08-28 DIAGNOSIS — G8929 Other chronic pain: Secondary | ICD-10-CM | POA: Insufficient documentation

## 2017-08-28 DIAGNOSIS — Z79899 Other long term (current) drug therapy: Secondary | ICD-10-CM | POA: Diagnosis not present

## 2017-08-28 DIAGNOSIS — M792 Neuralgia and neuritis, unspecified: Secondary | ICD-10-CM

## 2017-08-28 DIAGNOSIS — Z5181 Encounter for therapeutic drug level monitoring: Secondary | ICD-10-CM

## 2017-08-28 DIAGNOSIS — D649 Anemia, unspecified: Secondary | ICD-10-CM | POA: Insufficient documentation

## 2017-08-28 DIAGNOSIS — M25511 Pain in right shoulder: Secondary | ICD-10-CM | POA: Diagnosis not present

## 2017-08-28 DIAGNOSIS — S14129D Central cord syndrome at unspecified level of cervical spinal cord, subsequent encounter: Secondary | ICD-10-CM | POA: Diagnosis not present

## 2017-08-28 DIAGNOSIS — M542 Cervicalgia: Secondary | ICD-10-CM | POA: Insufficient documentation

## 2017-08-28 DIAGNOSIS — S14129A Central cord syndrome at unspecified level of cervical spinal cord, initial encounter: Secondary | ICD-10-CM | POA: Insufficient documentation

## 2017-08-28 MED ORDER — TRAMADOL HCL 50 MG PO TABS: 50.0000 mg | ORAL_TABLET | Freq: Two times a day (BID) | ORAL | 2 refills | Status: DC | PRN

## 2017-08-28 MED ORDER — BACLOFEN 20 MG PO TABS: 20.0000 mg | ORAL_TABLET | Freq: Four times a day (QID) | ORAL | 5 refills | Status: DC

## 2017-08-28 NOTE — Patient Instructions (Signed)
Baclofen:  Days 1 through 5: Take 10 mg in the morning afternoon and evening and 20 mg at night  Days 6 through 10 take 20 mg in the morning 10 mg in the afternoon and evening and 20 mg at night  Days 11 through 15 take 20 mg in the morning 10 mg in the afternoon and 20 mg in the evening and at night  Days 16+: Take 20 mg 4 times daily.  If you ever become sleepy with 1 of these increases in back down to the level prior

## 2017-08-28 NOTE — Progress Notes (Signed)
Subjective:    Patient ID: Patrick Romero, male    DOB: Jun 10, 1961, 57 y.o.   MRN: [id number]   HPI   Patrick Romero is here in follow-up of his central cord syndrome and associated tetraplegia.  He has had ongoing cramps and spasms particularly in his right arm and leg since I last saw him.  The baclofen adjustment helped somewhat but he still having problems especially at night and first thing in the morning.  He does a bit better with heat and when he stays active.  Otherwise he has been doing fairly well.  He uses his cane for balance.  He tells me he has prostate surgery scheduled for March.  For nerve pain he has been using tramadol as well as amitriptyline and gabapentin.  He has been taking the tramadol as well for the spasms but has not found it particularly helpful for that.  Pain Inventory Average Pain 7 Pain Right Now 7 My pain is sharp, burning and tingling  In the last 24 hours, has pain interfered with the following? General activity 5 Relation with others 0 Enjoyment of life 7 What TIME of day is your pain at its worst? n/a Sleep (in general) NA  Pain is worse with: walking, bending and standing Pain improves with: heat/ice and therapy/exercise Relief from Meds: 4  Mobility walk without assistance use a cane ability to climb steps?  yes do you drive?  no Do you have any goals in this area?  yes  Function disabled: date disabled 08/31/2016 I need assistance with the following:  meal prep, household duties and shopping Do you have any goals in this area?  yes  Neuro/Psych weakness numbness tremor tingling spasms depression  Prior Studies Any changes since last visit?  no  Physicians involved in your care Any changes since last visit?  yes Nephrologist- Dr. Sander Nephew    Family History  Problem Relation Age of Onset  . Hypertension Mother   . Lung cancer Father   . COPD Sister    Social History   Socioeconomic History  . Marital status: Single      Spouse name: Not on file  . Number of children: Not on file  . Years of education: Not on file  . Highest education level: Not on file  Social Needs  . Financial resource strain: Not on file  . Food insecurity - worry: Not on file  . Food insecurity - inability: Not on file  . Transportation needs - medical: Not on file  . Transportation needs - non-medical: Not on file  Occupational History  . Not on file  Tobacco Use  . Smoking status: Current Every Day Smoker    Packs/day: 0.25    Types: Cigarettes  . Smokeless tobacco: Never Used  . Tobacco comment: pt is also using nicotine patch  Substance and Sexual Activity  . Alcohol use: Yes    Comment: occ beer   . Drug use: No  . Sexual activity: Yes  Other Topics Concern  . Not on file  Social History Narrative  . Not on file   Past Surgical History:  Procedure Laterality Date  . ROBOTIC ASSITED PARTIAL NEPHRECTOMY Right 01/04/2017   Procedure: XI ROBOTIC ASSITED PARTIAL NEPHRECTOMY;  Surgeon: [name], MD;  Location: [facility];  Service: Urology;  Laterality: Right;   Past Medical History:  Diagnosis Date  . Arthritis   . Chronic back pain   . Chronic neck pain   .  Depression   . Herniated disc, cervical   . Hypertension   . Sickle cell trait ([place])   . Stroke [facility])    weakness in right hand and leg , numbness on left    There were no vitals taken for this visit.  Opioid Risk Score:   Fall Risk Score:  `1  Depression screen PHQ 2/9  Depression screen [facility] 2/9 08/22/2017 06/27/2017 05/11/2017 02/17/2017 12/16/2016 11/23/2016 10/26/2016  Decreased Interest 0 1 1 2 1 1 1   Down, Depressed, Hopeless 0 1 1 2 2 1 2   PHQ - 2 Score 0 2 2 4 3 2 3   Altered sleeping 1 - 2 2 1 3 1   Tired, decreased energy 1 - 2 2 2 1 1   Change in appetite 0 - 1 0 - 0 0  Feeling bad or failure about yourself  0 - 1 1 0 0 1  Trouble concentrating 0 - 1 2 0 0 1  Moving slowly or fidgety/restless 0 - 1 0 2 1 1   Suicidal thoughts 0 - 0 0 0 0 0   PHQ-9 Score 2 - 10 11 8 7 8   Difficult doing work/chores - - - - - - Somewhat difficult       Review of Systems  Neurological: Positive for tremors, weakness and numbness.       Tingling Spasms   Psychiatric/Behavioral: Positive for dysphoric mood.  All other systems reviewed and are negative.      Objective:   Physical Exam  HEENT: Poor dentition. Normocephalic. Atraumatic.  Cardio: RRR Neck: normal appearance. Head forward slightly Resp:normal effort  Skin: CDI  Neuro: Alert and oriented x 3+ to 4/5 left deltoid (r>L), 4- to 4 biceps,4triceps, wrist extension 4/5, hand grip 4/5 . 3-/5 right deltoid muscle, biceps,3 triceps, wrist extension. Hand grip 3.  LLE: 4-/5 at the ankle dorsiflexor, plantar flexor, 4 KE/HF RLE: 3+ HF, 3/5 KE and 3/5 ADF/PF.  Decreased sensation in U>L, R>L.   gait stable.  Musc: limited neck and right shoulder rom.  Psych pleasant  Gen.: NAD      Assessment & Plan:  1. 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 --he resumes therapy this week -discussed holding on wrist splint to work on more active use of hand/wrist/ROM 2.  Pain Management:  -increase elavil to 25mg  qhs -gabapentin for neuropathic pain--may continue at 900mg  tid  -continue tramadol 50mg  q12 prn for severe pain   -We will continue the opioid monitoring program, this consists of regular clinic visits, examinations, routine drug screening, pill counts as well as use of [place] Controlled Substance Reporting System. [place].     -UDS 3. Spasticity:  -increase baclofen to 20mg  QID with titration written -continue gait/strengthening/ROM   -May benefit from Botox 4. Anemia:follow up per primary  5. Right renal carcinoma s/p partial nephrectomy right (01/04/17) -scheduled to follow up with  urology/oncology -Has scheduled prostatic surgery in March 6. . ETOH abuse/Tobacco: continue etoh abstinence 7. Right shoulder pain/capsulitis: continue stretching. outpt OT -reviewed HEP today  79minutes of face to face patient care time were spent during this visit. All questions were encouraged and answered. Folllow up in 2 months.

## 2017-09-03 ENCOUNTER — Other Ambulatory Visit: Payer: Self-pay | Admitting: Physical Medicine & Rehabilitation

## 2017-09-03 DIAGNOSIS — R202 Paresthesia of skin: Principal | ICD-10-CM

## 2017-09-03 DIAGNOSIS — M79609 Pain in unspecified limb: Secondary | ICD-10-CM

## 2017-09-04 NOTE — Patient Instructions (Signed)
Patrick Romero  09/04/2017   Your procedure is scheduled on: 09-13-17   Report to [facility] Main  Entrance Report to admitting at 6:30 AM   Call this number if you have problems the morning of surgery [phone]   Remember: Do not eat food or drink liquids :After Midnight.   Please consume a Clear Liquid Diet Prior to the Day of Surgery    CLEAR LIQUID DIET   Foods Allowed                                                                     Foods Excluded  Coffee and tea, regular and decaf                             liquids that you cannot  Plain Jell-O in any flavor                                             see through such as: Fruit ices (not with fruit pulp)                                     milk, soups, orange juice  Iced Popsicles                                    All solid food Carbonated beverages, regular and diet                                    Cranberry, grape and apple juices Sports drinks like Gatorade Lightly seasoned clear broth or consume(fat free) Sugar, honey syrup  Sample Menu Breakfast                                Lunch                                     Supper Cranberry juice                    Beef broth                            Chicken broth Jell-O                                     Grape juice                           Apple juice Coffee or tea  Jell-O                                      Popsicle                                                Coffee or tea                        Coffee or tea  _____________________________________________________________________   Take these medicines the morning of surgery with A SIP OF WATER: Amlodipine (Norvasc), and Gabapentin (Neurontin)                               You may not have any metal on your body including hair pins and              piercings  Do not wear jewelry, lotions, powders or deodorant             Men may shave face and neck.   Do not  bring valuables to the hospital. [place].  Contacts, dentures or bridgework may not be worn into surgery.  Leave suitcase in the car. After surgery it may be brought to your room.       Special Instructions: Follow your prep per you MD's instructions              Please read over the following fact sheets you were given: _____________________________________________________________________             [facility] - Preparing for Surgery Before surgery, you can play an important role.  Because skin is not sterile, your skin needs to be as free of germs as possible.  You can reduce the number of germs on your skin by washing with CHG (chlorahexidine gluconate) soap before surgery.  CHG is an antiseptic cleaner which kills germs and bonds with the skin to continue killing germs even after washing. Please DO NOT use if you have an allergy to CHG or antibacterial soaps.  If your skin becomes reddened/irritated stop using the CHG and inform your nurse when you arrive at Short Stay. Do not shave (including legs and underarms) for at least 48 hours prior to the first CHG shower.  You may shave your face/neck. Please follow these instructions carefully:  1.  Shower with CHG Soap the night before surgery and the  morning of Surgery.  2.  If you choose to wash your hair, wash your hair first as usual with your  normal  shampoo.  3.  After you shampoo, rinse your hair and body thoroughly to remove the  shampoo.                           4.  Use CHG as you would any other liquid soap.  You can apply chg directly  to the skin and wash                       Gently with a scrungie or clean washcloth.  5.  Apply the CHG Soap to your body ONLY FROM THE NECK DOWN.   Do not use on face/ open                           Wound or open sores. Avoid contact with eyes, ears mouth and genitals (private parts).                       Wash face,  Genitals (private parts)  with your normal soap.             6.  Wash thoroughly, paying special attention to the area where your surgery  will be performed.  7.  Thoroughly rinse your body with warm water from the neck down.  8.  DO NOT shower/wash with your normal soap after using and rinsing off  the CHG Soap.                9.  Pat yourself dry with a clean towel.            10.  Wear clean pajamas.            11.  Place clean sheets on your bed the night of your first shower and do not  sleep with pets. Day of Surgery : Do not apply any lotions/deodorants the morning of surgery.  Please wear clean clothes to the hospital/surgery center.  FAILURE TO FOLLOW THESE INSTRUCTIONS MAY RESULT IN THE CANCELLATION OF YOUR SURGERY PATIENT SIGNATURE_________________________________  NURSE SIGNATURE__________________________________  ________________________________________________________________________

## 2017-09-04 NOTE — Telephone Encounter (Signed)
Gabapentin reordered but sig changed as Dr Naaman Plummer notes reflect him to be taking 900 mg tid.

## 2017-09-05 ENCOUNTER — Other Ambulatory Visit: Payer: Self-pay | Admitting: Physical Medicine & Rehabilitation

## 2017-09-05 ENCOUNTER — Inpatient Hospital Stay (HOSPITAL_COMMUNITY)
Admission: RE | Admit: 2017-09-05 | Discharge: 2017-09-05 | Disposition: A | Discharge status: Home / Self Care | Payer: Medicaid Other | Source: Ambulatory Visit

## 2017-09-05 DIAGNOSIS — R202 Paresthesia of skin: Principal | ICD-10-CM

## 2017-09-05 DIAGNOSIS — M79609 Pain in unspecified limb: Secondary | ICD-10-CM

## 2017-09-05 NOTE — Patient Instructions (Addendum)
Patrick Romero  09/05/2017   Your procedure is scheduled on: Wednesday 09-13-17  Report to [facility] Main  Entrance  Report to admitting at  630 AM  Call this number if you have problems the morning of surgery [phone]   Remember: Do not eat food :After Midnight.MONDAY NIGHT CLEAR LIQUIDS ALL TUESDAY 09-12-17 PER DR MANNY INSTRUCTIONS, FOLLOW ALL [facility] BOWEL PREP INSTRUCTIONS, NO CLEAR LIQUIDS AFTER MIDNIGHT Tuesday NIGHT.      CLEAR LIQUID DIET   Foods Allowed                                                                     Foods Excluded  Coffee and tea, regular and decaf                             liquids that you cannot  Plain Jell-O in any flavor                                             see through such as: Fruit ices (not with fruit pulp)                                     milk, soups, orange juice  Iced Popsicles                                    All solid food Carbonated beverages, regular and diet                                    Cranberry, grape and apple juices Sports drinks like Gatorade Lightly seasoned clear broth or consume(fat free) Sugar, honey syrup  Sample Menu Breakfast                                Lunch                                     Supper Cranberry juice                    Beef broth                            Chicken broth Jell-O                                     Grape juice  Apple juice Coffee or tea                        Jell-O                                      Popsicle                                                Coffee or tea                        Coffee or tea  _____________________________________________________________________                   Take these medicines the morning of surgery with A SIP OF WATER: AMLODPINE  (NORVASC), GABAPENTIN(NEURONTIN), BACLOFEN, NICODERM PATCH              You may not have any metal on your body including hair pins and               piercings  Do not wear jewelry, make-up, lotions, powders or perfumes, deodorant             Do not wear nail polish.  Do not shave  48 hours prior to surgery.              Men may shave face and neck.   Do not bring valuables to the hospital. [place].  Contacts, dentures or bridgework may not be worn into surgery.  Leave suitcase in the car. After surgery it may be brought to your room.                  Please read over the following fact sheets you were given: _____________________________________________________________________  [facility] - Preparing for Surgery Before surgery, you can play an important role.  Because skin is not sterile, your skin needs to be as free of germs as possible.  You can reduce the number of germs on your skin by washing with CHG (chlorahexidine gluconate) soap before surgery.  CHG is an antiseptic cleaner which kills germs and bonds with the skin to continue killing germs even after washing. Please DO NOT use if you have an allergy to CHG or antibacterial soaps.  If your skin becomes reddened/irritated stop using the CHG and inform your nurse when you arrive at Short Stay. Do not shave (including legs and underarms) for at least 48 hours prior to the first CHG shower.  You may shave your face/neck. Please follow these instructions carefully:  1.  Shower with CHG Soap the night before surgery and the  morning of Surgery.  2.  If you choose to wash your hair, wash your hair first as usual with your  normal  shampoo.  3.  After you shampoo, rinse your hair and body thoroughly to remove the  shampoo.                           4.  Use CHG as you would any other liquid soap.  You can apply chg directly  to the skin and wash                       Gently with a scrungie or clean washcloth.  5.  Apply the CHG Soap to your body ONLY FROM THE NECK DOWN.   Do not use on face/ open                           Wound or open  sores. Avoid contact with eyes, ears mouth and genitals (private parts).                       Wash face,  Genitals (private parts) with your normal soap.             6.  Wash thoroughly, paying special attention to the area where your surgery  will be performed.  7.  Thoroughly rinse your body with warm water from the neck down.  8.  DO NOT shower/wash with your normal soap after using and rinsing off  the CHG Soap.                9.  Pat yourself dry with a clean towel.            10.  Wear clean pajamas.            11.  Place clean sheets on your bed the night of your first shower and do not  sleep with pets. Day of Surgery : Do not apply any lotions/deodorants the morning of surgery.  Please wear clean clothes to the hospital/surgery center.  FAILURE TO FOLLOW THESE INSTRUCTIONS MAY RESULT IN THE CANCELLATION OF YOUR SURGERY PATIENT SIGNATURE_________________________________  NURSE SIGNATURE__________________________________  ________________________________________________________________________             WHAT IS A BLOOD TRANSFUSION? Blood Transfusion Information  A transfusion is the replacement of blood or some of its parts. Blood is made up of multiple cells which provide different functions.  Red blood cells carry oxygen and are used for blood loss replacement.  White blood cells fight against infection.  Platelets control bleeding.  Plasma helps clot blood.  Other blood products are available for specialized needs, such as hemophilia or other clotting disorders. BEFORE THE TRANSFUSION  Who gives blood for transfusions?   Healthy volunteers who are fully evaluated to make sure their blood is safe. This is blood bank blood. Transfusion therapy is the safest it has ever been in the practice of medicine. Before blood is taken from a donor, a complete history is taken to make sure that person has no history of diseases nor engages in risky social behavior (examples are  intravenous drug use or sexual activity with multiple partners). The donor's travel history is screened to minimize risk of transmitting infections, such as malaria. The donated blood is tested for signs of infectious diseases, such as HIV and hepatitis. The blood is then tested to be sure it is compatible with you in order to minimize the chance of a transfusion reaction. If you or a relative donates blood, this is often done in anticipation of surgery and is not appropriate for emergency situations. It takes many days to process the donated blood. RISKS AND COMPLICATIONS Although transfusion therapy is very safe and saves many lives, the main dangers of transfusion include:   Getting an infectious disease.  Developing a transfusion reaction. This is an allergic reaction to something  in the blood you were given. Every precaution is taken to prevent this. The decision to have a blood transfusion has been considered carefully by your caregiver before blood is given. Blood is not given unless the benefits outweigh the risks. AFTER THE TRANSFUSION  Right after receiving a blood transfusion, you will usually feel much better and more energetic. This is especially true if your red blood cells have gotten low (anemic). The transfusion raises the level of the red blood cells which carry oxygen, and this usually causes an energy increase.  The nurse administering the transfusion will monitor you carefully for complications. HOME CARE INSTRUCTIONS  No special instructions are needed after a transfusion. You may find your energy is better. Speak with your caregiver about any limitations on activity for underlying diseases you may have. SEEK MEDICAL CARE IF:   Your condition is not improving after your transfusion.  You develop redness or irritation at the intravenous (IV) site. SEEK IMMEDIATE MEDICAL CARE IF:  Any of the following symptoms occur over the next 12 hours:  Shaking chills.  You have a  temperature by mouth above 102 F (38.9 C), not controlled by medicine.  Chest, back, or muscle pain.  People around you feel you are not acting correctly or are confused.  Shortness of breath or difficulty breathing.  Dizziness and fainting.  You get a rash or develop hives.  You have a decrease in urine output.  Your urine turns a dark color or changes to pink, red, or brown. Any of the following symptoms occur over the next 10 days:  You have a temperature by mouth above 102 F (38.9 C), not controlled by medicine.  Shortness of breath.  Weakness after normal activity.  The white part of the eye turns yellow (jaundice).  You have a decrease in the amount of urine or are urinating less often.  Your urine turns a dark color or changes to pink, red, or brown. Document Released: 06/24/2000 Document Revised: 09/19/2011 Document Reviewed: 02/11/2008 [facility] Patient Information 2014 [place], [place].  _______________________________________________________________________

## 2017-09-05 NOTE — Progress Notes (Signed)
EKG 02-22-17 EPIC

## 2017-09-08 ENCOUNTER — Other Ambulatory Visit: Payer: Self-pay

## 2017-09-08 ENCOUNTER — Encounter (HOSPITAL_COMMUNITY): Payer: Self-pay

## 2017-09-08 ENCOUNTER — Encounter (HOSPITAL_COMMUNITY)
Admission: RE | Admit: 2017-09-08 | Discharge: 2017-09-08 | Disposition: A | Discharge status: Home / Self Care | Payer: Medicaid Other | Source: Ambulatory Visit | Attending: Urology | Admitting: Urology

## 2017-09-08 DIAGNOSIS — C61 Malignant neoplasm of prostate: Secondary | ICD-10-CM | POA: Diagnosis not present

## 2017-09-08 DIAGNOSIS — Z01812 Encounter for preprocedural laboratory examination: Secondary | ICD-10-CM | POA: Insufficient documentation

## 2017-09-08 HISTORY — DX: Malignant (primary) neoplasm, unspecified: C80.1

## 2017-09-08 HISTORY — DX: Malignant neoplasm of unspecified kidney, except renal pelvis: C64.9

## 2017-09-08 LAB — BASIC METABOLIC PANEL
Anion gap: 11 (ref 5–15)
BUN: 9 mg/dL (ref 6–20)
CALCIUM: 9.3 mg/dL (ref 8.9–10.3)
CO2: 22 mmol/L (ref 22–32)
Chloride: 105 mmol/L (ref 101–111)
Creatinine, Ser: 1.01 mg/dL (ref 0.61–1.24)
GFR calc non Af Amer: >60 mL/min (ref >60)
Glucose, Bld: 92 mg/dL (ref 65–99)
Potassium: 4.1 mmol/L (ref 3.5–5.1)
SODIUM: 138 mmol/L (ref 135–145)

## 2017-09-08 LAB — CBC
HEMATOCRIT: 34.7 % — AB (ref 39.0–52.0)
Hemoglobin: 11 g/dL — ABNORMAL LOW (ref 13.0–17.0)
MCH: 25.7 pg — ABNORMAL LOW (ref 26.0–34.0)
MCHC: 31.7 g/dL (ref 30.0–36.0)
MCV: 81.1 fL (ref 78.0–100.0)
Platelets: 362 10*3/uL (ref 150–400)
RBC: 4.28 MIL/uL (ref 4.22–5.81)
RDW: 14 % (ref 11.5–15.5)
WBC: 6.3 10*3/uL (ref 4.0–10.5)

## 2017-09-12 NOTE — Anesthesia Preprocedure Evaluation (Addendum)
Anesthesia Evaluation  Patient identified by MRN, date of birth, ID band Patient awake    Reviewed: Allergy & Precautions, NPO status , Patient's Chart, lab work & pertinent test results  Airway Mallampati: III  TM Distance: >3 FB Neck ROM: Full    Dental  (+) Missing   Pulmonary Current Smoker,    Pulmonary exam normal breath sounds clear to auscultation       Cardiovascular hypertension, Pt. 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, Residual Symptoms    GI/Hepatic negative GI ROS, Neg liver ROS,   Endo/Other  negative endocrine ROS  Renal/GU Renal disease     Musculoskeletal negative musculoskeletal ROS (+)   Abdominal   Peds  Hematology  (+) Sickle cell trait and anemia ,   Anesthesia Other Findings PROSTATE CANCER  Reproductive/Obstetrics                            Anesthesia Physical Anesthesia Plan  ASA: III  Anesthesia Plan: General   Post-op Pain Management:    Induction: Intravenous  PONV Risk Score and Plan: 2 and Dexamethasone, Ondansetron and Treatment may vary due to age or medical condition  Airway Management Planned: Oral ETT  Additional Equipment:   Intra-op Plan:   Post-operative Plan: Extubation in OR  Informed Consent: I have reviewed the patients History and Physical, chart, labs and discussed the procedure including the risks, benefits and alternatives for the proposed anesthesia with the patient or authorized representative who has indicated his/her understanding and acceptance.   Dental advisory given  Plan Discussed with: CRNA  Anesthesia Plan Comments:         Anesthesia Quick Evaluation

## 2017-09-13 ENCOUNTER — Other Ambulatory Visit: Payer: Self-pay

## 2017-09-13 ENCOUNTER — Ambulatory Visit (HOSPITAL_COMMUNITY): Payer: Medicaid Other | Admitting: Anesthesiology

## 2017-09-13 ENCOUNTER — Encounter (HOSPITAL_COMMUNITY)
Admission: RE | Disposition: A | Discharge status: Home / Self Care | Payer: Self-pay | Source: Ambulatory Visit | Attending: Urology

## 2017-09-13 ENCOUNTER — Observation Stay (HOSPITAL_COMMUNITY)
Admission: RE | Admit: 2017-09-13 | Discharge: 2017-09-14 | Disposition: A | Discharge status: Home / Self Care | Payer: Medicaid Other | Source: Ambulatory Visit | Attending: Urology | Admitting: Urology

## 2017-09-13 ENCOUNTER — Encounter (HOSPITAL_COMMUNITY): Payer: Self-pay

## 2017-09-13 DIAGNOSIS — Z8249 Family history of ischemic heart disease and other diseases of the circulatory system: Secondary | ICD-10-CM | POA: Diagnosis not present

## 2017-09-13 DIAGNOSIS — Z905 Acquired absence of kidney: Secondary | ICD-10-CM | POA: Insufficient documentation

## 2017-09-13 DIAGNOSIS — Z7982 Long term (current) use of aspirin: Secondary | ICD-10-CM | POA: Insufficient documentation

## 2017-09-13 DIAGNOSIS — Z836 Family history of other diseases of the respiratory system: Secondary | ICD-10-CM | POA: Diagnosis not present

## 2017-09-13 DIAGNOSIS — D573 Sickle-cell trait: Secondary | ICD-10-CM | POA: Insufficient documentation

## 2017-09-13 DIAGNOSIS — I10 Essential (primary) hypertension: Secondary | ICD-10-CM | POA: Diagnosis not present

## 2017-09-13 DIAGNOSIS — Z79899 Other long term (current) drug therapy: Secondary | ICD-10-CM | POA: Diagnosis not present

## 2017-09-13 DIAGNOSIS — C61 Malignant neoplasm of prostate: Secondary | ICD-10-CM | POA: Diagnosis present

## 2017-09-13 DIAGNOSIS — Z801 Family history of malignant neoplasm of trachea, bronchus and lung: Secondary | ICD-10-CM | POA: Diagnosis not present

## 2017-09-13 DIAGNOSIS — Z8673 Personal history of transient ischemic attack (TIA), and cerebral infarction without residual deficits: Secondary | ICD-10-CM | POA: Insufficient documentation

## 2017-09-13 DIAGNOSIS — M069 Rheumatoid arthritis, unspecified: Secondary | ICD-10-CM | POA: Diagnosis not present

## 2017-09-13 DIAGNOSIS — Z85528 Personal history of other malignant neoplasm of kidney: Secondary | ICD-10-CM | POA: Diagnosis not present

## 2017-09-13 DIAGNOSIS — F17210 Nicotine dependence, cigarettes, uncomplicated: Secondary | ICD-10-CM | POA: Diagnosis not present

## 2017-09-13 DIAGNOSIS — G8929 Other chronic pain: Secondary | ICD-10-CM | POA: Diagnosis not present

## 2017-09-13 HISTORY — PX: ROBOT ASSISTED LAPAROSCOPIC RADICAL PROSTATECTOMY: SHX5141

## 2017-09-13 LAB — HEMOGLOBIN AND HEMATOCRIT, BLOOD
HEMATOCRIT: 32.5 % — AB (ref 39.0–52.0)
HEMOGLOBIN: 10.2 g/dL — AB (ref 13.0–17.0)

## 2017-09-13 LAB — TYPE AND SCREEN
ABO/RH(D): A POS
Antibody Screen: NEGATIVE

## 2017-09-13 SURGERY — PROSTATECTOMY, RADICAL, ROBOT-ASSISTED, LAPAROSCOPIC
Anesthesia: General

## 2017-09-13 MED ORDER — PROPOFOL 10 MG/ML IV BOLUS
INTRAVENOUS | Status: DC | PRN
  Administered 2017-09-13: 100 mg via INTRAVENOUS
  Administered 2017-09-13: 50 mg via INTRAVENOUS

## 2017-09-13 MED ORDER — SODIUM CHLORIDE 0.9 % IV BOLUS (SEPSIS)
1000.0000 mL | Freq: Once | INTRAVENOUS | Status: AC
  Administered 2017-09-13: 1000 mL via INTRAVENOUS

## 2017-09-13 MED ORDER — SULFAMETHOXAZOLE-TRIMETHOPRIM 800-160 MG PO TABS
1.0000 | ORAL_TABLET | Freq: Two times a day (BID) | ORAL | 0 refills | Status: DC
Start: 2017-09-13 — End: 2018-01-26

## 2017-09-13 MED ORDER — BUPIVACAINE LIPOSOME 1.3 % IJ SUSP
20.0000 mL | Freq: Once | INTRAMUSCULAR | Status: AC
  Administered 2017-09-13: 20 mL
  Filled 2017-09-13: qty 20

## 2017-09-13 MED ORDER — OXYCODONE HCL 5 MG PO TABS: 5.0000 mg | ORAL_TABLET | Freq: Once | ORAL | Status: DC | PRN

## 2017-09-13 MED ORDER — SODIUM CHLORIDE 0.9 % IJ SOLN
INTRAMUSCULAR | Status: AC
  Filled 2017-09-13: qty 20

## 2017-09-13 MED ORDER — SUGAMMADEX SODIUM 200 MG/2ML IV SOLN
INTRAVENOUS | Status: AC
  Filled 2017-09-13: qty 2

## 2017-09-13 MED ORDER — DEXAMETHASONE SODIUM PHOSPHATE 10 MG/ML IJ SOLN
INTRAMUSCULAR | Status: DC | PRN
  Administered 2017-09-13: 10 mg via INTRAVENOUS

## 2017-09-13 MED ORDER — SODIUM CHLORIDE 0.9 % IJ SOLN
INTRAMUSCULAR | Status: DC | PRN
  Administered 2017-09-13: 20 mL

## 2017-09-13 MED ORDER — MIDAZOLAM HCL 5 MG/5ML IJ SOLN
INTRAMUSCULAR | Status: DC | PRN
  Administered 2017-09-13: 2 mg via INTRAVENOUS

## 2017-09-13 MED ORDER — MIDAZOLAM HCL 2 MG/2ML IJ SOLN
INTRAMUSCULAR | Status: AC
  Filled 2017-09-13: qty 2

## 2017-09-13 MED ORDER — LIDOCAINE 2% (20 MG/ML) 5 ML SYRINGE
INTRAMUSCULAR | Status: AC
  Filled 2017-09-13: qty 5

## 2017-09-13 MED ORDER — OXYCODONE HCL 5 MG PO TABS: 5.0000 mg | ORAL_TABLET | ORAL | Status: DC | PRN

## 2017-09-13 MED ORDER — ONDANSETRON HCL 4 MG/2ML IJ SOLN
INTRAMUSCULAR | Status: AC
  Filled 2017-09-13: qty 2

## 2017-09-13 MED ORDER — FENTANYL CITRATE (PF) 100 MCG/2ML IJ SOLN
INTRAMUSCULAR | Status: DC | PRN
  Administered 2017-09-13 (×4): 50 ug via INTRAVENOUS
  Administered 2017-09-13: 100 ug via INTRAVENOUS
  Administered 2017-09-13: 50 ug via INTRAVENOUS

## 2017-09-13 MED ORDER — HYDROCODONE-ACETAMINOPHEN 5-325 MG PO TABS: 1.0000 | ORAL_TABLET | Freq: Four times a day (QID) | ORAL | 0 refills | Status: DC | PRN

## 2017-09-13 MED ORDER — CEFAZOLIN SODIUM-DEXTROSE 2-4 GM/100ML-% IV SOLN
2.0000 g | INTRAVENOUS | Status: AC
  Administered 2017-09-13: 2 g via INTRAVENOUS
  Filled 2017-09-13: qty 100

## 2017-09-13 MED ORDER — HYDROMORPHONE HCL 1 MG/ML IJ SOLN: 0.2500 mg | INTRAMUSCULAR | Status: DC | PRN

## 2017-09-13 MED ORDER — ONDANSETRON HCL 4 MG/2ML IJ SOLN: 4.0000 mg | INTRAMUSCULAR | Status: DC | PRN

## 2017-09-13 MED ORDER — ROCURONIUM BROMIDE 100 MG/10ML IV SOLN
INTRAVENOUS | Status: DC | PRN
  Administered 2017-09-13: 60 mg via INTRAVENOUS
  Administered 2017-09-13: 10 mg via INTRAVENOUS

## 2017-09-13 MED ORDER — AMITRIPTYLINE HCL 25 MG PO TABS
25.0000 mg | ORAL_TABLET | Freq: Every day | ORAL | Status: DC
  Administered 2017-09-13: 25 mg via ORAL
  Filled 2017-09-13: qty 1

## 2017-09-13 MED ORDER — GABAPENTIN 300 MG PO CAPS
300.0000 mg | ORAL_CAPSULE | Freq: Two times a day (BID) | ORAL | Status: DC
  Administered 2017-09-13 – 2017-09-14 (×2): 300 mg via ORAL
  Filled 2017-09-13 (×2): qty 1

## 2017-09-13 MED ORDER — DEXTROSE-NACL 5-0.45 % IV SOLN
INTRAVENOUS | Status: DC
  Administered 2017-09-13 – 2017-09-14 (×3): via INTRAVENOUS

## 2017-09-13 MED ORDER — ACETAMINOPHEN 500 MG PO TABS
1000.0000 mg | ORAL_TABLET | Freq: Four times a day (QID) | ORAL | Status: AC
  Administered 2017-09-13 – 2017-09-14 (×4): 1000 mg via ORAL
  Filled 2017-09-13 (×4): qty 2

## 2017-09-13 MED ORDER — ROCURONIUM BROMIDE 10 MG/ML (PF) SYRINGE
PREFILLED_SYRINGE | INTRAVENOUS | Status: AC
  Filled 2017-09-13: qty 5

## 2017-09-13 MED ORDER — MAGNESIUM CITRATE PO SOLN: 1.0000 | Freq: Once | ORAL | Status: DC

## 2017-09-13 MED ORDER — OXYCODONE HCL 5 MG/5ML PO SOLN
5.0000 mg | Freq: Once | ORAL | Status: DC | PRN
  Filled 2017-09-13: qty 5

## 2017-09-13 MED ORDER — PROPOFOL 10 MG/ML IV BOLUS
INTRAVENOUS | Status: AC
  Filled 2017-09-13: qty 20

## 2017-09-13 MED ORDER — PROMETHAZINE HCL 25 MG/ML IJ SOLN: 6.2500 mg | INTRAMUSCULAR | Status: DC | PRN

## 2017-09-13 MED ORDER — HYDROMORPHONE HCL 1 MG/ML IJ SOLN
0.5000 mg | INTRAMUSCULAR | Status: DC | PRN
  Administered 2017-09-13 (×2): 1 mg via INTRAVENOUS
  Administered 2017-09-13: 0.5 mg via INTRAVENOUS
  Filled 2017-09-13 (×3): qty 1

## 2017-09-13 MED ORDER — PHENYLEPHRINE 40 MCG/ML (10ML) SYRINGE FOR IV PUSH (FOR BLOOD PRESSURE SUPPORT)
PREFILLED_SYRINGE | INTRAVENOUS | Status: DC | PRN
  Administered 2017-09-13 (×4): 40 ug via INTRAVENOUS
  Administered 2017-09-13: 80 ug via INTRAVENOUS
  Administered 2017-09-13: 40 ug via INTRAVENOUS

## 2017-09-13 MED ORDER — PHENYLEPHRINE 40 MCG/ML (10ML) SYRINGE FOR IV PUSH (FOR BLOOD PRESSURE SUPPORT)
PREFILLED_SYRINGE | INTRAVENOUS | Status: AC
  Filled 2017-09-13: qty 10

## 2017-09-13 MED ORDER — AMLODIPINE BESYLATE 5 MG PO TABS
5.0000 mg | ORAL_TABLET | Freq: Every day | ORAL | Status: DC
  Administered 2017-09-14: 5 mg via ORAL
  Filled 2017-09-13: qty 1

## 2017-09-13 MED ORDER — FENTANYL CITRATE (PF) 250 MCG/5ML IJ SOLN
INTRAMUSCULAR | Status: AC
Start: 2017-09-13 — End: ?
  Filled 2017-09-13: qty 5

## 2017-09-13 MED ORDER — SUGAMMADEX SODIUM 200 MG/2ML IV SOLN
INTRAVENOUS | Status: DC | PRN
  Administered 2017-09-13: 150 mg via INTRAVENOUS

## 2017-09-13 MED ORDER — DEXAMETHASONE SODIUM PHOSPHATE 10 MG/ML IJ SOLN
INTRAMUSCULAR | Status: AC
  Filled 2017-09-13: qty 1

## 2017-09-13 MED ORDER — DIPHENHYDRAMINE HCL 50 MG/ML IJ SOLN: 12.5000 mg | Freq: Four times a day (QID) | INTRAMUSCULAR | Status: DC | PRN

## 2017-09-13 MED ORDER — LIDOCAINE 2% (20 MG/ML) 5 ML SYRINGE
INTRAMUSCULAR | Status: DC | PRN
  Administered 2017-09-13: 60 mg via INTRAVENOUS

## 2017-09-13 MED ORDER — DIPHENHYDRAMINE HCL 12.5 MG/5ML PO ELIX: 12.5000 mg | ORAL_SOLUTION | Freq: Four times a day (QID) | ORAL | Status: DC | PRN

## 2017-09-13 MED ORDER — FERROUS SULFATE 325 (65 FE) MG PO TABS
325.0000 mg | ORAL_TABLET | Freq: Every day | ORAL | Status: DC
  Administered 2017-09-14: 325 mg via ORAL
  Filled 2017-09-13: qty 1

## 2017-09-13 MED ORDER — FENTANYL CITRATE (PF) 100 MCG/2ML IJ SOLN
INTRAMUSCULAR | Status: AC
  Filled 2017-09-13: qty 2

## 2017-09-13 MED ORDER — LACTATED RINGERS IR SOLN
Status: DC | PRN
  Administered 2017-09-13: 1000 mL

## 2017-09-13 MED ORDER — LACTATED RINGERS IV SOLN
INTRAVENOUS | Status: DC
  Administered 2017-09-13 (×3): via INTRAVENOUS

## 2017-09-13 SURGICAL SUPPLY — 64 items
ADH SKN CLS APL DERMABOND .7 (GAUZE/BANDAGES/DRESSINGS) ×1
APPLICATOR COTTON TIP 6IN STRL (MISCELLANEOUS) ×3 IMPLANT
CATH FOLEY 2WAY SLVR 18FR 30CC (CATHETERS) ×3 IMPLANT
CATH TIEMANN FOLEY 18FR 5CC (CATHETERS) ×3 IMPLANT
CHLORAPREP W/TINT 26ML (MISCELLANEOUS) ×3 IMPLANT
CLIP VESOLOCK LG 6/CT PURPLE (CLIP) ×6 IMPLANT
CLOTH BEACON ORANGE TIMEOUT ST (SAFETY) ×3 IMPLANT
CONT SPEC 4OZ CLIKSEAL STRL BL (MISCELLANEOUS) ×3 IMPLANT
COVER TIP SHEARS 8 DVNC (MISCELLANEOUS) ×1 IMPLANT
COVER TIP SHEARS 8MM DA VINCI (MISCELLANEOUS) ×2
CUTTER ECHEON FLEX ENDO 45 340 (ENDOMECHANICALS) ×3 IMPLANT
DECANTER SPIKE VIAL GLASS SM (MISCELLANEOUS) ×3 IMPLANT
DERMABOND ADVANCED (GAUZE/BANDAGES/DRESSINGS) ×2
DERMABOND ADVANCED .7 DNX12 (GAUZE/BANDAGES/DRESSINGS) ×1 IMPLANT
DRAPE ARM DVNC X/XI (DISPOSABLE) ×4 IMPLANT
DRAPE COLUMN DVNC XI (DISPOSABLE) ×1 IMPLANT
DRAPE DA VINCI XI ARM (DISPOSABLE) ×8
DRAPE DA VINCI XI COLUMN (DISPOSABLE) ×2
DRAPE SURG IRRIG POUCH 19X23 (DRAPES) ×3 IMPLANT
DRSG TEGADERM 4X4.75 (GAUZE/BANDAGES/DRESSINGS) ×3 IMPLANT
ELECT PENCIL ROCKER SW 15FT (MISCELLANEOUS) ×3 IMPLANT
ELECT REM PT RETURN 15FT ADLT (MISCELLANEOUS) ×3 IMPLANT
GAUZE SPONGE 2X2 8PLY STRL LF (GAUZE/BANDAGES/DRESSINGS) ×1 IMPLANT
GLOVE BIO SURGEON STRL SZ 6.5 (GLOVE) ×2 IMPLANT
GLOVE BIO SURGEONS STRL SZ 6.5 (GLOVE) ×1
GLOVE BIOGEL M STRL SZ7.5 (GLOVE) ×6 IMPLANT
GLOVE BIOGEL PI IND STRL 7.5 (GLOVE) ×1 IMPLANT
GLOVE BIOGEL PI INDICATOR 7.5 (GLOVE) ×2
GOWN STRL REUS W/TWL LRG LVL3 (GOWN DISPOSABLE) ×9 IMPLANT
HOLDER FOLEY CATH W/STRAP (MISCELLANEOUS) ×3 IMPLANT
IRRIG SUCT STRYKERFLOW 2 WTIP (MISCELLANEOUS) ×3
IRRIGATION SUCT STRKRFLW 2 WTP (MISCELLANEOUS) ×1 IMPLANT
IV LACTATED RINGERS 1000ML (IV SOLUTION) ×3 IMPLANT
KIT PROCEDURE DA VINCI SI (MISCELLANEOUS) ×2
KIT PROCEDURE DVNC SI (MISCELLANEOUS) ×1 IMPLANT
NDL INSUFFLATION 14GA 120MM (NEEDLE) ×1 IMPLANT
NDL SPNL 22GX7 QUINCKE BK (NEEDLE) ×1 IMPLANT
NEEDLE INSUFFLATION 14GA 120MM (NEEDLE) ×3 IMPLANT
NEEDLE SPNL 22GX7 QUINCKE BK (NEEDLE) ×3 IMPLANT
PACK ROBOT UROLOGY CUSTOM (CUSTOM PROCEDURE TRAY) ×3 IMPLANT
PAD POSITIONING PINK XL (MISCELLANEOUS) ×2 IMPLANT
PORT ACCESS TROCAR AIRSEAL 12 (TROCAR) ×1 IMPLANT
PORT ACCESS TROCAR AIRSEAL 5M (TROCAR) ×2
RELOAD STAPLE 45 4.1 GRN THCK (STAPLE) ×1 IMPLANT
SEAL CANN UNIV 5-8 DVNC XI (MISCELLANEOUS) ×4 IMPLANT
SEAL XI 5MM-8MM UNIVERSAL (MISCELLANEOUS) ×8
SET TRI-LUMEN FLTR TB AIRSEAL (TUBING) ×3 IMPLANT
SOLUTION ELECTROLUBE (MISCELLANEOUS) ×3 IMPLANT
SPONGE GAUZE 2X2 STER 10/PKG (GAUZE/BANDAGES/DRESSINGS)
SPONGE LAP 4X18 X RAY DECT (DISPOSABLE) ×3 IMPLANT
STAPLE RELOAD 45 GRN (STAPLE) ×1 IMPLANT
STAPLE RELOAD 45MM GREEN (STAPLE) ×3
SUT ETHILON 3 0 PS 1 (SUTURE) ×3 IMPLANT
SUT MNCRL AB 4-0 PS2 18 (SUTURE) ×6 IMPLANT
SUT PDS AB 1 CT1 27 (SUTURE) ×6 IMPLANT
SUT VIC AB 2-0 SH 27 (SUTURE) ×3
SUT VIC AB 2-0 SH 27X BRD (SUTURE) ×1 IMPLANT
SUT VICRYL 0 UR6 27IN ABS (SUTURE) ×3 IMPLANT
SUT VLOC BARB 180 ABS3/0GR12 (SUTURE) ×9
SUTURE VLOC BRB 180 ABS3/0GR12 (SUTURE) ×3 IMPLANT
SYR 27GX1/2 1ML LL SAFETY (SYRINGE) ×3 IMPLANT
TOWEL OR 17X26 10 PK STRL BLUE (TOWEL DISPOSABLE) ×3 IMPLANT
TOWEL OR NON WOVEN STRL DISP B (DISPOSABLE) ×3 IMPLANT
WATER STERILE IRR 1000ML POUR (IV SOLUTION) ×4 IMPLANT

## 2017-09-13 NOTE — Progress Notes (Signed)
Patient ambulated around 160 feet in hallway using cane- tolerated well. States pain is "under control." Pt now sitting up in chair eating clear liquid tray.

## 2017-09-13 NOTE — Anesthesia Postprocedure Evaluation (Signed)
Anesthesia Post Note  Patient: DENMAN PICHARDO  Procedure(s) Performed: XI ROBOTIC ASSISTED LAPAROSCOPIC RADICAL PROSTATECTOMY, BILATERAL PELVIC LYMPH NODE DISSECTION (N/A )     Patient location during evaluation: PACU Anesthesia Type: General Level of consciousness: awake and alert Pain management: pain level controlled Vital Signs Assessment: post-procedure vital signs reviewed and stable Respiratory status: spontaneous breathing, nonlabored ventilation, respiratory function stable and patient connected to nasal cannula oxygen Cardiovascular status: blood pressure returned to baseline and stable Postop Assessment: no apparent nausea or vomiting Anesthetic complications: no    Last Vitals:  Vitals:   09/13/17 1200 09/13/17 1214  BP: (!) 126/92 (!) 129/91  Pulse: 89 94  Resp: 15 14  Temp: (!) 36.4 C   SpO2: 94% 94%    Last Pain:  Vitals:   09/13/17 1355  TempSrc:   PainSc: Asleep                 Ryan P Ellender

## 2017-09-13 NOTE — Transfer of Care (Signed)
Immediate Anesthesia Transfer of Care Note  Patient: Patrick Romero  Procedure(s) Performed: XI ROBOTIC ASSISTED LAPAROSCOPIC RADICAL PROSTATECTOMY, BILATERAL PELVIC LYMPH NODE DISSECTION (N/A )  Patient Location: PACU  Anesthesia Type:General  Level of Consciousness: awake, alert  and oriented  Airway & Oxygen Therapy: Patient Spontanous Breathing and Patient connected to face mask oxygen  Post-op Assessment: Report given to RN and Post -op Vital signs reviewed and stable  Post vital signs: Reviewed and stable  Last Vitals:  Vitals:   09/13/17 0650  BP: (!) 144/98  Pulse: 100  Resp: 18  Temp: 36.8 C  SpO2: 97%    Last Pain:  Vitals:   09/13/17 0650  TempSrc: Oral         Complications: No apparent anesthesia complications

## 2017-09-13 NOTE — Brief Op Note (Signed)
09/13/2017  11:03 AM  PATIENT:  Patrick Romero  57 y.o. male  PRE-OPERATIVE DIAGNOSIS:  PROSTATE CANCER  POST-OPERATIVE DIAGNOSIS:  PROSTATE CANCER  PROCEDURE:  Procedure(s): XI ROBOTIC ASSISTED LAPAROSCOPIC RADICAL PROSTATECTOMY, BILATERAL PELVIC LYMPH NODE DISSECTION (N/A)  SURGEON:  Surgeon(s) and Role:    * [name], MD - Primary  PHYSICIAN ASSISTANT:   ASSISTANTS: Debbrah Alar PA   ANESTHESIA:   local and general  EBL:  150 mL   BLOOD ADMINISTERED:none  DRAINS: JP to bulb, Foley to gravity   LOCAL MEDICATIONS USED:  MARCAINE     SPECIMEN:  Source of Specimen:  periprostatic fat, prostate, pelvic lymph nodes  DISPOSITION OF SPECIMEN:  PATHOLOGY  COUNTS:  YES  TOURNIQUET:  * No tourniquets in log *  DICTATION: .Other Dictation: Dictation Number  [id number]  PLAN OF CARE: Admit for overnight observation  PATIENT DISPOSITION:  PACU - hemodynamically stable.   Delay start of Pharmacological VTE agent (>24hrs) due to surgical blood loss or risk of bleeding: yes

## 2017-09-13 NOTE — H&P (Signed)
Patrick Romero is an 57 y.o. male.    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 1 artery, 2 vein (upper accessory) right renovascular anatomy. CMP normal.    2 - Moderate Risk Prostate Cancer - Gleason 7 adenocarcinoma in up to 50% of two cores RLB, RLA on eval PSA 4.9 and Rt induration. TRUS 45mL, no median lobe.   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 His PCP is Patrick Beets NP with [facility].   Today "Patrick Romero" is seen to proceed with robotic prostatecotmy with node dissection.     Past Medical History:  Diagnosis Date  . Arthritis    ra  . Cancer [facility])    prostate   . Cancer of kidney ([place]) 2018   part of right kidney removed  . Chronic back pain    lower back burns some too  . Chronic neck pain   . Depression   . Herniated disc, cervical   . Hypertension   . Sickle cell trait ([place])   . Stroke [facility])    weakness in right hand and leg , numbness on left     Past Surgical History:  Procedure Laterality Date  . ROBOTIC ASSITED PARTIAL NEPHRECTOMY Right 01/04/2017   Procedure: XI ROBOTIC ASSITED PARTIAL NEPHRECTOMY;  Surgeon: [name], MD;  Location: [facility];  Service: Urology;  Laterality: Right;    Family History  Problem Relation Age of Onset  . Hypertension Mother   . Lung cancer Father   . COPD Sister    Social History:  reports that he has been smoking cigarettes.  He has been smoking about 0.25 packs per day. he has never used smokeless tobacco. He reports that he drinks alcohol. He reports that he does not use drugs.  Allergies: No Known Allergies  No medications prior to admission.    No results found for this or any previous visit (from the past 48 hour(s)). No results found.  Review of Systems  Constitutional: Negative.  Negative for chills and  fever.  HENT: Negative.   Eyes: Negative.   Respiratory: Negative.   Cardiovascular: Negative.   Gastrointestinal: Negative.   Genitourinary: Negative.   Musculoskeletal: Positive for neck pain.  Skin: Negative.   Neurological: Negative.   Endo/Heme/Allergies: Negative.   Psychiatric/Behavioral: Negative.     There were no vitals taken for this visit. Physical Exam  Constitutional: He appears well-developed.  HENT:  Head: Normocephalic.  In neck brace as per baseline.   Eyes: Pupils are equal, round, and reactive to light.  Cardiovascular: Normal rate.  Respiratory: Effort normal.  GI: Soft.  Prior scars w/o hernias.   Genitourinary:  Genitourinary Comments: No CVAT  Musculoskeletal: Normal range of motion.  Neurological: He is alert.  Skin: Skin is warm.  Psychiatric: He has a normal mood and affect.     Assessment/Plan  Proceed as planned with robotic prostatectomy + ICG + nodes for moderate risk disease. Risks, benefits, alternatives, expected peri-op course discussed previously and reiterated today.    [name], MD 09/13/2017, 6:00 AM

## 2017-09-13 NOTE — Op Note (Signed)
NAMEGORDON, VANDUNK NO.:  [id number]  MEDICAL RECORD NO.:  [id number]  LOCATION:  [facility]                           FACILITY:  PHYSICIAN:  [name], MD     DATE OF BIRTH:  01-12-1961  DATE OF PROCEDURE:  09/13/2017                               OPERATIVE REPORT   DIAGNOSIS:  High-risk prostate cancer.  PROCEDURES: 1. Laparoscopic radical prostatectomy. 2. Bilateral pelvic lymphadenectomy. 3. Injection of indocyanine green dye for sentinel lymphangiography.  ESTIMATED BLOOD LOSS:  100 mL.  COMPLICATION:  None.  SPECIMEN: 1. Periprostatic fat. 2. Left external iliac lymph nodes. 3. Left obturator lymph node, sentinel. 4. Right external iliac lymph nodes. 5. Right obturator lymph nodes. 6. Radical prostatectomy.  ASSISTANT:  Debbrah Alar, PA.  FINDINGS:  Sentinel lymph nodes seen within the left obturator group.  DRAINS: 1. Foley catheter drained. 2. Jackson-Pratt drain to bulb suction.  INDICATION:  Mr. Caradine is a very pleasant, 57 year old gentleman with history of kidney cancer.  He is status post partial nephrectomy.  He was also found on GU evaluation to have an elevated PSA.  This appeared to be rising over time.  Prostate biopsy confirmed multifocal moderate risk adenocarcinoma of the prostate.  Options were discussed for therapy including ablative therapies versus surgical extirpation.  He wished to proceed with radical prostatectomy.  Informed consent was obtained and placed in the medical record.  PROCEDURE IN DETAIL:  The patient being Korry Dalgleish was verified. Procedure being radical prostatectomy was confirmed.  Procedure was carried out.  Time-out was performed.  Intravenous antibiotics were administered.  General endotracheal anesthesia was introduced. Exquisite care was taken to avoid radical positioning of his neck given known cervical spine disease.  He was further fashioned on the operating table using 3-inch tape over  foam padding across his supraxiphoid chest. A test of steep Trendelenburg positioning was performed and he was found to be suitably positioned.  He was placed into a low lithotomy position. Sterile field was created by prepping the patient's penis, perineum and proximal thighs using iodine and infra-xiphoid abdomen using chlorhexidine gluconate.  Foley catheter was placed to straight drain. Next, a high-flow, low-pressure pneumoperitoneum was obtained using Veress technique in the supraumbilical midline having passed the aspiration and drop test.  An 8-mm robotic camera port was placed in the same location.  Laparoscopic examination of peritoneal cavity revealed only some very thin omental adhesions in the right hemi-abdomen, a prior trocar site was noted.  This appeared to be without bowel containing or contributing to the surgery today.  Additional ports were placed as follows:  Right paramedian 8-mm robotic port, right far lateral 12-mm AirSeal port, right paramedian 5-mm suction port, left paramedian 8-mm robotic port, left far lateral 8-mm robotic port.  Robot was docked and passed through the electronic checks.  Attention was directed at development of space of Retzius.  Incision was made lateral to the left medial umbilical ligament from midline towards the internal ring and coursing along the iliac vessels towards the area of the ureter.  The left vas deferens was purposely ligated using medial bucket handle.  The left bladder wall was swept away from  the lateral aspect of the pelvic sidewall towards the endopelvic fascia on the left side.  A mirror-imaged dissection was performed on the right side. Anterior attachments were taken down with cautery scissors.  This exposed the anterior base of the prostate, which was defatted to better demarcate the bladder neck prostate junction.  This fat was set aside, labeled as periprostatic fat.  Next, 0.2 mL of indocyanine green dye  was injected into each lobe of the prostate using a percutaneously-placed, robotically-guided spinal needle with intervening suctioning to prevent dye spillage, which did not occur.  Next, the endopelvic fascia was swept away from the lateral aspect of the prostate in a base-to-apex orientation bilaterally.  This exposed the dorsal venous complex, which was very carefully controlled using vascular stapler, taking great care to avoid membranous urethral injury, which did not occur.  Then, approximately 10 minutes post dye injection, the pelvis was inspected under near-infrared fluorescence light.  Sentinel lymphangiography revealed several lymphatic channels coursing over the prostate bladder towards the pelvic lymph node fields.  There was a dominant area of sentinel lymph node in the left obturator groups respectively.  Next, template lymphadenectomy was performed on the left side.  First on the external iliac group with confines being left external iliac artery, vein, pelvic side wall, iliac bifurcation. Lymphostasis was achieved with cold clips, set aside, labeled as left external iliac lymph nodes.  Next, left obturator group was dissected with confines being left external iliac vein, pelvic sidewall, obturator nerve.  Exquisite care was taken to avoid injury to the obturator nerve, which did not occur.  Lymphostasis was achieved with cold clips, set aside, labeled left obturator lymph node, sentinel.  A mirror-imaged dissection was performed on the right side with the right external iliac and right obturator groups respectively.  Again, the right obturator nerve was also inspected and found to be uninjured.  There were no sentinel lymph nodes on the right side.  Attention was directed at bladder neck dissection.  The bladder neck was identified moving the Foley catheter back and forth.  A lateral release was performed and the bladder neck was separated from the base of the prostate  in the anterior- posterior direction keeping what appeared to be the circular rim of muscle fibers post the prostatectomy and bladder neck portions from the dissection.  Posterior dissection was performed by incising approximately 7 mm inferior-posterior to the posterior lip of the prostate, entering the plane of Denonvilliers.  The patient had very well-developed Denonvilliers fascia.  Bilateral seminal vesicles were dissected for approximately 4 cm, dissected, ligated, placed in gentle superior traction.  Bilateral seminal vesicles were dissected to their tips and placed on gentle superior traction.  Posterior dissection continued towards the area of the apex of the prostate.  This exposed the vascular pedicles on each side.  The left pedicles were controlled using a sequential clipping technique in a base-to-apex orientation, performing moderate nerve sparing on the left side.  On the right side, a purposeful wide dissection was performed, given the patient's known lateral prostate adenocarcinoma positivity.  Next, final apical dissection was performed in the anterior plane, placing the membranous urethra on stretch and transecting coldly.  This completely freed up the prostatectomy.  Specimens were placed into an EndoCatch bag for later retrieval.  Next, a digital rectal exam was performed under laparoscopic vision using indicator glove and no evidence of rectal violation was noted. Posterior dissection was performed using a single 3-0 V-Loc suture in a running fashion,  reapproximating the posterior urethral plate to the posterior bladder neck.  Mucosa-to-mucosa anastomosis was performed using double-armed V-Loc suture from the 6 o'clock to 12 o'clock position.  A new Foley catheter was then placed, which irrigated quantitatively.  A closed suction drain was then brought through the previous left lateral most robotic port site near the peritoneal cavity. The right lateral most  assistant port site was closed at the level of the fascia using 0 Vicryl and a using Carter-Thomason suture passer. Specimen was retrieved by extending the previous camera port site inferiorly for approximately 3 cm removing the prostatectomy specimen, setting aside for permanent pathology.  The extraction site was closed at the level of the fascia using figure-of-eight PDS x3.  All other reapproximation of Scarpa's with running Vicryl.  All incisions were infiltrated with dilute lyophilized Marcaine and closed at the level of the skin using subcuticular Monocryl followed by Dermabond.  The procedure was then terminated.  The patient tolerated the procedure well.  There were no immediate periprocedural complications.  The patient was taken to the postanesthesia care in stable condition.  Please note, first assistant, Debbrah Alar, was absolutely crucial for all robotic portions of the procedure today.  She provided invaluable retraction, vascular clipping, vascular stapling, suctioning; without which, this would not be possible.          ______________________________ [name], MD     TM/MEDQ  D:  09/13/2017  T:  09/13/2017  Job:  [id number]

## 2017-09-13 NOTE — Discharge Instructions (Signed)

## 2017-09-13 NOTE — Anesthesia Procedure Notes (Signed)
Procedure Name: Intubation Date/Time: 09/13/2017 8:42 AM Performed by: Gwyndolyn Saxon, CRNA Pre-anesthesia Checklist: Patient identified, Emergency Drugs available, Suction available, Patient being monitored and Timeout performed Patient Re-evaluated:Patient Re-evaluated prior to induction Oxygen Delivery Method: Circle system utilized Preoxygenation: Pre-oxygenation with 100% oxygen Induction Type: IV induction Ventilation: Mask ventilation without difficulty Laryngoscope Size: Miller and 2 Grade View: Grade I Tube type: Oral Tube size: 8.0 mm Number of attempts: 1 Placement Confirmation: ETT inserted through vocal cords under direct vision,  positive ETCO2,  CO2 detector and breath sounds checked- equal and bilateral Secured at: 23 cm Tube secured with: Tape Dental Injury: Teeth and Oropharynx as per pre-operative assessment

## 2017-09-13 NOTE — Op Note (Deleted)
  The note originally documented on this encounter has been moved the the encounter in which it belongs.  

## 2017-09-14 DIAGNOSIS — C61 Malignant neoplasm of prostate: Secondary | ICD-10-CM | POA: Diagnosis not present

## 2017-09-14 LAB — BASIC METABOLIC PANEL
Anion gap: 6 (ref 5–15)
BUN: 16 mg/dL (ref 6–20)
CALCIUM: 8.4 mg/dL — AB (ref 8.9–10.3)
CO2: 20 mmol/L — ABNORMAL LOW (ref 22–32)
Chloride: 106 mmol/L (ref 101–111)
Creatinine, Ser: 1.19 mg/dL (ref 0.61–1.24)
GFR calc Af Amer: >60 mL/min (ref >60)
GLUCOSE: 132 mg/dL — AB (ref 65–99)
Potassium: 6.2 mmol/L — ABNORMAL HIGH (ref 3.5–5.1)
Sodium: 132 mmol/L — ABNORMAL LOW (ref 135–145)

## 2017-09-14 LAB — HEMOGLOBIN AND HEMATOCRIT, BLOOD
HCT: 29.6 % — ABNORMAL LOW (ref 39.0–52.0)
Hemoglobin: 9.2 g/dL — ABNORMAL LOW (ref 13.0–17.0)

## 2017-09-14 LAB — CREATININE, FLUID (PLEURAL, PERITONEAL, JP DRAINAGE): Creat, Fluid: 1.2 mg/dL

## 2017-09-14 MED ORDER — SENNOSIDES-DOCUSATE SODIUM 8.6-50 MG PO TABS: 1.0000 | ORAL_TABLET | Freq: Two times a day (BID) | ORAL | 0 refills | Status: DC

## 2017-09-14 NOTE — Discharge Summary (Signed)
Physician Discharge Summary  Patient ID: Patrick Romero MRN: [id number] DOB/AGE: 03-06-1961 57 y.o.  Admit date: 09/13/2017 Discharge date: 09/14/2017  Admission Diagnoses: Prostate Cancer  Discharge Diagnoses:  Active Problems:   Prostate cancer [facility])   Discharged Condition: good  Hospital Course: Pt underwent robotic prostatectomy with ICG sentinel + template lymphadenectomy on 09/13/17, the day of admission, without acute complication. He was admitted to the 4th floor Urology service post-op where he began his vigorous recovery. By the afternoon of POD 1, the day of discharge, he is ambulatory, tollerating PO intake, pain controlled on PO meds and felt to be adequate for discharge. His JP drain was removed as output was low and Cr same as serum. DC Hgb >9. Surgical pathology pending.   Consults: None  Significant Diagnostic Studies: labs: as per above  Treatments: surgery:  As per above.   Discharge Exam: Blood pressure 121/82, pulse (!) 101, temperature 99 F (37.2 C), temperature source Oral, resp. rate 20, height 6' (1.829 m), weight 75.3 kg (166 lb), SpO2 95 %. General appearance: alert, cooperative and appears stated age Eyes: negative Nose: Nares normal. Septum midline. Mucosa normal. No drainage or sinus tenderness. Throat: lips, mucosa, and tongue normal; teeth and gums normal Neck: supple, symmetrical, trachea midline Back: symmetric, no curvature. ROM normal. No CVA tenderness. Resp: non-labored on romo air.  Cardio: very mild regular tachycardia.  GI: soft, non-tender; bowel sounds normal; no masses,  no organomegaly Male genitalia: normal, foley in place wtih yellow urine.  Extremities: extremities normal, atraumatic, no cyanosis or edema Pulses: 2+ and symmetric Skin: Skin color, texture, turgor normal. No rashes or lesions Lymph nodes: Cervical, supraclavicular, and axillary nodes normal. Neurologic: Grossly normal Incision/Wound: recent port / extraction sites  c/d/i. JP removed (output serosanguinous and non-foul) and dry dressing applied.   Disposition: 01-Home or Self Care   Allergies as of 09/14/2017   No Known Allergies     Medication List    STOP taking these medications   aspirin EC 81 MG tablet   ibuprofen 200 MG tablet Commonly known as:  ADVIL,MOTRIN   multivitamin with minerals Tabs tablet   traMADol 50 MG tablet Commonly known as:  ULTRAM     TAKE these medications   acetaminophen 500 MG tablet Commonly known as:  TYLENOL Take 1,000 mg by mouth every 6 (six) hours as needed for mild pain or headache.   amitriptyline 25 MG tablet Commonly known as:  ELAVIL Take 1 tablet (25 mg total) by mouth at bedtime.   amLODipine 5 MG tablet Commonly known as:  NORVASC Take one tablet by mouth once a day.   baclofen 20 MG tablet Commonly known as:  LIORESAL Take 1 tablet (20 mg total) by mouth 4 (four) times daily.   ferrous sulfate 325 (65 FE) MG EC tablet Take 1 tablet (325 mg total) by mouth 3 (three) times daily with meals. What changed:  when to take this   gabapentin 300 MG capsule Commonly known as:  NEURONTIN TAKE 2 CAPSULES 4 TIMES A DAY   HYDROcodone-acetaminophen 5-325 MG tablet Commonly known as:  NORCO Take 1-2 tablets by mouth every 6 (six) hours as needed for moderate pain or severe pain.   nicotine 21 mg/24hr patch Commonly known as:  NICODERM CQ - dosed in mg/24 hours Place 1 patch (21 mg total) onto the skin daily. What changed:  when to take this   senna-docusate 8.6-50 MG tablet Commonly known as:  Senokot-S Take 1 tablet  by mouth 2 (two) times daily. While taking strong pain meds to prevent constipation.   sulfamethoxazole-trimethoprim 800-160 MG tablet Commonly known as:  BACTRIM DS,SEPTRA DS Take 1 tablet by mouth 2 (two) times daily. Start the day prior to foley removal appointment      Follow-up Information    [name], MD On 09/25/2017.   Specialty:  Urology Why:  at 8:45 AM  for MD visit and catheter removal. Dr. Tresa Moore will call you with pathology results when available.  Contact information: [place] [address] [phone]           Signed: [name] 09/14/2017, 1:07 PM

## 2017-09-14 NOTE — Progress Notes (Signed)
Attempted to ambulate patient on hall way , patient refuses. Said he will ambulate later.

## 2017-10-31 ENCOUNTER — Encounter: Payer: Medicaid Other | Attending: Physical Medicine & Rehabilitation | Admitting: Physical Medicine & Rehabilitation

## 2017-10-31 ENCOUNTER — Other Ambulatory Visit: Payer: Self-pay

## 2017-10-31 ENCOUNTER — Encounter: Payer: Self-pay | Admitting: Physical Medicine & Rehabilitation

## 2017-10-31 VITALS — BP 105/72 | HR 127 | Ht 72.0 in | Wt 153.2 lb

## 2017-10-31 DIAGNOSIS — M25511 Pain in right shoulder: Secondary | ICD-10-CM | POA: Diagnosis not present

## 2017-10-31 DIAGNOSIS — M542 Cervicalgia: Secondary | ICD-10-CM | POA: Diagnosis not present

## 2017-10-31 DIAGNOSIS — S14129S Central cord syndrome at unspecified level of cervical spinal cord, sequela: Secondary | ICD-10-CM | POA: Diagnosis not present

## 2017-10-31 DIAGNOSIS — M792 Neuralgia and neuritis, unspecified: Secondary | ICD-10-CM | POA: Diagnosis not present

## 2017-10-31 DIAGNOSIS — R202 Paresthesia of skin: Secondary | ICD-10-CM

## 2017-10-31 DIAGNOSIS — G8929 Other chronic pain: Secondary | ICD-10-CM | POA: Insufficient documentation

## 2017-10-31 DIAGNOSIS — M545 Low back pain: Secondary | ICD-10-CM | POA: Diagnosis not present

## 2017-10-31 DIAGNOSIS — N281 Cyst of kidney, acquired: Secondary | ICD-10-CM | POA: Diagnosis not present

## 2017-10-31 DIAGNOSIS — F17210 Nicotine dependence, cigarettes, uncomplicated: Secondary | ICD-10-CM | POA: Diagnosis not present

## 2017-10-31 DIAGNOSIS — D649 Anemia, unspecified: Secondary | ICD-10-CM | POA: Diagnosis not present

## 2017-10-31 DIAGNOSIS — M79609 Pain in unspecified limb: Secondary | ICD-10-CM

## 2017-10-31 DIAGNOSIS — S14129A Central cord syndrome at unspecified level of cervical spinal cord, initial encounter: Secondary | ICD-10-CM | POA: Insufficient documentation

## 2017-10-31 MED ORDER — TIZANIDINE HCL 2 MG PO TABS: 2.0000 mg | ORAL_TABLET | Freq: Four times a day (QID) | ORAL | 3 refills | Status: DC | PRN

## 2017-10-31 NOTE — Patient Instructions (Signed)
REMEMBER TO MAINTAIN YOUR BALANCE AND POSTURE WHEN YOU WALK. THE LEFT NEEDS TO BE AS CLOSE TO THE RIGHT WHEN YOU AMBULATE

## 2017-10-31 NOTE — Progress Notes (Signed)
Subjective:    Patient ID: Patrick Romero, male    DOB: 05-17-1961, 57 y.o.   MRN: [id number]  HPI   Mr. Sleight is here in follow-up of his central cord syndrome and associated deficits. On 3/6 he had radical prostatectomy and lymph node dissection. He has had incontinence since the surgery which has been tough for him.   He is maintaing exercise at home. He walks daily outside the house. He is able to go up and down 5 steps with extra time  He continues to have spasms in his right arm and leg which give him difficulties at time. They seem to happen more at night or after he's exercises  He has been on tramadol for pain but changed to hydrocodone with this recent surgery. He is no longer using the hydrocodone and is about to resume the tramadol.     Pain Inventory Average Pain 8 Pain Right Now 8 My pain is sharp, burning, tingling and aching  In the last 24 hours, has pain interfered with the following? General activity 6 Relation with others 0 Enjoyment of life 0 What TIME of day is your pain at its worst? n/a Sleep (in general) n/a  Pain is worse with: walking, bending and some activites Pain improves with: heat/ice and therapy/exercise Relief from Meds: n/a  Mobility walk without assistance use a cane ability to climb steps?  yes do you drive?  yes  Function I need assistance with the following:  meal prep and household duties Do you have any goals in this area?  yes  Neuro/Psych numbness tremor tingling spasms  Prior Studies Any changes since last visit?  no  Physicians involved in your care Any changes since last visit?  no   Family History  Problem Relation Age of Onset  . Hypertension Mother   . Lung cancer Father   . COPD Sister    Social History   Socioeconomic History  . Marital status: Single    Spouse name: Not on file  . Number of children: Not on file  . Years of education: Not on file  . Highest education level: Not on file    Occupational History  . Not on file  Social Needs  . Financial resource strain: Not on file  . Food insecurity:    Worry: Not on file    Inability: Not on file  . Transportation needs:    Medical: Not on file    Non-medical: Not on file  Tobacco Use  . Smoking status: Current Every Day Smoker    Packs/day: 0.25    Types: Cigarettes  . Smokeless tobacco: Never Used  . Tobacco comment: pt is also using nicotine patch  Substance and Sexual Activity  . Alcohol use: Yes    Comment: occ beer   . Drug use: No  . Sexual activity: Yes  Lifestyle  . Physical activity:    Days per week: Not on file    Minutes per session: Not on file  . Stress: Not on file  Relationships  . Social connections:    Talks on phone: Not on file    Gets together: Not on file    Attends religious service: Not on file    Active member of club or organization: Not on file    Attends meetings of clubs or organizations: Not on file    Relationship status: Not on file  Other Topics Concern  . Not on file  Social History Narrative  .  Not on file   Past Surgical History:  Procedure Laterality Date  . ROBOT ASSISTED LAPAROSCOPIC RADICAL PROSTATECTOMY N/A 09/13/2017   Procedure: XI ROBOTIC ASSISTED LAPAROSCOPIC RADICAL PROSTATECTOMY, BILATERAL PELVIC LYMPH NODE DISSECTION;  Surgeon: [name], MD;  Location: [facility];  Service: Urology;  Laterality: N/A;  . ROBOTIC ASSITED PARTIAL NEPHRECTOMY Right 01/04/2017   Procedure: XI ROBOTIC ASSITED PARTIAL NEPHRECTOMY;  Surgeon: [name], MD;  Location: [facility];  Service: Urology;  Laterality: Right;   Past Medical History:  Diagnosis Date  . Arthritis    ra  . Cancer [facility])    prostate   . Cancer of kidney ([place]) 2018   part of right kidney removed  . Chronic back pain    lower back burns some too  . Chronic neck pain   . Depression   . Herniated disc, cervical   . Hypertension   . Sickle cell trait ([place])   . Stroke [facility])    weakness in right hand  and leg , numbness on left    There were no vitals taken for this visit.  Opioid Risk Score:   Fall Risk Score:  `1  Depression screen PHQ 2/9  Depression screen [facility] 2/9 10/31/2017 08/22/2017 06/27/2017 05/11/2017 02/17/2017 12/16/2016 11/23/2016  Decreased Interest 0 0 1 1 2 1 1   Down, Depressed, Hopeless 0 0 1 1 2 2 1   PHQ - 2 Score 0 0 2 2 4 3 2   Altered sleeping 0 1 - 2 2 1 3   Tired, decreased energy 0 1 - 2 2 2 1   Change in appetite 0 0 - 1 0 - 0  Feeling bad or failure about yourself  0 0 - 1 1 0 0  Trouble concentrating 0 0 - 1 2 0 0  Moving slowly or fidgety/restless 0 0 - 1 0 2 1  Suicidal thoughts 0 0 - 0 0 0 0  PHQ-9 Score 0 2 - 10 11 8 7   Difficult doing work/chores - - - - - - -    Review of Systems  Constitutional: Negative.   HENT: Negative.   Eyes: Negative.   Respiratory: Negative.   Cardiovascular: Negative for palpitations.  Gastrointestinal: Positive for abdominal pain.  Endocrine: Negative.   Genitourinary: Negative.   Musculoskeletal: Negative.   Skin: Positive for rash.  Allergic/Immunologic: Negative.   Neurological: Negative.   Hematological: Negative.   Psychiatric/Behavioral: Negative.   All other systems reviewed and are negative.      Objective:   Physical Exam General: No acute distress HEENT: EOMI, oral membranes moist Cards: reg rate  Chest: normal effort Abdomen: Soft, NT, ND Skin: dry, intact Extremities: no edema Neuro: Alert and oriented x 3+ to 4/5 left deltoid (r>L),4- to 4biceps,4triceps, wrist extension 4/5, hand grip 4/5 . 3-/5 right deltoid muscle, biceps,3 triceps, wrist extension. Hand grip 3.  LLE: 4-/5 at the ankle dorsiflexor, plantar flexor, 4 KE/HF RLE: 3+ HF, 3/5 KE and 3/5 ADF/PF.  Decreased sensation in U>L, R>L. gait stable but tends to dip to left during wb on left. Does better when cued  Musc:limited neck and right shoulder rom.  Psychpleasant Gen.: NAD      Assessment & Plan:  1.  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 --HEP       -cane for balance. Needs to focus on mechanics  2.  Pain Management:  -maintain elavil 25mg  qhs -gabapentin at 900mg  tid  -resumetramadol 50mg  q12 prn for severe pain (hydrocodone was given  per urology)          -We will continue the controlled substance monitoring program, this consists of regular clinic visits, examinations, routine drug screening, pill counts as well as use of [place] Controlled Substance Reporting System. [place] today. .            -he did not need new rx for tramadol.  3. Spasticity:  maintain baclofen at 20mg  QID -continue gait/strengthening/ROM          -will try zanaflex 2mg  q6 prn for breakthrough spasms 4. Anemia:follow up per primary  5. Right renal carcinoma s/p partial nephrectomy right (01/04/17) -scheduled to follow up with urology/oncology -Has scheduled prostatic surgery in March 6. . ETOH abuse/Tobacco: continue etoh abstinence 7. Right shoulder pain/capsulitis: continue stretching and HEP  40minutes of face to face patient care time were spent during this visit. All questions were encouraged and answered. Folllow up in 3 months.            Assessment & Plan:

## 2017-11-22 ENCOUNTER — Ambulatory Visit: Payer: Medicaid Other | Admitting: Family Medicine

## 2017-12-06 ENCOUNTER — Other Ambulatory Visit: Payer: Self-pay | Admitting: Physical Medicine & Rehabilitation

## 2017-12-08 ENCOUNTER — Telehealth: Payer: Self-pay

## 2017-12-08 MED ORDER — TRAMADOL HCL 50 MG PO TABS: ORAL_TABLET | ORAL | 2 refills | Status: DC

## 2017-12-08 NOTE — Telephone Encounter (Signed)
Pt called requesting refill on Tramadol 50mg  #60. Last filled 10/28/17. Next appt 02/05/18

## 2017-12-08 NOTE — Telephone Encounter (Signed)
rx sent to pharmacy

## 2017-12-26 ENCOUNTER — Ambulatory Visit: Payer: Medicaid Other | Admitting: Internal Medicine

## 2017-12-29 ENCOUNTER — Other Ambulatory Visit (HOSPITAL_COMMUNITY): Payer: Self-pay | Admitting: Urology

## 2017-12-29 ENCOUNTER — Ambulatory Visit (HOSPITAL_COMMUNITY)
Admission: RE | Admit: 2017-12-29 | Discharge: 2017-12-29 | Disposition: A | Discharge status: Home / Self Care | Payer: Medicaid Other | Source: Ambulatory Visit | Attending: Urology | Admitting: Urology

## 2017-12-29 DIAGNOSIS — C641 Malignant neoplasm of right kidney, except renal pelvis: Secondary | ICD-10-CM | POA: Diagnosis not present

## 2018-01-02 ENCOUNTER — Telehealth: Payer: Self-pay | Admitting: Family Medicine

## 2018-01-02 NOTE — Telephone Encounter (Signed)
Call placed to patient [phone], regarding [place] form. Informed patient that we received form via fax for [facility] services and explained to patient that no provider will be able to sign off form until he comes for his appointment on 01/26/2018 @ 2:30pm with Dr. Wynetta Emery. After appointment provider will determine if he is eligible for services. Patient understood and had no further questions.

## 2018-01-26 ENCOUNTER — Ambulatory Visit: Payer: Medicaid Other | Attending: Internal Medicine | Admitting: Internal Medicine

## 2018-01-26 ENCOUNTER — Encounter: Payer: Self-pay | Admitting: Internal Medicine

## 2018-01-26 VITALS — BP 134/90 | HR 104 | Temp 98.8°F | Resp 16 | Ht 72.0 in | Wt 148.6 lb

## 2018-01-26 DIAGNOSIS — I10 Essential (primary) hypertension: Secondary | ICD-10-CM

## 2018-01-26 DIAGNOSIS — Z8249 Family history of ischemic heart disease and other diseases of the circulatory system: Secondary | ICD-10-CM | POA: Diagnosis not present

## 2018-01-26 DIAGNOSIS — Z801 Family history of malignant neoplasm of trachea, bronchus and lung: Secondary | ICD-10-CM | POA: Diagnosis not present

## 2018-01-26 DIAGNOSIS — G8250 Quadriplegia, unspecified: Secondary | ICD-10-CM | POA: Diagnosis not present

## 2018-01-26 DIAGNOSIS — M542 Cervicalgia: Secondary | ICD-10-CM | POA: Insufficient documentation

## 2018-01-26 DIAGNOSIS — G8929 Other chronic pain: Secondary | ICD-10-CM | POA: Diagnosis not present

## 2018-01-26 DIAGNOSIS — Z9079 Acquired absence of other genital organ(s): Secondary | ICD-10-CM | POA: Diagnosis not present

## 2018-01-26 DIAGNOSIS — Z8546 Personal history of malignant neoplasm of prostate: Secondary | ICD-10-CM | POA: Insufficient documentation

## 2018-01-26 DIAGNOSIS — Z1211 Encounter for screening for malignant neoplasm of colon: Secondary | ICD-10-CM | POA: Diagnosis not present

## 2018-01-26 DIAGNOSIS — F17210 Nicotine dependence, cigarettes, uncomplicated: Secondary | ICD-10-CM | POA: Insufficient documentation

## 2018-01-26 DIAGNOSIS — Z79899 Other long term (current) drug therapy: Secondary | ICD-10-CM | POA: Insufficient documentation

## 2018-01-26 DIAGNOSIS — Z79891 Long term (current) use of opiate analgesic: Secondary | ICD-10-CM | POA: Insufficient documentation

## 2018-01-26 DIAGNOSIS — F17200 Nicotine dependence, unspecified, uncomplicated: Secondary | ICD-10-CM

## 2018-01-26 DIAGNOSIS — Z85528 Personal history of other malignant neoplasm of kidney: Secondary | ICD-10-CM | POA: Diagnosis not present

## 2018-01-26 DIAGNOSIS — S14129D Central cord syndrome at unspecified level of cervical spinal cord, subsequent encounter: Secondary | ICD-10-CM | POA: Diagnosis not present

## 2018-01-26 DIAGNOSIS — X58XXXD Exposure to other specified factors, subsequent encounter: Secondary | ICD-10-CM | POA: Diagnosis not present

## 2018-01-26 DIAGNOSIS — D649 Anemia, unspecified: Secondary | ICD-10-CM | POA: Diagnosis not present

## 2018-01-26 NOTE — Patient Instructions (Signed)

## 2018-01-26 NOTE — Progress Notes (Signed)
Pt states his pain is coming from his right side

## 2018-01-26 NOTE — Progress Notes (Signed)
Patient ID: Patrick Romero, male    DOB: [birth year]  MRN: [id number]  CC: re-establish and Hypertension   Subjective: Patrick Romero is a 57 y.o. male who presents for chronic disease management and to establish with me as PCP. His concerns today include:  Patient with history of HTN, tobacco dependence, renal carcinoma status post partial nephrectomy 12/2016, prostate CA status post prostatectomy with LN dissection, central cord syndrome (08/2016 cervical injury from fall) and incomplete quadriparesis (followed by PMR), anemia,  ETOH   HTN:  Compliant with Norvasc.  Tries to limit salt but can do better.  Denies any chest pains or shortness of breath.  Anemia:  Takes a MV.  Stopped iron when he ran out of last prescription.  Endorses tiredness and dizziness at times.  C-scope checked off on HM but pt states he never had one. Moving bowels okay.  No blood in stools.   Central Cord Syndrome: Patient is requesting PCS services.  He has weakness and numbness in the extremities.   Worse on RT side.  Needs help getting in and out of tub, shaving and putting on clothes.  Has shower bench.  Mother prepares meals but she is 78 yrs old and starting to slow down.  Able to feed self.  Able to get on and off commode.    Tob dep:  Down to 1/2 pk from 2 pks a day.  Using nicotine lozengers to help with smoking cessation efforts.  Patient Active Problem List   Diagnosis Date Noted  . Prostate cancer ([place]) 09/13/2017  . Hypertension 02/17/2017  . Renal mass 01/04/2017  . Renal carcinoma, right ([place]) 11/23/2016  . Atherosclerosis of aorta ([place]) 11/23/2016  . 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 Acute blood loss anemia   . Dysesthesia   . History of syncope   . 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 Tetraparesis ([place])   . Central cord syndrome ([place])   . Anemia   . Renal cyst   . ETOH abuse   . Chest wall pain   . Neuropathic pain   . Neck pain   . Chronic neck and back  pain 09/01/2016  . Alcohol intoxication ([place]) 09/01/2016  . Tobacco abuse 09/01/2016     Current Outpatient Medications on File Prior to Visit  Medication Sig Dispense Refill  . amitriptyline (ELAVIL) 25 MG tablet Take 1 tablet (25 mg total) by mouth at bedtime. 30 tablet 4  . amLODipine (NORVASC) 5 MG tablet Take one tablet by mouth once a day. 30 tablet 4  . baclofen (LIORESAL) 20 MG tablet Take 1 tablet (20 mg total) by mouth 4 (four) times daily. 120 tablet 5  . ferrous sulfate 325 (65 FE) MG EC tablet Take 1 tablet (325 mg total) by mouth 3 (three) times daily with meals. (Patient taking differently: Take 325 mg by mouth daily with breakfast. ) 90 tablet 2  . gabapentin (NEURONTIN) 300 MG capsule TAKE 2 CAPSULES 4 TIMES A DAY 240 capsule 2  . nicotine (NICODERM CQ - DOSED IN MG/24 HOURS) 21 mg/24hr patch Place 1 patch (21 mg total) onto the skin daily. (Patient taking differently: Place 21 mg onto the skin 2 (two) times a week. ) 28 patch 1  . tiZANidine (ZANAFLEX) 2 MG tablet Take 1 tablet (2 mg total) by mouth every 6 (six) hours as needed for muscle spasms. 60 tablet 3  . traMADol (ULTRAM) 50 MG tablet TAKE 1 TABLET EVERY 12  HOURS AS NEEDED FOR PAIN 60 tablet 2   No current facility-administered medications on file prior to visit.     No Known Allergies  Social History   Socioeconomic History  . Marital status: Single    Spouse name: Not on file  . Number of children: Not on file  . Years of education: Not on file  . Highest education level: Not on file  Occupational History  . Not on file  Social Needs  . Financial resource strain: Not on file  . Food insecurity:    Worry: Not on file    Inability: Not on file  . Transportation needs:    Medical: Not on file    Non-medical: Not on file  Tobacco Use  . Smoking status: Current Every Day Smoker    Packs/day: 0.25    Types: Cigarettes  . Smokeless tobacco: Never Used  . Tobacco comment: pt is also using nicotine patch   Substance and Sexual Activity  . Alcohol use: Yes    Comment: occ beer   . Drug use: No  . Sexual activity: Yes  Lifestyle  . Physical activity:    Days per week: Not on file    Minutes per session: Not on file  . Stress: Not on file  Relationships  . Social connections:    Talks on phone: Not on file    Gets together: Not on file    Attends religious service: Not on file    Active member of club or organization: Not on file    Attends meetings of clubs or organizations: Not on file    Relationship status: Not on file  . Intimate partner violence:    Fear of current or ex partner: Not on file    Emotionally abused: Not on file    Physically abused: Not on file    Forced sexual activity: Not on file  Other Topics Concern  . Not on file  Social History Narrative  . Not on file    Family History  Problem Relation Age of Onset  . Hypertension Mother   . Lung cancer Father   . COPD Sister     Past Surgical History:  Procedure Laterality Date  . ROBOT ASSISTED LAPAROSCOPIC RADICAL PROSTATECTOMY N/A 09/13/2017   Procedure: XI ROBOTIC ASSISTED LAPAROSCOPIC RADICAL PROSTATECTOMY, BILATERAL PELVIC LYMPH NODE DISSECTION;  Surgeon: [name], MD;  [facility];  Service: Urology;  Laterality: N/A;  . ROBOTIC ASSITED PARTIAL NEPHRECTOMY Right 01/04/2017   Procedure: XI ROBOTIC ASSITED PARTIAL NEPHRECTOMY;  Surgeon: [name], MD;  [facility];  Service: Urology;  Laterality: Right;    ROS: Review of Systems Negative except as stated above   PHYSICAL EXAM: BP 134/90   Pulse (!) 104   Temp 98.8 F (37.1 C) (Oral)   Resp 16   Ht 6' (1.829 m)   Wt 148 lb 9.6 oz (67.4 kg)   SpO2 97%   BMI 20.15 kg/m   Physical Exam  General appearance - alert, well appearing, and in no distress Mental status - normal mood, behavior, speech, dress, motor activity, and thought processes Neck -no cervical lymphadenopathy.  No thyromegaly or nodules. Chest - clear to  auscultation, no wheezes, rales or rhonchi, symmetric air entry Heart - normal rate, regular rhythm, normal S1, S2, no murmurs, rubs, clicks or gallops Neurological -mild contracture at the right wrist.  Grip 3/5 right, 4/5 left.  Power RUE 3/5 proximal and distal.  LUE 4/5 proximal and  distal.  Lower extremities: RLE 3/5, LLE 4+/5 Extremities -no lower extremity edema   ASSESSMENT AND PLAN: 1. Essential hypertension Acceptable.  Continue amlodipine  2. Tobacco dependence Advised to quit.  Commended him on his efforts so far.  He will continue using the nicotine lozenges  3. Anemia, unspecified type - CBC - Iron, TIBC and Ferritin Panel  4. Quadriparesis ([place]) 5. Central cord syndrome, subsequent encounter [facility]) Based on history, physical exam findings and diagnoses I think it is appropriate for [facility] services  6. Colon cancer screening - Ambulatory referral to Gastroenterology   Patient was given the opportunity to ask questions.  Patient verbalized understanding of the plan and was able to repeat key elements of the plan.   Orders Placed This Encounter  Procedures  . CBC  . Iron, TIBC and Ferritin Panel  . Ambulatory referral to Gastroenterology     Requested Prescriptions    No prescriptions requested or ordered in this encounter    Return in about 4 months (around 05/29/2018).  Karle Plumber, MD, FACP

## 2018-01-27 LAB — CBC
Hematocrit: 34.7 % — ABNORMAL LOW (ref 37.5–51.0)
Hemoglobin: 10.8 g/dL — ABNORMAL LOW (ref 13.0–17.7)
MCH: 25.1 pg — ABNORMAL LOW (ref 26.6–33.0)
MCHC: 31.1 g/dL — AB (ref 31.5–35.7)
MCV: 81 fL (ref 79–97)
PLATELETS: 360 10*3/uL (ref 150–450)
RBC: 4.31 x10E6/uL (ref 4.14–5.80)
RDW: 14.1 % (ref 12.3–15.4)
WBC: 7.4 10*3/uL (ref 3.4–10.8)

## 2018-01-27 LAB — IRON,TIBC AND FERRITIN PANEL
Ferritin: 126 ng/mL (ref 30–400)
Iron Saturation: 30 % (ref 15–55)
Iron: 82 ug/dL (ref 38–169)
TIBC: 274 ug/dL (ref 250–450)
UIBC: 192 ug/dL (ref 111–343)

## 2018-01-29 ENCOUNTER — Telehealth: Payer: Self-pay

## 2018-01-29 NOTE — Telephone Encounter (Signed)
Contacted pt to go over lab results pt is aware and doesn't have any questions or concerns 

## 2018-01-30 ENCOUNTER — Encounter: Payer: Medicaid Other | Admitting: Physical Medicine & Rehabilitation

## 2018-01-31 ENCOUNTER — Telehealth: Payer: Self-pay

## 2018-01-31 NOTE — Telephone Encounter (Signed)
PCS referral faxed to [facility]

## 2018-02-02 ENCOUNTER — Telehealth: Payer: Self-pay

## 2018-02-02 NOTE — Telephone Encounter (Signed)
Call placed to [facility], spoke to [place] and confirmed receipt of the referral.  He stated that they will be contacting the patient to schedule an assessment.

## 2018-02-05 ENCOUNTER — Encounter: Payer: Medicaid Other | Attending: Physical Medicine & Rehabilitation | Admitting: Physical Medicine & Rehabilitation

## 2018-02-05 ENCOUNTER — Encounter: Payer: Self-pay | Admitting: Physical Medicine & Rehabilitation

## 2018-02-05 ENCOUNTER — Other Ambulatory Visit: Payer: Self-pay

## 2018-02-05 VITALS — BP 102/73 | HR 102 | Ht 72.0 in | Wt 147.8 lb

## 2018-02-05 DIAGNOSIS — Z79891 Long term (current) use of opiate analgesic: Secondary | ICD-10-CM

## 2018-02-05 DIAGNOSIS — G8250 Quadriplegia, unspecified: Secondary | ICD-10-CM | POA: Diagnosis not present

## 2018-02-05 DIAGNOSIS — D573 Sickle-cell trait: Secondary | ICD-10-CM | POA: Insufficient documentation

## 2018-02-05 DIAGNOSIS — Z8249 Family history of ischemic heart disease and other diseases of the circulatory system: Secondary | ICD-10-CM | POA: Diagnosis not present

## 2018-02-05 DIAGNOSIS — I10 Essential (primary) hypertension: Secondary | ICD-10-CM | POA: Diagnosis not present

## 2018-02-05 DIAGNOSIS — M7591 Shoulder lesion, unspecified, right shoulder: Secondary | ICD-10-CM | POA: Insufficient documentation

## 2018-02-05 DIAGNOSIS — R202 Paresthesia of skin: Secondary | ICD-10-CM

## 2018-02-05 DIAGNOSIS — M792 Neuralgia and neuritis, unspecified: Secondary | ICD-10-CM | POA: Diagnosis not present

## 2018-02-05 DIAGNOSIS — X58XXXA Exposure to other specified factors, initial encounter: Secondary | ICD-10-CM | POA: Insufficient documentation

## 2018-02-05 DIAGNOSIS — S14129A Central cord syndrome at unspecified level of cervical spinal cord, initial encounter: Secondary | ICD-10-CM | POA: Diagnosis not present

## 2018-02-05 DIAGNOSIS — M79609 Pain in unspecified limb: Secondary | ICD-10-CM

## 2018-02-05 DIAGNOSIS — F1010 Alcohol abuse, uncomplicated: Secondary | ICD-10-CM | POA: Insufficient documentation

## 2018-02-05 DIAGNOSIS — M7501 Adhesive capsulitis of right shoulder: Secondary | ICD-10-CM

## 2018-02-05 DIAGNOSIS — D649 Anemia, unspecified: Secondary | ICD-10-CM | POA: Diagnosis not present

## 2018-02-05 DIAGNOSIS — R200 Anesthesia of skin: Secondary | ICD-10-CM | POA: Insufficient documentation

## 2018-02-05 DIAGNOSIS — I69398 Other sequelae of cerebral infarction: Secondary | ICD-10-CM | POA: Diagnosis not present

## 2018-02-05 DIAGNOSIS — Z801 Family history of malignant neoplasm of trachea, bronchus and lung: Secondary | ICD-10-CM | POA: Diagnosis not present

## 2018-02-05 DIAGNOSIS — Z825 Family history of asthma and other chronic lower respiratory diseases: Secondary | ICD-10-CM | POA: Diagnosis not present

## 2018-02-05 DIAGNOSIS — Z9079 Acquired absence of other genital organ(s): Secondary | ICD-10-CM | POA: Insufficient documentation

## 2018-02-05 DIAGNOSIS — Z8546 Personal history of malignant neoplasm of prostate: Secondary | ICD-10-CM | POA: Diagnosis not present

## 2018-02-05 DIAGNOSIS — Z905 Acquired absence of kidney: Secondary | ICD-10-CM | POA: Diagnosis not present

## 2018-02-05 DIAGNOSIS — G894 Chronic pain syndrome: Secondary | ICD-10-CM | POA: Diagnosis not present

## 2018-02-05 DIAGNOSIS — Z5181 Encounter for therapeutic drug level monitoring: Secondary | ICD-10-CM

## 2018-02-05 DIAGNOSIS — F17210 Nicotine dependence, cigarettes, uncomplicated: Secondary | ICD-10-CM | POA: Diagnosis not present

## 2018-02-05 DIAGNOSIS — Z85528 Personal history of other malignant neoplasm of kidney: Secondary | ICD-10-CM | POA: Diagnosis not present

## 2018-02-05 DIAGNOSIS — S14129S Central cord syndrome at unspecified level of cervical spinal cord, sequela: Secondary | ICD-10-CM

## 2018-02-05 MED ORDER — HYDROCODONE-ACETAMINOPHEN 5-325 MG PO TABS: 1.0000 | ORAL_TABLET | Freq: Four times a day (QID) | ORAL | 0 refills | Status: DC | PRN

## 2018-02-05 MED ORDER — TIZANIDINE HCL 2 MG PO TABS: 2.0000 mg | ORAL_TABLET | Freq: Four times a day (QID) | ORAL | 3 refills | Status: DC

## 2018-02-05 NOTE — Progress Notes (Signed)
Subjective:    Patient ID: Patrick Romero, male    DOB: [birth year], 57 y.o.   MRN: [id number]  HPI   [name] is here in follow up of his central cord syndrome and associated mobility deficits and pain. He states that his dysesthesias and spasms have not improved with the most recent changes we made including. He is working on [employer], particularly on right shoulder. He uses a quad cane for balance.   Tramadol doesn't seem to help pain. He maintains on gabapentin and elavil as ordered. He's only using zanaflex bid.   Pain Inventory Average Pain 9 Pain Right Now 9 My pain is sharp, burning, stabbing and tingling  In the last 24 hours, has pain interfered with the following? General activity 7 Relation with others 7 Enjoyment of life 7 What TIME of day is your pain at its worst? all Sleep (in general) Poor  Pain is worse with: walking, sitting and standing Pain improves with: medication Relief from Meds: 0  Mobility walk with assistance use a cane do you drive?  no needs help with transfers  Function Do you have any goals in this area?  no  Neuro/Psych bladder control problems weakness numbness tremor tingling trouble walking spasms dizziness  Prior Studies Any changes since last visit?  yes x-rays CT/MRI  Physicians involved in your care Any changes since last visit?  no   Family History  Problem Relation Age of Onset  . Hypertension Mother   . Lung cancer Father   . COPD Sister    Social History   Socioeconomic History  . Marital status: Single    Spouse name: Not on file  . Number of children: Not on file  . Years of education: Not on file  . Highest education level: Not on file  Occupational History  . Not on file  Social Needs  . Financial resource strain: Not on file  . Food insecurity:    Worry: Not on file    Inability: Not on file  . Transportation needs:    Medical: Not on file    Non-medical: Not on file  Tobacco Use  . Smoking  status: Current Every Day Smoker    Packs/day: 0.25    Types: Cigarettes  . Smokeless tobacco: Never Used  . Tobacco comment: pt is also using nicotine patch  Substance and Sexual Activity  . Alcohol use: Yes    Comment: occ beer   . Drug use: No  . Sexual activity: Yes  Lifestyle  . Physical activity:    Days per week: Not on file    Minutes per session: Not on file  . Stress: Not on file  Relationships  . Social connections:    Talks on phone: Not on file    Gets together: Not on file    Attends religious service: Not on file    Active member of club or organization: Not on file    Attends meetings of clubs or organizations: Not on file    Relationship status: Not on file  Other Topics Concern  . Not on file  Social History Narrative  . Not on file   Past Surgical History:  Procedure Laterality Date  . ROBOT ASSISTED LAPAROSCOPIC RADICAL PROSTATECTOMY N/A 09/13/2017   Procedure: XI ROBOTIC ASSISTED LAPAROSCOPIC RADICAL PROSTATECTOMY, BILATERAL PELVIC LYMPH NODE DISSECTION;  Surgeon: [name], MD;  [facility];  Service: Urology;  Laterality: N/A;  . ROBOTIC ASSITED PARTIAL NEPHRECTOMY Right 01/04/2017  Procedure: XI ROBOTIC ASSITED PARTIAL NEPHRECTOMY;  Surgeon: [name], MD;  [facility];  Service: Urology;  Laterality: Right;   Past Medical History:  Diagnosis Date  . Arthritis    ra  . Cancer [facility])    prostate   . Cancer of kidney ([place]) 2018   part of right kidney removed  . Chronic back pain    lower back burns some too  . Chronic neck pain   . Depression   . Herniated disc, cervical   . Hypertension   . Sickle cell trait ([place])   . Stroke [facility])    weakness in right hand and leg , numbness on left    BP 102/73   Pulse (!) 102   Ht 6' (1.829 m)   Wt 147 lb 12.8 oz (67 kg)   SpO2 96%   BMI 20.05 kg/m   Opioid Risk Score:   Fall Risk Score:  `1  Depression screen PHQ 2/9  Depression screen [facility] 2/9 02/05/2018 01/26/2018 10/31/2017  08/22/2017 06/27/2017 05/11/2017 02/17/2017  Decreased Interest 1 2 0 0 1 1 2   Down, Depressed, Hopeless 1 2 0 0 1 1 2   PHQ - 2 Score 2 4 0 0 2 2 4   Altered sleeping - 2 0 1 - 2 2  Tired, decreased energy - 1 0 1 - 2 2  Change in appetite - (No Data) 0 0 - 1 0  Feeling bad or failure about yourself  - 0 0 0 - 1 1  Trouble concentrating - 2 0 0 - 1 2  Moving slowly or fidgety/restless - 2 0 0 - 1 0  Suicidal thoughts - 0 0 0 - 0 0  PHQ-9 Score - 11 0 2 - 10 11  Difficult doing work/chores - - - - - - -    Review of Systems  Constitutional: Negative.   HENT: Negative.   Eyes: Negative.   Respiratory: Positive for shortness of breath.   Cardiovascular: Negative.   Gastrointestinal: Negative.   Endocrine: Negative.   Genitourinary: Negative.   Musculoskeletal: Negative.   Skin: Negative.   Allergic/Immunologic: Negative.   Neurological: Negative.   Hematological: Negative.   Psychiatric/Behavioral: Negative.   All other systems reviewed and are negative.      Objective:   Physical Exam General: No acute distress HEENT: EOMI, oral membranes moist Cards: reg rate  Chest: normal effort Abdomen: Soft, NT, ND Skin: dry, intact Extremities: no edema Neuro: Alert and oriented x 3+ to 4/5 left deltoid (r>L),4- to 4biceps,4triceps, wrist extension 4/5, hand grip 4/5 . 3-/5 right deltoid muscle, biceps,3 triceps, wrist extension. Hand grip 3.  LLE: 4-/5 at the ankle dorsiflexor, plantar flexor, 4 KE/HF RLE: 3+ HF, 3/5 KE and 3/5 ADF/PF--motor exam stable.  Decreased sensation to LT R>L gait stable but tends to dip to left during wb on left. Does better when cued  Musc:limited neck and right shoulder rom. Pain with rotator cuff movements.  Psychpleasant Gen.: NAD      Assessment & Plan:  1. 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 --HEP              -continue cane for balance. Needs to focus on mechanics  2.  Pain  Management:  -continue elavil 25mg  qhs -gabapentin at 900mg  tid  -begin hydrocodone for breakthrough pain.  -We will continue the controlled substance monitoring program, this consists of regular clinic visits, examinations, routine drug screening, pill counts as well as use  of [place] Controlled Substance Reporting System. [place] today.    -UDS today 3. Spasticity:  maintain baclofen at20mg  QID -continue gait/strengthening/ROM -increase zanaflex to 2mg  qid 4. Anemia:follow up per primary  5. Right renal carcinoma s/p partial nephrectomy right (01/04/17), prostate cancer  -follow up with urology/oncology 6. . ETOH abuse/Tobacco: continue etoh abstinence 7. Right shoulder pain/capsulitis: continue stretching and HEP  -After informed consent and preparation of the skin with betadine and isopropyl alcohol, I injected 6mg  (1cc) of celestone and 4cc of 1% lidocaine into the right shoulder via lateral/posterior approach. Additionally, aspiration was performed prior to injection. The patient tolerated well, and no complications were encountered. Afterward the area was cleaned and dressed. Post- injection instructions were provided.    63minutes of face to face patient care time were spent during this visit. All questions were encouraged and answered. Folllow up in 1 month with me or NP

## 2018-02-05 NOTE — Patient Instructions (Signed)
PLEASE FEEL FREE TO CALL OUR OFFICE WITH ANY PROBLEMS OR QUESTIONS ([phone])      

## 2018-02-15 ENCOUNTER — Telehealth: Payer: Self-pay | Admitting: *Deleted

## 2018-02-15 LAB — TOXASSURE SELECT,+ANTIDEPR,UR

## 2018-02-15 NOTE — Telephone Encounter (Signed)
Urine drug screen is inconsistent. There was no medication present and positive for alcohol.

## 2018-02-16 NOTE — Telephone Encounter (Signed)
Letter mailed

## 2018-02-16 NOTE — Telephone Encounter (Signed)
Please send him a formal warning. If another inconsistent test occurs, he will be non-narcotic.

## 2018-03-05 ENCOUNTER — Other Ambulatory Visit: Payer: Self-pay | Admitting: Physical Medicine & Rehabilitation

## 2018-03-05 DIAGNOSIS — M79609 Pain in unspecified limb: Secondary | ICD-10-CM

## 2018-03-05 DIAGNOSIS — R202 Paresthesia of skin: Principal | ICD-10-CM

## 2018-03-06 ENCOUNTER — Encounter: Payer: Self-pay | Admitting: Internal Medicine

## 2018-03-08 ENCOUNTER — Encounter: Payer: Self-pay | Admitting: Registered Nurse

## 2018-03-08 ENCOUNTER — Encounter: Payer: Medicaid Other | Attending: Registered Nurse | Admitting: Registered Nurse

## 2018-03-08 VITALS — BP 103/74 | HR 101 | Ht 72.0 in | Wt 147.0 lb

## 2018-03-08 DIAGNOSIS — Z79891 Long term (current) use of opiate analgesic: Secondary | ICD-10-CM | POA: Diagnosis not present

## 2018-03-08 DIAGNOSIS — F17210 Nicotine dependence, cigarettes, uncomplicated: Secondary | ICD-10-CM | POA: Insufficient documentation

## 2018-03-08 DIAGNOSIS — D649 Anemia, unspecified: Secondary | ICD-10-CM | POA: Insufficient documentation

## 2018-03-08 DIAGNOSIS — Z5181 Encounter for therapeutic drug level monitoring: Secondary | ICD-10-CM | POA: Diagnosis not present

## 2018-03-08 DIAGNOSIS — Z8249 Family history of ischemic heart disease and other diseases of the circulatory system: Secondary | ICD-10-CM | POA: Insufficient documentation

## 2018-03-08 DIAGNOSIS — Z85528 Personal history of other malignant neoplasm of kidney: Secondary | ICD-10-CM | POA: Insufficient documentation

## 2018-03-08 DIAGNOSIS — M7591 Shoulder lesion, unspecified, right shoulder: Secondary | ICD-10-CM | POA: Insufficient documentation

## 2018-03-08 DIAGNOSIS — G894 Chronic pain syndrome: Secondary | ICD-10-CM

## 2018-03-08 DIAGNOSIS — F1010 Alcohol abuse, uncomplicated: Secondary | ICD-10-CM | POA: Diagnosis not present

## 2018-03-08 DIAGNOSIS — S14129S Central cord syndrome at unspecified level of cervical spinal cord, sequela: Secondary | ICD-10-CM | POA: Diagnosis not present

## 2018-03-08 DIAGNOSIS — R202 Paresthesia of skin: Secondary | ICD-10-CM

## 2018-03-08 DIAGNOSIS — Z825 Family history of asthma and other chronic lower respiratory diseases: Secondary | ICD-10-CM | POA: Diagnosis not present

## 2018-03-08 DIAGNOSIS — G8250 Quadriplegia, unspecified: Secondary | ICD-10-CM | POA: Insufficient documentation

## 2018-03-08 DIAGNOSIS — S14129A Central cord syndrome at unspecified level of cervical spinal cord, initial encounter: Secondary | ICD-10-CM | POA: Diagnosis not present

## 2018-03-08 DIAGNOSIS — Z8546 Personal history of malignant neoplasm of prostate: Secondary | ICD-10-CM | POA: Diagnosis not present

## 2018-03-08 DIAGNOSIS — Z9079 Acquired absence of other genital organ(s): Secondary | ICD-10-CM | POA: Insufficient documentation

## 2018-03-08 DIAGNOSIS — D573 Sickle-cell trait: Secondary | ICD-10-CM | POA: Insufficient documentation

## 2018-03-08 DIAGNOSIS — R200 Anesthesia of skin: Secondary | ICD-10-CM | POA: Insufficient documentation

## 2018-03-08 DIAGNOSIS — Z801 Family history of malignant neoplasm of trachea, bronchus and lung: Secondary | ICD-10-CM | POA: Diagnosis not present

## 2018-03-08 DIAGNOSIS — M7501 Adhesive capsulitis of right shoulder: Secondary | ICD-10-CM

## 2018-03-08 DIAGNOSIS — I10 Essential (primary) hypertension: Secondary | ICD-10-CM | POA: Insufficient documentation

## 2018-03-08 DIAGNOSIS — M79609 Pain in unspecified limb: Secondary | ICD-10-CM

## 2018-03-08 DIAGNOSIS — M62838 Other muscle spasm: Secondary | ICD-10-CM

## 2018-03-08 DIAGNOSIS — Z905 Acquired absence of kidney: Secondary | ICD-10-CM | POA: Insufficient documentation

## 2018-03-08 DIAGNOSIS — M792 Neuralgia and neuritis, unspecified: Secondary | ICD-10-CM

## 2018-03-08 DIAGNOSIS — I69398 Other sequelae of cerebral infarction: Secondary | ICD-10-CM | POA: Insufficient documentation

## 2018-03-08 MED ORDER — BACLOFEN 20 MG PO TABS: 20.0000 mg | ORAL_TABLET | Freq: Four times a day (QID) | ORAL | 5 refills | Status: DC

## 2018-03-08 MED ORDER — HYDROCODONE-ACETAMINOPHEN 5-325 MG PO TABS: 1.0000 | ORAL_TABLET | Freq: Three times a day (TID) | ORAL | 0 refills | Status: DC | PRN

## 2018-03-08 MED ORDER — AMITRIPTYLINE HCL 25 MG PO TABS: 25.0000 mg | ORAL_TABLET | Freq: Every day | ORAL | 4 refills | Status: DC

## 2018-03-08 NOTE — Progress Notes (Signed)
Subjective:    Patient ID: Patrick Romero, male    DOB: March 17, 1961, 57 y.o.   MRN: [id number]  HPI: Patrick Romero is a 57 year old male who returns for follow up appointment for chronic pain and medication refill. He states his pain is located in his neck radiating into his bilateral shoulders and lower back. He rates his pain 9, also reports his hydrocodone only lasting for 4 hours. We will change his sig, he verbalizes understanding. His current exercise regimen is walking and performing stretching exercises.   Patrick Romero Morphine Equivalent is 20.00 MME.   Pain Inventory Average Pain 9 Pain Right Now 9 My pain is sharp, burning, tingling and aching  In the last 24 hours, has pain interfered with the following? General activity 7 Relation with others 7 Enjoyment of life 7 What TIME of day is your pain at its worst? all Sleep (in general) Fair  Pain is worse with: walking, bending, sitting and standing Pain improves with: heat/ice, therapy/exercise and medication Relief from Meds: 3  Mobility walk with assistance use a cane do you drive?  no  Function disabled: date disabled .  Neuro/Psych weakness numbness spasms  Prior Studies Any changes since last visit?  no  Physicians involved in your care Any changes since last visit?  no   Family History  Problem Relation Age of Onset  . Hypertension Mother   . Lung cancer Father   . COPD Sister    Social History   Socioeconomic History  . Marital status: Single    Spouse name: Not on file  . Number of children: Not on file  . Years of education: Not on file  . Highest education level: Not on file  Occupational History  . Not on file  Social Needs  . Financial resource strain: Not on file  . Food insecurity:    Worry: Not on file    Inability: Not on file  . Transportation needs:    Medical: Not on file    Non-medical: Not on file  Tobacco Use  . Smoking status: Current Every Day Smoker   Packs/day: 0.25    Types: Cigarettes  . Smokeless tobacco: Never Used  . Tobacco comment: pt is also using nicotine patch  Substance and Sexual Activity  . Alcohol use: Yes    Comment: occ beer   . Drug use: No  . Sexual activity: Yes  Lifestyle  . Physical activity:    Days per week: Not on file    Minutes per session: Not on file  . Stress: Not on file  Relationships  . Social connections:    Talks on phone: Not on file    Gets together: Not on file    Attends religious service: Not on file    Active member of club or organization: Not on file    Attends meetings of clubs or organizations: Not on file    Relationship status: Not on file  Other Topics Concern  . Not on file  Social History Narrative  . Not on file   Past Surgical History:  Procedure Laterality Date  . ROBOT ASSISTED LAPAROSCOPIC RADICAL PROSTATECTOMY N/A 09/13/2017   Procedure: XI ROBOTIC ASSISTED LAPAROSCOPIC RADICAL PROSTATECTOMY, BILATERAL PELVIC LYMPH NODE DISSECTION;  Surgeon: [name], MD;  Location: [facility];  Service: Urology;  Laterality: N/A;  . ROBOTIC ASSITED PARTIAL NEPHRECTOMY Right 01/04/2017   Procedure: XI ROBOTIC ASSITED PARTIAL NEPHRECTOMY;  Surgeon: [name], MD;  Location:  [facility];  Service: Urology;  Laterality: Right;   Past Medical History:  Diagnosis Date  . Arthritis    ra  . Cancer [facility])    prostate   . Cancer of kidney ([place]) 2018   part of right kidney removed  . Chronic back pain    lower back burns some too  . Chronic neck pain   . Depression   . Herniated disc, cervical   . Hypertension   . Sickle cell trait ([place])   . Stroke [facility])    weakness in right hand and leg , numbness on left    BP 103/74   Pulse (!) 101   Ht 6' (1.829 m)   Wt 147 lb (66.7 kg)   SpO2 97%   BMI 19.94 kg/m   Opioid Risk Score:   Fall Risk Score:  `1  Depression screen PHQ 2/9  Depression screen [facility] 2/9 02/05/2018 01/26/2018 10/31/2017 08/22/2017 06/27/2017 05/11/2017 02/17/2017    Decreased Interest 1 2 0 0 1 1 2   Down, Depressed, Hopeless 1 2 0 0 1 1 2   PHQ - 2 Score 2 4 0 0 2 2 4   Altered sleeping - 2 0 1 - 2 2  Tired, decreased energy - 1 0 1 - 2 2  Change in appetite - (No Data) 0 0 - 1 0  Feeling bad or failure about yourself  - 0 0 0 - 1 1  Trouble concentrating - 2 0 0 - 1 2  Moving slowly or fidgety/restless - 2 0 0 - 1 0  Suicidal thoughts - 0 0 0 - 0 0  PHQ-9 Score - 11 0 2 - 10 11  Difficult doing work/chores - - - - - - -    Review of Systems  Constitutional: Negative.   Eyes: Negative.   Respiratory: Negative.   Cardiovascular: Negative.   Gastrointestinal: Negative.   Endocrine: Negative.   Genitourinary: Negative.   Musculoskeletal: Positive for arthralgias, back pain and neck pain.       Spasms   Skin: Positive for rash.  Allergic/Immunologic: Negative.   Neurological: Positive for weakness and numbness.  Hematological: Negative.   Psychiatric/Behavioral: Negative.   All other systems reviewed and are negative.      Objective:   Physical Exam  Constitutional: He is oriented to person, place, and time. He appears well-developed and well-nourished.  HENT:  Head: Normocephalic and atraumatic.  Neck: Normal range of motion. Neck supple.  Cervical Paraspinal Tenderness: C-5-C-6  Cardiovascular: Normal rate and regular rhythm.  Pulmonary/Chest: Effort normal and breath sounds normal.  Musculoskeletal:  Normal Muscle Bulk and Muscle Testing Reveals: Upper Extremities: Right: Decreased ROM 45 Degrees and Muscle Strength 3/5 and Left:  Full ROM and Muscle Strength 4/5 Lumbar Hypersensitivity Lower Extremities: Right: Decreased ROM and Muscle Strength 3/5 Right Lower Extremity Flexion Produces Pain into Lower Extremity Left Lower Extremity: Full ROM and Muscle Strength 5/5 Left Lower Extremity Flexion Produces Pain into left lower Extremity Arises from Table Slowly using cane for support Antalgic Gait  Neurological: He is alert and  oriented to person, place, and time.  Skin: Skin is warm and dry.  Psychiatric: He has a normal mood and affect. His behavior is normal.  Nursing note and vitals reviewed.         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  2. Paresthesia and Pain of Right Extremity: Continue Gabapentin. Continue to Monitor.  3. Adhesive Capsulitis of Right Shoulder: Continue HEP as Tolerated. Continue to Monitor.  4. Chronic Pain Syndrome:  Refilled Hydrocodone 5/325 mg one tablet every 8 hours as needed #90. We will continue the opioid monitoring program, this consists of regular clinic visits, examinations, urine drug screen, pill counts as well as use of [place] Controlled Substance Reporting system. 5. Muscle Spasticity: Continue Tizanidine and Baclorfen.   20 minutes of face to face patient care time was spent during this visit. All questions were encouraged and answered.  F/U in 1 month

## 2018-03-14 ENCOUNTER — Telehealth: Payer: Self-pay | Admitting: *Deleted

## 2018-03-14 DIAGNOSIS — M7501 Adhesive capsulitis of right shoulder: Secondary | ICD-10-CM

## 2018-03-14 DIAGNOSIS — M792 Neuralgia and neuritis, unspecified: Secondary | ICD-10-CM

## 2018-03-14 DIAGNOSIS — G894 Chronic pain syndrome: Secondary | ICD-10-CM

## 2018-03-14 DIAGNOSIS — S14129S Central cord syndrome at unspecified level of cervical spinal cord, sequela: Secondary | ICD-10-CM

## 2018-03-14 DIAGNOSIS — R202 Paresthesia of skin: Secondary | ICD-10-CM

## 2018-03-14 DIAGNOSIS — Z5181 Encounter for therapeutic drug level monitoring: Secondary | ICD-10-CM

## 2018-03-14 DIAGNOSIS — M79609 Pain in unspecified limb: Secondary | ICD-10-CM

## 2018-03-14 DIAGNOSIS — Z79891 Long term (current) use of opiate analgesic: Secondary | ICD-10-CM

## 2018-03-14 NOTE — Telephone Encounter (Signed)
Mr Patrick Romero called for a refill on his hydrocodone.  He is on a medicaid lock and Patrick Romero cannot order his meds, so her prescription was not filled.  His last fill on his rx was 02/05/18.  Please send refill to pharmacy.

## 2018-03-15 MED ORDER — HYDROCODONE-ACETAMINOPHEN 5-325 MG PO TABS: 1.0000 | ORAL_TABLET | Freq: Three times a day (TID) | ORAL | 0 refills | Status: DC | PRN

## 2018-03-15 NOTE — Telephone Encounter (Signed)
done

## 2018-04-05 ENCOUNTER — Encounter: Payer: Self-pay | Admitting: Registered Nurse

## 2018-04-05 ENCOUNTER — Encounter: Payer: Medicaid Other | Attending: Registered Nurse | Admitting: Registered Nurse

## 2018-04-05 ENCOUNTER — Other Ambulatory Visit: Payer: Self-pay

## 2018-04-05 VITALS — BP 121/86 | HR 91 | Ht 72.0 in | Wt 153.0 lb

## 2018-04-05 DIAGNOSIS — S14129A Central cord syndrome at unspecified level of cervical spinal cord, initial encounter: Secondary | ICD-10-CM | POA: Diagnosis not present

## 2018-04-05 DIAGNOSIS — G8250 Quadriplegia, unspecified: Secondary | ICD-10-CM | POA: Insufficient documentation

## 2018-04-05 DIAGNOSIS — M7591 Shoulder lesion, unspecified, right shoulder: Secondary | ICD-10-CM | POA: Insufficient documentation

## 2018-04-05 DIAGNOSIS — R200 Anesthesia of skin: Secondary | ICD-10-CM | POA: Diagnosis not present

## 2018-04-05 DIAGNOSIS — Z801 Family history of malignant neoplasm of trachea, bronchus and lung: Secondary | ICD-10-CM | POA: Insufficient documentation

## 2018-04-05 DIAGNOSIS — D649 Anemia, unspecified: Secondary | ICD-10-CM | POA: Diagnosis not present

## 2018-04-05 DIAGNOSIS — Z9079 Acquired absence of other genital organ(s): Secondary | ICD-10-CM | POA: Insufficient documentation

## 2018-04-05 DIAGNOSIS — Z825 Family history of asthma and other chronic lower respiratory diseases: Secondary | ICD-10-CM | POA: Insufficient documentation

## 2018-04-05 DIAGNOSIS — M7061 Trochanteric bursitis, right hip: Secondary | ICD-10-CM

## 2018-04-05 DIAGNOSIS — Z8249 Family history of ischemic heart disease and other diseases of the circulatory system: Secondary | ICD-10-CM | POA: Diagnosis not present

## 2018-04-05 DIAGNOSIS — Z905 Acquired absence of kidney: Secondary | ICD-10-CM | POA: Diagnosis not present

## 2018-04-05 DIAGNOSIS — I69398 Other sequelae of cerebral infarction: Secondary | ICD-10-CM | POA: Diagnosis not present

## 2018-04-05 DIAGNOSIS — Z5181 Encounter for therapeutic drug level monitoring: Secondary | ICD-10-CM

## 2018-04-05 DIAGNOSIS — G894 Chronic pain syndrome: Secondary | ICD-10-CM | POA: Diagnosis not present

## 2018-04-05 DIAGNOSIS — F17210 Nicotine dependence, cigarettes, uncomplicated: Secondary | ICD-10-CM | POA: Diagnosis not present

## 2018-04-05 DIAGNOSIS — Z8546 Personal history of malignant neoplasm of prostate: Secondary | ICD-10-CM | POA: Insufficient documentation

## 2018-04-05 DIAGNOSIS — M7501 Adhesive capsulitis of right shoulder: Secondary | ICD-10-CM

## 2018-04-05 DIAGNOSIS — M79609 Pain in unspecified limb: Secondary | ICD-10-CM

## 2018-04-05 DIAGNOSIS — Z79891 Long term (current) use of opiate analgesic: Secondary | ICD-10-CM

## 2018-04-05 DIAGNOSIS — R202 Paresthesia of skin: Secondary | ICD-10-CM

## 2018-04-05 DIAGNOSIS — F1010 Alcohol abuse, uncomplicated: Secondary | ICD-10-CM | POA: Insufficient documentation

## 2018-04-05 DIAGNOSIS — I10 Essential (primary) hypertension: Secondary | ICD-10-CM | POA: Insufficient documentation

## 2018-04-05 DIAGNOSIS — Z85528 Personal history of other malignant neoplasm of kidney: Secondary | ICD-10-CM | POA: Insufficient documentation

## 2018-04-05 DIAGNOSIS — D573 Sickle-cell trait: Secondary | ICD-10-CM | POA: Diagnosis not present

## 2018-04-05 DIAGNOSIS — M792 Neuralgia and neuritis, unspecified: Secondary | ICD-10-CM

## 2018-04-05 DIAGNOSIS — S14129S Central cord syndrome at unspecified level of cervical spinal cord, sequela: Secondary | ICD-10-CM

## 2018-04-05 MED ORDER — HYDROCODONE-ACETAMINOPHEN 5-325 MG PO TABS: 1.0000 | ORAL_TABLET | Freq: Three times a day (TID) | ORAL | 0 refills | Status: DC | PRN

## 2018-04-05 NOTE — Progress Notes (Signed)
Subjective:    Patient ID: Patrick Romero, male    DOB: Dec 06, 1960, 56 y.o.   MRN: [id number]  HPI: Patrick Romero is a 57 year old male who returns for follow up appointment for chronic pain and medication refill. He states his ain is located in his neck radiating into his bilateral shoulders, lower back and right hip. He rates his pain 8. His current exercise regime is walking and performing stretching exercises.   [name] Morphine Equivalent is 15.00 MME. UDS ordered today.   Pain Inventory Average Pain 8 Pain Right Now 8 My pain is sharp, tingling and aching  In the last 24 hours, has pain interfered with the following? General activity 8 Relation with others 5 Enjoyment of life 7 What TIME of day is your pain at its worst? varies Sleep (in general) Fair  Pain is worse with: walking, bending, standing and some activites Pain improves with: rest, heat/ice and medication Relief from Meds: 2  Mobility use a cane ability to climb steps?  yes do you drive?  no  Function I need assistance with the following:  shopping  Neuro/Psych weakness numbness trouble walking spasms dizziness  Prior Studies Any changes since last visit?  no  Physicians involved in your care Any changes since last visit?  no   Family History  Problem Relation Age of Onset  . Hypertension Mother   . Lung cancer Father   . COPD Sister    Social History   Socioeconomic History  . Marital status: Single    Spouse name: Not on file  . Number of children: Not on file  . Years of education: Not on file  . Highest education level: Not on file  Occupational History  . Not on file  Social Needs  . Financial resource strain: Not on file  . Food insecurity:    Worry: Not on file    Inability: Not on file  . Transportation needs:    Medical: Not on file    Non-medical: Not on file  Tobacco Use  . Smoking status: Current Every Day Smoker    Packs/day: 0.25    Types: Cigarettes    . Smokeless tobacco: Never Used  . Tobacco comment: pt is also using nicotine patch  Substance and Sexual Activity  . Alcohol use: Yes    Comment: occ beer   . Drug use: No  . Sexual activity: Yes  Lifestyle  . Physical activity:    Days per week: Not on file    Minutes per session: Not on file  . Stress: Not on file  Relationships  . Social connections:    Talks on phone: Not on file    Gets together: Not on file    Attends religious service: Not on file    Active member of club or organization: Not on file    Attends meetings of clubs or organizations: Not on file    Relationship status: Not on file  Other Topics Concern  . Not on file  Social History Narrative  . Not on file   Past Surgical History:  Procedure Laterality Date  . ROBOT ASSISTED LAPAROSCOPIC RADICAL PROSTATECTOMY N/A 09/13/2017   Procedure: XI ROBOTIC ASSISTED LAPAROSCOPIC RADICAL PROSTATECTOMY, BILATERAL PELVIC LYMPH NODE DISSECTION;  Surgeon: [name], MD;  [facility];  Service: Urology;  Laterality: N/A;  . ROBOTIC ASSITED PARTIAL NEPHRECTOMY Right 01/04/2017   Procedure: XI ROBOTIC ASSITED PARTIAL NEPHRECTOMY;  Surgeon: [name], MD;  [facility];  Service: Urology;  Laterality: Right;   Past Medical History:  Diagnosis Date  . Arthritis    ra  . Cancer [facility])    prostate   . Cancer of kidney ([place]) 2018   part of right kidney removed  . Chronic back pain    lower back burns some too  . Chronic neck pain   . Depression   . Herniated disc, cervical   . Hypertension   . Sickle cell trait ([place])   . Stroke [facility])    weakness in right hand and leg , numbness on left    BP 121/86   Pulse 91   Ht 6' (1.829 m)   Wt 153 lb (69.4 kg)   SpO2 97%   BMI 20.75 kg/m   Opioid Risk Score:   Fall Risk Score:  `1  Depression screen PHQ 2/9  Depression screen [facility] 2/9 04/05/2018 02/05/2018 01/26/2018 10/31/2017 08/22/2017 06/27/2017 05/11/2017  Decreased Interest 0 1 2 0 0 1 1  Down,  Depressed, Hopeless 0 1 2 0 0 1 1  PHQ - 2 Score 0 2 4 0 0 2 2  Altered sleeping - - 2 0 1 - 2  Tired, decreased energy - - 1 0 1 - 2  Change in appetite - - (No Data) 0 0 - 1  Feeling bad or failure about yourself  - - 0 0 0 - 1  Trouble concentrating - - 2 0 0 - 1  Moving slowly or fidgety/restless - - 2 0 0 - 1  Suicidal thoughts - - 0 0 0 - 0  PHQ-9 Score - - 11 0 2 - 10  Difficult doing work/chores - - - - - - -    Review of Systems  Constitutional: Negative.   HENT: Negative.   Eyes: Negative.   Respiratory: Negative.   Cardiovascular: Negative.   Gastrointestinal: Negative.   Endocrine: Negative.   Genitourinary: Negative.   Musculoskeletal: Negative.   Skin: Positive for rash.  Allergic/Immunologic: Negative.   Neurological: Negative.   Hematological: Negative.   Psychiatric/Behavioral: Negative.   All other systems reviewed and are negative.      Objective:   Physical Exam  Constitutional: He is oriented to person, place, and time. He appears well-developed and well-nourished.  HENT:  Head: Normocephalic and atraumatic.  Neck: Normal range of motion. Neck supple.  Cardiovascular: Normal rate and regular rhythm.  Pulmonary/Chest: Effort normal and breath sounds normal.  Musculoskeletal:  Normal Muscle Bulk and Muscle Testing Reveals: Upper Extremities: Right: Decreased ROM 45 Degrees and Muscle Strength 3/5 Wearing Brace Left: Full ROM and Muscle Strength 5/5 Bilateral AC Joint Tenderness Thoracic and Lumbar Hypersensitivity Lower Extremities: Right: Decreased ROM and Muscle Strength 4/5 Right Lower Extremity Flexion Produces Pain into Lumbar and Lower Extremity Left: Full ROM and Muscle Strength 5/5 Arises from Table Slowly using cane for support Antalgic gait   Neurological: He is alert and oriented to person, place, and time.  Skin: Skin is warm and dry.  Psychiatric: He has a normal mood and affect. His behavior is normal.  Nursing note and vitals  reviewed.         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 04/05/18. 2. Paresthesia and Pain of Right Extremity: Continue Gabapentin. Continue to Monitor. 04/05/2018. 3. Adhesive Capsulitis of Right Shoulder: Continue HEP as Tolerated. Continue to Monitor. 04/05/2018 4. Chronic Pain Syndrome:  Refilled Hydrocodone  5/325 mg one tablet every 8 hours as needed #90. 04/05/2018. We will continue the opioid monitoring program, this consists of regular clinic visits, examinations, urine drug screen, pill counts as well as use of [place] Controlled Substance Reporting system. 5. Muscle Spasticity: Continue Tizanidine and Baclorfen. 04/05/2018. 6. Right Greater Trochanteric Bursitis: Alternate Ice and Heat Therapy. Continue to Monitor. 04/05/2018.  20 minutes of face to face patient care time was spent during this visit. All questions were encouraged and answered.  F/U in 1 month

## 2018-04-16 ENCOUNTER — Telehealth: Payer: Self-pay | Admitting: *Deleted

## 2018-04-16 ENCOUNTER — Other Ambulatory Visit: Payer: Self-pay

## 2018-04-16 DIAGNOSIS — I10 Essential (primary) hypertension: Secondary | ICD-10-CM

## 2018-04-16 LAB — TOXASSURE SELECT,+ANTIDEPR,UR

## 2018-04-16 MED ORDER — AMLODIPINE BESYLATE 5 MG PO TABS: ORAL_TABLET | ORAL | 2 refills | Status: DC

## 2018-04-16 NOTE — Telephone Encounter (Signed)
He's now non-narcotic

## 2018-04-16 NOTE — Telephone Encounter (Signed)
I have asked April to change his appt to Naaman Plummer since all appts have been with Zella Ball and he is no longer on narcotics.

## 2018-04-16 NOTE — Telephone Encounter (Signed)
Left message for Patrick Romero to  Call me back. I left him a voicemail letting him know we needed to move his appt up to 10:00 (arrive 9:45) with Dr Naaman Plummer instead of [place].I requested a call back.

## 2018-04-16 NOTE — Telephone Encounter (Signed)
Urine drug screen is positive for alcohol.  This is after receiving formal warning letter and third time he has tested positive for alcohol.

## 2018-04-18 NOTE — Telephone Encounter (Signed)
Patrick Romero has not returned calls.  A letter will be mailed today notifying him of non narcotic status.  Do we need to give him a wean down schedule with his recent filled Rx 04/14/18 #90?

## 2018-04-19 ENCOUNTER — Encounter: Payer: Self-pay | Admitting: Internal Medicine

## 2018-04-19 NOTE — Progress Notes (Signed)
I received the results of colonoscopy report from Dr. Penelope Coop of [facility].  It was done 03/06/2018.  The entire colon was normal.  Initially he told the patient that he will need a repeat in 10 years but subsequently had received a letter stating that patient will need repeat in 5 years given family history of colon cancer in his father.

## 2018-04-20 NOTE — Telephone Encounter (Signed)
Patient returned call, asking for a call back.

## 2018-04-20 NOTE — Telephone Encounter (Signed)
Nothings going to change my opinion regarding this. He can have all the fits he wants. Glad I don't scare him, because that wasn't my intention.

## 2018-04-20 NOTE — Telephone Encounter (Addendum)
I informed Mr Swiderski of his non narcotic status.  He was very agitated in his response and said "whats the problem-it ain't like Im doing crack Its not like I am drinking and taking the medication" (but his test was positive the same day that he said he took his dose of pain medication) I said he could discuss further with Dr Naaman Plummer at his 05/07/18 appointment and he said he will discuss it because "I think its bullsh** and he ain't nothing but a doctor and he doesn't scare me"

## 2018-04-25 IMAGING — CT CT HEAD W/O CM
5 of 8 series · 17 of 47 positions shown, 18 images · non-contrast
Comparison: Cervical spine radiographs performed 03/20/2014

CLINICAL DATA: Possible seizure activity, with loss of
consciousness. Concern for head or cervical spine injury. Initial
encounter.

EXAM:
CT HEAD WITHOUT CONTRAST
CT CERVICAL SPINE WITHOUT CONTRAST
TECHNIQUE: Multidetector CT imaging of the head and cervical spine was
performed following the standard protocol without intravenous
contrast. Multiplanar CT image reconstructions of the cervical spine
were also generated.

[Series 3: head without · axial · non-contrast · 0.43mm/px · z∈[+624,+674]mm · 2 of 32 slices shown, 3 images]
[im 11/32  brain]
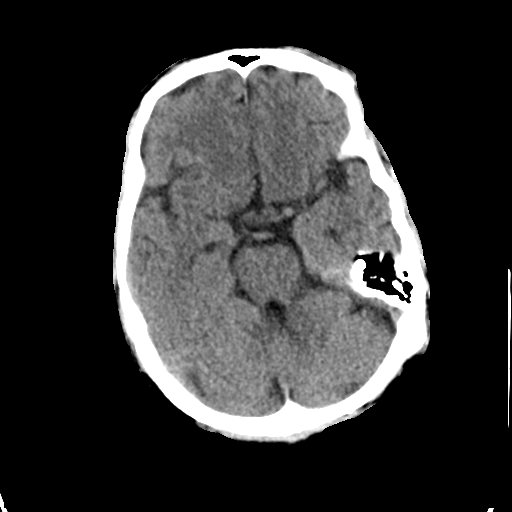
[im 11/32  bone]
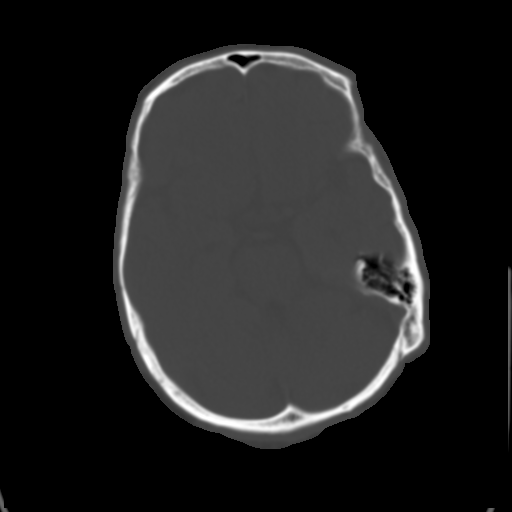
[im 21/32  brain]
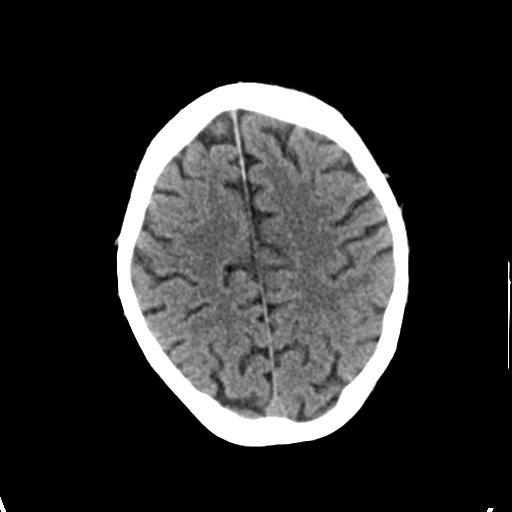

[Series 4: head bone · axial · 0.43mm/px · z∈[+600,+704]mm · 5 of 79 slices shown]
[im 14/79  bone]
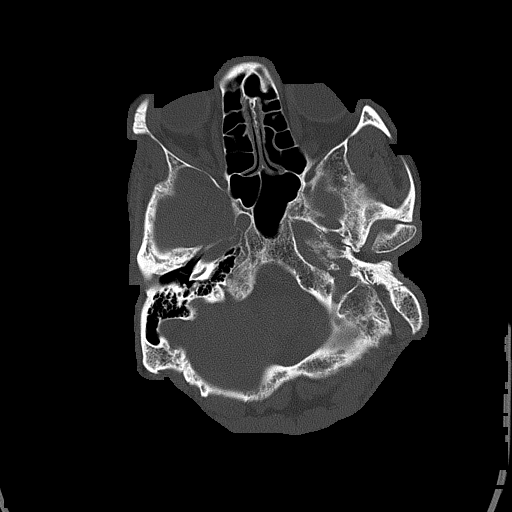
[im 27/79  bone]
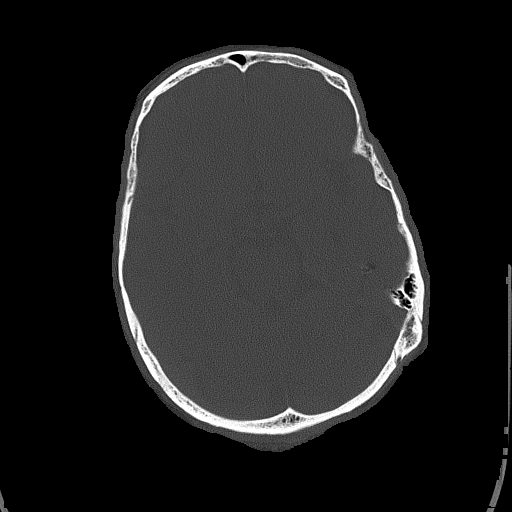
[im 40/79  bone]
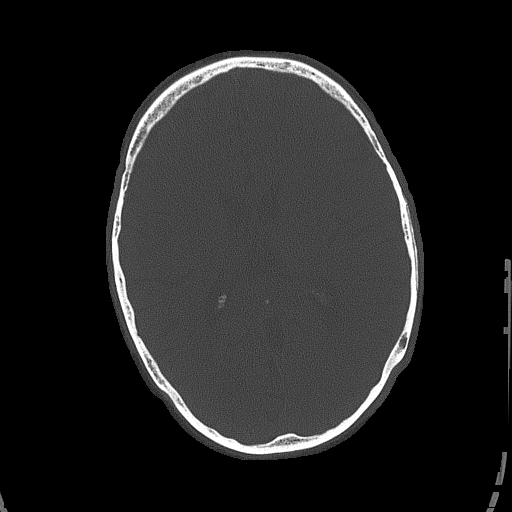
[im 53/79  bone]
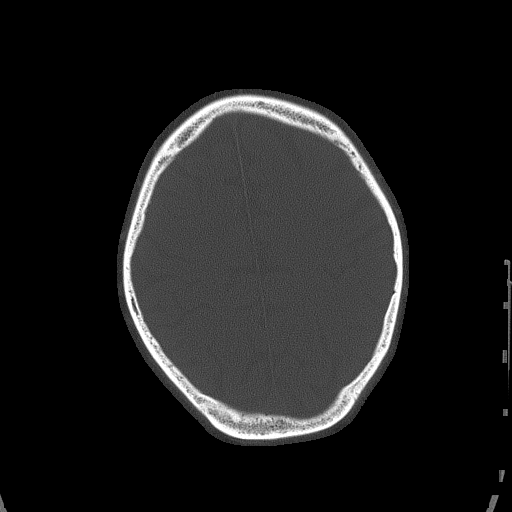
[im 66/79  bone]
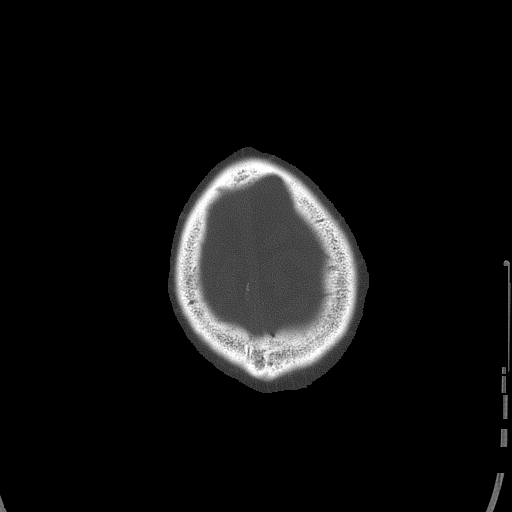

[Series 5: head without cor · coronal · non-contrast · 0.31mm/px · 3 of 67 slices shown]
[im 11/67  brain]
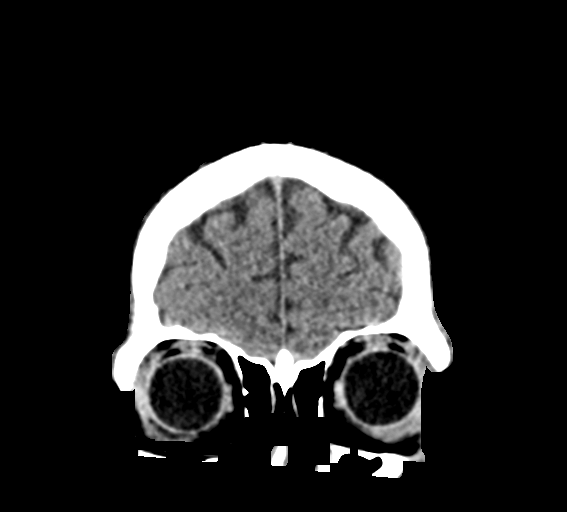
[im 22/67  brain]
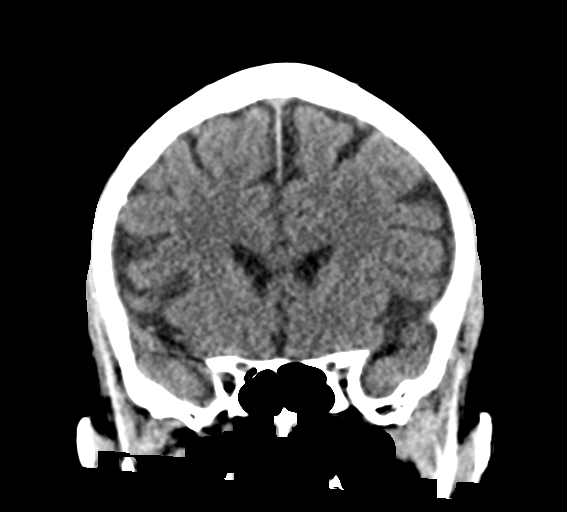
[im 32/67  brain]
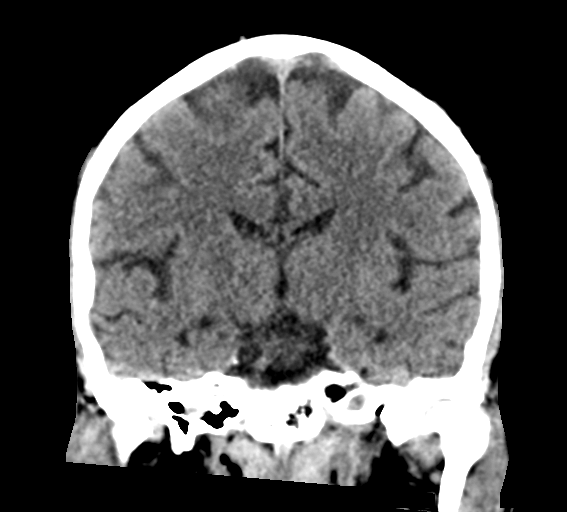

[Series 6: head without sag · sagittal · non-contrast · 0.31mm/px · 1 of 52 slices shown]
[im 26/52  brain]
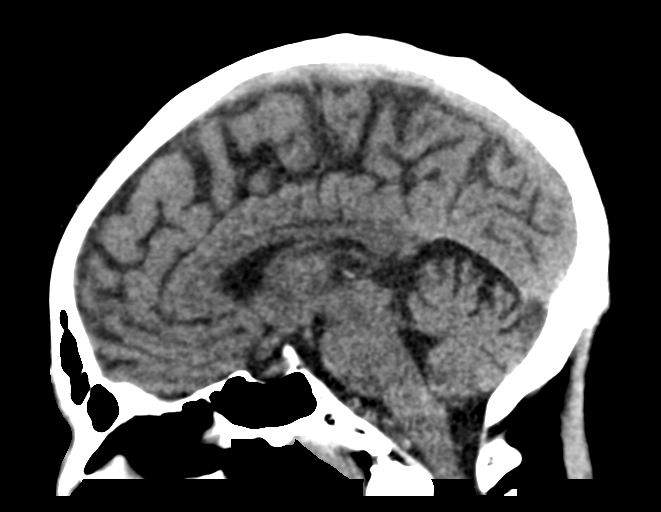

[Series 7: c_spine 2.0 st · axial · 0.32mm/px · z∈[+440,+560]mm · 6 of 109 slices shown]
[im 13/109  brain]
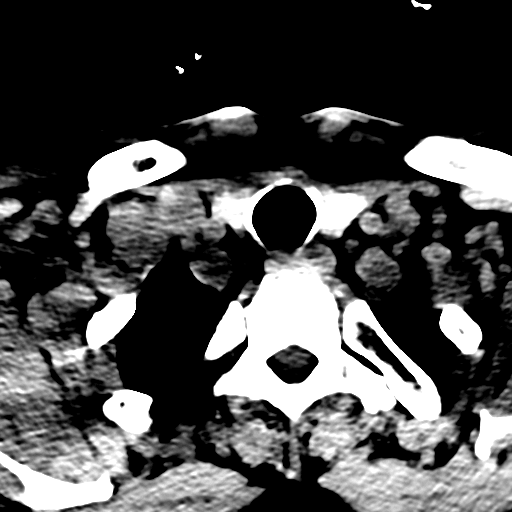
[im 25/109  brain]
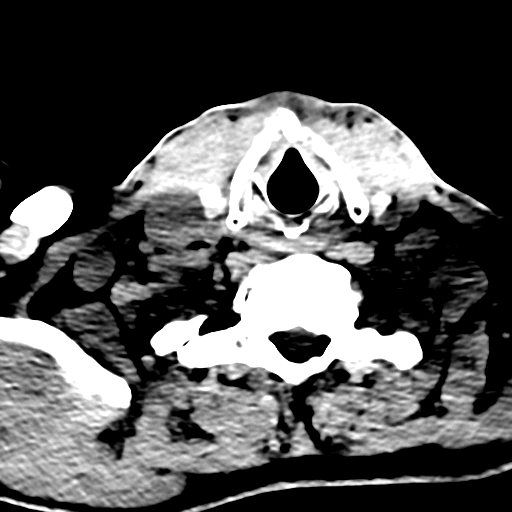
[im 37/109  brain]
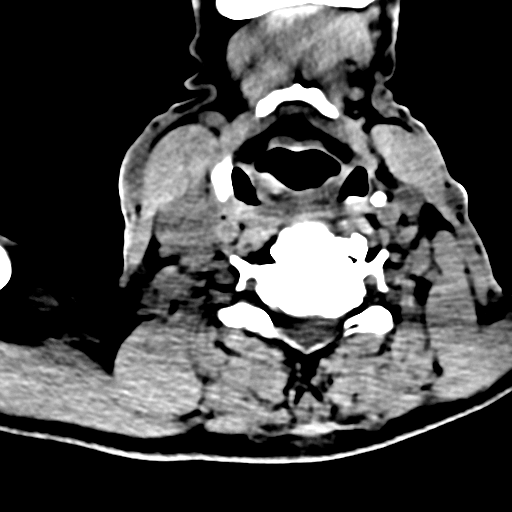
[im 49/109  brain]
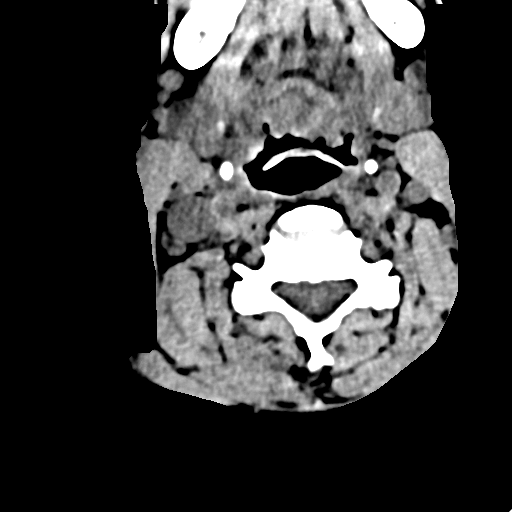
[im 61/109  brain]
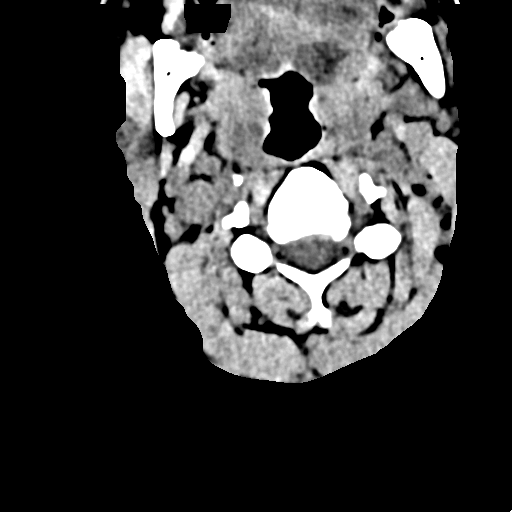
[im 73/109  brain]
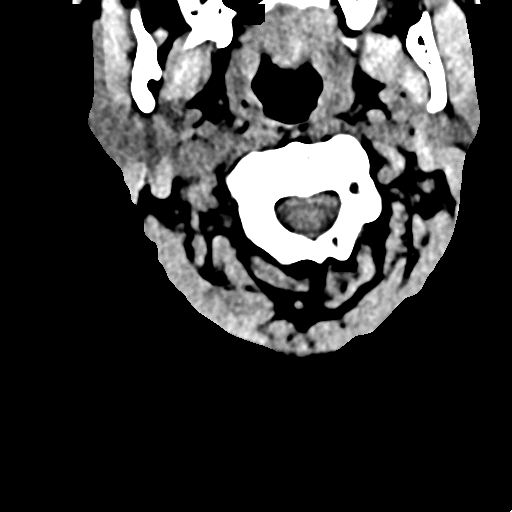

[17 of 47 positions shown; findings below may reference images not displayed]

FINDINGS: CT HEAD FINDINGS

Brain: No evidence of acute infarction, hemorrhage, hydrocephalus,
extra-axial collection or mass lesion/mass effect.

Prominence of the sulci suggests mild cortical volume loss. Mild
cerebellar atrophy is noted.

The brainstem and fourth ventricle are within normal limits. The
basal ganglia are unremarkable in appearance. The cerebral
hemispheres demonstrate grossly normal gray-white differentiation.
No mass effect or midline shift is seen.

Vascular: No hyperdense vessel or unexpected calcification.

Skull: There is no evidence of fracture; visualized osseous
structures are unremarkable in appearance.

Sinuses/Orbits: The orbits are within normal limits. The paranasal
sinuses and mastoid air cells are well-aerated.

Other: No significant soft tissue abnormalities are seen.

CT CERVICAL SPINE FINDINGS

Alignment: Normal. Mild reversal of the normal lordotic curvature of
the cervical spine appears to be degenerative in nature.

Skull base and vertebrae: No acute fracture. No primary bone lesion
or focal pathologic process.

Soft tissues and spinal canal: No prevertebral fluid or swelling. No
visible canal hematoma.

Disc levels: Minimal disc space narrowing is noted at C6-C7, with
scattered anterior and posterior disc osteophyte complexes along the
cervical spine.

Upper chest: A small hypodensity at the right thyroid lobe is likely
benign in nature, given its size. The visualized lung apices are
clear.

Other: No additional soft tissue abnormalities are seen.
IMPRESSION: 1. No evidence of traumatic intracranial injury or fracture.
2. No evidence of fracture or subluxation along the cervical spine.
3. Mild cortical volume loss noted.
4. Minimal degenerative change along the lower cervical spine.

## 2018-05-07 ENCOUNTER — Ambulatory Visit: Payer: Medicaid Other | Admitting: Registered Nurse

## 2018-05-07 ENCOUNTER — Encounter: Payer: Medicaid Other | Admitting: Physical Medicine & Rehabilitation

## 2018-05-29 ENCOUNTER — Encounter: Payer: Self-pay | Admitting: Internal Medicine

## 2018-05-29 ENCOUNTER — Ambulatory Visit: Payer: Medicaid Other | Attending: Internal Medicine | Admitting: Internal Medicine

## 2018-05-29 VITALS — BP 110/78 | HR 88 | Temp 98.4°F | Resp 16 | Wt 156.8 lb

## 2018-05-29 DIAGNOSIS — F17210 Nicotine dependence, cigarettes, uncomplicated: Secondary | ICD-10-CM | POA: Diagnosis not present

## 2018-05-29 DIAGNOSIS — D649 Anemia, unspecified: Secondary | ICD-10-CM

## 2018-05-29 DIAGNOSIS — Z85528 Personal history of other malignant neoplasm of kidney: Secondary | ICD-10-CM | POA: Diagnosis not present

## 2018-05-29 DIAGNOSIS — F17200 Nicotine dependence, unspecified, uncomplicated: Secondary | ICD-10-CM

## 2018-05-29 DIAGNOSIS — Z79899 Other long term (current) drug therapy: Secondary | ICD-10-CM | POA: Diagnosis not present

## 2018-05-29 DIAGNOSIS — Z8249 Family history of ischemic heart disease and other diseases of the circulatory system: Secondary | ICD-10-CM | POA: Diagnosis not present

## 2018-05-29 DIAGNOSIS — F32A Depression, unspecified: Secondary | ICD-10-CM

## 2018-05-29 DIAGNOSIS — R296 Repeated falls: Secondary | ICD-10-CM

## 2018-05-29 DIAGNOSIS — Z905 Acquired absence of kidney: Secondary | ICD-10-CM | POA: Insufficient documentation

## 2018-05-29 DIAGNOSIS — Z23 Encounter for immunization: Secondary | ICD-10-CM | POA: Diagnosis not present

## 2018-05-29 DIAGNOSIS — S14129D Central cord syndrome at unspecified level of cervical spinal cord, subsequent encounter: Secondary | ICD-10-CM

## 2018-05-29 DIAGNOSIS — I10 Essential (primary) hypertension: Secondary | ICD-10-CM

## 2018-05-29 DIAGNOSIS — Z801 Family history of malignant neoplasm of trachea, bronchus and lung: Secondary | ICD-10-CM | POA: Diagnosis not present

## 2018-05-29 DIAGNOSIS — Z8546 Personal history of malignant neoplasm of prostate: Secondary | ICD-10-CM | POA: Diagnosis not present

## 2018-05-29 DIAGNOSIS — F329 Major depressive disorder, single episode, unspecified: Secondary | ICD-10-CM | POA: Diagnosis not present

## 2018-05-29 DIAGNOSIS — Z9079 Acquired absence of other genital organ(s): Secondary | ICD-10-CM | POA: Diagnosis not present

## 2018-05-29 NOTE — Progress Notes (Signed)
Pt states he has pain coming from his neck, upper back and right leg

## 2018-05-29 NOTE — Patient Instructions (Addendum)
I will submit a referral for home physical therapy for you.  In terms of smoking cessation, I encourage you to set a quit date.  Td Vaccine (Tetanus and Diphtheria): What You Need to Know 1. Why get vaccinated? Tetanus  and diphtheria are very serious diseases. They are rare in the [place] today, but people who do become infected often have severe complications. Td vaccine is used to protect adolescents and adults from both of these diseases. Both tetanus and diphtheria are infections caused by bacteria. Diphtheria spreads from person to person through coughing or sneezing. Tetanus-causing bacteria enter the body through cuts, scratches, or wounds. TETANUS (lockjaw) causes painful muscle tightening and stiffness, usually all over the body.  It can lead to tightening of muscles in the head and neck so you can't open your mouth, swallow, or sometimes even breathe. Tetanus kills about 1 out of every 10 people who are infected even after receiving the best medical care.  DIPHTHERIA can cause a thick coating to form in the back of the throat.  It can lead to breathing problems, paralysis, heart failure, and death.  Before vaccines, as many as 200,000 cases of diphtheria and hundreds of cases of tetanus were reported in the [place] each year. Since vaccination began, reports of cases for both diseases have dropped by about 99%. 2. Td vaccine Td vaccine can protect adolescents and adults from tetanus and diphtheria. Td is usually given as a booster dose every 10 years but it can also be given earlier after a severe and dirty wound or burn. Another vaccine, called Tdap, which protects against pertussis in addition to tetanus and diphtheria, is sometimes recommended instead of Td vaccine. Your doctor or the person giving you the vaccine can give you more information. Td may safely be given at the same time as other vaccines. 3. Some people should not get this vaccine  A person who has ever  had a life-threatening allergic reaction after a previous dose of any tetanus or diphtheria containing vaccine, OR has a severe allergy to any part of this vaccine, should not get Td vaccine. Tell the person giving the vaccine about any severe allergies.  Talk to your doctor if you: ? had severe pain or swelling after any vaccine containing diphtheria or tetanus, ? ever had a condition called Guillain Barre Syndrome (GBS), ? aren't feeling well on the day the shot is scheduled. 4. What are the risks from Td vaccine? With any medicine, including vaccines, there is a chance of side effects. These are usually mild and go away on their own. Serious reactions are also possible but are rare. Most people who get Td vaccine do not have any problems with it. Mild problems following Td vaccine: (Did not interfere with activities)  Pain where the shot was given (about 8 people in 10)  Redness or swelling where the shot was given (about 1 person in 4)  Mild fever (rare)  Headache (about 1 person in 4)  Tiredness (about 1 person in 4)  Moderate problems following Td vaccine: (Interfered with activities, but did not require medical attention)  Fever over 102F (rare)  Severe problems following Td vaccine: (Unable to perform usual activities; required medical attention)  Swelling, severe pain, bleeding and/or redness in the arm where the shot was given (rare).  Problems that could happen after any vaccine:  People sometimes faint after a medical procedure, including vaccination. Sitting or lying down for about 15 minutes can help prevent fainting,  and injuries caused by a fall. Tell your doctor if you feel dizzy, or have vision changes or ringing in the ears.  Some people get severe pain in the shoulder and have difficulty moving the arm where a shot was given. This happens very rarely.  Any medication can cause a severe allergic reaction. Such reactions from a vaccine are very rare, estimated  at fewer than 1 in a million doses, and would happen within a few minutes to a few hours after the vaccination. As with any medicine, there is a very remote chance of a vaccine causing a serious injury or death. The safety of vaccines is always being monitored. For more information, visit: http://www.aguilar.org/ 5. What if there is a serious reaction? What should I look for? Look for anything that concerns you, such as signs of a severe allergic reaction, very high fever, or unusual behavior. Signs of a severe allergic reaction can include hives, swelling of the face and throat, difficulty breathing, a fast heartbeat, dizziness, and weakness. These would usually start a few minutes to a few hours after the vaccination. What should I do?  If you think it is a severe allergic reaction or other emergency that can't wait, call 9-1-1 or get the person to the nearest hospital. Otherwise, call your doctor.  Afterward, the reaction should be reported to the Vaccine Adverse Event Reporting System (VAERS). Your doctor might file this report, or you can do it yourself through the VAERS web site at www.vaers.SamedayNews.es, or by calling 775-145-9228. ? VAERS does not give medical advice. 6. The National Vaccine Injury Compensation Program The [employer] Vaccine Injury Compensation Program (VICP) is a federal program that was created to compensate people who may have been injured by certain vaccines. Persons who believe they may have been injured by a vaccine can learn about the program and about filing a claim by calling (484) 457-3712 or visiting the [place] website at GoldCloset.com.ee. There is a time limit to file a claim for compensation. 7. How can I learn more?  Ask your doctor. He or she can give you the vaccine package insert or suggest other sources of information.  Call your local or state health department.  Contact the Centers for Disease Control and Prevention (CDC): ? Call  367-013-1911 (1-800-CDC-INFO) ? Visit CDC's website at http://hunter.com/ CDC Td Vaccine VIS (10/20/15) This information is not intended to replace advice given to you by your health care provider. Make sure you discuss any questions you have with your health care provider. Document Released: 04/24/2006 Document Revised: 03/17/2016 Document Reviewed: 03/17/2016 Elsevier Interactive Patient Education  2017 [place] Prevention in the Home Falls can cause injuries. They can happen to people of all ages. There are many things you can do to make your home safe and to help prevent falls. What can I do on the outside of my home?  Regularly fix the edges of walkways and driveways and fix any cracks.  Remove anything that might make you trip as you walk through a door, such as a raised step or threshold.  Trim any bushes or trees on the path to your home.  Use bright outdoor lighting.  Clear any walking paths of anything that might make someone trip, such as rocks or tools.  Regularly check to see if handrails are loose or broken. Make sure that both sides of any steps have handrails.  Any raised decks and porches should have guardrails on the edges.  Have any leaves, snow, or  ice cleared regularly.  Use sand or salt on walking paths during winter.  Clean up any spills in your garage right away. This includes oil or grease spills. What can I do in the bathroom?  Use night lights.  Install grab bars by the toilet and in the tub and shower. Do not use towel bars as grab bars.  Use non-skid mats or decals in the tub or shower.  If you need to sit down in the shower, use a plastic, non-slip stool.  Keep the floor dry. Clean up any water that spills on the floor as soon as it happens.  Remove soap buildup in the tub or shower regularly.  Attach bath mats securely with double-sided non-slip rug tape.  Do not have throw rugs and other things on the floor that can make you  trip. What can I do in the bedroom?  Use night lights.  Make sure that you have a light by your bed that is easy to reach.  Do not use any sheets or blankets that are too big for your bed. They should not hang down onto the floor.  Have a firm chair that has side arms. You can use this for support while you get dressed.  Do not have throw rugs and other things on the floor that can make you trip. What can I do in the kitchen?  Clean up any spills right away.  Avoid walking on wet floors.  Keep items that you use a lot in easy-to-reach places.  If you need to reach something above you, use a strong step stool that has a grab bar.  Keep electrical cords out of the way.  Do not use floor polish or wax that makes floors slippery. If you must use wax, use non-skid floor wax.  Do not have throw rugs and other things on the floor that can make you trip. What can I do with my stairs?  Do not leave any items on the stairs.  Make sure that there are handrails on both sides of the stairs and use them. Fix handrails that are broken or loose. Make sure that handrails are as long as the stairways.  Check any carpeting to make sure that it is firmly attached to the stairs. Fix any carpet that is loose or worn.  Avoid having throw rugs at the top or bottom of the stairs. If you do have throw rugs, attach them to the floor with carpet tape.  Make sure that you have a light switch at the top of the stairs and the bottom of the stairs. If you do not have them, ask someone to add them for you. What else can I do to help prevent falls?  Wear shoes that: ? Do not have high heels. ? Have rubber bottoms. ? Are comfortable and fit you well. ? Are closed at the toe. Do not wear sandals.  If you use a stepladder: ? Make sure that it is fully opened. Do not climb a closed stepladder. ? Make sure that both sides of the stepladder are locked into place. ? Ask someone to hold it for you, if  possible.  Clearly mark and make sure that you can see: ? Any grab bars or handrails. ? First and last steps. ? Where the edge of each step is.  Use tools that help you move around (mobility aids) if they are needed. These include: ? Canes. ? Walkers. ? Scooters. ? Crutches.  Turn on the  lights when you go into a dark area. Replace any light bulbs as soon as they burn out.  Set up your furniture so you have a clear path. Avoid moving your furniture around.  If any of your floors are uneven, fix them.  If there are any pets around you, be aware of where they are.  Review your medicines with your doctor. Some medicines can make you feel dizzy. This can increase your chance of falling. Ask your doctor what other things that you can do to help prevent falls. This information is not intended to replace advice given to you by your health care provider. Make sure you discuss any questions you have with your health care provider. Document Released: 04/23/2009 Document Revised: 12/03/2015 Document Reviewed: 08/01/2014 Elsevier Interactive Patient Education  [employer].

## 2018-05-29 NOTE — Progress Notes (Signed)
Patient ID: Patrick Romero, male    DOB: [birth year]  MRN: [id number]  CC: Hypertension   Subjective: Patrick Romero is a 57 y.o. male who presents for chronic disease management. 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 Agrees to Tdap vaccine.  HTN:  Compliant with Norvasc.  Checks BP QOD at home.  Gives range 125-135/80-85 Limits salt in foods No CP.  Some LE edema x 4 mths.  Occasional SOB when laying down.  Sleeps on 3 pillows which is not new for him.  He did get set up with [facility].  Has home health aide who comes out 7 days a wk for 2-5 hrs.  Tob dep:  "I don't smoke as much but I still have not quit."  I pk last 3 days.  Not using the the nicotine lozenges consistently.  "Its my goal to quit."   Pos Depression screen:  He admits to feeling down due to his injury that limits his physical ability and has caused chronic pain.  He prefers to stay to himself.  Does not feel up to socializing.  Endorses poor sleep despite being on several sedating meds.  He drinks a beer or two occasionally.  Denies SI.   Started on Hydrocodone by Dr.Swartz in July but I do not see it on his med list.  He reports that he has fallen about 5 times since he last saw me.  All falls occurred at home.  In several instances he tripped over throw rugs.  On one occasion he fell while trying to get out of his tub.  He does have shower bars, nonslip rug in his shower and shower chair.  Last fall occurred about a week or 2 ago where he loss his balance coming down steps to the front of his house.  Reports having had physical therapy in the past.  He walks with a 4 pronged cane.  Anemia: Had colonoscopy through [facility]  in August of this year.  The entire colon was normal.  Patient needs repeat colonoscopy in 5 years given family history of colon cancer in his father.  Iron studies done on last visit revealed normal ferritin and iron levels.  CBC revealed improvement in H&H.  Patient Active Problem List   Diagnosis Date Noted  . Prostate cancer ([place]) 09/13/2017  . Hypertension 02/17/2017  . Renal mass 01/04/2017  . Renal carcinoma, right ([place]) 11/23/2016  . Atherosclerosis of aorta ([place]) 11/23/2016  . 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 Acute blood loss anemia   . Dysesthesia   . History of syncope   . 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 Tetraparesis ([place])   . Central cord syndrome ([place])   . Anemia   . Renal cyst   . ETOH abuse   . Chest wall pain   . Neuropathic pain   . Neck pain   . Chronic neck and back pain 09/01/2016  . Alcohol intoxication ([place]) 09/01/2016  . Tobacco abuse 09/01/2016     Current Outpatient Medications on File Prior to Visit  Medication Sig Dispense Refill  . amitriptyline (ELAVIL) 25 MG tablet Take 1 tablet (25 mg total) by mouth at bedtime. 30 tablet 4  .  amLODipine (NORVASC) 5 MG tablet Take one tablet by mouth once a day. 30 tablet 2  . baclofen (LIORESAL) 20 MG tablet Take 1 tablet (20 mg total) by mouth 4 (four) times daily. 120 tablet 5  . gabapentin (NEURONTIN) 300 MG capsule Take 3 capsules (900 mg total) by mouth 3 (three) times daily. 270 capsule 2  . nicotine (NICODERM CQ - DOSED IN MG/24 HOURS) 21 mg/24hr patch Place 1 patch (21 mg total) onto the skin daily. (Patient taking differently: Place 21 mg onto the skin 2 (two) times a week. ) 28 patch 1  . tiZANidine (ZANAFLEX) 2 MG tablet Take 1 tablet (2 mg total) by mouth 4 (four) times daily. 120 tablet 3   No current facility-administered medications on file prior to visit.     No Known Allergies  Social History   Socioeconomic History  . Marital status: Single    Spouse name:  Not on file  . Number of children: Not on file  . Years of education: Not on file  . Highest education level: Not on file  Occupational History  . Not on file  Social Needs  . Financial resource strain: Not on file  . Food insecurity:    Worry: Not on file    Inability: Not on file  . Transportation needs:    Medical: Not on file    Non-medical: Not on file  Tobacco Use  . Smoking status: Current Every Day Smoker    Packs/day: 0.25    Types: Cigarettes  . Smokeless tobacco: Never Used  . Tobacco comment: pt is also using nicotine patch  Substance and Sexual Activity  . Alcohol use: Yes    Comment: occ beer   . Drug use: No  . Sexual activity: Yes  Lifestyle  . Physical activity:    Days per week: Not on file    Minutes per session: Not on file  . Stress: Not on file  Relationships  . Social connections:    Talks on phone: Not on file    Gets together: Not on file    Attends religious service: Not on file    Active member of club or organization: Not on file    Attends meetings of clubs or organizations: Not on file    Relationship status: Not on file  . Intimate partner violence:    Fear of current or ex partner: Not on file    Emotionally abused: Not on file    Physically abused: Not on file    Forced sexual activity: Not on file  Other Topics Concern  . Not on file  Social History Narrative  . Not on file    Family History  Problem Relation Age of Onset  . Hypertension Mother   . Lung cancer Father   . COPD Sister     Past Surgical History:  Procedure Laterality Date  . ROBOT ASSISTED LAPAROSCOPIC RADICAL PROSTATECTOMY N/A 09/13/2017   Procedure: XI ROBOTIC ASSISTED LAPAROSCOPIC RADICAL PROSTATECTOMY, BILATERAL PELVIC LYMPH NODE DISSECTION;  Surgeon: [name], MD;  Location: [facility];  Service: Urology;  Laterality: N/A;  . ROBOTIC ASSITED PARTIAL NEPHRECTOMY Right 01/04/2017   Procedure: XI ROBOTIC ASSITED PARTIAL NEPHRECTOMY;  Surgeon: [name], MD;  Location: [facility];  Service: Urology;  Laterality: Right;    ROS: Review of Systems Negative except as above. PHYSICAL EXAM: BP 110/78   Pulse 88   Temp 98.4 F (36.9 C) (Oral)   Resp 16   Wt  156 lb 12.8 oz (71.1 kg)   SpO2 95%   BMI 21.27 kg/m   Wt Readings from Last 3 Encounters:  05/29/18 156 lb 12.8 oz (71.1 kg)  04/05/18 153 lb (69.4 kg)  03/08/18 147 lb (66.7 kg)  Repeat blood pressure 110/78  Physical Exam General appearance - alert, well appearing, and in no distress Mental status -patient with flat affect.  He answers questions appropriately. Neck -no cervical lymphadenopathy.  No thyroid enlargement. Chest - clear to auscultation, no wheezes, rales or rhonchi, symmetric air entry Heart - normal rate, regular rhythm, normal S1, S2, no murmurs, rubs, clicks or gallops.  No JVD. Musculoskeletal -patient ambulates with a 4 pronged walker.  Gait is unsteady without the cane.  He wears a brace on the right forearm. Extremities -no lower extremity edema. Depression screen [facility] 2/9 05/29/2018 04/05/2018 02/05/2018  Decreased Interest 2 0 1  Down, Depressed, Hopeless 1 0 1  PHQ - 2 Score 3 0 2  Altered sleeping 3 - -  Tired, decreased energy 2 - -  Change in appetite 1 - -  Feeling bad or failure about yourself  1 - -  Trouble concentrating 2 - -  Moving slowly or fidgety/restless 2 - -  Suicidal thoughts 0 - -  PHQ-9 Score 14 - -  Difficult doing work/chores - - -  Some recent data might be hidden    ASSESSMENT AND PLAN: 1. Essential hypertension At goal.  Continue amlodipine and low-salt diet.  2. Depression, unspecified depression type We discussed increasing amitriptyline or adding another antidepressant.  Patient declines.  He also declines referral for counseling.  3. Falls frequently 4. Central cord syndrome, subsequent encounter [facility]) I think he would benefit from home physical therapy more so to evaluate the home situation and address safety  issues to help reduce fall.  He would also benefit from gait training and some strengthening in the right upper extremity. - Ambulatory referral to [facility]  5. Tobacco dependence Advised to quit.  Encourage him to set a quit date and to use the nicotine lozenges consistently.  6. Anemia, unspecified type   7. Need for Tdap vaccination  Patient was given the opportunity to ask questions.  Patient verbalized understanding of the plan and was able to repeat key elements of the plan.   Orders Placed This Encounter  Procedures  . Tdap vaccine greater than or equal to 7yo IM  . Ambulatory referral to [facility]     Requested Prescriptions    No prescriptions requested or ordered in this encounter    Return in about 3 months (around 08/29/2018).  Karle Plumber, MD, FACP

## 2018-05-30 ENCOUNTER — Telehealth: Payer: Self-pay | Admitting: *Deleted

## 2018-05-30 ENCOUNTER — Ambulatory Visit: Payer: Medicaid Other | Admitting: Physical Medicine & Rehabilitation

## 2018-05-30 ENCOUNTER — Encounter: Payer: Medicaid Other | Admitting: Physical Medicine & Rehabilitation

## 2018-05-30 NOTE — Telephone Encounter (Signed)
No showed for appt today. Discharge from practice per Dr Naaman Plummer.  Letter mailed.

## 2018-05-31 ENCOUNTER — Ambulatory Visit: Payer: Medicaid Other | Admitting: Registered Nurse

## 2018-06-01 ENCOUNTER — Telehealth: Payer: Self-pay

## 2018-06-01 NOTE — Telephone Encounter (Signed)
Referral received for home PT.  Attempted to contact the patient to inquire if he has a preference for home health agencies.  Call placed to # [phone] and message was left requesting a call back to # [phone]

## 2018-06-04 ENCOUNTER — Telehealth: Payer: Self-pay

## 2018-06-04 NOTE — Telephone Encounter (Signed)
Referral received for home PT.  Attempted to contact the patient to inquire if he has a preference for home health agencies.  Call placed to # [phone] and message was left requesting a call back to # [phone]

## 2018-06-11 ENCOUNTER — Telehealth: Payer: Self-pay | Admitting: *Deleted

## 2018-06-11 ENCOUNTER — Telehealth: Payer: Self-pay

## 2018-06-11 NOTE — Telephone Encounter (Signed)
Attempted again to contact the patient to inquire if he has a preference for home health agencies. Call placed to # [phone] message was left requesting a call back to # [phone]

## 2018-06-11 NOTE — Telephone Encounter (Signed)
Patrick Romero called back about the letter he received.  He is asking why he was discharged over missing one appt.  I explained to him that since he was made non narcotic and did not show up for the following appt, it was assumed he was not coming back.  He says that he in fact was over booked that day, inadvertently, because he had to appear in court about child support.  He understands about the medication but needs Dr Naaman Plummer as his rehab doctor.  Asking for reconsideration.

## 2018-06-12 NOTE — Telephone Encounter (Signed)
I will see for non-narcotic mgt. Can not miss further appts though

## 2018-06-12 NOTE — Telephone Encounter (Signed)
Discharge reversed and letter will be voided.

## 2018-06-29 ENCOUNTER — Ambulatory Visit: Payer: Medicaid Other | Admitting: Registered Nurse

## 2018-07-09 ENCOUNTER — Encounter

## 2018-07-09 ENCOUNTER — Ambulatory Visit: Payer: Medicaid Other | Admitting: Physical Medicine & Rehabilitation

## 2018-07-10 ENCOUNTER — Other Ambulatory Visit: Payer: Self-pay | Admitting: Physical Medicine & Rehabilitation

## 2018-07-10 DIAGNOSIS — M79609 Pain in unspecified limb: Secondary | ICD-10-CM

## 2018-07-10 DIAGNOSIS — R202 Paresthesia of skin: Secondary | ICD-10-CM

## 2018-07-10 DIAGNOSIS — S14129S Central cord syndrome at unspecified level of cervical spinal cord, sequela: Secondary | ICD-10-CM

## 2018-07-12 MED ORDER — AMITRIPTYLINE HCL 25 MG PO TABS: 25.0000 mg | ORAL_TABLET | Freq: Every day | ORAL | 1 refills | Status: DC

## 2018-07-12 MED ORDER — BACLOFEN 20 MG PO TABS: 20.0000 mg | ORAL_TABLET | Freq: Four times a day (QID) | ORAL | 1 refills | Status: DC

## 2018-07-23 ENCOUNTER — Encounter: Payer: Medicaid Other | Attending: Physical Medicine & Rehabilitation | Admitting: Physical Medicine & Rehabilitation

## 2018-07-23 ENCOUNTER — Encounter: Payer: Self-pay | Admitting: Physical Medicine & Rehabilitation

## 2018-07-23 VITALS — BP 116/78 | HR 112 | Ht 72.0 in | Wt 163.0 lb

## 2018-07-23 DIAGNOSIS — S14129S Central cord syndrome at unspecified level of cervical spinal cord, sequela: Secondary | ICD-10-CM | POA: Diagnosis present

## 2018-07-23 DIAGNOSIS — I10 Essential (primary) hypertension: Secondary | ICD-10-CM | POA: Diagnosis not present

## 2018-07-23 DIAGNOSIS — M7501 Adhesive capsulitis of right shoulder: Secondary | ICD-10-CM | POA: Diagnosis present

## 2018-07-23 MED ORDER — AMLODIPINE BESYLATE 5 MG PO TABS: ORAL_TABLET | ORAL | 2 refills | Status: DC

## 2018-07-23 NOTE — Patient Instructions (Signed)
PLEASE FEEL FREE TO CALL OUR OFFICE WITH ANY PROBLEMS OR QUESTIONS ([phone])      

## 2018-07-23 NOTE — Progress Notes (Signed)
Subjective:    Patient ID: Patrick Romero, male    DOB: 01-Jan-1961, 58 y.o.   MRN: [id number]  HPI   Mr Doolan is here in follow up of his central cord syndrome. He reports more numbness in the left arm compared to the past. He also is feeling more pain in the left shoulder. He continues with daily exercises including weights and ROM.  He continues to utilize a quad cane for balance.  He has had occasional falls but for the most part has been using better safety awareness.  Bowel bladder function have been consistent.  He last had therapy in October 2018.  He remains on gabapentin, tizanidine, baclofen and amitriptyline as prescribed.  He did tell me he is running out of his Norvasc today and asked me if I could help him refill this until he sees his primary.     Pain Inventory Average Pain 8 Pain Right Now 8 My pain is constant, sharp, burning, stabbing, tingling and aching  In the last 24 hours, has pain interfered with the following? General activity 8 Relation with others 5 Enjoyment of life 7 What TIME of day is your pain at its worst? varies Sleep (in general) Fair  Pain is worse with: walking, bending and standing Pain improves with: heat/ice Relief from Meds: 1  Mobility walk with assistance use a cane ability to climb steps?  yes do you drive?  no  Function I need assistance with the following:  shopping  Neuro/Psych bladder control problems weakness numbness tremor tingling spasms  Prior Studies Any changes since last visit?  no  Physicians involved in your care Any changes since last visit?  no   Family History  Problem Relation Age of Onset  . Hypertension Mother   . Lung cancer Father   . COPD Sister    Social History   Socioeconomic History  . Marital status: Single    Spouse name: Not on file  . Number of children: Not on file  . Years of education: Not on file  . Highest education level: Not on file  Occupational History  . Not  on file  Social Needs  . Financial resource strain: Not on file  . Food insecurity:    Worry: Not on file    Inability: Not on file  . Transportation needs:    Medical: Not on file    Non-medical: Not on file  Tobacco Use  . Smoking status: Current Every Day Smoker    Packs/day: 0.25    Types: Cigarettes  . Smokeless tobacco: Never Used  . Tobacco comment: pt is also using nicotine patch  Substance and Sexual Activity  . Alcohol use: Yes    Comment: occ beer   . Drug use: No  . Sexual activity: Yes  Lifestyle  . Physical activity:    Days per week: Not on file    Minutes per session: Not on file  . Stress: Not on file  Relationships  . Social connections:    Talks on phone: Not on file    Gets together: Not on file    Attends religious service: Not on file    Active member of club or organization: Not on file    Attends meetings of clubs or organizations: Not on file    Relationship status: Not on file  Other Topics Concern  . Not on file  Social History Narrative  . Not on file   Past Surgical History:  Procedure  Laterality Date  . ROBOT ASSISTED LAPAROSCOPIC RADICAL PROSTATECTOMY N/A 09/13/2017   Procedure: XI ROBOTIC ASSISTED LAPAROSCOPIC RADICAL PROSTATECTOMY, BILATERAL PELVIC LYMPH NODE DISSECTION;  Surgeon: [name], MD;  Location: [facility];  Service: Urology;  Laterality: N/A;  . ROBOTIC ASSITED PARTIAL NEPHRECTOMY Right 01/04/2017   Procedure: XI ROBOTIC ASSITED PARTIAL NEPHRECTOMY;  Surgeon: [name], MD;  Location: [facility];  Service: Urology;  Laterality: Right;   Past Medical History:  Diagnosis Date  . Arthritis    ra  . Cancer [facility])    prostate   . Cancer of kidney ([place]) 2018   part of right kidney removed  . Chronic back pain    lower back burns some too  . Chronic neck pain   . Depression   . Herniated disc, cervical   . Hypertension   . Sickle cell trait ([place])   . Stroke [facility]    weakness in right hand and leg , numbness on left     BP 116/78   Pulse (!) 112   Ht 6' (1.829 m)   Wt 163 lb (73.9 kg)   SpO2 98%   BMI 22.11 kg/m   Opioid Risk Score:   Fall Risk Score:  `1  Depression screen PHQ 2/9  Depression screen [facility] 2/9 07/23/2018 05/29/2018 04/05/2018 02/05/2018 01/26/2018 10/31/2017 08/22/2017  Decreased Interest 0 2 0 1 2 0 0  Down, Depressed, Hopeless 0 1 0 1 2 0 0  PHQ - 2 Score 0 3 0 2 4 0 0  Altered sleeping - 3 - - 2 0 1  Tired, decreased energy - 2 - - 1 0 1  Change in appetite - 1 - - (No Data) 0 0  Feeling bad or failure about yourself  - 1 - - 0 0 0  Trouble concentrating - 2 - - 2 0 0  Moving slowly or fidgety/restless - 2 - - 2 0 0  Suicidal thoughts - 0 - - 0 0 0  PHQ-9 Score - 14 - - 11 0 2  Difficult doing work/chores - - - - - - -  Some recent data might be hidden     Review of Systems  Constitutional: Positive for diaphoresis.  HENT: Negative.   Eyes: Negative.   Respiratory: Negative.   Cardiovascular: Negative.   Endocrine: Negative.   Genitourinary: Negative.   Skin: Positive for rash.  Allergic/Immunologic: Negative.   Neurological: Negative.   Hematological: Negative.   All other systems reviewed and are negative.      Objective:   Physical Exam  General: No acute distress HEENT: EOMI, oral membranes moist Cards: reg rate  Chest: normal effort Abdomen: Soft, NT, ND Skin: dry, intact Extremities: no edema  Neuro: Alert and oriented x 3+ left upper extremity is 4 out of 5 proximal to distal.  Right upper extremity is 3- to 3 out of 5 proximal distal with some limitations once again due to his right shoulder.  Is able to passively abduct the shoulder to about 75 to 80 degrees.  Sensation is unchanged from prior visit with decreased light touch in the right greater than left upper and lower extremities.  LLE motor: 4-/5 at the ankle dorsiflexor, plantar flexor, 4 KE/HF RLE motor: 3+ HF, 3/5 KE and 3/5 ADF/PF--motor exam stable.  Ambulates with his quad cane and  still tends to favor the left leg during gait and does not shift completely to the right side during weightbearing on the right. Has occasional catch  during passive range of motion in the right upper extremity and reflexes are 3+ in all fours. Musc:Pain with passive range of motion as noted above Psychpatient is generally pleasant and appropriate   Assessment & Plan:  1. 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 --Made referral to outpatient therapy at [facility] neuro rehab to address gait.  He is probably going to be limited to a visit or 2 given his Medicaid 2.  Pain Management:  -continue elavil25mg  qhs -gabapentin at 900mg  tid  3. Spasticity: Appears generally controlled maintainbaclofen at20mg  QID -Made referral to therapy for range of motion exercises -increase zanaflex to 2mg  qid 4. Anemia:follow up per primary  5. Right renal carcinoma s/p partial nephrectomy right (01/04/17), prostate cancer  -follow up with urology/oncology 6. . ETOH abuse/Tobacco: continue etoh abstinence 7. Right shoulder pain/capsulitis: Made referral to [facility] outpatient neuro rehab for occupational therapy to address range of motion and home exercise program.   80minutes of face to face patient care time were spent during this visit. All questions were encouraged and answered. Folllow up in  about 3 months.

## 2018-08-16 ENCOUNTER — Ambulatory Visit: Payer: Medicaid Other | Admitting: Occupational Therapy

## 2018-08-16 ENCOUNTER — Ambulatory Visit: Payer: Medicaid Other | Admitting: Rehabilitation

## 2018-08-20 ENCOUNTER — Other Ambulatory Visit: Payer: Self-pay

## 2018-08-20 ENCOUNTER — Ambulatory Visit: Payer: Medicaid Other | Admitting: Physical Therapy

## 2018-08-20 ENCOUNTER — Ambulatory Visit: Payer: Medicaid Other | Attending: Internal Medicine | Admitting: Physical Therapy

## 2018-08-20 DIAGNOSIS — M4712 Other spondylosis with myelopathy, cervical region: Secondary | ICD-10-CM

## 2018-08-20 DIAGNOSIS — M6249 Contracture of muscle, multiple sites: Secondary | ICD-10-CM | POA: Diagnosis present

## 2018-08-20 DIAGNOSIS — M25611 Stiffness of right shoulder, not elsewhere classified: Secondary | ICD-10-CM | POA: Diagnosis present

## 2018-08-20 DIAGNOSIS — M545 Low back pain: Secondary | ICD-10-CM | POA: Insufficient documentation

## 2018-08-20 DIAGNOSIS — M5412 Radiculopathy, cervical region: Secondary | ICD-10-CM

## 2018-08-20 DIAGNOSIS — R2689 Other abnormalities of gait and mobility: Secondary | ICD-10-CM | POA: Diagnosis present

## 2018-08-20 DIAGNOSIS — G8929 Other chronic pain: Secondary | ICD-10-CM | POA: Diagnosis present

## 2018-08-20 DIAGNOSIS — M6281 Muscle weakness (generalized): Secondary | ICD-10-CM | POA: Insufficient documentation

## 2018-08-20 DIAGNOSIS — R278 Other lack of coordination: Secondary | ICD-10-CM | POA: Diagnosis present

## 2018-08-20 DIAGNOSIS — R2681 Unsteadiness on feet: Secondary | ICD-10-CM | POA: Diagnosis present

## 2018-08-20 DIAGNOSIS — R208 Other disturbances of skin sensation: Secondary | ICD-10-CM | POA: Insufficient documentation

## 2018-08-20 DIAGNOSIS — M25511 Pain in right shoulder: Secondary | ICD-10-CM | POA: Diagnosis present

## 2018-08-20 NOTE — Therapy (Signed)
[facility] [address] [place], [place], [address] Phone: [phone]   Fax:  [phone]  Physical Therapy Evaluation  Patient Details  Name: Patrick Romero MRN: [id number] Date of Birth: 1961/03/18 Referring Provider (PT): Alger Simons, MD   Encounter Date: 08/20/2018  PT End of Session - 08/20/18 1212    Visit Number  1    Number of Visits  12    Authorization Type  Medicaid    PT Start Time  1025    PT Stop Time  1100    PT Time Calculation (min)  35 min    Activity Tolerance  Patient tolerated treatment well;Patient limited by pain    Behavior During Therapy  [facility] for tasks assessed/performed       Past Medical History:  Diagnosis Date  . Arthritis    ra  . Cancer [facility])    prostate   . Cancer of kidney ([place]) 2018   part of right kidney removed  . Chronic back pain    lower back burns some too  . Chronic neck pain   . Depression   . Herniated disc, cervical   . Hypertension   . Sickle cell trait ([place])   . Stroke [facility])    weakness in right hand and leg , numbness on left     Past Surgical History:  Procedure Laterality Date  . ROBOT ASSISTED LAPAROSCOPIC RADICAL PROSTATECTOMY N/A 09/13/2017   Procedure: XI ROBOTIC ASSISTED LAPAROSCOPIC RADICAL PROSTATECTOMY, BILATERAL PELVIC LYMPH NODE DISSECTION;  Surgeon: [name], MD;  [facility];  Service: Urology;  Laterality: N/A;  . ROBOTIC ASSITED PARTIAL NEPHRECTOMY Right 01/04/2017   Procedure: XI ROBOTIC ASSITED PARTIAL NEPHRECTOMY;  Surgeon: [name], MD;  [facility];  Service: Urology;  Laterality: Right;    There were no vitals filed for this visit.   Subjective Assessment - 08/20/18 1034    Subjective  This 58yo male was referred on 07/23/2018 by Alger Simons, MD with Central Cord Syndrome. Patient sustained central cord syndrome with fall 08/28/2016.  He saw Dr. Naaman Plummer for follow-up and he felt he needs more work with  right arm & leg. He fell twice in January.     Pertinent History  central cord syndrome fall 08/2016, HTN, renal carcinoma s/p partial nephrectomy 12/2016, prostate CA s/p prostatectomy, hx of alcohol abuse    Patient Stated Goals  To get more mobility in legs & strength in arm. Right handed.     Currently in Pain?  Yes    Pain Score  8    In last week, worst 10/10, best 7/10   Pain Location  Neck    Pain Orientation  Mid    Pain Descriptors / Indicators  Aching;Burning    Pain Type  Chronic pain    Pain Radiating Towards  into shoulders bilaterally    Pain Onset  More than a month ago    Pain Frequency  Constant    Aggravating Factors   sitting too long, walking distance    Pain Relieving Factors  heating pad,     Effect of Pain on Daily Activities  limits walking    Multiple Pain Sites  Yes    Pain Score  9   in last week, worst 9/10, best 6/10   Pain Location  Back    Pain Orientation  Mid;Lower    Pain Descriptors / Indicators  Aching;Burning;Spasm    Pain Type  Chronic pain  Pain Radiating Towards  bilateral hips    Pain Onset  More than a month ago    Pain Frequency  Constant    Aggravating Factors   walking    Pain Relieving Factors  heating pad    Pain Score  10   in last week, best 8/10   Pain Location  Arm    Pain Orientation  Right    Pain Descriptors / Indicators  Pins and needles;Jabbing;Burning    Pain Type  Chronic pain;Neuropathic pain    Pain Onset  More than a month ago    Pain Frequency  Constant    Aggravating Factors   hanging down    Pain Relieving Factors  support arm    Pain Score  7   in last week, worst 10/10, best 5/10   Pain Location  Leg    Pain Orientation  Right    Pain Descriptors / Indicators  Pins and needles;Jabbing;Burning    Pain Type  Chronic pain;Neuropathic pain    Pain Onset  More than a month ago    Pain Frequency  Constant    Aggravating Factors   walking    Pain Relieving Factors  resting, heating pad         [facility] PT  Assessment - 08/20/18 1030      Assessment   Medical Diagnosis  Central Cord Syndrome    Referring Provider (PT)  Alger Simons, MD    Onset Date/Surgical Date  07/23/18   MD referral to PT   Hand Dominance  Right    Prior Therapy  2018 had 7 PT visits      Precautions   Precautions  Fall      Balance Screen   Has the patient fallen in the past 6 months  Yes    How many times?  8   bruises only, right leg gives out   Has the patient had a decrease in activity level because of a fear of falling?   Yes    Is the patient reluctant to leave their home because of a fear of falling?   Yes      [place] residence    Living Arrangements  Parent;Children   28yo son   Type of [place] to enter    Entrance Stairs-Number of Steps  2    Entrance Stairs-Rails  Right;Left;Cannot reach both    [place]  One level    [place] - quad;Bedside commode;Tub bench;Grab bars - tub/shower      Prior Function   Level of Independence  Independent;Independent with household mobility with device;Independent with community mobility with device    Vocation  On disability    Leisure  fishing, basketball, hunting      Posture/Postural Control   Posture/Postural Control  Postural limitations    Postural Limitations  Rounded Shoulders;Forward head;Weight shift left      Tone   Assessment Location  Right Lower Extremity      ROM / Strength   AROM / PROM / Strength  Strength;PROM      PROM   Right Ankle Dorsiflexion  -12   limited by tone     Strength   Overall Strength  Deficits    Overall Strength Comments  LLE grossly 5/5 tested in sitting    Right Hip Flexion  3-/5    Right Hip Extension  3-/5  Right Hip ABduction  3-/5    Right Hip ADduction  3-/5    Right/Left Knee  Right    Right Knee Flexion  3-/5    Right Knee Extension  3-/5    Right Ankle Dorsiflexion  3-/5      Flexibility   Soft Tissue Assessment  /Muscle Length  yes    Hamstrings  right -61* left -30*   supine with hip flexed 90* & PT passively extending knee     Transfers   Transfers  Stand to Sit;Sit to Stand    Sit to Stand  5: Supervision;With upper extremity assist;With armrests;From chair/3-in-1    Stand to Sit  5: Supervision;With upper extremity assist;With armrests;To chair/3-in-1      Ambulation/Gait   Ambulation/Gait  Yes    Ambulation/Gait Assistance  5: Supervision    Ambulation/Gait Assistance Details  arrived using [facility], assessed with straight cane quad tip    Ambulation Distance (Feet)  150 Feet    Assistive device  Large base quad cane;Straight cane   quad tip on straight cane   Gait Pattern  Step-to pattern;Decreased arm swing - right;Decreased step length - left;Decreased stance time - right;Decreased stride length;Decreased hip/knee flexion - right;Decreased dorsiflexion - right;Decreased weight shift to right;Right hip hike;Right circumduction;Right foot flat;Antalgic;Abducted- right;Poor foot clearance - right    Ambulation Surface  Indoor;Level    Gait velocity  2.22 fts/sec with LBQC & 2.28 ft/sec with straight cane quad tip      Standardized Balance Assessment   Standardized Balance Assessment  Dynamic Gait Index      Dynamic Gait Index   Level Surface  Moderate Impairment   10' 9.00sec   Change in Gait Speed  Severe Impairment    Gait with Horizontal Head Turns  Moderate Impairment    Gait with Vertical Head Turns  Moderate Impairment    Gait and Pivot Turn  Mild Impairment    Step Over Obstacle  Severe Impairment    Step Around Obstacles  Mild Impairment    Steps  Moderate Impairment    Total Score  8      RLE Tone   RLE Tone  Hypertonic;Modified Ashworth      RLE Tone   Hypertonic Details  active & passive range limited by hypertonicity: ankle dorsiflexion, knee flexion & extension, all hip motions     Modified Ashworth Scale for Grading Hypertonia RLE  Considerable increase in muschle tone,  passive movement difficult   2 & 3 depending on position               Objective measurements completed on examination: See above findings.                PT Short Term Goals - 08/20/18 1432      PT SHORT TERM GOAL #1   Title  Patient demonstrates understanding of initial home exercise program to improve flexibility, strength & balace. (All STGs Target Date 3rd visit after evaluation)    Baseline  Patient is dependent in appropriate exercises with medical issues.     Time  3    Period  Weeks    Status  New      PT SHORT TERM GOAL #2   Title  Patient ambulates 300' outdoor on paved surfaces with straight cane with supervision.     Baseline  Patient ambulates 200' indoor surfaces with large base quad cane with supervision with deviations.     Time  3  Period  Weeks    Status  New      PT SHORT TERM GOAL #3   Title  Patient negotiates ramps & curbs with straight cane with supervision.     Baseline  Patient is dependent & unsafe negotiating ramps & curbs with LBQC.     Time  3    Period  Weeks    Status  New        PT Long Term Goals - 08/20/18 1421      PT LONG TERM GOAL #1   Title  Patient will verbalize and demonstrate understanding of ongoing home exercise program and fitness plan (All LTGs Target Date: 12 visit)    Baseline  patient is dependent in appropriate exercises with medical condition     Time  12    Period  Weeks    Status  New      PT LONG TERM GOAL #2   Title  Patient ambulates 500 feet outdoors on paved and grass surfaces modified independent with a single point came to enable community accessibility     Baseline  patient ambulate 200 feet with a large base quad cane with supervision with deviations    Time  12    Period  Weeks    Status  New      PT LONG TERM GOAL #3   Title  patient negotiates ramps, curbs and stairs with single point came modified independent to enable community access    Baseline  patient is dependent and  unsafe with negotiation of ramps, curbs and stairs and uses LBQC    Time  12    Period  Weeks    Status  New      PT LONG TERM GOAL #4   Title  dynamic gait index >/= 15/24 to indicate a lower fall risk     Baseline  Dynamic Gait Index 8/24    Time  12    Period  Weeks    Status  New      PT LONG TERM GOAL #5   Title  Gait velocity >3.0 ft/sec with single point cane to indicate improved community mobility    Baseline  Gait Velocity 2.22 ft/sec with [facility] with supervision    Time  12    Period  Weeks    Status  New             Plan - 08/20/18 1228    Clinical Impression Statement  This 58 year old male was referred to physical therapy [place] syndrome he has increased tone in the right upper and lower extremity an impaired flexibility limiting his mobility he has decreased strength in the right upper and lower extremity also impairing his mobility he has increased falls with 8 falls noted in the last 6 months He has gait deviations increasing his risk of falls. His dynamic gait index score of 8/24 also indicates a high risk of falls.      History and Personal Factors relevant to plan of care:  central cord syndrome fall 08/2016, HTN, renal carcinoma s/p partial nephrectomy 12/2016, prostate CA s/p prostatectomy,    Clinical Presentation  Stable    Clinical Decision Making  Low    Rehab Potential  Good    PT Frequency  1x / week    PT Duration  12 weeks    PT Treatment/Interventions  ADLs/Self Care Home Management;Moist Heat;DME Instruction;Electrical Stimulation;Therapeutic activities;Therapeutic exercise;Balance training;Neuromuscular re-education;Functional mobility training;Patient/family education;Manual techniques;Dry needling;Vestibular  PT Next Visit Plan  set up HEP for flexibility, strength & balance    Consulted and Agree with Plan of Care  Patient       Patient will benefit from skilled therapeutic intervention in order to improve the following deficits  and impairments:  Abnormal gait, Decreased activity tolerance, Decreased balance, Decreased coordination, Decreased endurance, Decreased knowledge of use of DME, Decreased mobility, Decreased range of motion, Decreased strength, Dizziness, Increased muscle spasms, Impaired flexibility, Impaired sensation, Impaired tone, Postural dysfunction, Pain  Visit Diagnosis: Muscle weakness (generalized)  Unsteadiness on feet  Other abnormalities of gait and mobility  Cervical myelopathy with cervical radiculopathy  Contracture of muscle, multiple sites  Chronic midline low back pain without sciatica     Problem List Patient Active Problem List   Diagnosis Date Noted  . Depression 05/29/2018  . Prostate cancer ([place]) 09/13/2017  . Hypertension 02/17/2017  . Renal carcinoma, right ([place]) 11/23/2016  . Atherosclerosis of aorta ([place]) 11/23/2016  . 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 Dysesthesia   . History of syncope   . Tetraparesis ([place])   . Central cord syndrome ([place])   . Anemia   . ETOH abuse   . Neuropathic pain   . Neck pain   . Chronic neck and back pain 09/01/2016  . Alcohol intoxication ([place]) 09/01/2016  . Tobacco abuse 09/01/2016    Jamey Reas PT, DPT 08/20/2018, 2:38 PM  [facility] [address] [place], [place], [address] Phone: [phone]   Fax:  [phone]  Name: Patrick Romero MRN: [id number] Date of Birth: [birth year]

## 2018-08-21 ENCOUNTER — Encounter: Payer: Self-pay | Admitting: Occupational Therapy

## 2018-08-21 ENCOUNTER — Ambulatory Visit: Payer: Medicaid Other | Admitting: Occupational Therapy

## 2018-08-21 ENCOUNTER — Other Ambulatory Visit: Payer: Self-pay

## 2018-08-21 DIAGNOSIS — R208 Other disturbances of skin sensation: Secondary | ICD-10-CM

## 2018-08-21 DIAGNOSIS — M6281 Muscle weakness (generalized): Secondary | ICD-10-CM

## 2018-08-21 DIAGNOSIS — R278 Other lack of coordination: Secondary | ICD-10-CM

## 2018-08-21 DIAGNOSIS — M25611 Stiffness of right shoulder, not elsewhere classified: Secondary | ICD-10-CM

## 2018-08-21 DIAGNOSIS — R2681 Unsteadiness on feet: Secondary | ICD-10-CM

## 2018-08-21 DIAGNOSIS — M25511 Pain in right shoulder: Secondary | ICD-10-CM

## 2018-08-21 DIAGNOSIS — G8929 Other chronic pain: Secondary | ICD-10-CM

## 2018-08-21 NOTE — Therapy (Signed)
[place] [address] [place], [place], [address] Phone: [phone]   Fax:  [phone]  Occupational Therapy Evaluation  Patient Details  Name: NASSER KU MRN: [id number] Date of Birth: 59-02-1961 No data recorded  Encounter Date: 59/05/2019  OT End of Session - 08/21/18 1600    Visit Number  1    Number of Visits  9    Authorization Type  Medicaid    Authorization Time Period  awaiting auth    OT Start Time  1400    OT Stop Time  1445    OT Time Calculation (min)  45 min    Behavior During Therapy  [facility] for tasks assessed/performed       Past Medical History:  Diagnosis Date  . Arthritis    ra  . Cancer [facility])    prostate   . Cancer of kidney ([place]) 2018   part of right kidney removed  . Chronic back pain    lower back burns some too  . Chronic neck pain   . Depression   . Herniated disc, cervical   . Hypertension   . Sickle cell trait ([place])   . Stroke [facility])    weakness in right hand and leg , numbness on left     Past Surgical History:  Procedure Laterality Date  . ROBOT ASSISTED LAPAROSCOPIC RADICAL PROSTATECTOMY N/A 09/13/2017   Procedure: XI ROBOTIC ASSISTED LAPAROSCOPIC RADICAL PROSTATECTOMY, BILATERAL PELVIC LYMPH NODE DISSECTION;  Surgeon: [name], MD;  Location: [facility];  Service: Urology;  Laterality: N/A;  . ROBOTIC ASSITED PARTIAL NEPHRECTOMY Right 01/04/2017   Procedure: XI ROBOTIC ASSITED PARTIAL NEPHRECTOMY;  Surgeon: [name], MD;  Location: [facility];  Service: Urology;  Laterality: Right;    There were no vitals filed for this visit.  Subjective Assessment - 08/21/18 1409    Subjective   Hoping to get more mobility in my hand, arm, leg - feels like a bit of a setback    Currently in Pain?  Yes    Pain Score  8     Pain Location  Shoulder    Pain Orientation  Right    Pain Descriptors / Indicators  Aching;Stabbing;Throbbing    Pain Type  Chronic pain    Pain Onset   More than a month ago    Pain Frequency  Constant    Aggravating Factors   upright - UE dependent position    Pain Relieving Factors  heat, supine        [facility] OT Assessment - 08/21/18 0001      Assessment   Medical Diagnosis  Central cord syndrome    Onset Date/Surgical Date  07/23/18    Hand Dominance  Right    Prior Therapy  2018 had 10 OT visits      Precautions   Precautions  Fall      Balance Screen   Has the patient fallen in the past 6 months  Yes      Prior Function   Level of Independence  Independent with basic ADLs    Vocation  On disability    Leisure  fishing, basketball, hunting prior, now read, play with girlfiriends grandchildren      ADL   Eating/Feeding  Needs assist with cutting food    Grooming  Minimal assistance    Upper Body Bathing  Modified independent    Lower Body Bathing  Modified independent    Upper Body Dressing  Minimal  assistance    Lower Body Dressing  Minimal assistance    Toilet Transfer  Modified independent    Aeronautical engineer  Bedside commode   over toilet   Toileting - Clothing Manipulation  Modified independent    Toileting -  Horticulturist, commercial bars;Transfer tub bench    ADL comments  Has an aide 5days/week 2 hrs day - cleaning, errands, MD appts      IADL   Prior Level of Veterinary surgeon for transportation    Prior Level of Function Light Housekeeping  Independent    Light Housekeeping  Performs light daily tasks such as dishwashing, bed making    Prior Level of Function Meal Prep  Independent    Meal Prep  Prepares adequate meal if supplied with ingredients    Prior Level of Function Medication Managment  independent    Medication Management  Is responsible for taking medication in correct dosages at correct time    Prior Level of Function Financial Management  independent     Psychiatrist financial matters independently (budgets, writes checks, pays rent, bills goes to bank), collects and keeps track of income      Written Expression   Dominant Hand  Right    Handwriting  90% legible      Vision - History   Baseline Vision  Wears glasses only for reading    Visual History  Cataracts    Patient Visual Report  Eye fatigue/eye pain/headache    Additional Comments  Looking for eye doctor to address cataracts      Vision Assessment   Eye Alignment  Within Functional Limits      Sensation   Light Touch  Impaired by gross assessment      Coordination   Gross Motor Movements are Fluid and Coordinated  No    Fine Motor Movements are Fluid and Coordinated  No    Finger Nose Finger Test  overshooting more pronounced right    9 Hole Peg Test  Right;Left    Right 9 Hole Peg Test  48.18    Left 9 Hole Peg Test  30      Tone   Assessment Location  Right Upper Extremity      ROM / Strength   AROM / PROM / Strength  AROM      AROM   Overall AROM   Deficits    AROM Assessment Site  Shoulder;Elbow;Forearm;Wrist    Right/Left Shoulder  Right;Left    Right Shoulder Flexion  65 Degrees    Right Shoulder ABduction  65 Degrees    Left Shoulder Flexion  116 Degrees    Left Shoulder ABduction  110 Degrees    Right Elbow Flexion  45    Right Elbow Extension  135    Right/Left Forearm  Right    Right Forearm Supination  45 Degrees    Right/Left Wrist  Right    Right Wrist Extension  --   neutral     Hand Function   Right Hand Gross Grasp  Impaired    Right Hand Grip (lbs)  15    Right Hand Lateral Pinch  5 lbs    Left Hand Gross Grasp  Functional    Left Hand Grip (lbs)  85    Left Hand Lateral Pinch  21 lbs  RUE Tone   RUE Tone  Moderate;Hypertonic;Modified Ashworth      RUE Tone   Hypertonic Details  Shoulder, elbow, wrist    Modified Ashworth Scale for Grading Hypertonia RUE  More marked increase in muscle tone through most of the  ROM, but affected part(s) easily moved                      OT Education - 08/21/18 1559    Education Details  Reviewed eval results, potential goals and plan of care - spread visits out to maximize time    Person(s) Educated  Patient    Methods  Explanation    Comprehension  Verbalized understanding       OT Short Term Goals - 08/21/18 1613      OT SHORT TERM GOAL #1   Title  Patient will complete a home exercise program designed to improve right upper extremity range of motion due 09/25/18    Baseline  Patient has prior HEP needs updating as spasticity and joint capsule restrictions more prevalent    Time  4    Period  Weeks    Status  New    Target Date  09/25/18      OT SHORT TERM GOAL #2   Title  Patient will demonstrate a 5 lb increase in hand strength right    Baseline  15lb    Time  4    Period  Weeks    Status  New      OT SHORT TERM GOAL #3   Title  Patient will cut food using two hands with modified independence    Baseline  Patient's mom typically cuts food for patient    Time  4    Period  Weeks    Status  New      OT SHORT TERM GOAL #4   Title  Patient will demonstrate 90 degrees of passive shoulder flexion, abduction while supine in right upper extremity with pain no greater than 4/10    Baseline  65 degrees of active shoulder flex/abd with 8/10 pain    Time  4    Period  Weeks    Status  New      OT SHORT TERM GOAL #5   Title  Patient will complete 9 hole peg test with RUE in 40 seconds or less to assist with fine motor coordination    Baseline  48.18    Time  4    Period  Weeks    Status  New        OT Long Term Goals - 08/21/18 1620      OT LONG TERM GOAL #1   Title  Patient will complete an update home exercise program to address active range of motion and strength throughout dominant RUE with pain no greater than 3/10    Baseline  Pain 8-9/10 - constant    Time  8    Period  Weeks    Status  New      OT LONG TERM GOAL #2    Title  Patient will demonstrate mid level reach to obtain lighteweight object from chest high shelf with RUE with min assist    Baseline  65 degrees shoulder flexion    Time  8    Status  New      OT LONG TERM GOAL #3   Title  Patient will demonstrate awareness of strategies to reduce shoulder pain at night to  help with sleep    Baseline  pain disrupts sleep pattern    Time  8    Period  Weeks    Status  New      OT LONG TERM GOAL #4   Title  Patient will demonstrate abilty to put on / take off and demonstrate awareness of wearing schedule for right UE orthosis    Baseline  Patient does not currently have hand orthosis    Time  8    Period  Weeks    Status  New      OT LONG TERM GOAL #5   Title  Patient will demonstrate awareness of compensatory strategies to help him button and zipper clothing    Baseline  Patient avoids buttons, zippers at this time    Time  8    Period  Weeks    Status  New            Plan - 08/21/18 1600    Clinical Impression Statement  Patient is a 58 year old man with history of fall with central cord syndrome 08/31/2016, referred to OT with increased pain in right upper extremity, increased falls (8 falls over past 6 months) ,  moderate hypertonicity in right extremities, diminished sensation in BUE's R>L, decreased active movement and strength in RUE, decreased coordiantion in RUE all of which limit his ability to complete ADL/IADL without assistance.  Patient has a paid caregiver 5 days/week, 2 hrs/day to assist with transportation, running errands, housekeeping, and some basic self care skills.  Patient will benefit from skilled OT intervention to increase functional use, and decrease pain in dominant right upper extremity, and to increase independence with ADL/IADL skills.      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     Occupational performance deficits (Please refer to evaluation for details):  ADL's;IADL's;Rest and Sleep;Leisure;Work    Neurosurgeon    OT Frequency  1x / week    OT Duration  8 weeks    OT Treatment/Interventions  Self-care/ADL training;Electrical Stimulation;Therapeutic exercise;Aquatic Therapy;Moist Heat;Neuromuscular education;Splinting;Patient/family education;Fluidtherapy;Therapist, nutritional;Therapeutic activities;Balance training;Ultrasound;DME and/or AE instruction;Manual Therapy;Passive range of motion    Plan  Review prior HEP, and update - review sleep positions, manual - right shoulder    Clinical Decision Making  Several treatment options, min-mod task modification necessary    Consulted and Agree with Plan of Care  Patient       Patient will benefit from skilled therapeutic intervention in order to improve the following deficits and impairments:  Increased edema, Pain, Decreased coordination, Decreased mobility, Impaired sensation, Improper body mechanics, Impaired tone, Increased muscle spasms, Decreased strength, Decreased range of motion, Decreased activity tolerance, Decreased balance, Impaired UE functional use  Visit Diagnosis: Muscle weakness (generalized) - Plan: Ot plan of care cert/re-cert  Stiffness of right shoulder, not elsewhere classified - Plan: Ot plan of care cert/re-cert  Chronic right shoulder pain - Plan: Ot plan of care cert/re-cert  Other disturbances of skin sensation - Plan: Ot plan of care cert/re-cert  Other lack of coordination - Plan: Ot plan of care cert/re-cert  Unsteadiness on feet - Plan: Ot plan of care cert/re-cert    Problem List Patient Active Problem List   Diagnosis Date Noted  . Depression 05/29/2018  . Prostate cancer ([place]) 09/13/2017  . Hypertension 02/17/2017  . Renal carcinoma, right ([place]) 11/23/2016  . Atherosclerosis  of aorta ([place]) 11/23/2016  . 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  Dysesthesia   . History of syncope   . Tetraparesis ([place])   . Central cord syndrome ([place])   . Anemia   . ETOH abuse   . Neuropathic pain   . Neck pain   . Chronic neck and back pain 09/01/2016  . Alcohol intoxication ([place]) 09/01/2016  . Tobacco abuse 09/01/2016    Mariah Milling, OTR/L 08/21/2018, 4:32 PM  [place] [address] [place], [place], [address] Phone: [phone]   Fax:  [phone]  Name: ODDIE BOTTGER MRN: [id number] Date of Birth: [birth year]

## 2018-08-23 ENCOUNTER — Encounter: Payer: Self-pay | Admitting: Internal Medicine

## 2018-08-23 ENCOUNTER — Ambulatory Visit: Payer: Medicaid Other | Admitting: Internal Medicine

## 2018-08-23 ENCOUNTER — Other Ambulatory Visit: Payer: Self-pay

## 2018-08-23 ENCOUNTER — Ambulatory Visit (HOSPITAL_COMMUNITY)
Admission: RE | Admit: 2018-08-23 | Discharge: 2018-08-23 | Disposition: A | Discharge status: Home / Self Care | Payer: Medicaid Other | Source: Ambulatory Visit | Attending: Family Medicine | Admitting: Family Medicine

## 2018-08-23 VITALS — BP 113/80 | HR 118 | Temp 99.4°F | Resp 16 | Ht 72.0 in | Wt 161.8 lb

## 2018-08-23 DIAGNOSIS — C641 Malignant neoplasm of right kidney, except renal pelvis: Secondary | ICD-10-CM

## 2018-08-23 DIAGNOSIS — R208 Other disturbances of skin sensation: Secondary | ICD-10-CM

## 2018-08-23 DIAGNOSIS — Z8546 Personal history of malignant neoplasm of prostate: Secondary | ICD-10-CM | POA: Insufficient documentation

## 2018-08-23 DIAGNOSIS — M542 Cervicalgia: Secondary | ICD-10-CM | POA: Insufficient documentation

## 2018-08-23 DIAGNOSIS — Z7901 Long term (current) use of anticoagulants: Secondary | ICD-10-CM | POA: Insufficient documentation

## 2018-08-23 DIAGNOSIS — Z7982 Long term (current) use of aspirin: Secondary | ICD-10-CM | POA: Insufficient documentation

## 2018-08-23 DIAGNOSIS — Z789 Other specified health status: Secondary | ICD-10-CM

## 2018-08-23 DIAGNOSIS — F17210 Nicotine dependence, cigarettes, uncomplicated: Secondary | ICD-10-CM | POA: Insufficient documentation

## 2018-08-23 DIAGNOSIS — M549 Dorsalgia, unspecified: Secondary | ICD-10-CM

## 2018-08-23 DIAGNOSIS — F329 Major depressive disorder, single episode, unspecified: Secondary | ICD-10-CM | POA: Insufficient documentation

## 2018-08-23 DIAGNOSIS — Z9079 Acquired absence of other genital organ(s): Secondary | ICD-10-CM

## 2018-08-23 DIAGNOSIS — D573 Sickle-cell trait: Secondary | ICD-10-CM

## 2018-08-23 DIAGNOSIS — Z79899 Other long term (current) drug therapy: Secondary | ICD-10-CM | POA: Insufficient documentation

## 2018-08-23 DIAGNOSIS — G8250 Quadriplegia, unspecified: Secondary | ICD-10-CM

## 2018-08-23 DIAGNOSIS — Z8249 Family history of ischemic heart disease and other diseases of the circulatory system: Secondary | ICD-10-CM

## 2018-08-23 DIAGNOSIS — F17200 Nicotine dependence, unspecified, uncomplicated: Secondary | ICD-10-CM

## 2018-08-23 DIAGNOSIS — I10 Essential (primary) hypertension: Secondary | ICD-10-CM | POA: Insufficient documentation

## 2018-08-23 DIAGNOSIS — I700 Atherosclerosis of aorta: Secondary | ICD-10-CM | POA: Insufficient documentation

## 2018-08-23 DIAGNOSIS — Z905 Acquired absence of kidney: Secondary | ICD-10-CM | POA: Insufficient documentation

## 2018-08-23 DIAGNOSIS — F1099 Alcohol use, unspecified with unspecified alcohol-induced disorder: Secondary | ICD-10-CM

## 2018-08-23 DIAGNOSIS — F1010 Alcohol abuse, uncomplicated: Secondary | ICD-10-CM | POA: Insufficient documentation

## 2018-08-23 DIAGNOSIS — Z85528 Personal history of other malignant neoplasm of kidney: Secondary | ICD-10-CM

## 2018-08-23 DIAGNOSIS — Z7289 Other problems related to lifestyle: Secondary | ICD-10-CM

## 2018-08-23 MED ORDER — ASPIRIN EC 81 MG PO TBEC: 81.0000 mg | DELAYED_RELEASE_TABLET | Freq: Every day | ORAL | 0 refills | Status: DC

## 2018-08-23 NOTE — Progress Notes (Signed)
Pt states he has pain in his right leg  

## 2018-08-23 NOTE — Patient Instructions (Signed)
Alcohol Use Education Information about Your Drinking Your score on the Alcohol Use Disorders Identification Test was: AUDIT C:    TOTAL AUDIT SCORE:   .  This score places you in the category of:  Score 0 = Abstainers Score 8-19 = Unhealthy/High Risk Drinkers  Score 1-7 = Low Risk Drinkers Score 20+ = Probable Alcohol Dependence   High Scores (20+) on the Alcohol Use Identification Test Consider becoming involved in a structured program.  You should stop drinking if: You have tried to cut down before but have not been successful, or  You suffer from morning shakes during a heavy drinking period, or You have high blood pressure, or You are pregnant, or You have liver disease, or You are taking medicines that react with alcohol, or Your alcohol use is affecting your social relationships, or You have legal consequences like DUIs, or You call in sick to work, or You cannot take care of our children, or Someone close to you says you drink too much    How Much Alcohol is a Drink: Beer: 12 oz. = 1 drink 16 oz. = 1.3 drinks 22 oz. = 2 drinks 40 oz. = 3.3 drinks  Wine: 5 oz. = 1 drink 740 mL (25 oz.) bottle = 5 drinks Malt Liquor: 12 oz. = 1.5 drinks 16 oz. = 2 drinks 22 oz. = 2.5 drinks 40 oz. = 4.5 drinks  80-Proof Spirits - Hard Liquor: 1 shot = 1 drink 1 mixed drink = number of shots Can equal 1-3 drinks   What is Low-risk Drinking? Have no more than 2 drinks of alcohol per day Drink no more than 5 days per week Do not drink alcohol drink alcohol when: You drive or operate machinery You are pregnant or breast feeding You are taking medications that interact with alcohol You have medical conditions made worse with alcohol You can stop or control your drinking      Identify Your Triggers for Drinking Parties Particular People Feeling lonely Feeling tense Family problems Feeling sad Feeling happy Feeling bored After work Problems  sleeping Criticism Feelings of failure After being paid When others are drinking In bars When out for dinner After arguments Weekends Feeling restless Being in pain   Effects of High-Risk Drinking To the Brain: Aggressive, irrational behavior Arguments, violence Depression, nervousness Alcohol dependence, memory loss To the Nervous System: Trembling hands, tingling fingers Numbness, painful nerves Impaired sensation leading to falls Numb tingling toes To Your Lifestyle: Social, legal, medical problems Domestic trouble/relationship loss Job loss & financial problems Shortened life span Accidents and death from drunk driving   To the Face: Premature aging, drinker's nose Cancer of the throat & mouth To the Body: Frequent cold Reduced resistance to infection Increased risk of pneumonia Weakness of heart muscle Heart failure, anemia Impaired blood clotting Breast cancer Vitamin deficiency, bleeding Severe Inflammation of the stomach Vomiting, diarrhea, malnutrition Ulcer, inflammation of the pancreas Impaired sexual performance Birth defects, including deformities, retardation, and low birthweight   Ways to Cope Without Drinking Go home if you tend to drink after work Find another activity Switch to nonalcoholic beverages Change friends Join a club Volunteer Visit relatives Plan/take a trip Go for a walk Take up a hobby Listen to music Talk to a friend Reading What would you do if you had no worries about failing?         Good Reasons for Drinking Less I will live longer - probably 8-10 years. I will sleep better. I   will be happier. I will save a lot of money My relationships will improve. I will stay younger for longer. I will achieve more in my life There will be a greater chance that I will survive to a healthy old age with no premature damage to my brain.  I will be better at my job. I will be less likely to feel depressed and commit  suicide (6 times less likely). I will be less likely to die of heart disease or cancer. Other people will respect me I will be less likely to get into trouble with the police. The possibility that I will die of liver disease will be dramatically reduced (12 times less likely). It will be less likely that I will die in a car accident (3 times less likely).   Strategies for Cutting Down Keep Track.  Find a way to keep track of how much you drink.  If you make a note of each drink before you drink it, this will help slow you down. Count and Measure.  Know the standard drink sizes.  Ask the bartender or server about the amount of alcohol in a mixed drink. Set Goals.  Decide how many days a week you will drink and how many drinks each day. Pace and Space.  When you do drink, pace yourself.  Have no more than one drink with alcohol per hour.  Alternate "drink spacers" non-alcoholic drinks such as water, soda, or juice with drinks containing alcohol. Include Food.  Don't drink on an empty stomach.  Have some food so the alcohol will be absorbed more slowly into your system.  Avoid Triggers.  Avoid people, places, or activities that have led to drinking in the past.  Certain times of day or feelings may also be triggers.  Make a plan so you will know what you can do instead of drinking. Plan to Handle Urges.  When an urge hits, consider these options:  Remind yourself of your reasons for changing.  Or talk it through with someone you trust. Or get involved with a healthy, distracting activity.  Or, "urge surf" - instead of fighting the feeling, accept it and ride it out, knowing it will soon crest like a wave and pass. Know Your "No".  Have a polite, convincing "no thanks" for those times when you may be offered a drink and don't want one.  The faster you can say no to these offers, the less likely you are to give in.  If you hesitate, it allows you time to think of excuses to go along.    

## 2018-08-23 NOTE — Progress Notes (Signed)
Patient ID: Patrick Romero, male    DOB: [birth year]  MRN: [id number]  CC: Hypertension   Subjective: Patrick Romero is a 58 y.o. male who presents for chronic ds management. 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  CCS/Quadriparesis: Referred for some home PT on last visit but that never materialized.  He has not had any falls since I last saw him.  He has seen his PMR physician Dr. Naaman Plummer and was referred for outpatient PT and OT.  He had his first visit about a week ago and the plan is for 9 visits of PT and OT.  R side has always been weaker than LT.  UDS positive for ETOH 2 x with Dr. Naaman Plummer.  Hydrocodone was discontinued because of this.  Patient states that hydrocodone was not working and he was not taking it often. Never took it when he drank.  Drinks beer "a couple 12 oz beer but not often - about 2-4 when watching sports" with his buddies.  States he did not drink any beer within 3 days of his last UDS but it was still positive for ETOH.  He is trying to get into another pain clinic.   Tob dep: no more than 1/2 pk/wk.  Still uses nicotine lozenges but not consistently.  He states that he also has the nicotine patches.    HTN:  No CP/SOB/palpitations.  Reports compliance with medication and low-salt diet.  History of prostate and renal carcinoma.  Followed by Dr. Tammi Klippel at [facility].  Last seen 08/02/2018.  States he was told he displays no signs or symptoms of recurrence.  Plan to have MRI b/w now and next visit in June.   -prescribed Cialis but too expensive.  He has the prescription today and plans to try to fill it at our clinic to see if it is cheaper  Atherosclerosis  seen on CT imaging 08/2016:  Takes an ASA daily OTC.  No chest pains or shortness of breath.  Anemia: This was improved when last checked in July of last year.  His iron studies at that time were normal.  Patient tells me today that he has a history of sickle cell trait  Patient Active Problem List   Diagnosis Date Noted  . Depression 05/29/2018  . Prostate cancer ([place]) 09/13/2017  . Hypertension 02/17/2017  . Renal carcinoma, right ([place]) 11/23/2016  . Atherosclerosis of aorta ([place]) 11/23/2016  . 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 Dysesthesia   . History of syncope   . Tetraparesis ([place])   . Central cord syndrome ([place])   . Anemia   . ETOH abuse   . Neuropathic pain   . Neck pain   . Chronic neck and back pain 09/01/2016  . Alcohol intoxication ([place]) 09/01/2016  . Tobacco abuse 09/01/2016     Current Outpatient Medications on File Prior to Visit  Medication Sig Dispense Refill  . amitriptyline (ELAVIL) 25 MG tablet Take 1 tablet (25 mg total) by mouth at bedtime. 30 tablet 1  . amLODipine (NORVASC) 5 MG tablet Take one tablet by mouth once a day. 30 tablet 2  . baclofen (LIORESAL) 20 MG tablet Take 1 tablet (20 mg total) by mouth 4 (four) times  daily. 120 tablet 1  . gabapentin (NEURONTIN) 300 MG capsule TAKE 3 CAPSULES (900 MG TOTAL) BY MOUTH 3 (THREE) TIMES DAILY. 270 capsule 1  . nicotine (NICODERM CQ - DOSED IN MG/24 HOURS) 21 mg/24hr patch Place 1 patch (21 mg total) onto the skin daily. (Patient taking differently: Place 21 mg onto the skin 2 (two) times a week. ) 28 patch 1  . tiZANidine (ZANAFLEX) 2 MG tablet TAKE 1 TABLET (2 MG TOTAL) BY MOUTH 4 (FOUR) TIMES DAILY. 120 tablet 1   No current facility-administered medications on file prior to visit.     No Known Allergies  Social History   Socioeconomic History  . Marital status: Single    Spouse name: Not on file  . Number of children: Not on file  . Years of education: Not on file  . Highest  education level: Not on file  Occupational History  . Not on file  Social Needs  . Financial resource strain: Not on file  . Food insecurity:    Worry: Not on file    Inability: Not on file  . Transportation needs:    Medical: Not on file    Non-medical: Not on file  Tobacco Use  . Smoking status: Current Every Day Smoker    Packs/day: 0.25    Types: Cigarettes  . Smokeless tobacco: Never Used  . Tobacco comment: pt is also using nicotine patch  Substance and Sexual Activity  . Alcohol use: Yes    Comment: occ beer   . Drug use: No  . Sexual activity: Yes  Lifestyle  . Physical activity:    Days per week: Not on file    Minutes per session: Not on file  . Stress: Not on file  Relationships  . Social connections:    Talks on phone: Not on file    Gets together: Not on file    Attends religious service: Not on file    Active member of club or organization: Not on file    Attends meetings of clubs or organizations: Not on file    Relationship status: Not on file  . Intimate partner violence:    Fear of current or ex partner: Not on file    Emotionally abused: Not on file    Physically abused: Not on file    Forced sexual activity: Not on file  Other Topics Concern  . Not on file  Social History Narrative  . Not on file    Family History  Problem Relation Age of Onset  . Hypertension Mother   . Lung cancer Father   . COPD Sister     Past Surgical History:  Procedure Laterality Date  . ROBOT ASSISTED LAPAROSCOPIC RADICAL PROSTATECTOMY N/A 09/13/2017   Procedure: XI ROBOTIC ASSISTED LAPAROSCOPIC RADICAL PROSTATECTOMY, BILATERAL PELVIC LYMPH NODE DISSECTION;  Surgeon: [name], MD;  Location: [facility];  Service: Urology;  Laterality: N/A;  . ROBOTIC ASSITED PARTIAL NEPHRECTOMY Right 01/04/2017   Procedure: XI ROBOTIC ASSITED PARTIAL NEPHRECTOMY;  Surgeon: [name], MD;  Location: [facility];  Service: Urology;  Laterality: Right;    ROS: Review of  Systems Negative except as stated above  PHYSICAL EXAM: BP 113/80   Pulse (!) 118   Temp 99.4 F (37.4 C) (Oral)   Resp 16   Ht 6' (1.829 m)   Wt 161 lb 12.8 oz (73.4 kg)   SpO2 97%   BMI 21.94 kg/m   Wt Readings from Last 3 Encounters:  08/23/18  161 lb 12.8 oz (73.4 kg)  07/23/18 163 lb (73.9 kg)  05/29/18 156 lb 12.8 oz (71.1 kg)    Physical Exam  General appearance - alert, well appearing, middle-aged African-American male and in no distress Mental status - normal mood, behavior, speech, dress, motor activity, and thought processes Neck - supple, no significant adenopathy Chest - clear to auscultation, no wheezes, rales or rhonchi, symmetric air entry Heart -mild tachycardia but regular.  No gallops or murmurs appreciated Musculoskeletal -ambulates with a 4 pronged cane.  Power in the left upper and lower extremities 4+/5.  Right upper and lower extremities 3+-4/5 Extremities -no lower extremity edema  EKG: Sinus tachycardia with rate of 101.  No ischemic changes CMP Latest Ref Rng & Units 09/14/2017 09/08/2017 02/22/2017  Glucose 65 - 99 mg/dL 132(H) 92 97  BUN 6 - 20 mg/dL 16 9 5(L)  Creatinine 0.61 - 1.24 mg/dL 1.19 1.01 1.16  Sodium 135 - 145 mmol/L 132(L) 138 137  Potassium 3.5 - 5.1 mmol/L 6.2(H) 4.1 3.5  Chloride 101 - 111 mmol/L 106 105 106  CO2 22 - 32 mmol/L 20(L) 22 21(L)  Calcium 8.9 - 10.3 mg/dL 8.4(L) 9.3 8.4(L)  Total Protein 6.0 - 8.5 g/dL - - -  Total Bilirubin 0.0 - 1.2 mg/dL - - -  Alkaline Phos 39 - 117 IU/L - - -  AST 0 - 40 IU/L - - -  ALT 0 - 44 IU/L - - -   Lipid Panel     Component Value Date/Time   CHOL 228 (H) 11/23/2016 1156   TRIG 208 (H) 11/23/2016 1156   HDL 73 11/23/2016 1156   CHOLHDL 3.1 11/23/2016 1156   LDLCALC 113 (H) 11/23/2016 1156    CBC    Component Value Date/Time   WBC 7.4 01/26/2018 1514   WBC 6.3 09/08/2017 1325   RBC 4.31 01/26/2018 1514   RBC 4.28 09/08/2017 1325   HGB 10.8 (L) 01/26/2018 1514   HCT 34.7 (L)  01/26/2018 1514   PLT 360 01/26/2018 1514   MCV 81 01/26/2018 1514   MCH 25.1 (L) 01/26/2018 1514   MCH 25.7 (L) 09/08/2017 1325   MCHC 31.1 (L) 01/26/2018 1514   MCHC 31.7 09/08/2017 1325   RDW 14.1 01/26/2018 1514   LYMPHSABS 2.2 02/22/2017 1528   LYMPHSABS 3.0 02/17/2017 1427   MONOABS 0.5 02/22/2017 1528   EOSABS 0.2 02/22/2017 1528   EOSABS 0.3 02/17/2017 1427   BASOSABS 0.0 02/22/2017 1528   BASOSABS 0.0 02/17/2017 1427    ASSESSMENT AND PLAN: 1. Quadriparesis ([place]) Chronic.  Followed by PMR Currently getting some PT and OT.  2. Alcohol use Went over how much is too much alcohol in one setting for a male.  Advised patient never to mix alcohol with narcotic medication Printed information on low risk drinking given to patient  3. Renal cell adenocarcinoma, right ([place]) Followed by urology.  No recurrence today  4. Atherosclerosis of aorta [facility]) Patient on over-the-counter baby aspirin.  Will check lipid profile today  5. Tobacco dependence Patient advised to quit smoking. Discussed health risks associated with smoking including lung and other types of cancers, chronic lung diseases and CV risks.. Pt has cut back.  He eventually wants to quit.  Encouraged him to use the lozenges or the nicotine patches consistently and set a quit date.  About 3 minutes spent on counseling.    6. Essential hypertension At goal.  Continue amlodipine. - CBC - Comprehensive metabolic panel -  Lipid panel    Patient was given the opportunity to ask questions.  Patient verbalized understanding of the plan and was able to repeat key elements of the plan.   Orders Placed This Encounter  Procedures  . CBC  . Comprehensive metabolic panel  . Lipid panel     Requested Prescriptions    No prescriptions requested or ordered in this encounter    Return in about 3 months (around 11/21/2018).  Karle Plumber, MD, FACP

## 2018-08-24 LAB — LIPID PANEL
CHOL/HDL RATIO: 3.9 ratio (ref 0.0–5.0)
CHOLESTEROL TOTAL: 177 mg/dL (ref 100–199)
HDL: 45 mg/dL (ref >39)
LDL CALC: 99 mg/dL (ref 0–99)
Triglycerides: 163 mg/dL — ABNORMAL HIGH (ref 0–149)
VLDL CHOLESTEROL CAL: 33 mg/dL (ref 5–40)

## 2018-08-24 LAB — CBC
HEMOGLOBIN: 12 g/dL — AB (ref 13.0–17.7)
Hematocrit: 38.3 % (ref 37.5–51.0)
MCH: 25.6 pg — ABNORMAL LOW (ref 26.6–33.0)
MCHC: 31.3 g/dL — AB (ref 31.5–35.7)
MCV: 82 fL (ref 79–97)
Platelets: 479 10*3/uL — ABNORMAL HIGH (ref 150–450)
RBC: 4.68 x10E6/uL (ref 4.14–5.80)
RDW: 13.9 % (ref 11.6–15.4)
WBC: 6.7 10*3/uL (ref 3.4–10.8)

## 2018-08-24 LAB — COMPREHENSIVE METABOLIC PANEL
ALBUMIN: 4.4 g/dL (ref 3.8–4.9)
ALK PHOS: 78 IU/L (ref 39–117)
ALT: 16 IU/L (ref 0–44)
AST: 17 IU/L (ref 0–40)
Albumin/Globulin Ratio: 1.3 (ref 1.2–2.2)
BUN / CREAT RATIO: 7 — AB (ref 9–20)
BUN: 9 mg/dL (ref 6–24)
Bilirubin Total: 0.6 mg/dL (ref 0.0–1.2)
CALCIUM: 9.6 mg/dL (ref 8.7–10.2)
CO2: 22 mmol/L (ref 20–29)
CREATININE: 1.28 mg/dL — AB (ref 0.76–1.27)
Chloride: 103 mmol/L (ref 96–106)
GFR, EST AFRICAN AMERICAN: 71 mL/min/{1.73_m2} (ref >59)
GFR, EST NON AFRICAN AMERICAN: 61 mL/min/{1.73_m2} (ref >59)
GLUCOSE: 90 mg/dL (ref 65–99)
Globulin, Total: 3.4 g/dL (ref 1.5–4.5)
Potassium: 4.8 mmol/L (ref 3.5–5.2)
SODIUM: 140 mmol/L (ref 134–144)
Total Protein: 7.8 g/dL (ref 6.0–8.5)

## 2018-08-26 ENCOUNTER — Other Ambulatory Visit: Payer: Self-pay | Admitting: Internal Medicine

## 2018-08-26 DIAGNOSIS — E7800 Pure hypercholesterolemia, unspecified: Secondary | ICD-10-CM

## 2018-08-26 MED ORDER — ATORVASTATIN CALCIUM 10 MG PO TABS: 10.0000 mg | ORAL_TABLET | Freq: Every day | ORAL | 6 refills | Status: DC

## 2018-08-27 ENCOUNTER — Ambulatory Visit: Payer: Medicaid Other | Admitting: Physical Therapy

## 2018-08-27 ENCOUNTER — Encounter: Payer: Self-pay | Admitting: Physical Therapy

## 2018-08-27 DIAGNOSIS — M545 Low back pain, unspecified: Secondary | ICD-10-CM

## 2018-08-27 DIAGNOSIS — M6281 Muscle weakness (generalized): Secondary | ICD-10-CM

## 2018-08-27 DIAGNOSIS — R2689 Other abnormalities of gait and mobility: Secondary | ICD-10-CM

## 2018-08-27 DIAGNOSIS — G8929 Other chronic pain: Secondary | ICD-10-CM

## 2018-08-27 DIAGNOSIS — R2681 Unsteadiness on feet: Secondary | ICD-10-CM

## 2018-08-27 NOTE — Patient Instructions (Addendum)
Access Code: [email or web address]  URL: [email or web address]  Date: 08/27/2018  Prepared by: Jamey Reas   Exercises  Seated Hamstring Stretch - 3 reps - 1 sets - 30 seconds hold - 2x daily - 7x weekly  Seated Gastroc Stretch with Strap - 3 reps - 1 sets - 30 seconds hold - 2x daily - 7x weekly  Seated Hamstring Stretch with Strap - 3 reps - 1 sets - 30 seconds hold - 2x daily - 7x weekly  Seated Lumbar Flexion Stretch - 2 reps - 1 sets - 30 hold - 2x daily - 7x weekly  Hooklying Single Knee to Chest - 2 reps - 1 sets - 30 hold - 2x daily - 7x weekly  Supine Double Knee to Chest - 2 reps - 1 sets - 30 hold - 2x daily - 7x weekly  Supine Posterior Pelvic Tilt - 10 reps - 1 sets - 5 seconds hold - 1x daily - 5x weekly  Supine Lower Trunk Rotation - 10 reps - 1 sets - 5 seconds hold - 1x daily - 5x weekly  Standing hip adductor stretch - 2 reps - 1 set - 30 second hold - 2x daily - 7x weekly Patient Education  Ice  Heat

## 2018-08-27 NOTE — Therapy (Signed)
[facility] [address] [place], [place], [address] Phone: [phone]   Fax:  [phone]  Physical Therapy Treatment  Patient Details  Name: LARIN WEISSBERG MRN: [id number] Date of Birth: March 23, 1961 Referring Provider (PT): Alger Simons, MD   Encounter Date: 08/27/2018  PT End of Session - 08/27/18 2158    Visit Number  2    Number of Visits  12    Authorization Type  Medicaid    Authorization Time Period  3 visits 08/27/2018 - 09/16/2018    Authorization - Visit Number  1    Authorization - Number of Visits  3    PT Start Time  0930    PT Stop Time  1015    PT Time Calculation (min)  45 min    Equipment Utilized During Treatment  Gait belt    Activity Tolerance  Patient tolerated treatment well;Patient limited by pain    Behavior During Therapy  [facility] for tasks assessed/performed       Past Medical History:  Diagnosis Date  . Arthritis    ra  . Cancer [facility])    prostate   . Cancer of kidney ([place]) 2018   part of right kidney removed  . Chronic back pain    lower back burns some too  . Chronic neck pain   . Depression   . Herniated disc, cervical   . Hypertension   . Sickle cell trait ([place])   . Stroke [facility])    weakness in right hand and leg , numbness on left     Past Surgical History:  Procedure Laterality Date  . ROBOT ASSISTED LAPAROSCOPIC RADICAL PROSTATECTOMY N/A 09/13/2017   Procedure: XI ROBOTIC ASSISTED LAPAROSCOPIC RADICAL PROSTATECTOMY, BILATERAL PELVIC LYMPH NODE DISSECTION;  Surgeon: [name], MD;  [facility];  Service: Urology;  Laterality: N/A;  . ROBOTIC ASSITED PARTIAL NEPHRECTOMY Right 01/04/2017   Procedure: XI ROBOTIC ASSITED PARTIAL NEPHRECTOMY;  Surgeon: [name], MD;  [facility];  Service: Urology;  Laterality: Right;    There were no vitals filed for this visit.  Subjective Assessment - 08/27/18 0935    Subjective  No falls. His back started hurting  Wednesday or Thursday last week. No known etiology but has history of issues.     Pertinent History  central cord syndrome fall 08/2016, HTN, renal carcinoma s/p partial nephrectomy 12/2016, prostate CA s/p prostatectomy, hx of alcohol abuse    Patient Stated Goals  To get more mobility in legs & strength in arm. Right handed.     Currently in Pain?  Yes    Pain Score  10-Worst pain ever    Pain Location  Back    Pain Orientation  Lower;Left;Right    Pain Descriptors / Indicators  Tightness;Spasm;Sharp    Pain Type  Chronic pain    Pain Radiating Towards  bilaterally hips to toes    Pain Onset  More than a month ago    Pain Frequency  Intermittent    Aggravating Factors   unknown    Pain Relieving Factors  heat pad, supine, ice pack             Access Code: [email or web address]  URL: [email or web address]  Date: 08/27/2018  Prepared by: Jamey Reas   Exercises  Seated Hamstring Stretch - 3 reps - 1 sets - 30 seconds hold - 2x daily - 7x weekly  Seated Gastroc Stretch with Strap - 3 reps - 1 sets - 30 seconds  hold - 2x daily - 7x weekly  Seated Hamstring Stretch with Strap - 3 reps - 1 sets - 30 seconds hold - 2x daily - 7x weekly  Seated Lumbar Flexion Stretch - 2 reps - 1 sets - 30 hold - 2x daily - 7x weekly  Hooklying Single Knee to Chest - 2 reps - 1 sets - 30 hold - 2x daily - 7x weekly  Supine Double Knee to Chest - 2 reps - 1 sets - 30 hold - 2x daily - 7x weekly  Supine Posterior Pelvic Tilt - 10 reps - 1 sets - 5 seconds hold - 1x daily - 5x weekly  Supine Lower Trunk Rotation - 10 reps - 1 sets - 5 seconds hold - 1x daily - 5x weekly  Standing hip adductor stretch - 2 reps - 1 set - 30 second hold - 2x daily - 7x weekly    [facility] Adult PT Treatment/Exercise - 08/27/18 0930      Ambulation/Gait   Ambulation/Gait  Yes    Ambulation/Gait Assistance  5: Supervision    Ambulation/Gait Assistance Details  tactile & verbal cues on technique with cane quad tip, proper  step width /not scissoring    Ambulation Distance (Feet)  250 Feet    Assistive device  Straight cane   quad tip   Ambulation Surface  Indoor;Level               PT Short Term Goals - 08/27/18 2159      PT SHORT TERM GOAL #1   Title  Patient demonstrates understanding of initial home exercise program to improve flexibility, strength & balace. (All STGs Target Date 3rd visit after evaluation)    Time  3    Period  Weeks    Status  On-going      PT SHORT TERM GOAL #2   Title  Patient ambulates 300' outdoor on paved surfaces with straight cane with supervision.     Time  3    Period  Weeks    Status  On-going      PT SHORT TERM GOAL #3   Title  Patient negotiates ramps & curbs with straight cane with supervision.     Time  3    Period  Weeks    Status  On-going        PT Long Term Goals - 08/27/18 2200      PT LONG TERM GOAL #1   Title  Patient will verbalize and demonstrate understanding of ongoing home exercise program and fitness plan (All LTGs Target Date: 12 visit)    Baseline  patient is dependent in appropriate exercises with medical condition     Time  12    Period  Weeks    Status  On-going      PT LONG TERM GOAL #2   Title  Patient ambulates 500 feet outdoors on paved and grass surfaces modified independent with a single point came to enable community accessibility     Baseline  patient ambulate 200 feet with a large base quad cane with supervision with deviations    Time  12    Period  Weeks    Status  On-going      PT LONG TERM GOAL #3   Title  patient negotiates ramps, curbs and stairs with single point came modified independent to enable community access    Baseline  patient is dependent and unsafe with negotiation of ramps, curbs and stairs and uses [facility]  Time  12    Period  Weeks    Status  On-going      PT LONG TERM GOAL #4   Title  dynamic gait index >/= 15/24 to indicate a lower fall risk     Baseline  Dynamic Gait Index 8/24    Time   12    Period  Weeks    Status  On-going      PT LONG TERM GOAL #5   Title  Gait velocity >3.0 ft/sec with single point cane to indicate improved community mobility    Baseline  Gait Velocity 2.22 ft/sec with [facility] with supervision    Time  12    Period  Weeks    Status  On-going            Plan - 08/27/18 2201    Clinical Impression Statement  PT instructed in back HEP. He reported less pain after stretches with HEP. PT worked on gait with proper step width / not scissoring.     Rehab Potential  Good    PT Frequency  1x / week    PT Duration  12 weeks    PT Treatment/Interventions  ADLs/Self Care Home Management;Moist Heat;DME Instruction;Electrical Stimulation;Therapeutic activities;Therapeutic exercise;Balance training;Neuromuscular re-education;Functional mobility training;Patient/family education;Manual techniques;Dry needling;Vestibular    PT Next Visit Plan  review HEP for flexibility & add HEP for strength & balance    Consulted and Agree with Plan of Care  Patient       Patient will benefit from skilled therapeutic intervention in order to improve the following deficits and impairments:  Abnormal gait, Decreased activity tolerance, Decreased balance, Decreased coordination, Decreased endurance, Decreased knowledge of use of DME, Decreased mobility, Decreased range of motion, Decreased strength, Dizziness, Increased muscle spasms, Impaired flexibility, Impaired sensation, Impaired tone, Postural dysfunction, Pain  Visit Diagnosis: Muscle weakness (generalized)  Unsteadiness on feet  Other abnormalities of gait and mobility  Chronic midline low back pain without sciatica     Problem List Patient Active Problem List   Diagnosis Date Noted  . Depression 05/29/2018  . Prostate cancer ([place]) 09/13/2017  . Hypertension 02/17/2017  . Renal carcinoma, right ([place]) 11/23/2016  . Atherosclerosis of aorta ([place]) 11/23/2016  . 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   . Dysesthesia   . History of syncope   . Tetraparesis ([place])   . Central cord syndrome ([place])   . Anemia   . ETOH abuse   . Neuropathic pain   . Neck pain   . Chronic neck and back pain 09/01/2016  . Alcohol intoxication ([place]) 09/01/2016  . Tobacco abuse 09/01/2016    Jamey Reas PT, DPT 08/27/2018, 10:04 PM  [facility] [address] [place], [place], [address] Phone: [phone]   Fax:  [phone]  Name: ERRIN CHEWNING MRN: [id number] Date of Birth: 12/25/1960

## 2018-08-29 ENCOUNTER — Telehealth: Payer: Self-pay

## 2018-08-29 NOTE — Telephone Encounter (Signed)
Contacted pt to go over lab results pt is aware and doesn't have any questions or concerns 

## 2018-08-30 ENCOUNTER — Ambulatory Visit: Payer: Medicaid Other | Admitting: Occupational Therapy

## 2018-09-05 ENCOUNTER — Ambulatory Visit: Payer: Medicaid Other | Admitting: Physical Therapy

## 2018-09-05 ENCOUNTER — Encounter: Payer: Self-pay | Admitting: Physical Therapy

## 2018-09-05 DIAGNOSIS — M6281 Muscle weakness (generalized): Secondary | ICD-10-CM

## 2018-09-05 DIAGNOSIS — R2681 Unsteadiness on feet: Secondary | ICD-10-CM

## 2018-09-05 DIAGNOSIS — M545 Low back pain: Secondary | ICD-10-CM

## 2018-09-05 DIAGNOSIS — G8929 Other chronic pain: Secondary | ICD-10-CM

## 2018-09-05 DIAGNOSIS — R2689 Other abnormalities of gait and mobility: Secondary | ICD-10-CM

## 2018-09-05 NOTE — Patient Instructions (Signed)
Access Code: [email or web address]  URL: [email or web address]  Date: 09/05/2018  Prepared by: Jamey Reas   Exercises  Seated Hamstring Stretch - 3 reps - 1 sets - 30 seconds hold - 2x daily - 7x weekly  Seated Gastroc Stretch with Strap - 3 reps - 1 sets - 30 seconds hold - 2x daily - 7x weekly  Seated Hamstring Stretch with Strap - 3 reps - 1 sets - 30 seconds hold - 2x daily - 7x weekly  Seated Lumbar Flexion Stretch - 2 reps - 1 sets - 30 hold - 2x daily - 7x weekly  Hooklying Single Knee to Chest - 2 reps - 1 sets - 30 hold - 2x daily - 7x weekly  Supine Double Knee to Chest - 2 reps - 1 sets - 30 hold - 2x daily - 7x weekly  Supine Posterior Pelvic Tilt - 10 reps - 1 sets - 5 seconds hold - 1x daily - 5x weekly  Supine Lower Trunk Rotation - 10 reps - 1 sets - 5 seconds hold - 1x daily - 5x weekly  Squat with Chair and Counter Support - 10 reps - 1 sets - 5 seconds hold - 1x daily - 5x weekly  Standing Hip Abduction with Counter Support - 10 reps - 1 sets - 5 seconds hold - 1x daily - 5x weekly  Standing Hip Extension with Counter Support - 10 reps - 1 sets - 5 seconds hold - 1x daily - 5x weekly  Standing Hip Flexion - 10 reps - 1 sets - 5 seconds hold - 1x daily - 5x weekly  Standing Toe Raises at Chair - 10 reps - 1 sets - 5 seconds hold - 1x daily - 5x weekly  Standing Heel Raise with Support - 10 reps - 1 sets - 5 seconds hold - 1x daily - 5x weekly  Standing Alternating Knee Flexion - 10 reps - 1 sets - 5 seconds hold - 1x daily - 5x weekly

## 2018-09-06 ENCOUNTER — Ambulatory Visit: Payer: Medicaid Other | Admitting: Occupational Therapy

## 2018-09-06 ENCOUNTER — Encounter: Payer: Self-pay | Admitting: Occupational Therapy

## 2018-09-06 DIAGNOSIS — R208 Other disturbances of skin sensation: Secondary | ICD-10-CM

## 2018-09-06 DIAGNOSIS — M25611 Stiffness of right shoulder, not elsewhere classified: Secondary | ICD-10-CM

## 2018-09-06 DIAGNOSIS — G8929 Other chronic pain: Secondary | ICD-10-CM

## 2018-09-06 DIAGNOSIS — R2681 Unsteadiness on feet: Secondary | ICD-10-CM

## 2018-09-06 DIAGNOSIS — M6281 Muscle weakness (generalized): Secondary | ICD-10-CM | POA: Diagnosis not present

## 2018-09-06 DIAGNOSIS — M25511 Pain in right shoulder: Secondary | ICD-10-CM

## 2018-09-06 DIAGNOSIS — R278 Other lack of coordination: Secondary | ICD-10-CM

## 2018-09-06 NOTE — Therapy (Signed)
[facility] [address] [place], [place], [address] Phone: [phone]   Fax:  [phone]  Physical Therapy Treatment  Patient Details  Name: Patrick Romero MRN: [id number] Date of Birth: 1961/05/03 Referring Provider (PT): Alger Simons, MD   Encounter Date: 09/05/2018  PT End of Session - 09/05/18 1210    Visit Number  3    Number of Visits  12    Authorization Type  Medicaid    Authorization Time Period  3 visits 08/27/2018 - 09/16/2018    Authorization - Visit Number  2    Authorization - Number of Visits  3    PT Start Time  0930    PT Stop Time  1015    PT Time Calculation (min)  45 min    Equipment Utilized During Treatment  Gait belt    Activity Tolerance  Patient tolerated treatment well;Patient limited by pain    Behavior During Therapy  [facility] for tasks assessed/performed       Past Medical History:  Diagnosis Date  . Arthritis    ra  . Cancer [facility])    prostate   . Cancer of kidney ([place]) 2018   part of right kidney removed  . Chronic back pain    lower back burns some too  . Chronic neck pain   . Depression   . Herniated disc, cervical   . Hypertension   . Sickle cell trait ([place])   . Stroke [facility])    weakness in right hand and leg , numbness on left     Past Surgical History:  Procedure Laterality Date  . ROBOT ASSISTED LAPAROSCOPIC RADICAL PROSTATECTOMY N/A 09/13/2017   Procedure: XI ROBOTIC ASSISTED LAPAROSCOPIC RADICAL PROSTATECTOMY, BILATERAL PELVIC LYMPH NODE DISSECTION;  Surgeon: [name], MD;  Location: [facility];  Service: Urology;  Laterality: N/A;  . ROBOTIC ASSITED PARTIAL NEPHRECTOMY Right 01/04/2017   Procedure: XI ROBOTIC ASSITED PARTIAL NEPHRECTOMY;  Surgeon: [name], MD;  Location: [facility];  Service: Urology;  Laterality: Right;    There were no vitals filed for this visit.  Subjective Assessment - 09/05/18 0930    Subjective  Monday evening was walking small dog in  back yard & dog pulled wrapping leash around feet. He  fell on right shoulder. Exercises are helping his back.     Pertinent History  central cord syndrome fall 08/2016, HTN, renal carcinoma s/p partial nephrectomy 12/2016, prostate CA s/p prostatectomy, hx of alcohol abuse    Patient Stated Goals  To get more mobility in legs & strength in arm. Right handed.     Currently in Pain?  Yes    Pain Score  10-Worst pain ever    Pain Location  Shoulder    Pain Orientation  Right;Lateral    Pain Descriptors / Indicators  Burning;Aching    Pain Type  Chronic pain;Acute pain   chronic pain with increase due to fall on 2/24   Pain Onset  More than a month ago    Pain Frequency  Constant    Aggravating Factors   moving arm,     Pain Relieving Factors  ice, arthritic cream      PT reviewed back exercises with handout and verbalizes understanding. He also reports exercises help back pain.  PT added exercises for strength & balance.                  [facility] Adult PT Treatment/Exercise - 09/05/18 0930  Ambulation/Gait   Ambulation/Gait  Yes    Ambulation/Gait Assistance  5: Supervision    Ambulation Distance (Feet)  250 Feet    Assistive device  Straight cane   quad tip            PT Education - 09/05/18 0930    Education Details  added strength & balance exercises to HEP Access Code: [email or web address]     Person(s) Educated  Patient    Methods  Explanation;Demonstration;Tactile cues;Verbal cues;Handout    Comprehension  Verbalized understanding;Returned demonstration;Verbal cues required;Tactile cues required;Need further instruction       PT Short Term Goals - 08/27/18 2159      PT SHORT TERM GOAL #1   Title  Patient demonstrates understanding of initial home exercise program to improve flexibility, strength & balace. (All STGs Target Date 3rd visit after evaluation)    Time  3    Period  Weeks    Status  On-going      PT SHORT TERM GOAL #2   Title  Patient ambulates 300'  outdoor on paved surfaces with straight cane with supervision.     Time  3    Period  Weeks    Status  On-going      PT SHORT TERM GOAL #3   Title  Patient negotiates ramps & curbs with straight cane with supervision.     Time  3    Period  Weeks    Status  On-going        PT Long Term Goals - 08/27/18 2200      PT LONG TERM GOAL #1   Title  Patient will verbalize and demonstrate understanding of ongoing home exercise program and fitness plan (All LTGs Target Date: 12 visit)    Baseline  patient is dependent in appropriate exercises with medical condition     Time  12    Period  Weeks    Status  On-going      PT LONG TERM GOAL #2   Title  Patient ambulates 500 feet outdoors on paved and grass surfaces modified independent with a single point came to enable community accessibility     Baseline  patient ambulate 200 feet with a large base quad cane with supervision with deviations    Time  12    Period  Weeks    Status  On-going      PT LONG TERM GOAL #3   Title  patient negotiates ramps, curbs and stairs with single point came modified independent to enable community access    Baseline  patient is dependent and unsafe with negotiation of ramps, curbs and stairs and uses [facility]    Time  12    Period  Weeks    Status  On-going      PT LONG TERM GOAL #4   Title  dynamic gait index >/= 15/24 to indicate a lower fall risk     Baseline  Dynamic Gait Index 8/24    Time  12    Period  Weeks    Status  On-going      PT LONG TERM GOAL #5   Title  Gait velocity >3.0 ft/sec with single point cane to indicate improved community mobility    Baseline  Gait Velocity 2.22 ft/sec with [facility] with supervision    Time  12    Period  Weeks    Status  On-going            Plan - 09/05/18  1800    Clinical Impression Statement  PT instructed in additional exercises that address strength & balance deficits. He appears to have general understanding. He is on target to meet STGs by 3rd  treatment session.     Rehab Potential  Good    PT Frequency  1x / week    PT Duration  12 weeks    PT Treatment/Interventions  ADLs/Self Care Home Management;Moist Heat;DME Instruction;Electrical Stimulation;Therapeutic activities;Therapeutic exercise;Balance training;Neuromuscular re-education;Functional mobility training;Patient/family education;Manual techniques;Dry needling;Vestibular    PT Next Visit Plan  check STGs & submit for additional visits    PT Home Exercise Plan  Medbridge Access Code: [email or web address]     Consulted and Agree with Plan of Care  Patient       Patient will benefit from skilled therapeutic intervention in order to improve the following deficits and impairments:  Abnormal gait, Decreased activity tolerance, Decreased balance, Decreased coordination, Decreased endurance, Decreased knowledge of use of DME, Decreased mobility, Decreased range of motion, Decreased strength, Dizziness, Increased muscle spasms, Impaired flexibility, Impaired sensation, Impaired tone, Postural dysfunction, Pain  Visit Diagnosis: Muscle weakness (generalized)  Unsteadiness on feet  Other abnormalities of gait and mobility  Chronic midline low back pain without sciatica     Problem List Patient Active Problem List   Diagnosis Date Noted  . Depression 05/29/2018  . Prostate cancer ([place]) 09/13/2017  . Hypertension 02/17/2017  . Renal carcinoma, right ([place]) 11/23/2016  . Atherosclerosis of aorta ([place]) 11/23/2016  . 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 Dysesthesia   . History of syncope   . Tetraparesis ([place])   . Central cord syndrome ([place])   . Anemia   . ETOH abuse   . Neuropathic pain   . Neck pain   . Chronic neck and back pain 09/01/2016  . Alcohol intoxication ([place]) 09/01/2016  . Tobacco abuse 09/01/2016    Jamey Reas PT, DPT 09/06/2018, 6:20 AM  [facility] [place] [address] [place], [place],  [address] Phone: [phone]   Fax:  [phone]  Name: LANDO ALCALDE MRN: [id number] Date of Birth: 22-Apr-1961

## 2018-09-06 NOTE — Patient Instructions (Addendum)
1. Grip Strengthening (Resistive Putty) red putty.  Make a fat hot dog in between each squeeze.  COUNT TO 5 as you squeeze to make sure you are doing a long hard squeeze.    Squeeze putty using thumb and all fingers. Repeat _10___ times. Do __3__ sessions per day. This means you will do 10 each day. Keep it on your table and do it after breakfast, after lunch and after dinner.  Spread the sessions out so your hand doesn't get too tight   2. Roll putty into tube on table and pinch between your fingers.  Do 3 times.  Do 3 sessions per day.  Keep it on your table and it after breakfast, after lunch and after dinner. This means you will do 9 total each day.  Spread the sessions.     Coordination Activities  Perform the following activities for 15-20  minutes 1-2 times per day with right hand(s).  It is perfectly ok to take a rest as you do these activities.    Rotate ball in fingertips (clockwise and counter-clockwise).  Toss ball between hands.  Toss ball in air and catch with the same hand.  Flip cards 1 at a time.  Focus on accuracy.  Do NOT slide the cards to pick them up.   Deal cards with your thumb (Hold deck in hand and push card off top with thumb).  ONLY DO 15 CARDS AT TIME AND TRY AND KEEP YOUR SHOULDER RELAXED.   Pick up coins and place in container or coin bank.  Pick up coins and make stacks of 5 pennies. .  Pick up coins one at a time until you get 5 in your hand, then move coins from palm to fingertips and place in coin bank. REST IN BETWEEN EACH ROUND for at least 20 seconds.   Screw together nuts and bolts, then unfasten.     Copyright  VHI. All rights reserved.

## 2018-09-06 NOTE — Therapy (Signed)
[facility] [address] [place], [place], [address] Phone: [phone]   Fax:  [phone]  Occupational Therapy Treatment  Patient Details  Name: Patrick Romero MRN: [id number] Date of Birth: 07/08/1961 No data recorded  Encounter Date: 09/06/2018  OT End of Session - 09/06/18 0902    Visit Number  2    Authorization Type  Medicaid - approved for 8 treatments by 10/24/2018    OT Start Time  0802    OT Stop Time  0845    OT Time Calculation (min)  43 min    Activity Tolerance  Patient tolerated treatment well       Past Medical History:  Diagnosis Date  . Arthritis    ra  . Cancer [facility])    prostate   . Cancer of kidney ([place]) 2018   part of right kidney removed  . Chronic back pain    lower back burns some too  . Chronic neck pain   . Depression   . Herniated disc, cervical   . Hypertension   . Sickle cell trait ([place])   . Stroke [facility])    weakness in right hand and leg , numbness on left     Past Surgical History:  Procedure Laterality Date  . ROBOT ASSISTED LAPAROSCOPIC RADICAL PROSTATECTOMY N/A 09/13/2017   Procedure: XI ROBOTIC ASSISTED LAPAROSCOPIC RADICAL PROSTATECTOMY, BILATERAL PELVIC LYMPH NODE DISSECTION;  Surgeon: [name], MD;  [facility];  Service: Urology;  Laterality: N/A;  . ROBOTIC ASSITED PARTIAL NEPHRECTOMY Right 01/04/2017   Procedure: XI ROBOTIC ASSITED PARTIAL NEPHRECTOMY;  Surgeon: [name], MD;  [facility];  Service: Urology;  Laterality: Right;    There were no vitals filed for this visit.  Subjective Assessment - 09/06/18 0804    Subjective   I fell Monday night letting the dog out - I hurt my shoulder (however pt rated pain in R shoulder the same one eval).  I think the pain is coming from tightness - it feels like muscle pain not joint pain.     Pertinent History  Central cord syndrome due to fall 08/31/2016.      Patient Stated Goals  hoping to get more  mobility in arm, hand and leg    Currently in Pain?  Yes    Pain Score  8     Pain Location  Shoulder    Pain Orientation  Right    Pain Descriptors / Indicators  Dull;Sharp;Throbbing    Pain Type  Acute pain    Pain Onset  In the past 7 days    Pain Frequency  Constant    Aggravating Factors   lateral movement or try and raise it too high    Pain Relieving Factors  ice, heat pad                   OT Treatments/Exercises (OP) - 09/06/18 0001      Exercises   Exercises  Hand      Hand Exercises   Other Hand Exercises  Pt issued HEP to address grip strength using red putty and coordination.  After instruction and practice, pt able to return demonstrate all activities.  Activities modified to address postural alignment of the shoulder girdle and trunk as well as tone mgmt with activities. See pt instruction for details.              OT Education - 09/06/18 0867    Education Details  HEP for grip strength (red putty) and coordination    Person(s) Educated  Patient    Methods  Explanation;Demonstration;Verbal cues;Handout    Comprehension  Verbalized understanding;Returned demonstration       OT Short Term Goals - 09/06/18 0858      OT SHORT TERM GOAL #1   Title  Patient will complete a home exercise program designed to improve right upper extremity range of motion due 09/25/18    Baseline  Patient has prior HEP needs updating as spasticity and joint capsule restrictions more prevalent    Time  4    Period  Weeks    Status  On-going    Target Date  09/25/18      OT SHORT TERM GOAL #2   Title  Patient will demonstrate a 5 lb increase in hand strength right    Baseline  15lb    Time  4    Period  Weeks    Status  On-going      OT SHORT TERM GOAL #3   Title  Patient will cut food using two hands with modified independence    Baseline  Patient's mom typically cuts food for patient    Time  4    Period  Weeks    Status  On-going      OT SHORT TERM GOAL  #4   Title  Patient will demonstrate 90 degrees of passive shoulder flexion, abduction while supine in right upper extremity with pain no greater than 4/10    Baseline  65 degrees of active shoulder flex/abd with 8/10 pain    Time  4    Period  Weeks    Status  On-going      OT SHORT TERM GOAL #5   Title  Patient will complete 9 hole peg test with RUE in 40 seconds or less to assist with fine motor coordination    Baseline  48.18    Time  4    Period  Weeks    Status  On-going        OT Long Term Goals - 09/06/18 7948      OT LONG TERM GOAL #1   Title  Patient will complete an update home exercise program to address active range of motion and strength throughout dominant RUE with pain no greater than 3/10    Baseline  Pain 8-9/10 - constant    Time  8    Period  Weeks    Status  On-going      OT LONG TERM GOAL #2   Title  Patient will demonstrate mid level reach to obtain lighteweight object from chest high shelf with RUE with min assist    Baseline  65 degrees shoulder flexion    Time  8    Status  On-going      OT LONG TERM GOAL #3   Title  Patient will demonstrate awareness of strategies to reduce shoulder pain at night to help with sleep    Baseline  pain disrupts sleep pattern    Time  8    Period  Weeks    Status  On-going      OT LONG TERM GOAL #4   Title  Patient will demonstrate abilty to put on / take off and demonstrate awareness of wearing schedule for right UE orthosis    Baseline  Patient does not currently have hand orthosis    Time  8    Period  Weeks  Status  On-going      OT LONG TERM GOAL #5   Title  Patient will demonstrate awareness of compensatory strategies to help him button and zipper clothing    Baseline  Patient avoids buttons, zippers at this time    Time  8    Period  Weeks    Status  On-going            Plan - 09/06/18 2707    Clinical Impression Statement  Pt progressing toward goals. Pt reports he had a fall Monday night  when he let his dog out.  Pt states his shoulder hurts more now but that it is not joint pain but rather muscle pain. Pt reported 8/10 pain in shoulder however pt also reported 8/10 pain on evaluation for R shoulder as well.  Pt does not feel like he wants to see MD for follow up to fall. Pt able to participate in all activities today in clinic with no increase in pain.      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    Occupational performance deficits (Please refer to evaluation for details):  ADL's;IADL's;Rest and Sleep;Leisure;Work    Neurosurgeon    OT Frequency  1x / week    OT Duration  8 weeks    OT Treatment/Interventions  Self-care/ADL training;Electrical Stimulation;Therapeutic exercise;Aquatic Therapy;Moist Heat;Neuromuscular education;Splinting;Patient/family education;Fluidtherapy;Therapist, nutritional;Therapeutic activities;Balance training;Ultrasound;DME and/or AE instruction;Manual Therapy;Passive range of motion    Plan  check compliance with HEP,  review sleep positions, manual/NMR for R shoulder and trunk    Consulted and Agree with Plan of Care  Patient       Patient will benefit from skilled therapeutic intervention in order to improve the following deficits and impairments:  Increased edema, Pain, Decreased coordination, Decreased mobility, Impaired sensation, Improper body mechanics, Impaired tone, Increased muscle spasms, Decreased strength, Decreased range of motion, Decreased activity tolerance, Decreased balance, Impaired UE functional use  Visit Diagnosis: Muscle weakness (generalized)  Unsteadiness on feet  Stiffness of right shoulder, not elsewhere classified  Chronic right shoulder pain  Other disturbances of skin sensation  Other lack of coordination    Problem List Patient Active Problem List   Diagnosis Date Noted   . Depression 05/29/2018  . Prostate cancer ([place]) 09/13/2017  . Hypertension 02/17/2017  . Renal carcinoma, right ([place]) 11/23/2016  . Atherosclerosis of aorta ([place]) 11/23/2016  . 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 Dysesthesia   . History of syncope   . Tetraparesis ([place])   . Central cord syndrome ([place])   . Anemia   . ETOH abuse   . Neuropathic pain   . Neck pain   . Chronic neck and back pain 09/01/2016  . Alcohol intoxication ([place]) 09/01/2016  . Tobacco abuse 09/01/2016    Quay Burow, OTR/L 09/06/2018, 9:06 AM  [facility] [address] [place], [place], [address] Phone: [phone]   Fax:  [phone]  Name: Patrick Romero MRN: [id number] Date of Birth: 08/31/1960

## 2018-09-11 ENCOUNTER — Ambulatory Visit: Payer: Medicaid Other | Admitting: Physical Therapy

## 2018-09-12 ENCOUNTER — Ambulatory Visit: Payer: Medicaid Other | Admitting: Occupational Therapy

## 2018-09-12 ENCOUNTER — Ambulatory Visit: Payer: Medicaid Other | Admitting: Physical Therapy

## 2018-09-12 ENCOUNTER — Other Ambulatory Visit: Payer: Self-pay | Admitting: Physical Medicine & Rehabilitation

## 2018-09-12 DIAGNOSIS — R202 Paresthesia of skin: Secondary | ICD-10-CM

## 2018-09-12 DIAGNOSIS — M79609 Pain in unspecified limb: Secondary | ICD-10-CM

## 2018-09-12 DIAGNOSIS — S14129S Central cord syndrome at unspecified level of cervical spinal cord, sequela: Secondary | ICD-10-CM

## 2018-09-13 ENCOUNTER — Encounter: Payer: Medicaid Other | Admitting: Occupational Therapy

## 2018-09-20 ENCOUNTER — Ambulatory Visit: Payer: Medicaid Other | Attending: Internal Medicine | Admitting: Occupational Therapy

## 2018-09-20 ENCOUNTER — Ambulatory Visit: Payer: Medicaid Other | Admitting: Physical Therapy

## 2018-09-20 DIAGNOSIS — M25611 Stiffness of right shoulder, not elsewhere classified: Secondary | ICD-10-CM | POA: Insufficient documentation

## 2018-09-20 DIAGNOSIS — M25511 Pain in right shoulder: Secondary | ICD-10-CM | POA: Insufficient documentation

## 2018-09-20 DIAGNOSIS — M6281 Muscle weakness (generalized): Secondary | ICD-10-CM | POA: Insufficient documentation

## 2018-09-20 DIAGNOSIS — R2681 Unsteadiness on feet: Secondary | ICD-10-CM | POA: Insufficient documentation

## 2018-09-20 DIAGNOSIS — R208 Other disturbances of skin sensation: Secondary | ICD-10-CM | POA: Insufficient documentation

## 2018-09-20 DIAGNOSIS — G8929 Other chronic pain: Secondary | ICD-10-CM | POA: Insufficient documentation

## 2018-09-20 DIAGNOSIS — R278 Other lack of coordination: Secondary | ICD-10-CM | POA: Insufficient documentation

## 2018-09-26 ENCOUNTER — Encounter: Payer: Self-pay | Admitting: Occupational Therapy

## 2018-09-26 ENCOUNTER — Ambulatory Visit: Payer: Medicaid Other | Admitting: Occupational Therapy

## 2018-09-26 DIAGNOSIS — R278 Other lack of coordination: Secondary | ICD-10-CM | POA: Diagnosis present

## 2018-09-26 DIAGNOSIS — M6281 Muscle weakness (generalized): Secondary | ICD-10-CM

## 2018-09-26 DIAGNOSIS — G8929 Other chronic pain: Secondary | ICD-10-CM

## 2018-09-26 DIAGNOSIS — R208 Other disturbances of skin sensation: Secondary | ICD-10-CM

## 2018-09-26 DIAGNOSIS — R2681 Unsteadiness on feet: Secondary | ICD-10-CM

## 2018-09-26 DIAGNOSIS — M25611 Stiffness of right shoulder, not elsewhere classified: Secondary | ICD-10-CM

## 2018-09-26 DIAGNOSIS — M25511 Pain in right shoulder: Secondary | ICD-10-CM | POA: Diagnosis present

## 2018-09-26 NOTE — Patient Instructions (Signed)
Home exercise program for your arm:  ALWAYS DO #1 and #2 laying down - never do it sitting up. Do these 1-2 times per day.  1. Lay on your back and hold a large ball between your hands. SLOWLY reach the ball the up toward the ceiling until your elbows are straight then lower ball back down to your chest.  DO NOT hike your shoulders - reach with your hands. Do 10, rest then do 10 more.  2. Lay on your back holding ball between your hands. Raise it up toward the ceiling until your elbows are straight. KEEPING YOUR ELBOWS STRAIGHT, slowly raise the ball over your head and then bring it back over your chest. ONLY WORK WITHIN A PAIN FREE RANGE!! Do 10, rest then do 10 more.   3. In sitting clasp hands and reach down toward your right foot. HOLD FOR A COUNT OF 5, then sit back up. Do 10.

## 2018-09-26 NOTE — Therapy (Signed)
[facility] [address] [place], [place], [address] Phone: [phone]   Fax:  [phone]  Occupational Therapy Treatment  Patient Details  Name: Patrick Romero MRN: [id number] Date of Birth: 1961/01/25 No data recorded  Encounter Date: 09/26/2018  OT End of Session - 09/26/18 1427    Visit Number  3    Number of Visits  9    Authorization Type  Medicaid - approved for 8 treatments by 10/24/2018    Authorization - Visit Number  3    Authorization - Number of Visits  9    OT Start Time  6962    OT Stop Time  1444    OT Time Calculation (min)  42 min    Activity Tolerance  Patient tolerated treatment well       Past Medical History:  Diagnosis Date  . Arthritis    ra  . Cancer [facility])    prostate   . Cancer of kidney ([place]) 2018   part of right kidney removed  . Chronic back pain    lower back burns some too  . Chronic neck pain   . Depression   . Herniated disc, cervical   . Hypertension   . Sickle cell trait ([place])   . Stroke [facility])    weakness in right hand and leg , numbness on left     Past Surgical History:  Procedure Laterality Date  . ROBOT ASSISTED LAPAROSCOPIC RADICAL PROSTATECTOMY N/A 09/13/2017   Procedure: XI ROBOTIC ASSISTED LAPAROSCOPIC RADICAL PROSTATECTOMY, BILATERAL PELVIC LYMPH NODE DISSECTION;  Surgeon: [name], MD;  Location: [facility];  Service: Urology;  Laterality: N/A;  . ROBOTIC ASSITED PARTIAL NEPHRECTOMY Right 01/04/2017   Procedure: XI ROBOTIC ASSITED PARTIAL NEPHRECTOMY;  Surgeon: [name], MD;  Location: [facility];  Service: Urology;  Laterality: Right;    There were no vitals filed for this visit.  Subjective Assessment - 09/26/18 1407    Subjective   I feel pretty good today    Pertinent History  Central cord syndrome due to fall 08/31/2016.      Patient Stated Goals  hoping to get more mobility in arm, hand and leg    Currently in Pain?  Yes    Pain Score  8     Pain Location  Shoulder    Pain Orientation  Right    Pain Descriptors / Indicators  Dull;Sharp;Throbbing    Pain Type  Chronic pain    Pain Onset  More than a month ago    Pain Frequency  Constant    Aggravating Factors   I just have pain all the time    Pain Relieving Factors  ice, heating pad                   OT Treatments/Exercises (OP) - 09/26/18 0001      ADLs   Eating  Addressed cutting food using 2 hands. Pt able to complete using serrated knife, blue foam to build up handle and non skid mat under plate.  Pt reported "Oh this is much easier I can do this by myself." Pt issued 2 pieces of blue foam for use at home with serrated steak knife.        Exercises   Exercises  Hand      Hand Exercises   Other Hand Exercises  Gripper on #2 to pick up 1 inch blocks  - pt able to complete with no drops and no  rest breaks but with increased time. Pt stated "I have to focus on my hand in order not to drop it."  Also addressed fine motor/hand and pinch strength with small pegs in red putty. Pt able to complete with increased time - mild difficulty placing pegs in peg board.       Neurological Re-education Exercises   Other Exercises 1  Neuro re ed to develop HEP for RUE - see pt instruction section for details. Pt able to return demonstrate all activities after instruction.               OT Education - 09/26/18 1655    Education Details  HEP for RUE ROM    Person(s) Educated  Patient    Methods  Explanation;Demonstration;Verbal cues;Handout    Comprehension  Verbalized understanding;Returned demonstration       OT Short Term Goals - 09/26/18 1425      OT SHORT TERM GOAL #1   Title  Patient will complete a home exercise program designed to improve right upper extremity range of motion due 09/25/18    Baseline  Patient has prior HEP needs updating as spasticity and joint capsule restrictions more prevalent    Time  4    Period  Weeks    Status  Achieved    Target  Date  09/25/18      OT SHORT TERM GOAL #2   Title  Patient will demonstrate a 5 lb increase in hand strength right    Baseline  15lb    Time  4    Period  Weeks    Status  On-going      OT SHORT TERM GOAL #3   Title  Patient will cut food using two hands with modified independence    Baseline  Patient's mom typically cuts food for patient    Time  4    Period  Weeks    Status  Achieved      OT SHORT TERM GOAL #4   Title  Patient will demonstrate 90 degrees of passive shoulder flexion, abduction while supine in right upper extremity with pain no greater than 4/10    Baseline  65 degrees of active shoulder flex/abd with 8/10 pain    Time  4    Period  Weeks    Status  Achieved      OT SHORT TERM GOAL #5   Title  Patient will complete 9 hole peg test with RUE in 40 seconds or less to assist with fine motor coordination    Baseline  48.18    Time  4    Period  Weeks    Status  On-going        OT Long Term Goals - 09/26/18 1426      OT LONG TERM GOAL #1   Title  Patient will complete an update home exercise program to address active range of motion and strength throughout dominant RUE with pain no greater than 3/10 - 10/24/2018    Baseline  Pain 8-9/10 - constant    Time  8    Period  Weeks    Status  On-going      OT LONG TERM GOAL #2   Title  Patient will demonstrate mid level reach to obtain lighteweight object from chest high shelf with RUE with min assist    Baseline  65 degrees shoulder flexion    Time  8    Status  On-going  OT LONG TERM GOAL #3   Title  Patient will demonstrate awareness of strategies to reduce shoulder pain at night to help with sleep    Baseline  pain disrupts sleep pattern    Time  8    Period  Weeks    Status  On-going      OT LONG TERM GOAL #4   Title  Patient will demonstrate abilty to put on / take off and demonstrate awareness of wearing schedule for right UE orthosis    Baseline  Patient does not currently have hand orthosis     Time  8    Period  Weeks    Status  On-going      OT LONG TERM GOAL #5   Title  Patient will demonstrate awareness of compensatory strategies to help him button and zipper clothing    Baseline  Patient avoids buttons, zippers at this time    Time  8    Period  Weeks    Status  On-going            Plan - 09/26/18 1426    Clinical Impression Statement  Pt progressing toward goals and states he is doing his putty exercise at home.      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    Occupational performance deficits (Please refer to evaluation for details):  ADL's;IADL's;Rest and Sleep;Leisure;Work    Neurosurgeon    OT Frequency  1x / week    OT Duration  8 weeks    OT Treatment/Interventions  Self-care/ADL training;Electrical Stimulation;Therapeutic exercise;Aquatic Therapy;Moist Heat;Neuromuscular education;Splinting;Patient/family education;Fluidtherapy;Therapist, nutritional;Therapeutic activities;Balance training;Ultrasound;DME and/or AE instruction;Manual Therapy;Passive range of motion    Plan  check STG's, review sleep positions, manual/NMR for R shoulder and trunk    Consulted and Agree with Plan of Care  Patient       Patient will benefit from skilled therapeutic intervention in order to improve the following deficits and impairments:     Visit Diagnosis: Muscle weakness (generalized)  Unsteadiness on feet  Stiffness of right shoulder, not elsewhere classified  Chronic right shoulder pain  Other disturbances of skin sensation  Other lack of coordination    Problem List Patient Active Problem List   Diagnosis Date Noted  . Depression 05/29/2018  . Prostate cancer ([place]) 09/13/2017  . Hypertension 02/17/2017  . Renal carcinoma, right ([place]) 11/23/2016  . Atherosclerosis of aorta ([place]) 11/23/2016  . Adhesive capsulitis of  right shoulder 10/26/2016  . Dysesthesia   . History of syncope   . Tetraparesis ([place])   . Central cord syndrome ([place])   . Anemia   . ETOH abuse   . Neuropathic pain   . Neck pain   . Chronic neck and back pain 09/01/2016  . Alcohol intoxication ([place]) 09/01/2016  . Tobacco abuse 09/01/2016    Quay Burow, OTR/L 09/26/2018, 4:56 PM  [facility] [address] [place], [place], [address] Phone: [phone]   Fax:  [phone]  Name: Patrick Romero MRN: [id number] Date of Birth: 22-Jul-1960

## 2018-09-27 ENCOUNTER — Encounter: Payer: Medicaid Other | Admitting: Occupational Therapy

## 2018-10-01 ENCOUNTER — Telehealth: Payer: Self-pay | Admitting: Occupational Therapy

## 2018-10-01 ENCOUNTER — Telehealth: Payer: Self-pay | Admitting: Internal Medicine

## 2018-10-01 NOTE — Telephone Encounter (Signed)
Will be given to pcp when she gets back

## 2018-10-01 NOTE — Telephone Encounter (Signed)
Pt came in to drop off paper work for provider to sign off Please follow up once completed

## 2018-10-01 NOTE — Telephone Encounter (Signed)
Patient was contacted today regarding the temporary closing of [facility] due to Covid-19.  Therapist ensured pt had no current questions.  Pt encouraged to continue with current HEP (OT was updated last session). Informed pt that once clinic is reopened we would resubmit his Medicaid approval to ask for a date extension (pt has already been approved for OT and PT visits). Pt verbalized understanding.   Do not feel pt would clinically benefit an e-visit, virtual check in, or telehealth visit..    [facility] will follow up with patients when we are able to resume care.  Forde Radon, OTR/L [facility] [address] [place],   [address] Phone:  [phone] Fax:  [phone] \

## 2018-10-03 ENCOUNTER — Ambulatory Visit: Payer: Medicaid Other | Admitting: Occupational Therapy

## 2018-10-03 ENCOUNTER — Ambulatory Visit: Payer: Medicaid Other | Admitting: Physical Therapy

## 2018-10-08 ENCOUNTER — Encounter: Payer: Medicaid Other | Admitting: Occupational Therapy

## 2018-10-08 ENCOUNTER — Ambulatory Visit: Payer: Medicaid Other | Admitting: Physical Therapy

## 2018-10-09 ENCOUNTER — Telehealth: Payer: Self-pay | Admitting: Internal Medicine

## 2018-10-09 ENCOUNTER — Ambulatory Visit: Payer: Medicaid Other | Admitting: Physical Therapy

## 2018-10-09 DIAGNOSIS — S14129D Central cord syndrome at unspecified level of cervical spinal cord, subsequent encounter: Secondary | ICD-10-CM

## 2018-10-09 DIAGNOSIS — G8250 Quadriplegia, unspecified: Secondary | ICD-10-CM

## 2018-10-10 ENCOUNTER — Other Ambulatory Visit: Payer: Self-pay | Admitting: Physical Medicine & Rehabilitation

## 2018-10-10 ENCOUNTER — Encounter: Payer: Medicaid Other | Admitting: Occupational Therapy

## 2018-10-10 ENCOUNTER — Ambulatory Visit: Payer: Medicaid Other | Admitting: Physical Therapy

## 2018-10-10 DIAGNOSIS — S14129S Central cord syndrome at unspecified level of cervical spinal cord, sequela: Secondary | ICD-10-CM

## 2018-10-10 NOTE — Telephone Encounter (Signed)
Contacted pt to go over Dr. Wynetta Emery message pt didn't answer lvm asking pt to give me a call at his earliest convenience

## 2018-10-10 NOTE — Telephone Encounter (Signed)
Patient called back due to missed call. Please follow up

## 2018-10-11 ENCOUNTER — Telehealth: Payer: Self-pay

## 2018-10-11 DIAGNOSIS — S14129S Central cord syndrome at unspecified level of cervical spinal cord, sequela: Secondary | ICD-10-CM

## 2018-10-11 MED ORDER — TIZANIDINE HCL 2 MG PO TABS: 2.0000 mg | ORAL_TABLET | Freq: Four times a day (QID) | ORAL | 1 refills | Status: DC

## 2018-10-11 MED ORDER — BACLOFEN 20 MG PO TABS: 20.0000 mg | ORAL_TABLET | Freq: Four times a day (QID) | ORAL | 2 refills | Status: DC

## 2018-10-11 NOTE — Telephone Encounter (Signed)
Pt called requesting refill on Baclofen and Tizanidine. Done

## 2018-10-11 NOTE — Telephone Encounter (Signed)
Returned pt call pt didn't answer lvm  

## 2018-10-11 NOTE — Telephone Encounter (Signed)
Referral submitted to neurology per pt's request.

## 2018-10-11 NOTE — Telephone Encounter (Signed)
Pt returned call. Pt states he wants the referral placed to the neurologist. Pt states he has all his records from Dr. Tessa Lerner

## 2018-10-15 ENCOUNTER — Ambulatory Visit: Payer: Medicaid Other | Admitting: Occupational Therapy

## 2018-10-15 ENCOUNTER — Ambulatory Visit: Payer: Medicaid Other | Admitting: Physical Therapy

## 2018-10-17 ENCOUNTER — Encounter: Payer: Medicaid Other | Admitting: Occupational Therapy

## 2018-10-23 ENCOUNTER — Telehealth: Payer: Self-pay | Admitting: *Deleted

## 2018-10-23 ENCOUNTER — Encounter: Payer: Self-pay | Admitting: *Deleted

## 2018-10-23 NOTE — Telephone Encounter (Signed)
Pt has returned the call to [place], please call

## 2018-10-23 NOTE — Telephone Encounter (Signed)
Called patient and updated EMR. He had no questions,  verbalized understanding, appreciation.

## 2018-10-23 NOTE — Telephone Encounter (Signed)
LVM requesting call back to update chart.

## 2018-10-24 ENCOUNTER — Encounter: Payer: Medicaid Other | Admitting: Occupational Therapy

## 2018-10-24 ENCOUNTER — Ambulatory Visit: Payer: Medicaid Other | Admitting: Physical Therapy

## 2018-10-30 ENCOUNTER — Ambulatory Visit: Payer: Medicaid Other | Admitting: Diagnostic Neuroimaging

## 2018-10-30 ENCOUNTER — Other Ambulatory Visit: Payer: Self-pay

## 2018-10-31 ENCOUNTER — Ambulatory Visit: Payer: Medicaid Other | Admitting: Occupational Therapy

## 2018-10-31 NOTE — Telephone Encounter (Signed)
Called patient. He and his sister were unable to connect with Dr Leta Baptist yesterday for video visit. We rescheduled him for next Mon. I advised he will get new e mail with new date/time. Advised he not delete Webex and 10 min before appt at 3 pm, he should go to e mail and click 'join meeting' then wait for dr to join. He  verbalized understanding, appreciation.

## 2018-11-05 ENCOUNTER — Other Ambulatory Visit: Payer: Self-pay

## 2018-11-05 ENCOUNTER — Ambulatory Visit (INDEPENDENT_AMBULATORY_CARE_PROVIDER_SITE_OTHER): Payer: Medicaid Other | Admitting: Diagnostic Neuroimaging

## 2018-11-05 ENCOUNTER — Encounter: Payer: Self-pay | Admitting: Diagnostic Neuroimaging

## 2018-11-05 DIAGNOSIS — S14129S Central cord syndrome at unspecified level of cervical spinal cord, sequela: Secondary | ICD-10-CM

## 2018-11-05 NOTE — Progress Notes (Signed)
[facility]  PATIENT: Patrick Romero DOB: Jun 21, 1961  REFERRING CLINICIAN: D Johnson HISTORY FROM: patient  REASON FOR VISIT: new consult    HISTORICAL  CHIEF COMPLAINT:  Chief Complaint  Patient presents with   spinal cord injury   Loss of Consciousness    HISTORY OF PRESENT ILLNESS:   58 year old male here for evaluation of spinal cord injury loss consciousness.  08/22/2016 patient was at home, felt lightheaded, dizzy, saw some spots and stars, had been dehydrated and passed out at home.  He had some mild shaking and jittery movements.  Patient went to the hospital for evaluation.  Patient felt better and did not want to stay for evaluation.  08/31/2016 patient was found down outside of a restaurant, and was taken to the hospital.  He was found to have low blood pressure, muscle weakness, intoxication with alcohol.  Patient was diagnosed with a cervical spinal cord injury.  He had significant weakness on his right side compared to left side.  He underwent treatment and rehabilitation.  Since that time he is continued to have problems with pain, spasticity and weakness.  He is currently under management of Dr. Primus Bravo (pain management) and his PCP (Dr. Wynetta Emery).     REVIEW OF SYSTEMS: Full 14 system review of systems performed and negative with exception of: As per HPI.  ALLERGIES: No Known Allergies  HOME MEDICATIONS: Outpatient Medications Prior to Visit  Medication Sig Dispense Refill   amitriptyline (ELAVIL) 25 MG tablet Take 1 tablet (25 mg total) by mouth at bedtime. 30 tablet 1   amLODipine (NORVASC) 5 MG tablet Take one tablet by mouth once a day. 30 tablet 2   aspirin EC 81 MG tablet Take 1 tablet (81 mg total) by mouth daily. 100 tablet 0   atorvastatin (LIPITOR) 10 MG tablet Take 1 tablet (10 mg total) by mouth daily. 30 tablet 6   baclofen (LIORESAL) 20 MG tablet Take 1 tablet (20 mg total) by mouth 4 (four) times daily. 120 tablet 2     gabapentin (NEURONTIN) 300 MG capsule TAKE 3 CAPSULES (900 MG TOTAL) BY MOUTH 3 (THREE) TIMES DAILY. 270 capsule 1   HYDROcodone-acetaminophen (NORCO/VICODIN) 5-325 MG tablet Take 1 tablet by mouth every 6 (six) hours as needed for moderate pain.     NARCAN 4 MG/0.1ML LIQD nasal spray kit As needed     nicotine (NICODERM CQ - DOSED IN MG/24 HOURS) 21 mg/24hr patch Place 1 patch (21 mg total) onto the skin daily. (Patient taking differently: Place 21 mg onto the skin 2 (two) times a week. ) 28 patch 1   tiZANidine (ZANAFLEX) 2 MG tablet Take 1 tablet (2 mg total) by mouth 4 (four) times daily. 120 tablet 1   No facility-administered medications prior to visit.     PAST MEDICAL HISTORY: Past Medical History:  Diagnosis Date   Arthritis    ra   Cancer ([place])    prostate    Cancer of kidney ([place]) 2018   part of right kidney removed   Chronic back pain    lower back burns some too   Chronic neck pain    Depression    Herniated disc, cervical    Hypertension    Sickle cell trait ([facility])    Stroke ([facility])    weakness in right hand and leg , numbness on left     PAST SURGICAL HISTORY: Past Surgical History:  Procedure Laterality Date   ROBOT ASSISTED LAPAROSCOPIC RADICAL PROSTATECTOMY  N/A 09/13/2017   Procedure: XI ROBOTIC ASSISTED LAPAROSCOPIC RADICAL PROSTATECTOMY, BILATERAL PELVIC LYMPH NODE DISSECTION;  Surgeon: [name], MD;  Location: [facility];  Service: Urology;  Laterality: N/A;   ROBOTIC ASSITED PARTIAL NEPHRECTOMY Right 01/04/2017   Procedure: XI ROBOTIC ASSITED PARTIAL NEPHRECTOMY;  Surgeon: [name], MD;  Location: [facility];  Service: Urology;  Laterality: Right;    FAMILY HISTORY: Family History  Problem Relation Age of Onset   Hypertension Mother    Lung cancer Father    COPD Sister     SOCIAL HISTORY: Social History   Socioeconomic History   Marital status: Single    Spouse name: Not on file   Number of children: Not on file    Years of education: Not on file   Highest education level: Not on file  Occupational History    Comment: disabled  Social Designer, fashion/clothing strain: Not on file   Food insecurity:    Worry: Not on file    Inability: Not on file   Transportation needs:    Medical: Not on file    Non-medical: Not on file  Tobacco Use   Smoking status: Current Every Day Smoker    Packs/day: 0.25    Types: Cigarettes   Smokeless tobacco: Never Used   Tobacco comment: pt is also using nicotine patch  Substance and Sexual Activity   Alcohol use: Yes    Comment: occ beer    Drug use: No   Sexual activity: Yes  Lifestyle   Physical activity:    Days per week: Not on file    Minutes per session: Not on file   Stress: Not on file  Relationships   Social connections:    Talks on phone: Not on file    Gets together: Not on file    Attends religious service: Not on file    Active member of club or organization: Not on file    Attends meetings of clubs or organizations: Not on file    Relationship status: Not on file   Intimate partner violence:    Fear of current or ex partner: Not on file    Emotionally abused: Not on file    Physically abused: Not on file    Forced sexual activity: Not on file  Other Topics Concern   Not on file  Social History Narrative   Lives with mother, son, sister   Caffeine- none     PHYSICAL EXAM   VIDEO EXAM  GENERAL EXAM/CONSTITUTIONAL:  Vitals: There were no vitals filed for this visit.  There is no height or weight on file to calculate BMI. Wt Readings from Last 3 Encounters:  08/23/18 161 lb 12.8 oz (73.4 kg)  07/23/18 163 lb (73.9 kg)  05/29/18 156 lb 12.8 oz (71.1 kg)     Patient is in no distress; well developed, nourished and groomed; neck is supple   NEUROLOGIC: MENTAL STATUS:  No flowsheet data found.  awake, alert, oriented to person, place and time  recent and remote memory intact  normal attention and  concentration  language fluent, comprehension intact, naming intact  fund of knowledge appropriate  CRANIAL NERVE:   2nd, 3rd, 4th, 6th - visual fields full to confrontation, extraocular muscles intact, no nystagmus  5th - facial sensation symmetric  7th - facial strength symmetric  8th - hearing intact  11th - shoulder shrug symmetric  12th - tongue protrusion midline  MOTOR:   RIGHT ARM 3; LEFT ARM  4  RIGHT LEG 3; LEFT LEFT 4  NO TREMOR; NO DRIFT IN BUE  COORDINATION:   SLOW ON RIGHT SIDE --> fine finger movements  ABLE TO STAND; CAUTIOUS GAIT     DIAGNOSTIC DATA (LABS, IMAGING, TESTING) - I reviewed patient records, labs, notes, testing and imaging myself where available.  Lab Results  Component Value Date   WBC 6.7 08/23/2018   HGB 12.0 (L) 08/23/2018   HCT 38.3 08/23/2018   MCV 82 08/23/2018   PLT 479 (H) 08/23/2018      Component Value Date/Time   NA 140 08/23/2018 1052   K 4.8 08/23/2018 1052   CL 103 08/23/2018 1052   CO2 22 08/23/2018 1052   GLUCOSE 90 08/23/2018 1052   GLUCOSE 132 (H) 09/14/2017 0438   BUN 9 08/23/2018 1052   CREATININE 1.28 (H) 08/23/2018 1052   CALCIUM 9.6 08/23/2018 1052   PROT 7.8 08/23/2018 1052   ALBUMIN 4.4 08/23/2018 1052   AST 17 08/23/2018 1052   ALT 16 08/23/2018 1052   ALKPHOS 78 08/23/2018 1052   BILITOT 0.6 08/23/2018 1052   GFRNONAA 61 08/23/2018 1052   GFRAA 71 08/23/2018 1052   Lab Results  Component Value Date   CHOL 177 08/23/2018   HDL 45 08/23/2018   LDLCALC 99 08/23/2018   TRIG 163 (H) 08/23/2018   CHOLHDL 3.9 08/23/2018   No results found for: HGBA1C No results found for: VITAMINB12 No results found for: TSH   09/01/16 MRI CERVICAL 1. 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 The cord is not expanded, making tumor less likely. The pattern is atypical for a demyelinating process. 2. Anterior corner  fractures at C3 and C7 with prevertebral edema and hemorrhage. 3. Posterior element fractures at C7 and C3 are less well appreciated on this study. 4. Multilevel spondylosis of the cervical spine as described. 5. Mild central canal stenosis without cord compromise at the C3-4 level. 6. The most significant central canal stenosis is at C5-6 without focal cord signal abnormality at this level. These results were called by telephone at the time of interpretation on 09/01/2016 at 7:00 am to Dr. Ashok Pall , who verbally acknowledged these results.    ASSESSMENT AND PLAN  58 y.o. year old male here with:  Dx:  1. Central cord syndrome, sequela [facility])     Virtual Visit via Video Note  I connected with KINSEY COWSERT on 11/05/18 at  3:00 PM EDT by a video enabled telemedicine application and verified that I am speaking with the correct person using two identifiers.   I discussed the limitations of evaluation and management by telemedicine and the availability of in person appointments. The patient expressed understanding and agreed to proceed.   I discussed the assessment and treatment plan with the patient. The patient was provided an opportunity to ask questions and all were answered. The patient agreed with the plan and demonstrated an understanding of the instructions.   The patient was advised to call back or seek an in-person evaluation if the symptoms worsen or if the condition fails to improve as anticipated.  I provided 25 minutes of non-face-to-face time during this encounter.   PLAN:  - post-traumatic central cord injury; stable - initial LOC likely related to etoh intoxication and dehydration - follow up with PCP and pain mgmt  Return for return to PCP.    Penni Bombard, MD 11/05/2018,  9:17 PM Certified in Neurology, Neurophysiology and [facility] [address], [place], [place] [address] [phone]

## 2018-11-07 ENCOUNTER — Other Ambulatory Visit: Payer: Self-pay | Admitting: Physical Medicine & Rehabilitation

## 2018-11-07 DIAGNOSIS — I10 Essential (primary) hypertension: Secondary | ICD-10-CM

## 2018-11-07 NOTE — Telephone Encounter (Signed)
Called the pharmacy to let them know to send prescription needs to go to PCP.

## 2018-11-21 ENCOUNTER — Other Ambulatory Visit: Payer: Self-pay

## 2018-11-21 ENCOUNTER — Encounter: Payer: Self-pay | Admitting: Physical Medicine & Rehabilitation

## 2018-11-21 ENCOUNTER — Encounter: Payer: Medicaid Other | Attending: Physical Medicine & Rehabilitation | Admitting: Physical Medicine & Rehabilitation

## 2018-11-21 VITALS — BP 102/60 | Ht 72.0 in | Wt 162.0 lb

## 2018-11-21 DIAGNOSIS — M7501 Adhesive capsulitis of right shoulder: Secondary | ICD-10-CM

## 2018-11-21 DIAGNOSIS — M79609 Pain in unspecified limb: Secondary | ICD-10-CM

## 2018-11-21 DIAGNOSIS — G894 Chronic pain syndrome: Secondary | ICD-10-CM

## 2018-11-21 DIAGNOSIS — S14129S Central cord syndrome at unspecified level of cervical spinal cord, sequela: Secondary | ICD-10-CM | POA: Diagnosis not present

## 2018-11-21 DIAGNOSIS — R202 Paresthesia of skin: Secondary | ICD-10-CM | POA: Diagnosis not present

## 2018-11-21 MED ORDER — DICLOFENAC SODIUM 50 MG PO TBEC: 50.0000 mg | DELAYED_RELEASE_TABLET | Freq: Two times a day (BID) | ORAL | 3 refills | Status: DC

## 2018-11-21 MED ORDER — TIZANIDINE HCL 2 MG PO TABS: 2.0000 mg | ORAL_TABLET | Freq: Four times a day (QID) | ORAL | 4 refills | Status: DC

## 2018-11-21 MED ORDER — GABAPENTIN 300 MG PO CAPS
900.0000 mg | ORAL_CAPSULE | Freq: Three times a day (TID) | ORAL | 4 refills | Status: DC
Start: 2018-11-21 — End: 2019-05-15

## 2018-11-21 MED ORDER — BACLOFEN 20 MG PO TABS: 20.0000 mg | ORAL_TABLET | Freq: Four times a day (QID) | ORAL | 4 refills | Status: DC

## 2018-11-21 NOTE — Progress Notes (Signed)
Subjective:    Patient ID: Patrick Romero, male    DOB: [birth year], 59 y.o.   MRN: [id number]  HPI   Due to national recommendations of social distancing because of COVID 9, an audio/video tele-health visit is felt to be the most appropriate encounter for this patient at this time. See MyChart message from today for the patient's consent to a tele-health encounter with [place]. This is a follow up telephone visit for the patient who is at home. MD is at office.     I am meeting with the patient today regarding cervical central cord syndrome and associated deficits. He was able to participate in therapy briefly which was truncated by covid. He is working daily stretches at home as well as strengthening exercises. He feels that his ROM has improved somewhat but nothing dramatically although he has seen some differences in strength. He seems to be using his right hand more for self-care. He feels that baclofen and tizanidine help with his pain and spasms. Gabapentin seems to decrease his neuropathic pain.   Pain Inventory Average Pain 8 Pain Right Now 8 My pain is constant, dull, tingling and aching  In the last 24 hours, has pain interfered with the following? General activity 8 Relation with others 8 Enjoyment of life 8 What TIME of day is your pain at its worst? all Sleep (in general) Fair  Pain is worse with: walking, bending, standing and some activites Pain improves with: rest, heat/ice and medication Relief from Meds: 5  Mobility walk with assistance use a cane use a walker ability to climb steps?  yes do you drive?  no  Function disabled: date disabled 2018  Neuro/Psych weakness numbness tremor tingling trouble walking spasms dizziness  Prior Studies Any changes since last visit?  no  Physicians involved in your care Any changes since last visit?  no   Family History  Problem Relation Age of Onset  . Hypertension  Mother   . Lung cancer Father   . COPD Sister    Social History   Socioeconomic History  . Marital status: Single    Spouse name: Not on file  . Number of children: Not on file  . Years of education: Not on file  . Highest education level: Not on file  Occupational History    Comment: disabled  Social Needs  . Financial resource strain: Not on file  . Food insecurity:    Worry: Not on file    Inability: Not on file  . Transportation needs:    Medical: Not on file    Non-medical: Not on file  Tobacco Use  . Smoking status: Current Every Day Smoker    Packs/day: 0.25    Types: Cigarettes  . Smokeless tobacco: Never Used  . Tobacco comment: pt is also using nicotine patch  Substance and Sexual Activity  . Alcohol use: Yes    Comment: occ beer   . Drug use: No  . Sexual activity: Yes  Lifestyle  . Physical activity:    Days per week: Not on file    Minutes per session: Not on file  . Stress: Not on file  Relationships  . Social connections:    Talks on phone: Not on file    Gets together: Not on file    Attends religious service: Not on file    Active member of club or organization: Not on file    Attends meetings of clubs or organizations:  Not on file    Relationship status: Not on file  Other Topics Concern  . Not on file  Social History Narrative   Lives with mother, son, sister   Caffeine- none   Past Surgical History:  Procedure Laterality Date  . ROBOT ASSISTED LAPAROSCOPIC RADICAL PROSTATECTOMY N/A 09/13/2017   Procedure: XI ROBOTIC ASSISTED LAPAROSCOPIC RADICAL PROSTATECTOMY, BILATERAL PELVIC LYMPH NODE DISSECTION;  Surgeon: [name], MD;  Location: [facility];  Service: Urology;  Laterality: N/A;  . ROBOTIC ASSITED PARTIAL NEPHRECTOMY Right 01/04/2017   Procedure: XI ROBOTIC ASSITED PARTIAL NEPHRECTOMY;  Surgeon: [name], MD;  Location: [facility];  Service: Urology;  Laterality: Right;   Past Medical History:  Diagnosis Date  . Arthritis    ra   . Cancer [facility])    prostate   . Cancer of kidney ([place]) 2018   part of right kidney removed  . Chronic back pain    lower back burns some too  . Chronic neck pain   . Depression   . Herniated disc, cervical   . Hypertension   . Sickle cell trait ([place])   . Stroke [facility])    weakness in right hand and leg , numbness on left    BP 102/60   Ht 6' (1.829 m)   Wt 162 lb (73.5 kg)   BMI 21.97 kg/m   Opioid Risk Score:   Fall Risk Score:  `1  Depression screen PHQ 2/9  Depression screen [facility] 2/9 08/23/2018 07/23/2018 05/29/2018 04/05/2018 02/05/2018 01/26/2018 10/31/2017  Decreased Interest 0 0 2 0 1 2 0  Down, Depressed, Hopeless 0 0 1 0 1 2 0  PHQ - 2 Score 0 0 3 0 2 4 0  Altered sleeping - - 3 - - 2 0  Tired, decreased energy - - 2 - - 1 0  Change in appetite - - 1 - - (No Data) 0  Feeling bad or failure about yourself  - - 1 - - 0 0  Trouble concentrating - - 2 - - 2 0  Moving slowly or fidgety/restless - - 2 - - 2 0  Suicidal thoughts - - 0 - - 0 0  PHQ-9 Score - - 14 - - 11 0  Some recent data might be hidden     Review of Systems  Constitutional: Negative.   HENT: Negative.   Eyes: Negative.   Respiratory: Negative.   Cardiovascular: Negative.   Gastrointestinal: Negative.   Endocrine: Negative.   Genitourinary: Negative.   Musculoskeletal: Positive for arthralgias, back pain, gait problem and myalgias.  Skin: Negative.   Allergic/Immunologic: Negative.   Neurological: Positive for dizziness, tremors, weakness and numbness.  Hematological: Negative.   Psychiatric/Behavioral: Negative.   All other systems reviewed and are negative.           Assessment & Plan:  1. 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 --continue HEP. He is working  2.  Pain Management:  -continueelavil25mg  qhs -gabapentin at 900mg  tid  RF        -added diclofenac 3. Spasticity: Appears generally controlled  maintainbaclofen at20mg  QID--RF -resume therapy when possible for range of motion exercises - zanaflex   2mg  qid==RF 4. Anemia:follow up per primary  5. Right renal carcinoma s/p partial nephrectomy right (01/04/17), prostate cancer  -follow up with urology/oncology 6. . ETOH abuse/Tobacco: continue etoh abstinence 7. Right shoulder pain/capsulitis:  continue HEP   -resume OT when able   -consider steroid injection at next visit   -  diclofenac 50mg  bid 12 minutes of tele-visit time was spent with this patient today. Follow up in 2 months

## 2018-11-26 ENCOUNTER — Ambulatory Visit: Payer: Medicaid Other | Attending: Internal Medicine | Admitting: Internal Medicine

## 2018-11-26 ENCOUNTER — Other Ambulatory Visit: Payer: Self-pay

## 2018-11-26 ENCOUNTER — Encounter: Payer: Self-pay | Admitting: Internal Medicine

## 2018-11-26 DIAGNOSIS — D649 Anemia, unspecified: Secondary | ICD-10-CM | POA: Insufficient documentation

## 2018-11-26 DIAGNOSIS — Z9079 Acquired absence of other genital organ(s): Secondary | ICD-10-CM | POA: Diagnosis not present

## 2018-11-26 DIAGNOSIS — E7800 Pure hypercholesterolemia, unspecified: Secondary | ICD-10-CM | POA: Insufficient documentation

## 2018-11-26 DIAGNOSIS — W19XXXD Unspecified fall, subsequent encounter: Secondary | ICD-10-CM | POA: Diagnosis not present

## 2018-11-26 DIAGNOSIS — Z8546 Personal history of malignant neoplasm of prostate: Secondary | ICD-10-CM | POA: Insufficient documentation

## 2018-11-26 DIAGNOSIS — Z7982 Long term (current) use of aspirin: Secondary | ICD-10-CM | POA: Insufficient documentation

## 2018-11-26 DIAGNOSIS — M549 Dorsalgia, unspecified: Secondary | ICD-10-CM | POA: Insufficient documentation

## 2018-11-26 DIAGNOSIS — I10 Essential (primary) hypertension: Secondary | ICD-10-CM | POA: Diagnosis not present

## 2018-11-26 DIAGNOSIS — G8929 Other chronic pain: Secondary | ICD-10-CM | POA: Diagnosis not present

## 2018-11-26 DIAGNOSIS — M542 Cervicalgia: Secondary | ICD-10-CM | POA: Diagnosis not present

## 2018-11-26 DIAGNOSIS — S14129D Central cord syndrome at unspecified level of cervical spinal cord, subsequent encounter: Secondary | ICD-10-CM | POA: Diagnosis not present

## 2018-11-26 DIAGNOSIS — Z85528 Personal history of other malignant neoplasm of kidney: Secondary | ICD-10-CM | POA: Insufficient documentation

## 2018-11-26 DIAGNOSIS — F17210 Nicotine dependence, cigarettes, uncomplicated: Secondary | ICD-10-CM | POA: Insufficient documentation

## 2018-11-26 DIAGNOSIS — G8250 Quadriplegia, unspecified: Secondary | ICD-10-CM | POA: Insufficient documentation

## 2018-11-26 DIAGNOSIS — F17200 Nicotine dependence, unspecified, uncomplicated: Secondary | ICD-10-CM

## 2018-11-26 DIAGNOSIS — Z791 Long term (current) use of non-steroidal anti-inflammatories (NSAID): Secondary | ICD-10-CM | POA: Insufficient documentation

## 2018-11-26 DIAGNOSIS — Z905 Acquired absence of kidney: Secondary | ICD-10-CM | POA: Insufficient documentation

## 2018-11-26 DIAGNOSIS — Z79899 Other long term (current) drug therapy: Secondary | ICD-10-CM | POA: Insufficient documentation

## 2018-11-26 MED ORDER — AMLODIPINE BESYLATE 5 MG PO TABS: ORAL_TABLET | ORAL | 12 refills | Status: DC

## 2018-11-26 NOTE — Progress Notes (Signed)
Pt states his pain is coming from his right side   Pt states he is having pain in lower back

## 2018-11-26 NOTE — Progress Notes (Signed)
Virtual Visit via Telephone Note Due to current restrictions/limitations of in-office visits due to the COVID-19 pandemic, this scheduled clinical appointment was converted to a telehealth visit  I connected with Patrick Romero on 11/26/18 at 9:16 a.m by telephone and verified that I am speaking with the correct person using two identifiers. I am in my office.  The patient is at home.  Only the patient and myself participated in this encounter.  I discussed the limitations, risks, security and privacy concerns of performing an evaluation and management service by telephone and the availability of in person appointments. I also discussed with the patient that there may be a patient responsible charge related to this service. The patient expressed understanding and agreed to proceed.   History of Present Illness: Patient with history of HTN, tobacco dependence, renal carcinoma status post partial nephrectomy 12/2016, prostate CA status post prostatectomy with LN dissection, central cord syndrome(08/2016 cervical injury from fall) andincomplete quadriparesis (followed by PMR), anemia, ETOH.  Last seen 08/23/2018.   HL:  Lipid not at goal on last blood test.  Lipitor started as prescribed.  Pt taking and tolerating.  Anemia:  Improved on last CBC.  Hb from 10 to 12.  HTN:  Checks BP 2-3 times a wk but has not checked in last wk.  Gives range of SBP of 100. Does not recall the DBP -compliant with Norvasc Limits salt in foods No CP/SOB/LE/chronic HA  Tob dep:  Still smoking.  At 1/2 pk a day.  He has set a quit date for July 4th of this yr.  Plans to continue using lozenges.  "I don't know why I continue to smoke but I know I need to quit."  Can hear some wheezing at nights on his chest.  No associated shortness of breath or cough.  Central cord syndrome: He continues to see Dr. Naaman Plummer.  He is also now seeing a pain specialist Dr. Bethena Roys.  Currently on hydrocodone 3-4 times a day.  He was also  prescribed a Narcan kit. - no recent falls  Outpatient Encounter Medications as of 11/26/2018  Medication Sig Note  . amitriptyline (ELAVIL) 25 MG tablet Take 1 tablet (25 mg total) by mouth at bedtime.   Marland Kitchen amLODipine (NORVASC) 5 MG tablet Take one tablet by mouth once a day.   Marland Kitchen aspirin EC 81 MG tablet Take 1 tablet (81 mg total) by mouth daily.   Marland Kitchen atorvastatin (LIPITOR) 10 MG tablet Take 1 tablet (10 mg total) by mouth daily.   . baclofen (LIORESAL) 20 MG tablet Take 1 tablet (20 mg total) by mouth 4 (four) times daily.   . diclofenac (VOLTAREN) 50 MG EC tablet Take 1 tablet (50 mg total) by mouth 2 (two) times daily with a meal.   . gabapentin (NEURONTIN) 300 MG capsule Take 3 capsules (900 mg total) by mouth 3 (three) times daily.   Marland Kitchen HYDROcodone-acetaminophen (NORCO/VICODIN) 5-325 MG tablet Take 1 tablet by mouth every 6 (six) hours as needed for moderate pain. 10/23/2018: 10/23/18 limit 1 tab 2-3 x day if tolerated  . NARCAN 4 MG/0.1ML LIQD nasal spray kit As needed   . nicotine (NICODERM CQ - DOSED IN MG/24 HOURS) 21 mg/24hr patch Place 1 patch (21 mg total) onto the skin daily. (Patient taking differently: Place 21 mg onto the skin 2 (two) times a week. )   . tiZANidine (ZANAFLEX) 2 MG tablet Take 1 tablet (2 mg total) by mouth 4 (four) times daily.  No facility-administered encounter medications on file as of 11/26/2018.    Social History   Socioeconomic History  . Marital status: Single    Spouse name: Not on file  . Number of children: Not on file  . Years of education: Not on file  . Highest education level: Not on file  Occupational History    Comment: disabled  Social Needs  . Financial resource strain: Not on file  . Food insecurity:    Worry: Not on file    Inability: Not on file  . Transportation needs:    Medical: Not on file    Non-medical: Not on file  Tobacco Use  . Smoking status: Current Every Day Smoker    Packs/day: 0.25    Types: Cigarettes  .  Smokeless tobacco: Never Used  . Tobacco comment: pt is also using nicotine patch  Substance and Sexual Activity  . Alcohol use: Yes    Comment: occ beer   . Drug use: No  . Sexual activity: Yes  Lifestyle  . Physical activity:    Days per week: Not on file    Minutes per session: Not on file  . Stress: Not on file  Relationships  . Social connections:    Talks on phone: Not on file    Gets together: Not on file    Attends religious service: Not on file    Active member of club or organization: Not on file    Attends meetings of clubs or organizations: Not on file    Relationship status: Not on file  Other Topics Concern  . Not on file  Social History Narrative   Lives with mother, son, sister   Caffeine- none      Observations/Objective: Depression screen [facility] 2/9 08/23/2018 07/23/2018 05/29/2018  Decreased Interest 0 0 2  Down, Depressed, Hopeless 0 0 1  PHQ - 2 Score 0 0 3  Altered sleeping - - 3  Tired, decreased energy - - 2  Change in appetite - - 1  Feeling bad or failure about yourself  - - 1  Trouble concentrating - - 2  Moving slowly or fidgety/restless - - 2  Suicidal thoughts - - 0  PHQ-9 Score - - 14  Some recent data might be hidden   The 10-year ASCVD risk score Mikey Bussing DC Jr., et al., 2013) is: 13.7%   Values used to calculate the score:     Age: 58 years     Sex: Male     Is Non-Hispanic African American: Yes     Diabetic: No     Tobacco smoker: Yes     Systolic Blood Pressure: 408 mmHg     Is BP treated: Yes     HDL Cholesterol: 45 mg/dL     Total Cholesterol: 177 mg/dL  Results for orders placed or performed in visit on 08/23/18  CBC  Result Value Ref Range   WBC 6.7 3.4 - 10.8 x10E3/uL   RBC 4.68 4.14 - 5.80 x10E6/uL   Hemoglobin 12.0 (L) 13.0 - 17.7 g/dL   Hematocrit 38.3 37.5 - 51.0 %   MCV 82 79 - 97 fL   MCH 25.6 (L) 26.6 - 33.0 pg   MCHC 31.3 (L) 31.5 - 35.7 g/dL   RDW 13.9 11.6 - 15.4 %   Platelets 479 (H) 150 - 450 x10E3/uL   Comprehensive metabolic panel  Result Value Ref Range   Glucose 90 65 - 99 mg/dL   BUN 9 6 -  24 mg/dL   Creatinine, Ser 1.28 (H) 0.76 - 1.27 mg/dL   GFR calc non Af Amer 61 >59 mL/min/1.73   GFR calc Af Amer 71 >59 mL/min/1.73   BUN/Creatinine Ratio 7 (L) 9 - 20   Sodium 140 134 - 144 mmol/L   Potassium 4.8 3.5 - 5.2 mmol/L   Chloride 103 96 - 106 mmol/L   CO2 22 20 - 29 mmol/L   Calcium 9.6 8.7 - 10.2 mg/dL   Total Protein 7.8 6.0 - 8.5 g/dL   Albumin 4.4 3.8 - 4.9 g/dL   Globulin, Total 3.4 1.5 - 4.5 g/dL   Albumin/Globulin Ratio 1.3 1.2 - 2.2   Bilirubin Total 0.6 0.0 - 1.2 mg/dL   Alkaline Phosphatase 78 39 - 117 IU/L   AST 17 0 - 40 IU/L   ALT 16 0 - 44 IU/L  Lipid panel  Result Value Ref Range   Cholesterol, Total 177 100 - 199 mg/dL   Triglycerides 163 (H) 0 - 149 mg/dL   HDL 45 >39 mg/dL   VLDL Cholesterol Cal 33 5 - 40 mg/dL   LDL Calculated 99 0 - 99 mg/dL   Chol/HDL Ratio 3.9 0.0 - 5.0 ratio    Assessment and Plan: 1. Essential hypertension Advised patient to write down his blood pressure readings.  However his reported systolic blood pressure readings are good.  Goal is 130/80 or lower. - amLODipine (NORVASC) 5 MG tablet; Take one tablet by mouth once a day.  Dispense: 30 tablet; Refill: 12  2. Tobacco dependence Continue to encourage him to quit.  He is now set a quit date.  We discussed what his plans are leading up to that date.  Patient states that he plans to start using the nicotine lozenges more consistently Less than 5 minutes spent on counseling. 3. Pure hypercholesterolemia Continue Lipitor which she is tolerating  4. Central cord syndrome, subsequent encounter ([place]) Followed by PMR  5. Chronic neck and back pain Currently followed by pain specialist Dr. crisp  6. Anemia, unspecified type Improved and asymptomatic   Follow Up Instructions: F/u in 3 mths   I discussed the assessment and treatment plan with the patient. The patient was  provided an opportunity to ask questions and all were answered. The patient agreed with the plan and demonstrated an understanding of the instructions.   The patient was advised to call back or seek an in-person evaluation if the symptoms worsen or if the condition fails to improve as anticipated.  I provided 12 minutes of non-face-to-face time during this encounter.   Karle Plumber, MD

## 2019-01-22 ENCOUNTER — Encounter: Payer: Medicaid Other | Attending: Physical Medicine & Rehabilitation | Admitting: Physical Medicine & Rehabilitation

## 2019-03-01 ENCOUNTER — Other Ambulatory Visit: Payer: Self-pay

## 2019-03-01 ENCOUNTER — Ambulatory Visit: Payer: Medicare Other | Attending: Internal Medicine | Admitting: Internal Medicine

## 2019-03-01 ENCOUNTER — Encounter: Payer: Self-pay | Admitting: Internal Medicine

## 2019-03-01 DIAGNOSIS — D649 Anemia, unspecified: Secondary | ICD-10-CM | POA: Diagnosis not present

## 2019-03-01 DIAGNOSIS — I10 Essential (primary) hypertension: Secondary | ICD-10-CM | POA: Diagnosis present

## 2019-03-01 DIAGNOSIS — F17200 Nicotine dependence, unspecified, uncomplicated: Secondary | ICD-10-CM | POA: Insufficient documentation

## 2019-03-01 DIAGNOSIS — G8250 Quadriplegia, unspecified: Secondary | ICD-10-CM | POA: Insufficient documentation

## 2019-03-01 DIAGNOSIS — F17210 Nicotine dependence, cigarettes, uncomplicated: Secondary | ICD-10-CM

## 2019-03-01 DIAGNOSIS — Z7982 Long term (current) use of aspirin: Secondary | ICD-10-CM | POA: Diagnosis not present

## 2019-03-01 DIAGNOSIS — Z79899 Other long term (current) drug therapy: Secondary | ICD-10-CM | POA: Diagnosis not present

## 2019-03-01 DIAGNOSIS — S14129D Central cord syndrome at unspecified level of cervical spinal cord, subsequent encounter: Secondary | ICD-10-CM

## 2019-03-01 DIAGNOSIS — Z23 Encounter for immunization: Secondary | ICD-10-CM

## 2019-03-01 DIAGNOSIS — E7800 Pure hypercholesterolemia, unspecified: Secondary | ICD-10-CM | POA: Insufficient documentation

## 2019-03-01 DIAGNOSIS — Z791 Long term (current) use of non-steroidal anti-inflammatories (NSAID): Secondary | ICD-10-CM | POA: Insufficient documentation

## 2019-03-01 NOTE — Progress Notes (Signed)
Virtual Visit via Telephone Note Due to current restrictions/limitations of in-office visits due to the COVID-19 pandemic, this scheduled clinical appointment was converted to a telehealth visit  I connected with Patrick Romero on 03/01/19 at 8:49 a.m by telephone and verified that I am speaking with the correct person using two identifiers. I am in my office.  The patient is at home.  Only the patient and myself participated in this encounter.  I discussed the limitations, risks, security and privacy concerns of performing an evaluation and management service by telephone and the availability of in person appointments. I also discussed with the patient that there may be a patient responsible charge related to this service. The patient expressed understanding and agreed to proceed.   History of Present Illness: Patient with history of HTN, tobacco dependence, renal carcinoma status post partial nephrectomy 12/2016, prostate CA status post prostatectomy with LN dissection, central cord syndrome(08/2016 cervical injury from fall) andincomplete quadriparesis (followed by PMR), anemia, ETOH.  Last eval 11/2018  HYPERTENSION Currently taking: see medication list Med Adherence: _0  Yes    _1  No Medication side effects: _2  Yes    _3  No Adherence with salt restriction: _4  Yes    _5  No Home Monitoring?: _6  Yes    _7  No Monitoring Frequency: 3 x a wk Home BP results range:  Last ready 120s/80 SOB? _8  Yes    _9  No Chest Pain?: _10  Yes    _11  No Leg swelling?: _12  Yes    _13  No Headaches?: _14  Yes    _15  No Dizziness? _16  Yes    _17  No Comments:   Tob dep:  Did not achieve goal to quit 01/12/2019.  Using nicotine patches and lozenges intermittently.  Down to 1/3 pk/day  HL:  Compliant with and tolerating Lipitor  Central Cord Syndrome:  Next appt with Dr. Naaman Plummer is in Sept  HM:  Due for flu shot   Outpatient Encounter Medications as of 03/01/2019  Medication Sig Note  . amitriptyline (ELAVIL)  25 MG tablet Take 1 tablet (25 mg total) by mouth at bedtime.   Marland Kitchen amLODipine (NORVASC) 5 MG tablet Take one tablet by mouth once a day.   Marland Kitchen atorvastatin (LIPITOR) 10 MG tablet Take 1 tablet (10 mg total) by mouth daily.   . baclofen (LIORESAL) 20 MG tablet Take 1 tablet (20 mg total) by mouth 4 (four) times daily.   . diclofenac (VOLTAREN) 50 MG EC tablet Take 1 tablet (50 mg total) by mouth 2 (two) times daily with a meal.   . gabapentin (NEURONTIN) 300 MG capsule Take 3 capsules (900 mg total) by mouth 3 (three) times daily.   Marland Kitchen tiZANidine (ZANAFLEX) 2 MG tablet Take 1 tablet (2 mg total) by mouth 4 (four) times daily.   Marland Kitchen aspirin EC 81 MG tablet Take 1 tablet (81 mg total) by mouth daily. (Patient not taking: Reported on 03/01/2019)   . NARCAN 4 MG/0.1ML LIQD nasal spray kit As needed   . nicotine (NICODERM CQ - DOSED IN MG/24 HOURS) 21 mg/24hr patch Place 1 patch (21 mg total) onto the skin daily. (Patient taking differently: Place 21 mg onto the skin 2 (two) times a week. )   . [DISCONTINUED] HYDROcodone-acetaminophen (NORCO/VICODIN) 5-325 MG tablet Take 1 tablet by mouth every 6 (six) hours as needed for moderate pain. 10/23/2018: 10/23/18 limit 1 tab 2-3 x day if tolerated   No facility-administered encounter medications on file as of 03/01/2019.     Observations/Objective: Results for  orders placed or performed in visit on 08/23/18  CBC  Result Value Ref Range   WBC 6.7 3.4 - 10.8 x10E3/uL   RBC 4.68 4.14 - 5.80 x10E6/uL   Hemoglobin 12.0 (L) 13.0 - 17.7 g/dL   Hematocrit 38.3 37.5 - 51.0 %   MCV 82 79 - 97 fL   MCH 25.6 (L) 26.6 - 33.0 pg   MCHC 31.3 (L) 31.5 - 35.7 g/dL   RDW 13.9 11.6 - 15.4 %   Platelets 479 (H) 150 - 450 x10E3/uL  Comprehensive metabolic panel  Result Value Ref Range   Glucose 90 65 - 99 mg/dL   BUN 9 6 - 24 mg/dL   Creatinine, Ser 1.28 (H) 0.76 - 1.27 mg/dL   GFR calc non Af Amer 61 >59 mL/min/1.73   GFR calc Af Amer 71 >59 mL/min/1.73   BUN/Creatinine  Ratio 7 (L) 9 - 20   Sodium 140 134 - 144 mmol/L   Potassium 4.8 3.5 - 5.2 mmol/L   Chloride 103 96 - 106 mmol/L   CO2 22 20 - 29 mmol/L   Calcium 9.6 8.7 - 10.2 mg/dL   Total Protein 7.8 6.0 - 8.5 g/dL   Albumin 4.4 3.8 - 4.9 g/dL   Globulin, Total 3.4 1.5 - 4.5 g/dL   Albumin/Globulin Ratio 1.3 1.2 - 2.2   Bilirubin Total 0.6 0.0 - 1.2 mg/dL   Alkaline Phosphatase 78 39 - 117 IU/L   AST 17 0 - 40 IU/L   ALT 16 0 - 44 IU/L  Lipid panel  Result Value Ref Range   Cholesterol, Total 177 100 - 199 mg/dL   Triglycerides 163 (H) 0 - 149 mg/dL   HDL 45 >39 mg/dL   VLDL Cholesterol Cal 33 5 - 40 mg/dL   LDL Calculated 99 0 - 99 mg/dL   Chol/HDL Ratio 3.9 0.0 - 5.0 ratio     Assessment and Plan: 1. Essential hypertension Reported blood pressure readings at home are at goal. Continue amlodipine and low-salt diet.  2. Tobacco dependence Commended him on cutting back further even though he did not meet his goal of quitting for July 4.  Encouraged him to set a new quit date and to use NRT more consistently Less than 5 minutes spent on counseling.  3. Pure hypercholesterolemia Continue Lipitor  4. Central cord syndrome, subsequent encounter ([place]) Followed by PMR and pain specialist  5. Need for influenza vaccination Advised patient to be sure and get the flu shot.  Offered him to come as a nurse only visit to have it done versus getting it at 1 of his local pharmacies.  He states it is more convenient for him to get it at [facility] pharmacy   Follow Up Instructions: 3 mths   I discussed the assessment and treatment plan with the patient. The patient was provided an opportunity to ask questions and all were answered. The patient agreed with the plan and demonstrated an understanding of the instructions.   The patient was advised to call back or seek an in-person evaluation if the symptoms worsen or if the condition fails to improve as anticipated.  I provided 6 minutes of  non-face-to-face time during this encounter.   Karle Plumber, MD

## 2019-03-01 NOTE — Progress Notes (Signed)
Follow up.

## 2019-03-12 ENCOUNTER — Other Ambulatory Visit: Payer: Self-pay | Admitting: Physical Medicine & Rehabilitation

## 2019-03-12 DIAGNOSIS — S14129S Central cord syndrome at unspecified level of cervical spinal cord, sequela: Secondary | ICD-10-CM

## 2019-03-27 ENCOUNTER — Encounter: Payer: Self-pay | Admitting: Physical Medicine & Rehabilitation

## 2019-03-27 ENCOUNTER — Encounter: Payer: Medicare Other | Attending: Physical Medicine & Rehabilitation | Admitting: Physical Medicine & Rehabilitation

## 2019-03-27 ENCOUNTER — Other Ambulatory Visit: Payer: Self-pay

## 2019-03-27 VITALS — BP 119/84 | HR 83 | Temp 97.5°F | Ht 72.0 in | Wt 160.0 lb

## 2019-03-27 DIAGNOSIS — R252 Cramp and spasm: Secondary | ICD-10-CM

## 2019-03-27 DIAGNOSIS — F17210 Nicotine dependence, cigarettes, uncomplicated: Secondary | ICD-10-CM | POA: Diagnosis not present

## 2019-03-27 DIAGNOSIS — S14129S Central cord syndrome at unspecified level of cervical spinal cord, sequela: Secondary | ICD-10-CM | POA: Diagnosis not present

## 2019-03-27 DIAGNOSIS — D573 Sickle-cell trait: Secondary | ICD-10-CM | POA: Diagnosis not present

## 2019-03-27 DIAGNOSIS — C641 Malignant neoplasm of right kidney, except renal pelvis: Secondary | ICD-10-CM | POA: Insufficient documentation

## 2019-03-27 DIAGNOSIS — Z801 Family history of malignant neoplasm of trachea, bronchus and lung: Secondary | ICD-10-CM | POA: Insufficient documentation

## 2019-03-27 DIAGNOSIS — M25511 Pain in right shoulder: Secondary | ICD-10-CM | POA: Diagnosis not present

## 2019-03-27 DIAGNOSIS — G8252 Quadriplegia, C1-C4 incomplete: Secondary | ICD-10-CM | POA: Diagnosis present

## 2019-03-27 DIAGNOSIS — M779 Enthesopathy, unspecified: Secondary | ICD-10-CM | POA: Diagnosis not present

## 2019-03-27 DIAGNOSIS — I10 Essential (primary) hypertension: Secondary | ICD-10-CM | POA: Diagnosis not present

## 2019-03-27 DIAGNOSIS — Z905 Acquired absence of kidney: Secondary | ICD-10-CM | POA: Diagnosis not present

## 2019-03-27 DIAGNOSIS — Z8673 Personal history of transient ischemic attack (TIA), and cerebral infarction without residual deficits: Secondary | ICD-10-CM | POA: Insufficient documentation

## 2019-03-27 DIAGNOSIS — M792 Neuralgia and neuritis, unspecified: Secondary | ICD-10-CM | POA: Diagnosis not present

## 2019-03-27 DIAGNOSIS — Z8249 Family history of ischemic heart disease and other diseases of the circulatory system: Secondary | ICD-10-CM | POA: Insufficient documentation

## 2019-03-27 DIAGNOSIS — C61 Malignant neoplasm of prostate: Secondary | ICD-10-CM | POA: Diagnosis not present

## 2019-03-27 DIAGNOSIS — M1990 Unspecified osteoarthritis, unspecified site: Secondary | ICD-10-CM | POA: Diagnosis not present

## 2019-03-27 DIAGNOSIS — G8250 Quadriplegia, unspecified: Secondary | ICD-10-CM

## 2019-03-27 DIAGNOSIS — Z9079 Acquired absence of other genital organ(s): Secondary | ICD-10-CM | POA: Diagnosis not present

## 2019-03-27 DIAGNOSIS — F1010 Alcohol abuse, uncomplicated: Secondary | ICD-10-CM | POA: Diagnosis not present

## 2019-03-27 NOTE — Progress Notes (Signed)
Subjective:    Patient ID: Patrick Romero, male    DOB: 11/21/1960, 58 y.o.   MRN: [id number]  HPI   Patrick Romero is here in follow up of his cervical spinal cord injury. I last chatted with him in May (televisit). He has noticed increased spasms in his right leg>arm. They happen mostly at night. When the spasms come on, he rests and waits until they stopped. He reports his right elbow and hand are tight in flexion. He remains on baclofen and tizanidine to help with control, and they continue to benefit him. He maintains his HEP. He uses a cane for balance.   He uses voltaren, gabpentin, and elavil for pain control.   Pain Inventory Average Pain 8 Pain Right Now 8 My pain is sharp, burning and tingling  In the last 24 hours, has pain interfered with the following? General activity 0 Relation with others 0 Enjoyment of life 0 What TIME of day is your pain at its worst? na Sleep (in general) Fair  Pain is worse with: some activites Pain improves with: na Relief from Meds: na  Mobility walk with assistance use a cane ability to climb steps?  yes do you drive?  no  Function not employed: date last employed .  Neuro/Psych No problems in this area  Prior Studies Any changes since last visit?  no  Physicians involved in your care Any changes since last visit?  no   Family History  Problem Relation Age of Onset  . Hypertension Mother   . Lung cancer Father   . COPD Sister    Social History   Socioeconomic History  . Marital status: Single    Spouse name: Not on file  . Number of children: Not on file  . Years of education: Not on file  . Highest education level: Not on file  Occupational History    Comment: disabled  Social Needs  . Financial resource strain: Not on file  . Food insecurity    Worry: Not on file    Inability: Not on file  . Transportation needs    Medical: Not on file    Non-medical: Not on file  Tobacco Use  . Smoking status: Current  Every Day Smoker    Packs/day: 0.25    Types: Cigarettes  . Smokeless tobacco: Never Used  . Tobacco comment: pt is also using nicotine patch  Substance and Sexual Activity  . Alcohol use: Yes    Comment: occ beer   . Drug use: No  . Sexual activity: Yes  Lifestyle  . Physical activity    Days per week: Not on file    Minutes per session: Not on file  . Stress: Not on file  Relationships  . Social Herbalist on phone: Not on file    Gets together: Not on file    Attends religious service: Not on file    Active member of club or organization: Not on file    Attends meetings of clubs or organizations: Not on file    Relationship status: Not on file  Other Topics Concern  . Not on file  Social History Narrative   Lives with mother, son, sister   Caffeine- none   Past Surgical History:  Procedure Laterality Date  . ROBOT ASSISTED LAPAROSCOPIC RADICAL PROSTATECTOMY N/A 09/13/2017   Procedure: XI ROBOTIC ASSISTED LAPAROSCOPIC RADICAL PROSTATECTOMY, BILATERAL PELVIC LYMPH NODE DISSECTION;  Surgeon: [name], MD;  Location: [facility];  Service: Urology;  Laterality: N/A;  . ROBOTIC ASSITED PARTIAL NEPHRECTOMY Right 01/04/2017   Procedure: XI ROBOTIC ASSITED PARTIAL NEPHRECTOMY;  Surgeon: [name], MD;  Location: [facility];  Service: Urology;  Laterality: Right;   Past Medical History:  Diagnosis Date  . Arthritis    ra  . Cancer [facility])    prostate   . Cancer of kidney ([place]) 2018   part of right kidney removed  . Chronic back pain    lower back burns some too  . Chronic neck pain   . Depression   . Herniated disc, cervical   . Hypertension   . Sickle cell trait ([place])   . Stroke [facility])    weakness in right hand and leg , numbness on left    BP 119/84   Pulse 83   Temp (!) 97.5 F (36.4 C)   Ht 6' (1.829 m)   Wt 160 lb (72.6 kg)   SpO2 97%   BMI 21.70 kg/m   Opioid Risk Score:   Fall Risk Score:  `1  Depression screen PHQ 2/9  Depression screen  [facility] 2/9 03/01/2019 08/23/2018 07/23/2018 05/29/2018 04/05/2018 02/05/2018 01/26/2018  Decreased Interest 0 0 0 2 0 1 2  Down, Depressed, Hopeless 0 0 0 1 0 1 2  PHQ - 2 Score 0 0 0 3 0 2 4  Altered sleeping 0 - - 3 - - 2  Tired, decreased energy 0 - - 2 - - 1  Change in appetite 0 - - 1 - - (No Data)  Feeling bad or failure about yourself  0 - - 1 - - 0  Trouble concentrating 0 - - 2 - - 2  Moving slowly or fidgety/restless 0 - - 2 - - 2  Suicidal thoughts 0 - - 0 - - 0  PHQ-9 Score 0 - - 14 - - 11  Some recent data might be hidden     Review of Systems  Constitutional: Negative.   HENT: Negative.   Eyes: Negative.   Respiratory: Negative.   Cardiovascular: Negative.   Gastrointestinal: Negative.   Endocrine: Negative.   Genitourinary: Negative.   Musculoskeletal: Positive for arthralgias and gait problem.  Skin: Negative.   Allergic/Immunologic: Negative.   Hematological: Negative.   Psychiatric/Behavioral: Negative.   All other systems reviewed and are negative.      Objective:   Physical Exam  Gen: no distress, normal appearing HEENT: oral mucosa pink and moist, NCAT Cardio: Reg rate Chest: normal effort, normal rate of breathing Abd: soft, non-distended Ext: no edema Skin: intact Neuro: DTR's 3+. Trace tone right biceps, wrist flexors, pecs tight also. RUE 3-4/5. LUE 4/5. LE 4- to 4/5. Senses pain and light touch Musculoskeletal: right shoulder tight--abduction to about 80 degrees,  Psych: pleasant, normal affect       Assessment & Plan:  1. 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 --continue HEP  2.  Pain Management:  -continueelavil25mg  qhs -gabapentin at 900mg  tid  RF            - diclofenac 3. Spasticity:Appears generally controlled maintainbaclofen at20mg  QID--no RFneeded -will arrange for botox to right pecs, biceps, wrist and finger flexors 300u - zanaflex    2mg  qid==no RFneeded 4. Anemia:follow up per primary  5. Right renal carcinoma s/p partial nephrectomy right (01/04/17), prostate cancer  -follow up with urology/oncology 6. . ETOH abuse/Tobacco: continue etoh abstinence 7. Right shoulder pain/capsulitis: continue HEP                  -  botox as above.                  -maintain diclofenac 50mg  bid 83minutes of tele-visit time was spent with this patient today. Follow up in 1 months  for botox

## 2019-03-27 NOTE — Patient Instructions (Signed)
PLEASE FEEL FREE TO CALL OUR OFFICE WITH ANY PROBLEMS OR QUESTIONS ([phone])      

## 2019-05-15 ENCOUNTER — Other Ambulatory Visit: Payer: Self-pay | Admitting: Physical Medicine & Rehabilitation

## 2019-05-15 DIAGNOSIS — M79609 Pain in unspecified limb: Secondary | ICD-10-CM

## 2019-05-15 DIAGNOSIS — R202 Paresthesia of skin: Secondary | ICD-10-CM

## 2019-05-29 ENCOUNTER — Encounter: Payer: Medicare Other | Admitting: Physical Medicine & Rehabilitation

## 2019-06-12 ENCOUNTER — Other Ambulatory Visit: Payer: Self-pay

## 2019-06-12 ENCOUNTER — Encounter: Payer: Medicare Other | Attending: Physical Medicine & Rehabilitation | Admitting: Physical Medicine & Rehabilitation

## 2019-06-12 ENCOUNTER — Encounter: Payer: Self-pay | Admitting: Physical Medicine & Rehabilitation

## 2019-06-12 DIAGNOSIS — I10 Essential (primary) hypertension: Secondary | ICD-10-CM | POA: Insufficient documentation

## 2019-06-12 DIAGNOSIS — Z9079 Acquired absence of other genital organ(s): Secondary | ICD-10-CM | POA: Diagnosis not present

## 2019-06-12 DIAGNOSIS — G8111 Spastic hemiplegia affecting right dominant side: Secondary | ICD-10-CM | POA: Diagnosis not present

## 2019-06-12 DIAGNOSIS — D573 Sickle-cell trait: Secondary | ICD-10-CM | POA: Insufficient documentation

## 2019-06-12 DIAGNOSIS — C61 Malignant neoplasm of prostate: Secondary | ICD-10-CM | POA: Diagnosis not present

## 2019-06-12 DIAGNOSIS — Z801 Family history of malignant neoplasm of trachea, bronchus and lung: Secondary | ICD-10-CM | POA: Insufficient documentation

## 2019-06-12 DIAGNOSIS — Z905 Acquired absence of kidney: Secondary | ICD-10-CM | POA: Insufficient documentation

## 2019-06-12 DIAGNOSIS — R252 Cramp and spasm: Secondary | ICD-10-CM | POA: Diagnosis not present

## 2019-06-12 DIAGNOSIS — Z8673 Personal history of transient ischemic attack (TIA), and cerebral infarction without residual deficits: Secondary | ICD-10-CM | POA: Insufficient documentation

## 2019-06-12 DIAGNOSIS — F1010 Alcohol abuse, uncomplicated: Secondary | ICD-10-CM | POA: Diagnosis not present

## 2019-06-12 DIAGNOSIS — M779 Enthesopathy, unspecified: Secondary | ICD-10-CM | POA: Diagnosis not present

## 2019-06-12 DIAGNOSIS — C641 Malignant neoplasm of right kidney, except renal pelvis: Secondary | ICD-10-CM | POA: Insufficient documentation

## 2019-06-12 DIAGNOSIS — F17210 Nicotine dependence, cigarettes, uncomplicated: Secondary | ICD-10-CM | POA: Diagnosis not present

## 2019-06-12 DIAGNOSIS — G8252 Quadriplegia, C1-C4 incomplete: Secondary | ICD-10-CM | POA: Diagnosis not present

## 2019-06-12 DIAGNOSIS — M25511 Pain in right shoulder: Secondary | ICD-10-CM | POA: Insufficient documentation

## 2019-06-12 DIAGNOSIS — M1990 Unspecified osteoarthritis, unspecified site: Secondary | ICD-10-CM | POA: Diagnosis not present

## 2019-06-12 DIAGNOSIS — Z8249 Family history of ischemic heart disease and other diseases of the circulatory system: Secondary | ICD-10-CM | POA: Insufficient documentation

## 2019-06-12 NOTE — Patient Instructions (Signed)
PLEASE FEEL FREE TO CALL OUR OFFICE WITH ANY PROBLEMS OR QUESTIONS ([phone])      

## 2019-06-12 NOTE — Progress Notes (Signed)
Botox Injection for spasticity using needle EMG guidance Indication: Spastic hemiparesis of right dominant side (HCC) G81.11   Dilution: 100 Units/ml        Total Units Injected: 300 Indication: Severe spasticity which interferes with ADL,mobility and/or  hygiene and is unresponsive to medication management and other conservative care Informed consent was obtained after describing risks and benefits of the procedure with the patient. This includes bleeding, bruising, infection, excessive weakness, or medication side effects. A REMS form is on file and signed.  Needle: 17mm injectable monopolar needle electrode  right Number of units per muscle Pectoralis Major 50 units Pectoralis Minor 50 units Biceps 0 units Brachioradialis 0 units FCR 25 units FCU 25 units FDS 50 units FDP 50 units FPL 0 units Pronator Teres 50 units Pronator Quadratus 0 units Lumbricals 0 units All injections were done after obtaining appropriate EMG activity and after negative drawback for blood. The patient tolerated the procedure well. Post procedure instructions were given. Return in about 3 months (around 09/10/2019).

## 2019-08-07 ENCOUNTER — Telehealth: Payer: Self-pay

## 2019-08-07 DIAGNOSIS — M79609 Pain in unspecified limb: Secondary | ICD-10-CM

## 2019-08-07 DIAGNOSIS — G894 Chronic pain syndrome: Secondary | ICD-10-CM

## 2019-08-07 DIAGNOSIS — S14129S Central cord syndrome at unspecified level of cervical spinal cord, sequela: Secondary | ICD-10-CM

## 2019-08-07 DIAGNOSIS — R202 Paresthesia of skin: Secondary | ICD-10-CM

## 2019-08-07 DIAGNOSIS — M7501 Adhesive capsulitis of right shoulder: Secondary | ICD-10-CM

## 2019-08-07 MED ORDER — GABAPENTIN 300 MG PO CAPS: ORAL_CAPSULE | ORAL | 2 refills | Status: DC

## 2019-08-07 MED ORDER — DICLOFENAC SODIUM 50 MG PO TBEC: 50.0000 mg | DELAYED_RELEASE_TABLET | Freq: Two times a day (BID) | ORAL | 3 refills | Status: DC

## 2019-08-07 MED ORDER — BACLOFEN 20 MG PO TABS: 20.0000 mg | ORAL_TABLET | Freq: Four times a day (QID) | ORAL | 1 refills | Status: DC

## 2019-08-07 NOTE — Telephone Encounter (Signed)
Patient called and requested refills on meds.  Refilled using clinical protocol.

## 2019-08-24 ENCOUNTER — Other Ambulatory Visit: Payer: Self-pay | Admitting: Physical Medicine & Rehabilitation

## 2019-08-24 DIAGNOSIS — S14129S Central cord syndrome at unspecified level of cervical spinal cord, sequela: Secondary | ICD-10-CM

## 2019-09-02 ENCOUNTER — Encounter: Payer: Self-pay | Admitting: Occupational Therapy

## 2019-09-02 NOTE — Therapy (Signed)
[facility] [address] [place], [place], [address] Phone: [phone]   Fax:  [phone]  Occupational Therapy Treatment  Patient Details  Name: Patrick Romero MRN: [id number] Date of Birth: 10/09/1960 No data recorded  Encounter Date: 09/02/2019    Past Medical History:  Diagnosis Date  . Arthritis    ra  . Cancer [facility])    prostate   . Cancer of kidney ([place]) 2018   part of right kidney removed  . Chronic back pain    lower back burns some too  . Chronic neck pain   . Depression   . Herniated disc, cervical   . Hypertension   . Sickle cell trait ([place])   . Stroke [facility])    weakness in right hand and leg , numbness on left     Past Surgical History:  Procedure Laterality Date  . ROBOT ASSISTED LAPAROSCOPIC RADICAL PROSTATECTOMY N/A 09/13/2017   Procedure: XI ROBOTIC ASSISTED LAPAROSCOPIC RADICAL PROSTATECTOMY, BILATERAL PELVIC LYMPH NODE DISSECTION;  Surgeon: [name], MD;  [facility];  Service: Urology;  Laterality: N/A;  . ROBOTIC ASSITED PARTIAL NEPHRECTOMY Right 01/04/2017   Procedure: XI ROBOTIC ASSITED PARTIAL NEPHRECTOMY;  Surgeon: [name], MD;  Location: [facility];  Service: Urology;  Laterality: Right;    There were no vitals filed for this visit.                          OT Short Term Goals - 09/02/19 1324      OT SHORT TERM GOAL #1   Title  Patient will complete a home exercise program designed to improve right upper extremity range of motion due 09/25/18    Baseline  Patient has prior HEP needs updating as spasticity and joint capsule restrictions more prevalent    Time  4    Period  Weeks    Status  Achieved    Target Date  09/25/18      OT SHORT TERM GOAL #2   Title  Patient will demonstrate a 5 lb increase in hand strength right    Baseline  15lb    Time  4    Period  Weeks    Status  Unable to assess      OT SHORT TERM GOAL #3   Title   Patient will cut food using two hands with modified independence    Baseline  Patient's mom typically cuts food for patient    Time  4    Period  Weeks    Status  Unable to assess      OT SHORT TERM GOAL #4   Title  Patient will demonstrate 90 degrees of passive shoulder flexion, abduction while supine in right upper extremity with pain no greater than 4/10    Baseline  65 degrees of active shoulder flex/abd with 8/10 pain    Time  4    Period  Weeks    Status  Achieved      OT SHORT TERM GOAL #5   Title  Patient will complete 9 hole peg test with RUE in 40 seconds or less to assist with fine motor coordination    Baseline  48.18    Time  4    Period  Weeks    Status  Unable to assess        OT Long Term Goals - 09/02/19 1325      OT LONG TERM GOAL #  1   Title  Patient will complete an update home exercise program to address active range of motion and strength throughout dominant RUE with pain no greater than 3/10 - 10/24/2018    Baseline  Pain 8-9/10 - constant    Time  8    Period  Weeks    Status  Unable to assess      OT LONG TERM GOAL #2   Title  Patient will demonstrate mid level reach to obtain lighteweight object from chest high shelf with RUE with min assist    Baseline  65 degrees shoulder flexion    Time  8    Status  Unable to assess      OT LONG TERM GOAL #3   Title  Patient will demonstrate awareness of strategies to reduce shoulder pain at night to help with sleep    Baseline  pain disrupts sleep pattern    Time  8    Period  Weeks    Status  Unable to assess      OT LONG TERM GOAL #4   Title  Patient will demonstrate abilty to put on / take off and demonstrate awareness of wearing schedule for right UE orthosis    Baseline  Patient does not currently have hand orthosis    Time  8    Period  Weeks    Status  Unable to assess      OT LONG TERM GOAL #5   Title  Patient will demonstrate awareness of compensatory strategies to help him button and zipper  clothing    Baseline  Patient avoids buttons, zippers at this time    Time  8    Period  Weeks    Status  Unable to assess            Plan - 09/02/19 1325    Clinical Impression Statement  Pt has not returned since last visit in March therefore will d/c from OT       Patient will benefit from skilled therapeutic intervention in order to improve the following deficits and impairments:           Visit Diagnosis: No diagnosis found.    Problem List Patient Active Problem List   Diagnosis Date Noted  . Spastic hemiparesis of right dominant side ([place]) 06/12/2019  . Spasticity 03/27/2019  . Depression 05/29/2018  . Prostate cancer ([place]) 09/13/2017  . Hypertension 02/17/2017  . Renal carcinoma, right ([place]) 11/23/2016  . Atherosclerosis of aorta ([place]) 11/23/2016  . 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 Dysesthesia   . History of syncope   . Tetraparesis ([place])   . Central cord syndrome ([place])   . Anemia   . ETOH abuse   . Neuropathic pain   . Neck pain   . Chronic neck and back pain 09/01/2016  . Alcohol intoxication ([place]) 09/01/2016  . Tobacco abuse 09/01/2016  OCCUPATIONAL THERAPY DISCHARGE SUMMARY  Visits from Start of Care: 3  Current functional level related to goals / functional outcomes: unknown   Remaining deficits: unknown   Education / Equipment: Pt had only attended 2 sessions (plus eval) Plan: Patient agrees to discharge.  Patient goals were not met. Patient is being discharged due to not returning since the last visit.  ?????     Quay Burow , OTR/L 09/02/2019, 1:26 PM  [facility] [address] [place], [place], [address] Phone: [phone]   Fax:  [phone]  Name: Patrick Romero MRN: [id number] Date of Birth: 1960/07/30

## 2019-09-11 ENCOUNTER — Encounter: Payer: Medicare Other | Attending: Physical Medicine & Rehabilitation | Admitting: Physical Medicine & Rehabilitation

## 2019-09-11 DIAGNOSIS — Z8249 Family history of ischemic heart disease and other diseases of the circulatory system: Secondary | ICD-10-CM | POA: Insufficient documentation

## 2019-09-11 DIAGNOSIS — M779 Enthesopathy, unspecified: Secondary | ICD-10-CM | POA: Insufficient documentation

## 2019-09-11 DIAGNOSIS — Z8673 Personal history of transient ischemic attack (TIA), and cerebral infarction without residual deficits: Secondary | ICD-10-CM | POA: Insufficient documentation

## 2019-09-11 DIAGNOSIS — I10 Essential (primary) hypertension: Secondary | ICD-10-CM | POA: Insufficient documentation

## 2019-09-11 DIAGNOSIS — Z905 Acquired absence of kidney: Secondary | ICD-10-CM | POA: Insufficient documentation

## 2019-09-11 DIAGNOSIS — Z801 Family history of malignant neoplasm of trachea, bronchus and lung: Secondary | ICD-10-CM | POA: Insufficient documentation

## 2019-09-11 DIAGNOSIS — D573 Sickle-cell trait: Secondary | ICD-10-CM | POA: Insufficient documentation

## 2019-09-11 DIAGNOSIS — R252 Cramp and spasm: Secondary | ICD-10-CM | POA: Insufficient documentation

## 2019-09-11 DIAGNOSIS — M25511 Pain in right shoulder: Secondary | ICD-10-CM | POA: Insufficient documentation

## 2019-09-11 DIAGNOSIS — F17210 Nicotine dependence, cigarettes, uncomplicated: Secondary | ICD-10-CM | POA: Insufficient documentation

## 2019-09-11 DIAGNOSIS — M1990 Unspecified osteoarthritis, unspecified site: Secondary | ICD-10-CM | POA: Insufficient documentation

## 2019-09-11 DIAGNOSIS — F1010 Alcohol abuse, uncomplicated: Secondary | ICD-10-CM | POA: Insufficient documentation

## 2019-09-11 DIAGNOSIS — Z9079 Acquired absence of other genital organ(s): Secondary | ICD-10-CM | POA: Insufficient documentation

## 2019-09-11 DIAGNOSIS — C61 Malignant neoplasm of prostate: Secondary | ICD-10-CM | POA: Insufficient documentation

## 2019-09-11 DIAGNOSIS — G8252 Quadriplegia, C1-C4 incomplete: Secondary | ICD-10-CM | POA: Insufficient documentation

## 2019-09-11 DIAGNOSIS — C641 Malignant neoplasm of right kidney, except renal pelvis: Secondary | ICD-10-CM | POA: Insufficient documentation

## 2019-10-01 ENCOUNTER — Other Ambulatory Visit: Payer: Self-pay

## 2019-10-01 ENCOUNTER — Ambulatory Visit: Payer: Medicare Other | Attending: Internal Medicine | Admitting: Internal Medicine

## 2019-10-01 DIAGNOSIS — Z8546 Personal history of malignant neoplasm of prostate: Secondary | ICD-10-CM | POA: Diagnosis not present

## 2019-10-01 DIAGNOSIS — G8250 Quadriplegia, unspecified: Secondary | ICD-10-CM | POA: Insufficient documentation

## 2019-10-01 DIAGNOSIS — Z85528 Personal history of other malignant neoplasm of kidney: Secondary | ICD-10-CM | POA: Insufficient documentation

## 2019-10-01 DIAGNOSIS — F17210 Nicotine dependence, cigarettes, uncomplicated: Secondary | ICD-10-CM | POA: Diagnosis not present

## 2019-10-01 DIAGNOSIS — Z7982 Long term (current) use of aspirin: Secondary | ICD-10-CM | POA: Insufficient documentation

## 2019-10-01 DIAGNOSIS — I10 Essential (primary) hypertension: Secondary | ICD-10-CM | POA: Diagnosis present

## 2019-10-01 DIAGNOSIS — Z9079 Acquired absence of other genital organ(s): Secondary | ICD-10-CM | POA: Insufficient documentation

## 2019-10-01 DIAGNOSIS — Z905 Acquired absence of kidney: Secondary | ICD-10-CM | POA: Diagnosis not present

## 2019-10-01 DIAGNOSIS — Z79899 Other long term (current) drug therapy: Secondary | ICD-10-CM | POA: Diagnosis not present

## 2019-10-01 DIAGNOSIS — R079 Chest pain, unspecified: Secondary | ICD-10-CM | POA: Diagnosis not present

## 2019-10-01 DIAGNOSIS — F17200 Nicotine dependence, unspecified, uncomplicated: Secondary | ICD-10-CM | POA: Diagnosis not present

## 2019-10-01 DIAGNOSIS — E7800 Pure hypercholesterolemia, unspecified: Secondary | ICD-10-CM | POA: Diagnosis not present

## 2019-10-01 MED ORDER — ATORVASTATIN CALCIUM 10 MG PO TABS: 10.0000 mg | ORAL_TABLET | Freq: Every day | ORAL | 6 refills | Status: DC

## 2019-10-01 MED ORDER — ASPIRIN EC 81 MG PO TBEC: 81.0000 mg | DELAYED_RELEASE_TABLET | Freq: Every day | ORAL | 0 refills | Status: AC

## 2019-10-01 NOTE — Progress Notes (Signed)
Virtual Visit via Telephone Note Due to current restrictions/limitations of in-office visits due to the COVID-19 pandemic, this scheduled clinical appointment was converted to a telehealth visit I connected with Patrick Romero on 10/01/19 at  3:10 PM EDT by telephone and verified that I am speaking with the correct person using two identifiers. I am in my office.  The patient is at home.  Only the patient and myself participated in this encounter.  I discussed the limitations, risks, security and privacy concerns of performing an evaluation and management service by telephone and the availability of in person appointments. I also discussed with the patient that there may be a patient responsible charge related to this service. The patient expressed understanding and agreed to proceed.   History of Present Illness: Patient with history of HTN, HL, tob dep, renal carcinoma status post partial nephrectomy 12/2016, prostate CA status post prostatectomy with LN dissection, central cord syndrome(08/2016 cervical injury from fall) andincomplete quadriparesis (followed by PMR), anemia, ETOH.    Purpose of today's visit is chronic disease management.  Patient last seen 03/01/2019.  HYPERTENSION Currently taking: see medication list Med Adherence: '[x]'  Yes    '[]'  No Medication side effects: '[]'  Yes    '[x]'  No Adherence with salt restriction: '[x]'  Yes    '[]'  No Home Monitoring?: '[x]'  Yes    '[]'  No Monitoring Frequency: 2 x a wk Home BP results range: does not recall numbers but states they have been good.  Reading with his visit with Dr. Naaman Plummer was nl SOB? '[x]'  Yes -wheezing sometimes at nights.  Gives hx of asthma growing up but though he out grew it.  Coughing "fit' periodically Chest Pain?: '[x]'  Yes - reports intermittent episodes of burning in the chest for past 3 wks.  Can radiate to LT arm.  Some nausea and sweating with the episodes.  Episodes occur "when I over exert myself."  One time he felt he was having a  heart attack and he was NO fhx of heart disease.    Not taking ASA as prescribed.  No longer taking Lipitor.  Misplaced his bottle several mths ago and never called for refills.  Still smoking.  1 pk last 3 days.  Started smoking a little more after his GF died of stage 4 lung CA on 09/05/2019.   Leg swelling?: '[]'  Yes    '[x]'  No Headaches?: '[]'  Yes    '[x]'  No Dizziness? '[]'  Yes    '[x]'  No Comments:   Hx of Prostate CA/Renal Cell CA:  Has appt with Dr. Tammi Klippel coming up this August.    Outpatient Encounter Medications as of 10/01/2019  Medication Sig  . amitriptyline (ELAVIL) 25 MG tablet Take 1 tablet (25 mg total) by mouth at bedtime.  Marland Kitchen amLODipine (NORVASC) 5 MG tablet Take one tablet by mouth once a day.  . baclofen (LIORESAL) 20 MG tablet Take 1 tablet (20 mg total) by mouth 4 (four) times daily.  Marland Kitchen gabapentin (NEURONTIN) 300 MG capsule TAKE 3 CAPSULES BY MOUTH 3 TIMES A DAY  . tiZANidine (ZANAFLEX) 2 MG tablet TAKE 1 TABLET BY MOUTH 4 TIMES A DAY  . aspirin EC 81 MG tablet Take 1 tablet (81 mg total) by mouth daily. (Patient not taking: Reported on 10/01/2019)  . atorvastatin (LIPITOR) 10 MG tablet Take 1 tablet (10 mg total) by mouth daily. (Patient not taking: Reported on 10/01/2019)  . diclofenac (VOLTAREN) 50 MG EC tablet Take 1 tablet (50 mg total) by mouth 2 (two)  times daily with a meal.  . HYDROcodone-acetaminophen (NORCO) 10-325 MG tablet LIMIT 1/2 1 TABLET BY MOUTH 3 5 TIMES PER DAY IF TOLERATED  . NARCAN 4 MG/0.1ML LIQD nasal spray kit As needed  . nicotine (NICODERM CQ - DOSED IN MG/24 HOURS) 21 mg/24hr patch Place 1 patch (21 mg total) onto the skin daily. (Patient taking differently: Place 21 mg onto the skin 2 (two) times a week. )   No facility-administered encounter medications on file as of 10/01/2019.    Observations/Objective: Results for orders placed or performed in visit on 08/23/18  CBC  Result Value Ref Range   WBC 6.7 3.4 - 10.8 x10E3/uL   RBC 4.68 4.14 - 5.80 x10E6/uL    Hemoglobin 12.0 (L) 13.0 - 17.7 g/dL   Hematocrit 38.3 37.5 - 51.0 %   MCV 82 79 - 97 fL   MCH 25.6 (L) 26.6 - 33.0 pg   MCHC 31.3 (L) 31.5 - 35.7 g/dL   RDW 13.9 11.6 - 15.4 %   Platelets 479 (H) 150 - 450 x10E3/uL  Comprehensive metabolic panel  Result Value Ref Range   Glucose 90 65 - 99 mg/dL   BUN 9 6 - 24 mg/dL   Creatinine, Ser 1.28 (H) 0.76 - 1.27 mg/dL   GFR calc non Af Amer 61 >59 mL/min/1.73   GFR calc Af Amer 71 >59 mL/min/1.73   BUN/Creatinine Ratio 7 (L) 9 - 20   Sodium 140 134 - 144 mmol/L   Potassium 4.8 3.5 - 5.2 mmol/L   Chloride 103 96 - 106 mmol/L   CO2 22 20 - 29 mmol/L   Calcium 9.6 8.7 - 10.2 mg/dL   Total Protein 7.8 6.0 - 8.5 g/dL   Albumin 4.4 3.8 - 4.9 g/dL   Globulin, Total 3.4 1.5 - 4.5 g/dL   Albumin/Globulin Ratio 1.3 1.2 - 2.2   Bilirubin Total 0.6 0.0 - 1.2 mg/dL   Alkaline Phosphatase 78 39 - 117 IU/L   AST 17 0 - 40 IU/L   ALT 16 0 - 44 IU/L  Lipid panel  Result Value Ref Range   Cholesterol, Total 177 100 - 199 mg/dL   Triglycerides 163 (H) 0 - 149 mg/dL   HDL 45 >39 mg/dL   VLDL Cholesterol Cal 33 5 - 40 mg/dL   LDL Calculated 99 0 - 99 mg/dL   Chol/HDL Ratio 3.9 0.0 - 5.0 ratio     Assessment and Plan: 1. Chest pain in adult Chest pain sounds suspicious for being cardiac in nature.  He does have risk factors of hypertension, hyperlipidemia and tobacco dependence.  Advised patient to restart his aspirin and the atorvastatin.  Referral submitted to cardiology.  - Ambulatory referral to Cardiology - aspirin EC 81 MG tablet; Take 1 tablet (81 mg total) by mouth daily.  Dispense: 100 tablet; Refill: 0  2. Essential hypertension Stable.  Continue current medications and low-salt diet - CBC; Future - Comprehensive metabolic panel; Future  3. Tobacco dependence Strongly advised to quit.  Discussed health risks associated with smoking.  Patient states that when he is mentally ready to quit he will start using the nicotine patches and  lozenges again.  Less than 5 minutes spent on counseling.  4. Pure hypercholesterolemia - Lipid panel; Future - atorvastatin (LIPITOR) 10 MG tablet; Take 1 tablet (10 mg total) by mouth daily.  Dispense: 30 tablet; Refill: 6   Follow Up Instructions: 3-4 wks    I discussed the assessment and treatment plan  with the patient. The patient was provided an opportunity to ask questions and all were answered. The patient agreed with the plan and demonstrated an understanding of the instructions.   The patient was advised to call back or seek an in-person evaluation if the symptoms worsen or if the condition fails to improve as anticipated.  I provided 25 minutes of non-face-to-face time during this encounter.   Karle Plumber, MD

## 2019-10-16 ENCOUNTER — Other Ambulatory Visit: Payer: Self-pay

## 2019-10-16 ENCOUNTER — Encounter: Payer: Self-pay | Admitting: Physical Medicine & Rehabilitation

## 2019-10-16 ENCOUNTER — Encounter: Payer: Medicare Other | Attending: Physical Medicine & Rehabilitation | Admitting: Physical Medicine & Rehabilitation

## 2019-10-16 VITALS — BP 116/74 | HR 108 | Temp 97.5°F | Ht 72.0 in | Wt 152.0 lb

## 2019-10-16 DIAGNOSIS — F1010 Alcohol abuse, uncomplicated: Secondary | ICD-10-CM | POA: Diagnosis not present

## 2019-10-16 DIAGNOSIS — M25511 Pain in right shoulder: Secondary | ICD-10-CM | POA: Diagnosis not present

## 2019-10-16 DIAGNOSIS — Z8673 Personal history of transient ischemic attack (TIA), and cerebral infarction without residual deficits: Secondary | ICD-10-CM | POA: Diagnosis not present

## 2019-10-16 DIAGNOSIS — Z801 Family history of malignant neoplasm of trachea, bronchus and lung: Secondary | ICD-10-CM | POA: Diagnosis not present

## 2019-10-16 DIAGNOSIS — D573 Sickle-cell trait: Secondary | ICD-10-CM | POA: Diagnosis not present

## 2019-10-16 DIAGNOSIS — I10 Essential (primary) hypertension: Secondary | ICD-10-CM | POA: Diagnosis not present

## 2019-10-16 DIAGNOSIS — C641 Malignant neoplasm of right kidney, except renal pelvis: Secondary | ICD-10-CM | POA: Insufficient documentation

## 2019-10-16 DIAGNOSIS — M1990 Unspecified osteoarthritis, unspecified site: Secondary | ICD-10-CM | POA: Insufficient documentation

## 2019-10-16 DIAGNOSIS — R252 Cramp and spasm: Secondary | ICD-10-CM | POA: Diagnosis not present

## 2019-10-16 DIAGNOSIS — Z9079 Acquired absence of other genital organ(s): Secondary | ICD-10-CM | POA: Insufficient documentation

## 2019-10-16 DIAGNOSIS — C61 Malignant neoplasm of prostate: Secondary | ICD-10-CM | POA: Insufficient documentation

## 2019-10-16 DIAGNOSIS — M779 Enthesopathy, unspecified: Secondary | ICD-10-CM | POA: Insufficient documentation

## 2019-10-16 DIAGNOSIS — Z8249 Family history of ischemic heart disease and other diseases of the circulatory system: Secondary | ICD-10-CM | POA: Insufficient documentation

## 2019-10-16 DIAGNOSIS — S14129S Central cord syndrome at unspecified level of cervical spinal cord, sequela: Secondary | ICD-10-CM | POA: Diagnosis present

## 2019-10-16 DIAGNOSIS — G8252 Quadriplegia, C1-C4 incomplete: Secondary | ICD-10-CM | POA: Diagnosis present

## 2019-10-16 DIAGNOSIS — Z905 Acquired absence of kidney: Secondary | ICD-10-CM | POA: Insufficient documentation

## 2019-10-16 DIAGNOSIS — F17210 Nicotine dependence, cigarettes, uncomplicated: Secondary | ICD-10-CM | POA: Insufficient documentation

## 2019-10-16 DIAGNOSIS — G8111 Spastic hemiplegia affecting right dominant side: Secondary | ICD-10-CM

## 2019-10-16 MED ORDER — TIZANIDINE HCL 2 MG PO TABS: 2.0000 mg | ORAL_TABLET | Freq: Four times a day (QID) | ORAL | 5 refills | Status: DC
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
## 2019-10-28 ENCOUNTER — Encounter: Payer: Self-pay | Admitting: Interventional Cardiology

## 2019-10-28 ENCOUNTER — Ambulatory Visit (INDEPENDENT_AMBULATORY_CARE_PROVIDER_SITE_OTHER): Payer: Medicare Other | Admitting: Interventional Cardiology

## 2019-10-28 ENCOUNTER — Other Ambulatory Visit: Payer: Self-pay

## 2019-10-28 VITALS — BP 94/64 | HR 106 | Ht 72.0 in | Wt 150.1 lb

## 2019-10-28 DIAGNOSIS — I700 Atherosclerosis of aorta: Secondary | ICD-10-CM | POA: Diagnosis not present

## 2019-10-28 DIAGNOSIS — Z720 Tobacco use: Secondary | ICD-10-CM

## 2019-10-28 DIAGNOSIS — R072 Precordial pain: Secondary | ICD-10-CM | POA: Diagnosis not present

## 2019-10-28 NOTE — Patient Instructions (Signed)
Medication Instructions:  Your physician recommends that you continue on your current medications as directed. Please refer to the Current Medication list given to you today.  If you need a refill on your cardiac medications before your next appointment, please call your pharmacy.   Lab work: None Ordered  If you have labs (blood work) drawn today and your tests are completely normal, you will receive your results only by: Marland Kitchen MyChart Message (if you have MyChart) OR . A paper copy in the mail If you have any lab test that is abnormal or we need to change your treatment, we will call you to review the results.  Testing/Procedures: None ordered  Follow-Up: . AS NEEDED  Any Other Special Instructions Will Be Listed Below (If Applicable).   High-Fiber Diet Fiber, also called dietary fiber, is a type of carbohydrate that is found in fruits, vegetables, whole grains, and beans. A high-fiber diet can have many health benefits. Your health care provider may recommend a high-fiber diet to help:  Prevent constipation. Fiber can make your bowel movements more regular.  Lower your cholesterol.  Relieve the following conditions: ? Swelling of veins in the anus (hemorrhoids). ? Swelling and irritation (inflammation) of specific areas of the digestive tract (uncomplicated diverticulosis). ? A problem of the large intestine (colon) that sometimes causes pain and diarrhea (irritable bowel syndrome, IBS).  Prevent overeating as part of a weight-loss plan.  Prevent heart disease, type 2 diabetes, and certain cancers. What is my plan? The recommended daily fiber intake in grams (g) includes:  38 g for men age 5 or younger.  30 g for men over age 28.  53 g for women age 59 or younger.  21 g for women over age 30. You can get the recommended daily intake of dietary fiber by:  Eating a variety of fruits, vegetables, grains, and beans.  Taking a fiber supplement, if it is not possible to  get enough fiber through your diet. What do I need to know about a high-fiber diet?  It is better to get fiber through food sources rather than from fiber supplements. There is not a lot of research about how effective supplements are.  Always check the fiber content on the nutrition facts label of any prepackaged food. Look for foods that contain 5 g of fiber or more per serving.  Talk with a diet and nutrition specialist (dietitian) if you have questions about specific foods that are recommended or not recommended for your medical condition, especially if those foods are not listed below.  Gradually increase how much fiber you consume. If you increase your intake of dietary fiber too quickly, you may have bloating, cramping, or gas.  Drink plenty of water. Water helps you to digest fiber. What are tips for following this plan?  Eat a wide variety of high-fiber foods.  Make sure that half of the grains that you eat each day are whole grains.  Eat breads and cereals that are made with whole-grain flour instead of refined flour or white flour.  Eat brown rice, bulgur wheat, or millet instead of white rice.  Start the day with a breakfast that is high in fiber, such as a cereal that contains 5 g of fiber or more per serving.  Use beans in place of meat in soups, salads, and pasta dishes.  Eat high-fiber snacks, such as berries, raw vegetables, nuts, and popcorn.  Choose whole fruits and vegetables instead of processed forms like juice or sauce.  What foods can I eat?  Fruits Berries. Pears. Apples. Oranges. Avocado. Prunes and raisins. Dried figs. Vegetables Sweet potatoes. Spinach. Kale. Artichokes. Cabbage. Broccoli. Cauliflower. Green peas. Carrots. Squash. Grains Whole-grain breads. Multigrain cereal. Oats and oatmeal. Brown rice. Barley. Bulgur wheat. [place]. Quinoa. Bran muffins. Popcorn. Rye wafer crackers. Meats and other proteins Navy, kidney, and pinto beans. Soybeans.  Split peas. Lentils. Nuts and seeds. Dairy Fiber-fortified yogurt. Beverages Fiber-fortified soy milk. Fiber-fortified orange juice. Other foods Fiber bars. The items listed above may not be a complete list of recommended foods and beverages. Contact a dietitian for more options. What foods are not recommended? Fruits Fruit juice. Cooked, strained fruit. Vegetables Fried potatoes. Canned vegetables. Well-cooked vegetables. Grains White bread. Pasta made with refined flour. White rice. Meats and other proteins Fatty cuts of meat. Fried chicken or fried fish. Dairy Milk. Yogurt. Cream cheese. Sour cream. Fats and oils Butters. Beverages Soft drinks. Other foods Cakes and pastries. The items listed above may not be a complete list of foods and beverages to avoid. Contact a dietitian for more information. Summary  Fiber is a type of carbohydrate. It is found in fruits, vegetables, whole grains, and beans.  There are many health benefits of eating a high-fiber diet, such as preventing constipation, lowering blood cholesterol, helping with weight loss, and reducing your risk of heart disease, diabetes, and certain cancers.  Gradually increase your intake of fiber. Increasing too fast can result in cramping, bloating, and gas. Drink plenty of water while you increase your fiber.  The best sources of fiber include whole fruits and vegetables, whole grains, nuts, seeds, and beans. This information is not intended to replace advice given to you by your health care provider. Make sure you discuss any questions you have with your health care provider. Document Revised: 05/01/2017 Document Reviewed: 05/01/2017 Elsevier Patient Education  2020 [employer].
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
## 2019-11-01 ENCOUNTER — Ambulatory Visit: Payer: Medicare Other | Admitting: Internal Medicine

## 2019-11-13 ENCOUNTER — Encounter: Payer: Medicare Other | Attending: Physical Medicine & Rehabilitation | Admitting: Physical Medicine & Rehabilitation

## 2019-11-13 ENCOUNTER — Other Ambulatory Visit: Payer: Self-pay

## 2019-11-13 ENCOUNTER — Encounter: Payer: Self-pay | Admitting: Physical Medicine & Rehabilitation

## 2019-11-13 VITALS — BP 114/84 | HR 106 | Temp 97.5°F | Ht 72.0 in | Wt 148.0 lb

## 2019-11-13 DIAGNOSIS — G8252 Quadriplegia, C1-C4 incomplete: Secondary | ICD-10-CM | POA: Insufficient documentation

## 2019-11-13 DIAGNOSIS — I10 Essential (primary) hypertension: Secondary | ICD-10-CM | POA: Insufficient documentation

## 2019-11-13 DIAGNOSIS — R252 Cramp and spasm: Secondary | ICD-10-CM | POA: Diagnosis not present

## 2019-11-13 DIAGNOSIS — S14129S Central cord syndrome at unspecified level of cervical spinal cord, sequela: Secondary | ICD-10-CM | POA: Diagnosis present

## 2019-11-13 DIAGNOSIS — G8111 Spastic hemiplegia affecting right dominant side: Secondary | ICD-10-CM

## 2019-11-13 DIAGNOSIS — C641 Malignant neoplasm of right kidney, except renal pelvis: Secondary | ICD-10-CM | POA: Insufficient documentation

## 2019-11-13 DIAGNOSIS — F17210 Nicotine dependence, cigarettes, uncomplicated: Secondary | ICD-10-CM | POA: Diagnosis not present

## 2019-11-13 DIAGNOSIS — Z9079 Acquired absence of other genital organ(s): Secondary | ICD-10-CM | POA: Insufficient documentation

## 2019-11-13 DIAGNOSIS — F1010 Alcohol abuse, uncomplicated: Secondary | ICD-10-CM | POA: Diagnosis not present

## 2019-11-13 DIAGNOSIS — Z905 Acquired absence of kidney: Secondary | ICD-10-CM | POA: Diagnosis not present

## 2019-11-13 DIAGNOSIS — M779 Enthesopathy, unspecified: Secondary | ICD-10-CM | POA: Insufficient documentation

## 2019-11-13 DIAGNOSIS — Z8673 Personal history of transient ischemic attack (TIA), and cerebral infarction without residual deficits: Secondary | ICD-10-CM | POA: Diagnosis not present

## 2019-11-13 DIAGNOSIS — Z8249 Family history of ischemic heart disease and other diseases of the circulatory system: Secondary | ICD-10-CM | POA: Diagnosis not present

## 2019-11-13 DIAGNOSIS — M25511 Pain in right shoulder: Secondary | ICD-10-CM | POA: Diagnosis not present

## 2019-11-13 DIAGNOSIS — M1990 Unspecified osteoarthritis, unspecified site: Secondary | ICD-10-CM | POA: Insufficient documentation

## 2019-11-13 DIAGNOSIS — C61 Malignant neoplasm of prostate: Secondary | ICD-10-CM | POA: Insufficient documentation

## 2019-11-13 DIAGNOSIS — D573 Sickle-cell trait: Secondary | ICD-10-CM | POA: Diagnosis not present

## 2019-11-13 DIAGNOSIS — B20 Human immunodeficiency virus [HIV] disease: Secondary | ICD-10-CM | POA: Diagnosis not present

## 2019-11-13 DIAGNOSIS — Z801 Family history of malignant neoplasm of trachea, bronchus and lung: Secondary | ICD-10-CM | POA: Insufficient documentation

## 2019-11-13 NOTE — Patient Instructions (Signed)
PLEASE FEEL FREE TO CALL OUR OFFICE WITH ANY PROBLEMS OR QUESTIONS ([phone])      

## 2019-11-13 NOTE — Progress Notes (Signed)
Botox Injection for spasticity using needle EMG guidance Indication: spastic right hemiparesis G81.11   Dilution: 100 Units/ml        Total Units Injected: 400 Indication: Severe spasticity which interferes with ADL,mobility and/or  hygiene and is unresponsive to medication management and other conservative care Informed consent was obtained after describing risks and benefits of the procedure with the patient. This includes bleeding, bruising, infection, excessive weakness, or medication side effects. A REMS form is on file and signed.  Needle: 36mm injectable monopolar needle electrode  Right  Number of units per muscle Pectoralis Major 50 units Pectoralis Minor 50 units Biceps 100 units Brachioradialis 0 units FCR 0 units FCU 0 units FDS 100 units FDP 100 units FPL 0 units Pronator Teres 0 units Pronator Quadratus 0 units Lumbricals 0 units All injections were done after obtaining appropriate EMG activity and after negative drawback for blood. The patient tolerated the procedure well. Post procedure instructions were given. Return in about 3 months (around 02/13/2020).

## 2019-11-29 ENCOUNTER — Other Ambulatory Visit: Payer: Self-pay | Admitting: Physical Medicine & Rehabilitation

## 2019-11-29 DIAGNOSIS — R202 Paresthesia of skin: Secondary | ICD-10-CM

## 2019-11-29 DIAGNOSIS — M79609 Pain in unspecified limb: Secondary | ICD-10-CM

## 2019-12-03 ENCOUNTER — Other Ambulatory Visit: Payer: Self-pay | Admitting: Internal Medicine

## 2019-12-03 DIAGNOSIS — I10 Essential (primary) hypertension: Secondary | ICD-10-CM

## 2019-12-12 ENCOUNTER — Ambulatory Visit: Payer: Medicare Other | Admitting: Internal Medicine

## 2020-01-06 ENCOUNTER — Other Ambulatory Visit: Payer: Self-pay

## 2020-01-06 DIAGNOSIS — S14129S Central cord syndrome at unspecified level of cervical spinal cord, sequela: Secondary | ICD-10-CM

## 2020-01-06 MED ORDER — BACLOFEN 20 MG PO TABS: 20.0000 mg | ORAL_TABLET | Freq: Four times a day (QID) | ORAL | 1 refills | Status: DC

## 2020-01-10 ENCOUNTER — Ambulatory Visit: Payer: Medicare Other | Attending: Internal Medicine | Admitting: Internal Medicine

## 2020-01-10 ENCOUNTER — Other Ambulatory Visit: Payer: Self-pay

## 2020-01-10 ENCOUNTER — Encounter: Payer: Self-pay | Admitting: Internal Medicine

## 2020-01-10 VITALS — BP 133/83 | HR 115 | Temp 98.8°F | Resp 16 | Wt 146.0 lb

## 2020-01-10 DIAGNOSIS — M25511 Pain in right shoulder: Secondary | ICD-10-CM | POA: Insufficient documentation

## 2020-01-10 DIAGNOSIS — Z7982 Long term (current) use of aspirin: Secondary | ICD-10-CM | POA: Diagnosis not present

## 2020-01-10 DIAGNOSIS — M25551 Pain in right hip: Secondary | ICD-10-CM | POA: Diagnosis not present

## 2020-01-10 DIAGNOSIS — F1010 Alcohol abuse, uncomplicated: Secondary | ICD-10-CM | POA: Diagnosis not present

## 2020-01-10 DIAGNOSIS — Z8546 Personal history of malignant neoplasm of prostate: Secondary | ICD-10-CM | POA: Diagnosis not present

## 2020-01-10 DIAGNOSIS — M549 Dorsalgia, unspecified: Secondary | ICD-10-CM | POA: Insufficient documentation

## 2020-01-10 DIAGNOSIS — E7800 Pure hypercholesterolemia, unspecified: Secondary | ICD-10-CM | POA: Diagnosis not present

## 2020-01-10 DIAGNOSIS — F17210 Nicotine dependence, cigarettes, uncomplicated: Secondary | ICD-10-CM | POA: Insufficient documentation

## 2020-01-10 DIAGNOSIS — G8250 Quadriplegia, unspecified: Secondary | ICD-10-CM | POA: Diagnosis not present

## 2020-01-10 DIAGNOSIS — Z801 Family history of malignant neoplasm of trachea, bronchus and lung: Secondary | ICD-10-CM | POA: Diagnosis not present

## 2020-01-10 DIAGNOSIS — M542 Cervicalgia: Secondary | ICD-10-CM | POA: Insufficient documentation

## 2020-01-10 DIAGNOSIS — C641 Malignant neoplasm of right kidney, except renal pelvis: Secondary | ICD-10-CM | POA: Diagnosis not present

## 2020-01-10 DIAGNOSIS — I10 Essential (primary) hypertension: Secondary | ICD-10-CM | POA: Diagnosis present

## 2020-01-10 DIAGNOSIS — Z85528 Personal history of other malignant neoplasm of kidney: Secondary | ICD-10-CM | POA: Diagnosis not present

## 2020-01-10 DIAGNOSIS — G8929 Other chronic pain: Secondary | ICD-10-CM | POA: Diagnosis not present

## 2020-01-10 DIAGNOSIS — F17200 Nicotine dependence, unspecified, uncomplicated: Secondary | ICD-10-CM

## 2020-01-10 DIAGNOSIS — Z8249 Family history of ischemic heart disease and other diseases of the circulatory system: Secondary | ICD-10-CM | POA: Insufficient documentation

## 2020-01-10 DIAGNOSIS — Z79899 Other long term (current) drug therapy: Secondary | ICD-10-CM | POA: Diagnosis not present

## 2020-01-10 NOTE — Progress Notes (Signed)
Pt states he is having pain in his right shoulder and hip

## 2020-01-10 NOTE — Progress Notes (Signed)
Patient ID: Patrick Romero, male    DOB: [birth year]  MRN: [id number]  CC: Hypertension   Subjective: Patrick Romero is a 59 y.o. 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  HM: Had 1st Moderna COVID vaccine but does not have card with him today.  CP:  No further CP.  Saw cardiology.  No further work-up planned at this time.  Quadriparesis: gets Botox inj from PMR Dr. Naaman Plummer. Pain relief last 2-4 hrs.  Has aches and spasms in back, RT arm and leg.  He is still on baclofen, gabapentin and Elavil.  He would like to get back into see Dr. Mohammed [facility] home he was seen last year for pain management and was being prescribed hydrocodone.  He had stopped going after his girlfriend was diagnosed with stage IV lung cancer.  His girlfriend died 19 months ago.  He would also like to get back in with [facility] for some physical therapy.  He was seeing them last year prior to the pandemic.    History of prostate CA and renal cell cancer: Passing his urine okay.  He is followed at [facility] urology by Dr. Tammi Klippel.  Next appointment with him is mid August.  HTN: Reports compliance with amlodipine and low-salt diet.  No chest pains or shortness of breath.  Tob dep: He is smoking about 2 packs of cigarettes per week.  States that he is not quite ready to put them down.  He is aware of health risks associated with smoking.    HL: He is taking and tolerating atorvastatin  ETOH:  Drinks 3 12 oz cans of beer every 2-3 days Patient Active Problem List   Diagnosis Date Noted  . 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 Spastic hemiparesis of right dominant side ([place]) 06/12/2019  .  Spasticity 03/27/2019  . Depression 05/29/2018  . Prostate cancer ([place]) 09/13/2017  . Hypertension 02/17/2017  . Renal carcinoma, right ([place]) 11/23/2016  . Atherosclerosis of aorta ([place]) 11/23/2016  . 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 Dysesthesia   . History of syncope   . Tetraparesis ([place])   . Central cord syndrome ([place])   . Anemia   . ETOH abuse   . Neuropathic pain   . Neck pain   . Chronic neck and back pain 09/01/2016  . Alcohol intoxication ([place]) 09/01/2016  . Tobacco abuse 09/01/2016     Current Outpatient Medications on File Prior to Visit  Medication Sig Dispense Refill  . amitriptyline (ELAVIL) 25 MG tablet Take 1 tablet (25 mg total) by mouth at bedtime. 30 tablet 1  . amLODipine (NORVASC) 5 MG tablet TAKE 1 TABLET BY MOUTH EVERY DAY 30 tablet 0  . aspirin EC 81 MG tablet Take 1 tablet (81 mg total) by mouth daily. 100 tablet 0  . atorvastatin (LIPITOR) 10 MG tablet Take 1 tablet (10 mg total) by mouth daily. 30 tablet 6  . baclofen (LIORESAL) 20 MG tablet Take 1 tablet (20 mg total) by mouth 4 (four) times daily. 120 tablet 1  . diclofenac (VOLTAREN) 50 MG EC tablet Take 1 tablet (50 mg total) by mouth 2 (two) times daily with a meal. 60 tablet 3  . gabapentin (NEURONTIN) 300 MG  capsule TAKE 3 CAPSULES BY MOUTH 3 TIMES A DAY 270 capsule 2  . NARCAN 4 MG/0.1ML LIQD nasal spray kit As needed    . nicotine (NICODERM CQ - DOSED IN MG/24 HOURS) 21 mg/24hr patch Place 1 patch (21 mg total) onto the skin daily. (Patient taking differently: Place 21 mg onto the skin 2 (two) times a week. ) 28 patch 1  . tiZANidine (ZANAFLEX) 2 MG tablet Take 1-2 tablets (2-4 mg total) by mouth 4 (four) times daily. (Take 2 at bedtime) 150 tablet 5   No current facility-administered medications on file prior to visit.    No Known Allergies  Social History   Socioeconomic History  . Marital status: Single    Spouse name: Not on file  . Number of children: Not on file    . Years of education: Not on file  . Highest education level: Not on file  Occupational History    Comment: disabled  Tobacco Use  . Smoking status: Current Every Day Smoker    Packs/day: 0.25    Types: Cigarettes  . Smokeless tobacco: Never Used  . Tobacco comment: pt is also using nicotine patch  Vaping Use  . Vaping Use: Never used  Substance and Sexual Activity  . Alcohol use: Yes    Comment: occ beer   . Drug use: No  . Sexual activity: Yes  Other Topics Concern  . Not on file  Social History Narrative   Lives with mother, son, sister   Caffeine- none   Social Determinants of Health   Financial Resource Strain:   . Difficulty of Paying Living Expenses:   Food Insecurity:   . Worried About Charity fundraiser in the Last Year:   . Arboriculturist in the Last Year:   Transportation Needs:   . Film/video editor (Medical):   Marland Kitchen Lack of Transportation (Non-Medical):   Physical Activity:   . Days of Exercise per Week:   . Minutes of Exercise per Session:   Stress:   . Feeling of Stress :   Social Connections:   . Frequency of Communication with Friends and Family:   . Frequency of Social Gatherings with Friends and Family:   . Attends Religious Services:   . Active Member of Clubs or Organizations:   . Attends Archivist Meetings:   Marland Kitchen Marital Status:   Intimate Partner Violence:   . Fear of Current or Ex-Partner:   . Emotionally Abused:   Marland Kitchen Physically Abused:   . Sexually Abused:     Family History  Problem Relation Age of Onset  . Hypertension Mother   . Lung cancer Father   . COPD Sister     Past Surgical History:  Procedure Laterality Date  . ROBOT ASSISTED LAPAROSCOPIC RADICAL PROSTATECTOMY N/A 09/13/2017   Procedure: XI ROBOTIC ASSISTED LAPAROSCOPIC RADICAL PROSTATECTOMY, BILATERAL PELVIC LYMPH NODE DISSECTION;  Surgeon: [name], MD;  Location: [facility];  Service: Urology;  Laterality: N/A;  . ROBOTIC ASSITED PARTIAL NEPHRECTOMY  Right 01/04/2017   Procedure: XI ROBOTIC ASSITED PARTIAL NEPHRECTOMY;  Surgeon: [name], MD;  Location: [facility];  Service: Urology;  Laterality: Right;    ROS: Review of Systems Negative except as stated above  PHYSICAL EXAM: BP 133/83   Pulse (!) 115   Temp 98.8 F (37.1 C)   Resp 16   Wt 146 lb (66.2 kg)   SpO2 95%   BMI 19.80 kg/m   Wt Readings from Last  3 Encounters:  01/10/20 146 lb (66.2 kg)  11/13/19 148 lb (67.1 kg)  10/28/19 150 lb 1.9 oz (68.1 kg)    Physical Exam   General appearance - alert, well appearing, middle-aged older African-American male and in no distress. Mental status - normal mood, behavior, speech, dress, motor activity, and thought processes Mouth - mucous membranes moist, pharynx normal without lesions Neck -no cervical lymphadenopathy.  No thyromegaly.   Chest -breath sounds slightly decreased bilaterally but without wheezes crackles or rhonchi. Heart - normal rate, regular rhythm, normal S1, S2, no murmurs, rubs, clicks or gallops Neurological -patient ambulates with a cane.  He has a slow gait with poor foot to floor clearance.  He has weakness in all 4 extremities that is worse on the right side.  Power in the right upper and lower extremity 3+/5.  In the left upper and lower extremity 4/5 Extremities -no lower extremity edema.  CMP Latest Ref Rng & Units 08/23/2018 09/14/2017 09/08/2017  Glucose 65 - 99 mg/dL 90 132(H) 92  BUN 6 - 24 mg/dL '9 16 9  ' Creatinine 0.76 - 1.27 mg/dL 1.28(H) 1.19 1.01  Sodium 134 - 144 mmol/L 140 132(L) 138  Potassium 3.5 - 5.2 mmol/L 4.8 6.2(H) 4.1  Chloride 96 - 106 mmol/L 103 106 105  CO2 20 - 29 mmol/L 22 20(L) 22  Calcium 8.7 - 10.2 mg/dL 9.6 8.4(L) 9.3  Total Protein 6.0 - 8.5 g/dL 7.8 - -  Total Bilirubin 0.0 - 1.2 mg/dL 0.6 - -  Alkaline Phos 39 - 117 IU/L 78 - -  AST 0 - 40 IU/L 17 - -  ALT 0 - 44 IU/L 16 - -   Lipid Panel     Component Value Date/Time   CHOL 177 08/23/2018 1052   TRIG 163 (H)  08/23/2018 1052   HDL 45 08/23/2018 1052   CHOLHDL 3.9 08/23/2018 1052   LDLCALC 99 08/23/2018 1052    CBC    Component Value Date/Time   WBC 6.7 08/23/2018 1052   WBC 6.3 09/08/2017 1325   RBC 4.68 08/23/2018 1052   RBC 4.28 09/08/2017 1325   HGB 12.0 (L) 08/23/2018 1052   HCT 38.3 08/23/2018 1052   PLT 479 (H) 08/23/2018 1052   MCV 82 08/23/2018 1052   MCH 25.6 (L) 08/23/2018 1052   MCH 25.7 (L) 09/08/2017 1325   MCHC 31.3 (L) 08/23/2018 1052   MCHC 31.7 09/08/2017 1325   RDW 13.9 08/23/2018 1052   LYMPHSABS 2.2 02/22/2017 1528   LYMPHSABS 3.0 02/17/2017 1427   MONOABS 0.5 02/22/2017 1528   EOSABS 0.2 02/22/2017 1528   EOSABS 0.3 02/17/2017 1427   BASOSABS 0.0 02/22/2017 1528   BASOSABS 0.0 02/17/2017 1427    ASSESSMENT AND PLAN: 1. Essential hypertension Close to goal.  Continue amlodipine and low-salt diet - CBC - Comprehensive metabolic panel  2. Quadriparesis [facility]) I will refer him to physical therapy per his request for some strengthening.  He is followed by PMR Dr. Tessa Lerner. - Ambulatory referral to Physical Therapy  3. Alcohol use disorder, mild, in controlled environment Advised patient to try not to drink more than 2 beers in 1 setting or in a day.  4. Renal cell adenocarcinoma, right ([place]) [facility]  5. Tobacco dependence Strongly advised to quit.  Discussed health risks associated with smoking.  Patient not ready to give a trial of quitting.  Less than 5 minutes spent on counseling.  6. Pure hypercholesterolemia Continue atorvastatin - Lipid  panel  7. Chronic neck and back pain We will refer him back to Dr. Primus Bravo - Ambulatory referral to Pain Clinic  We will schedule him with the nurse practitioner in 2 weeks to come and have his welcome to Medicare wellness visit.  Patient tells me he has had Medicare for less than 1 year.   Patient was given the opportunity to ask questions.  Patient verbalized understanding of the plan  and was able to repeat key elements of the plan.   Orders Placed This Encounter  Procedures  . CBC  . Comprehensive metabolic panel  . Lipid panel  . Ambulatory referral to Physical Therapy  . Ambulatory referral to Pain Clinic     Requested Prescriptions    No prescriptions requested or ordered in this encounter    Return in about 4 months (around 05/12/2020) for Amy Minette Brine in 2 wks for Welcome to Medicare.  Karle Plumber, MD, FACP

## 2020-01-11 LAB — LIPID PANEL
Chol/HDL Ratio: 2.5 ratio (ref 0.0–5.0)
Cholesterol, Total: 169 mg/dL (ref 100–199)
HDL: 67 mg/dL (ref >39)
LDL Chol Calc (NIH): 61 mg/dL (ref 0–99)
Triglycerides: 264 mg/dL — ABNORMAL HIGH (ref 0–149)
VLDL Cholesterol Cal: 41 mg/dL — ABNORMAL HIGH (ref 5–40)

## 2020-01-11 LAB — CBC
Hematocrit: 33.9 % — ABNORMAL LOW (ref 37.5–51.0)
Hemoglobin: 11 g/dL — ABNORMAL LOW (ref 13.0–17.7)
MCH: 26.4 pg — ABNORMAL LOW (ref 26.6–33.0)
MCHC: 32.4 g/dL (ref 31.5–35.7)
MCV: 81 fL (ref 79–97)
Platelets: 332 10*3/uL (ref 150–450)
RBC: 4.17 x10E6/uL (ref 4.14–5.80)
RDW: 13.1 % (ref 11.6–15.4)
WBC: 6.2 10*3/uL (ref 3.4–10.8)

## 2020-01-11 LAB — COMPREHENSIVE METABOLIC PANEL
ALT: 16 IU/L (ref 0–44)
AST: 19 IU/L (ref 0–40)
Albumin/Globulin Ratio: 1.4 (ref 1.2–2.2)
Albumin: 4.4 g/dL (ref 3.8–4.9)
Alkaline Phosphatase: 64 IU/L (ref 48–121)
BUN/Creatinine Ratio: 17 (ref 9–20)
BUN: 17 mg/dL (ref 6–24)
Bilirubin Total: 0.3 mg/dL (ref 0.0–1.2)
CO2: 22 mmol/L (ref 20–29)
Calcium: 9.4 mg/dL (ref 8.7–10.2)
Chloride: 108 mmol/L — ABNORMAL HIGH (ref 96–106)
Creatinine, Ser: 1.01 mg/dL (ref 0.76–1.27)
GFR calc Af Amer: 94 mL/min/{1.73_m2} (ref >59)
GFR calc non Af Amer: 81 mL/min/{1.73_m2} (ref >59)
Globulin, Total: 3.2 g/dL (ref 1.5–4.5)
Glucose: 74 mg/dL (ref 65–99)
Potassium: 4.3 mmol/L (ref 3.5–5.2)
Sodium: 144 mmol/L (ref 134–144)
Total Protein: 7.6 g/dL (ref 6.0–8.5)

## 2020-01-15 ENCOUNTER — Telehealth: Payer: Self-pay

## 2020-01-15 DIAGNOSIS — D649 Anemia, unspecified: Secondary | ICD-10-CM

## 2020-01-15 NOTE — Telephone Encounter (Signed)
Contacted pt to go over lab results pt is aware and doesn't have any questions or concerns   Dr. Wynetta Emery pt states he has been taking a multivitamin with iron

## 2020-01-16 NOTE — Addendum Note (Signed)
Addended by: Karle Plumber B on: 01/16/2020 10:03 PM   Modules accepted: Orders

## 2020-01-17 NOTE — Telephone Encounter (Signed)
Contacted pt to go over Dr. Merton Border pt is aware and will come by on Wednesday to get iron level drawn. Pt doesn't have any questions or concerns

## 2020-01-28 ENCOUNTER — Ambulatory Visit (HOSPITAL_BASED_OUTPATIENT_CLINIC_OR_DEPARTMENT_OTHER): Payer: Medicare Other | Admitting: Family

## 2020-01-28 DIAGNOSIS — Z5329 Procedure and treatment not carried out because of patient's decision for other reasons: Secondary | ICD-10-CM

## 2020-01-28 NOTE — Progress Notes (Signed)
Patient did not show for appointment.   

## 2020-01-29 ENCOUNTER — Other Ambulatory Visit: Payer: Self-pay | Admitting: Physical Medicine & Rehabilitation

## 2020-01-29 DIAGNOSIS — S14129S Central cord syndrome at unspecified level of cervical spinal cord, sequela: Secondary | ICD-10-CM

## 2020-02-12 ENCOUNTER — Other Ambulatory Visit: Payer: Self-pay

## 2020-02-12 ENCOUNTER — Encounter: Payer: Medicare Other | Attending: Physical Medicine & Rehabilitation | Admitting: Physical Medicine & Rehabilitation

## 2020-02-12 ENCOUNTER — Encounter: Payer: Self-pay | Admitting: Physical Medicine & Rehabilitation

## 2020-02-12 VITALS — BP 115/76 | HR 92 | Temp 99.0°F | Ht 72.0 in | Wt 145.0 lb

## 2020-02-12 DIAGNOSIS — C61 Malignant neoplasm of prostate: Secondary | ICD-10-CM | POA: Diagnosis not present

## 2020-02-12 DIAGNOSIS — G8111 Spastic hemiplegia affecting right dominant side: Secondary | ICD-10-CM | POA: Diagnosis not present

## 2020-02-12 DIAGNOSIS — D573 Sickle-cell trait: Secondary | ICD-10-CM | POA: Insufficient documentation

## 2020-02-12 DIAGNOSIS — F1010 Alcohol abuse, uncomplicated: Secondary | ICD-10-CM | POA: Insufficient documentation

## 2020-02-12 DIAGNOSIS — Z8249 Family history of ischemic heart disease and other diseases of the circulatory system: Secondary | ICD-10-CM | POA: Insufficient documentation

## 2020-02-12 DIAGNOSIS — M25511 Pain in right shoulder: Secondary | ICD-10-CM | POA: Insufficient documentation

## 2020-02-12 DIAGNOSIS — F17210 Nicotine dependence, cigarettes, uncomplicated: Secondary | ICD-10-CM | POA: Diagnosis not present

## 2020-02-12 DIAGNOSIS — I10 Essential (primary) hypertension: Secondary | ICD-10-CM | POA: Insufficient documentation

## 2020-02-12 DIAGNOSIS — M779 Enthesopathy, unspecified: Secondary | ICD-10-CM | POA: Insufficient documentation

## 2020-02-12 DIAGNOSIS — M1990 Unspecified osteoarthritis, unspecified site: Secondary | ICD-10-CM | POA: Insufficient documentation

## 2020-02-12 DIAGNOSIS — R252 Cramp and spasm: Secondary | ICD-10-CM | POA: Diagnosis not present

## 2020-02-12 DIAGNOSIS — Z9079 Acquired absence of other genital organ(s): Secondary | ICD-10-CM | POA: Insufficient documentation

## 2020-02-12 DIAGNOSIS — C641 Malignant neoplasm of right kidney, except renal pelvis: Secondary | ICD-10-CM | POA: Insufficient documentation

## 2020-02-12 DIAGNOSIS — Z801 Family history of malignant neoplasm of trachea, bronchus and lung: Secondary | ICD-10-CM | POA: Diagnosis not present

## 2020-02-12 DIAGNOSIS — Z8673 Personal history of transient ischemic attack (TIA), and cerebral infarction without residual deficits: Secondary | ICD-10-CM | POA: Diagnosis not present

## 2020-02-12 DIAGNOSIS — G8252 Quadriplegia, C1-C4 incomplete: Secondary | ICD-10-CM | POA: Diagnosis present

## 2020-02-12 DIAGNOSIS — S14129S Central cord syndrome at unspecified level of cervical spinal cord, sequela: Secondary | ICD-10-CM

## 2020-02-12 DIAGNOSIS — M7501 Adhesive capsulitis of right shoulder: Secondary | ICD-10-CM

## 2020-02-12 DIAGNOSIS — Z905 Acquired absence of kidney: Secondary | ICD-10-CM | POA: Diagnosis not present
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
## 2020-02-12 NOTE — Patient Instructions (Signed)
PLEASE FEEL FREE TO CALL OUR OFFICE WITH ANY PROBLEMS OR QUESTIONS ([phone])     WORK ON YOUR STRETCHING EVERY DAY!

## 2020-02-20 ENCOUNTER — Ambulatory Visit: Payer: Medicare Other | Admitting: Family

## 2020-03-04 ENCOUNTER — Other Ambulatory Visit: Payer: Self-pay | Admitting: Physical Medicine & Rehabilitation

## 2020-03-04 DIAGNOSIS — S14129S Central cord syndrome at unspecified level of cervical spinal cord, sequela: Secondary | ICD-10-CM

## 2020-03-12 ENCOUNTER — Other Ambulatory Visit: Payer: Self-pay | Admitting: Physical Medicine & Rehabilitation

## 2020-03-12 DIAGNOSIS — M79609 Pain in unspecified limb: Secondary | ICD-10-CM

## 2020-03-23 ENCOUNTER — Other Ambulatory Visit: Payer: Self-pay

## 2020-03-23 ENCOUNTER — Emergency Department (HOSPITAL_COMMUNITY): Payer: Medicare Other

## 2020-03-23 ENCOUNTER — Encounter (HOSPITAL_COMMUNITY): Payer: Self-pay | Admitting: Emergency Medicine

## 2020-03-23 ENCOUNTER — Inpatient Hospital Stay (HOSPITAL_COMMUNITY)
Admission: EM | Admit: 2020-03-23 | Discharge: 2020-03-25 | DRG: 177 | Disposition: A | Discharge status: Home / Self Care | Payer: Medicare Other | Attending: Family Medicine | Admitting: Family Medicine

## 2020-03-23 DIAGNOSIS — R0902 Hypoxemia: Secondary | ICD-10-CM

## 2020-03-23 DIAGNOSIS — Z85528 Personal history of other malignant neoplasm of kidney: Secondary | ICD-10-CM

## 2020-03-23 DIAGNOSIS — F17210 Nicotine dependence, cigarettes, uncomplicated: Secondary | ICD-10-CM | POA: Diagnosis present

## 2020-03-23 DIAGNOSIS — G8929 Other chronic pain: Secondary | ICD-10-CM | POA: Diagnosis present

## 2020-03-23 DIAGNOSIS — Z8249 Family history of ischemic heart disease and other diseases of the circulatory system: Secondary | ICD-10-CM

## 2020-03-23 DIAGNOSIS — Z801 Family history of malignant neoplasm of trachea, bronchus and lung: Secondary | ICD-10-CM

## 2020-03-23 DIAGNOSIS — D638 Anemia in other chronic diseases classified elsewhere: Secondary | ICD-10-CM | POA: Diagnosis present

## 2020-03-23 DIAGNOSIS — U071 COVID-19: Secondary | ICD-10-CM | POA: Diagnosis not present

## 2020-03-23 DIAGNOSIS — E871 Hypo-osmolality and hyponatremia: Secondary | ICD-10-CM | POA: Diagnosis present

## 2020-03-23 DIAGNOSIS — Z905 Acquired absence of kidney: Secondary | ICD-10-CM

## 2020-03-23 DIAGNOSIS — Z8546 Personal history of malignant neoplasm of prostate: Secondary | ICD-10-CM

## 2020-03-23 DIAGNOSIS — I10 Essential (primary) hypertension: Secondary | ICD-10-CM | POA: Diagnosis present

## 2020-03-23 DIAGNOSIS — Z9079 Acquired absence of other genital organ(s): Secondary | ICD-10-CM

## 2020-03-23 DIAGNOSIS — N179 Acute kidney failure, unspecified: Secondary | ICD-10-CM | POA: Diagnosis present

## 2020-03-23 DIAGNOSIS — Z79899 Other long term (current) drug therapy: Secondary | ICD-10-CM

## 2020-03-23 DIAGNOSIS — Z8673 Personal history of transient ischemic attack (TIA), and cerebral infarction without residual deficits: Secondary | ICD-10-CM

## 2020-03-23 DIAGNOSIS — R7401 Elevation of levels of liver transaminase levels: Secondary | ICD-10-CM | POA: Diagnosis present

## 2020-03-23 DIAGNOSIS — D72819 Decreased white blood cell count, unspecified: Secondary | ICD-10-CM | POA: Diagnosis present

## 2020-03-23 DIAGNOSIS — Z825 Family history of asthma and other chronic lower respiratory diseases: Secondary | ICD-10-CM

## 2020-03-23 DIAGNOSIS — M549 Dorsalgia, unspecified: Secondary | ICD-10-CM | POA: Diagnosis present

## 2020-03-23 DIAGNOSIS — J9601 Acute respiratory failure with hypoxia: Secondary | ICD-10-CM | POA: Diagnosis present

## 2020-03-23 DIAGNOSIS — D573 Sickle-cell trait: Secondary | ICD-10-CM | POA: Diagnosis present

## 2020-03-23 DIAGNOSIS — Z9181 History of falling: Secondary | ICD-10-CM

## 2020-03-23 DIAGNOSIS — J1282 Pneumonia due to coronavirus disease 2019: Secondary | ICD-10-CM

## 2020-03-23 DIAGNOSIS — G8250 Quadriplegia, unspecified: Secondary | ICD-10-CM | POA: Diagnosis present

## 2020-03-23 LAB — CBC WITH DIFFERENTIAL/PLATELET
Abs Immature Granulocytes: 0 10*3/uL (ref 0.00–0.07)
Basophils Absolute: 0 10*3/uL (ref 0.0–0.1)
Basophils Relative: 0 %
Eosinophils Absolute: 0 10*3/uL (ref 0.0–0.5)
Eosinophils Relative: 0 %
HCT: 34 % — ABNORMAL LOW (ref 39.0–52.0)
Hemoglobin: 10.7 g/dL — ABNORMAL LOW (ref 13.0–17.0)
Lymphocytes Relative: 9 %
Lymphs Abs: 0.6 10*3/uL — ABNORMAL LOW (ref 0.7–4.0)
MCH: 25.7 pg — ABNORMAL LOW (ref 26.0–34.0)
MCHC: 31.5 g/dL (ref 30.0–36.0)
MCV: 81.5 fL (ref 80.0–100.0)
Monocytes Absolute: 0.4 10*3/uL (ref 0.1–1.0)
Monocytes Relative: 7 %
Neutro Abs: 5.2 10*3/uL (ref 1.7–7.7)
Neutrophils Relative %: 84 %
Platelets: 279 10*3/uL (ref 150–400)
RBC: 4.17 MIL/uL — ABNORMAL LOW (ref 4.22–5.81)
RDW: 12.6 % (ref 11.5–15.5)
WBC: 6.2 10*3/uL (ref 4.0–10.5)
nRBC: 0 /100 WBC
nRBC: 0.3 % — ABNORMAL HIGH (ref 0.0–0.2)

## 2020-03-23 LAB — BILIRUBIN, FRACTIONATED(TOT/DIR/INDIR)
Bilirubin, Direct: 0.5 mg/dL — ABNORMAL HIGH (ref 0.0–0.2)
Indirect Bilirubin: 1.3 mg/dL — ABNORMAL HIGH (ref 0.3–0.9)
Total Bilirubin: 1.8 mg/dL — ABNORMAL HIGH (ref 0.3–1.2)

## 2020-03-23 LAB — COMPREHENSIVE METABOLIC PANEL
ALT: 210 U/L — ABNORMAL HIGH (ref 0–44)
AST: 364 U/L — ABNORMAL HIGH (ref 15–41)
Albumin: 3.3 g/dL — ABNORMAL LOW (ref 3.5–5.0)
Alkaline Phosphatase: 104 U/L (ref 38–126)
Anion gap: 15 (ref 5–15)
BUN: 30 mg/dL — ABNORMAL HIGH (ref 6–20)
CO2: 20 mmol/L — ABNORMAL LOW (ref 22–32)
Calcium: 8.9 mg/dL (ref 8.9–10.3)
Chloride: 97 mmol/L — ABNORMAL LOW (ref 98–111)
Creatinine, Ser: 1.56 mg/dL — ABNORMAL HIGH (ref 0.61–1.24)
GFR calc Af Amer: 56 mL/min — ABNORMAL LOW (ref >60)
GFR calc non Af Amer: 48 mL/min — ABNORMAL LOW (ref >60)
Glucose, Bld: 110 mg/dL — ABNORMAL HIGH (ref 70–99)
Potassium: 4.2 mmol/L (ref 3.5–5.1)
Sodium: 132 mmol/L — ABNORMAL LOW (ref 135–145)
Total Bilirubin: 1.7 mg/dL — ABNORMAL HIGH (ref 0.3–1.2)
Total Protein: 7.4 g/dL (ref 6.5–8.1)

## 2020-03-23 LAB — LACTATE DEHYDROGENASE: LDH: 1639 U/L — ABNORMAL HIGH (ref 98–192)

## 2020-03-23 LAB — C-REACTIVE PROTEIN: CRP: 10.9 mg/dL — ABNORMAL HIGH (ref <1.0)

## 2020-03-23 LAB — D-DIMER, QUANTITATIVE: D-Dimer, Quant: >20 ug/mL-FEU — ABNORMAL HIGH (ref 0.00–0.50)

## 2020-03-23 LAB — LACTIC ACID, PLASMA: Lactic Acid, Venous: 1.7 mmol/L (ref 0.5–1.9)

## 2020-03-23 LAB — TRIGLYCERIDES: Triglycerides: 211 mg/dL — ABNORMAL HIGH (ref <150)

## 2020-03-23 LAB — SARS CORONAVIRUS 2 BY RT PCR (HOSPITAL ORDER, PERFORMED IN ~~LOC~~ HOSPITAL LAB): SARS Coronavirus 2: POSITIVE — AB

## 2020-03-23 LAB — FERRITIN: Ferritin: >7500 ng/mL — ABNORMAL HIGH (ref 24–336)

## 2020-03-23 LAB — PROCALCITONIN: Procalcitonin: 0.41 ng/mL

## 2020-03-23 LAB — FIBRINOGEN: Fibrinogen: 657 mg/dL — ABNORMAL HIGH (ref 210–475)

## 2020-03-23 MED ORDER — DEXAMETHASONE SODIUM PHOSPHATE 10 MG/ML IJ SOLN
6.0000 mg | Freq: Once | INTRAMUSCULAR | Status: AC
  Administered 2020-03-23: 6 mg via INTRAVENOUS
  Filled 2020-03-23: qty 1

## 2020-03-23 MED ORDER — ALBUTEROL SULFATE HFA 108 (90 BASE) MCG/ACT IN AERS
4.0000 | INHALATION_SPRAY | Freq: Once | RESPIRATORY_TRACT | Status: AC
  Administered 2020-03-23: 4 via RESPIRATORY_TRACT
  Filled 2020-03-23: qty 6.7

## 2020-03-23 MED ORDER — ASPIRIN EC 81 MG PO TBEC
81.0000 mg | DELAYED_RELEASE_TABLET | Freq: Every day | ORAL | Status: DC
  Administered 2020-03-24 – 2020-03-25 (×2): 81 mg via ORAL
  Filled 2020-03-23 (×2): qty 1

## 2020-03-23 MED ORDER — SODIUM CHLORIDE 0.9 % IV SOLN: INTRAVENOUS | Status: AC

## 2020-03-23 MED ORDER — ACETAMINOPHEN 325 MG PO TABS
650.0000 mg | ORAL_TABLET | Freq: Four times a day (QID) | ORAL | Status: DC | PRN
  Administered 2020-03-25: 650 mg via ORAL
  Filled 2020-03-23: qty 2

## 2020-03-23 MED ORDER — NICOTINE 14 MG/24HR TD PT24
14.0000 mg | MEDICATED_PATCH | Freq: Every day | TRANSDERMAL | Status: DC
  Administered 2020-03-24 – 2020-03-25 (×2): 14 mg via TRANSDERMAL
  Filled 2020-03-23 (×2): qty 1

## 2020-03-23 MED ORDER — ACETAMINOPHEN 500 MG PO TABS
1000.0000 mg | ORAL_TABLET | Freq: Once | ORAL | Status: AC
  Administered 2020-03-23: 1000 mg via ORAL
  Filled 2020-03-23: qty 2

## 2020-03-23 MED ORDER — SODIUM CHLORIDE 0.9 % IV SOLN
200.0000 mg | Freq: Once | INTRAVENOUS | Status: AC
  Administered 2020-03-23: 200 mg via INTRAVENOUS
  Filled 2020-03-23: qty 40

## 2020-03-23 MED ORDER — POLYETHYLENE GLYCOL 3350 17 G PO PACK: 17.0000 g | PACK | Freq: Every day | ORAL | Status: DC | PRN

## 2020-03-23 MED ORDER — IOHEXOL 300 MG/ML  SOLN
60.0000 mL | Freq: Once | INTRAMUSCULAR | Status: AC | PRN
  Administered 2020-03-23: 60 mL via INTRAVENOUS

## 2020-03-23 MED ORDER — GABAPENTIN 100 MG PO CAPS
100.0000 mg | ORAL_CAPSULE | Freq: Three times a day (TID) | ORAL | Status: DC | PRN
  Filled 2020-03-23: qty 1

## 2020-03-23 MED ORDER — DEXAMETHASONE 4 MG PO TABS
6.0000 mg | ORAL_TABLET | Freq: Every day | ORAL | Status: DC
  Administered 2020-03-23 – 2020-03-25 (×3): 6 mg via ORAL
  Filled 2020-03-23 (×3): qty 2

## 2020-03-23 MED ORDER — SODIUM CHLORIDE 0.9 % IV SOLN
100.0000 mg | Freq: Every day | INTRAVENOUS | Status: DC
  Administered 2020-03-24 – 2020-03-25 (×2): 100 mg via INTRAVENOUS
  Filled 2020-03-23 (×3): qty 20

## 2020-03-23 MED ORDER — ATORVASTATIN CALCIUM 10 MG PO TABS
10.0000 mg | ORAL_TABLET | Freq: Every day | ORAL | Status: DC
  Administered 2020-03-24 – 2020-03-25 (×2): 10 mg via ORAL
  Filled 2020-03-23 (×2): qty 1

## 2020-03-23 MED ORDER — AMLODIPINE BESYLATE 5 MG PO TABS
5.0000 mg | ORAL_TABLET | Freq: Every day | ORAL | Status: DC
  Administered 2020-03-24 – 2020-03-25 (×2): 5 mg via ORAL
  Filled 2020-03-23 (×2): qty 1

## 2020-03-23 MED ORDER — ENOXAPARIN SODIUM 40 MG/0.4ML ~~LOC~~ SOLN
40.0000 mg | SUBCUTANEOUS | Status: DC
  Administered 2020-03-23 – 2020-03-25 (×3): 40 mg via SUBCUTANEOUS
  Filled 2020-03-23 (×3): qty 0.4

## 2020-03-23 NOTE — ED Triage Notes (Signed)
Pt c/o SOB, persistent cough and fever for the past few days, tested for Covid 19 03/13/2020 with no results yet., no covid vaccinated. 88% on RA placed on 2 L up to 97%, HR 101, R-32 cbg 107. BP 125/86.
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
## 2020-03-24 ENCOUNTER — Encounter (HOSPITAL_COMMUNITY): Payer: Self-pay | Admitting: Family Medicine

## 2020-03-24 DIAGNOSIS — Z8249 Family history of ischemic heart disease and other diseases of the circulatory system: Secondary | ICD-10-CM | POA: Diagnosis not present

## 2020-03-24 DIAGNOSIS — I10 Essential (primary) hypertension: Secondary | ICD-10-CM | POA: Diagnosis present

## 2020-03-24 DIAGNOSIS — Z801 Family history of malignant neoplasm of trachea, bronchus and lung: Secondary | ICD-10-CM | POA: Diagnosis not present

## 2020-03-24 DIAGNOSIS — J9601 Acute respiratory failure with hypoxia: Secondary | ICD-10-CM | POA: Diagnosis present

## 2020-03-24 DIAGNOSIS — G8929 Other chronic pain: Secondary | ICD-10-CM | POA: Diagnosis present

## 2020-03-24 DIAGNOSIS — J1282 Pneumonia due to coronavirus disease 2019: Secondary | ICD-10-CM | POA: Diagnosis present

## 2020-03-24 DIAGNOSIS — Z79899 Other long term (current) drug therapy: Secondary | ICD-10-CM | POA: Diagnosis not present

## 2020-03-24 DIAGNOSIS — D638 Anemia in other chronic diseases classified elsewhere: Secondary | ICD-10-CM | POA: Diagnosis present

## 2020-03-24 DIAGNOSIS — Z8546 Personal history of malignant neoplasm of prostate: Secondary | ICD-10-CM | POA: Diagnosis not present

## 2020-03-24 DIAGNOSIS — M549 Dorsalgia, unspecified: Secondary | ICD-10-CM | POA: Diagnosis present

## 2020-03-24 DIAGNOSIS — R7401 Elevation of levels of liver transaminase levels: Secondary | ICD-10-CM | POA: Diagnosis present

## 2020-03-24 DIAGNOSIS — Z8673 Personal history of transient ischemic attack (TIA), and cerebral infarction without residual deficits: Secondary | ICD-10-CM | POA: Diagnosis not present

## 2020-03-24 DIAGNOSIS — Z905 Acquired absence of kidney: Secondary | ICD-10-CM | POA: Diagnosis not present

## 2020-03-24 DIAGNOSIS — G8250 Quadriplegia, unspecified: Secondary | ICD-10-CM | POA: Diagnosis present

## 2020-03-24 DIAGNOSIS — D72819 Decreased white blood cell count, unspecified: Secondary | ICD-10-CM | POA: Diagnosis present

## 2020-03-24 DIAGNOSIS — D573 Sickle-cell trait: Secondary | ICD-10-CM | POA: Diagnosis present

## 2020-03-24 DIAGNOSIS — E871 Hypo-osmolality and hyponatremia: Secondary | ICD-10-CM | POA: Diagnosis present

## 2020-03-24 DIAGNOSIS — U071 COVID-19: Secondary | ICD-10-CM | POA: Diagnosis present

## 2020-03-24 DIAGNOSIS — Z9079 Acquired absence of other genital organ(s): Secondary | ICD-10-CM | POA: Diagnosis not present

## 2020-03-24 DIAGNOSIS — R0902 Hypoxemia: Secondary | ICD-10-CM | POA: Diagnosis present

## 2020-03-24 DIAGNOSIS — Z825 Family history of asthma and other chronic lower respiratory diseases: Secondary | ICD-10-CM | POA: Diagnosis not present

## 2020-03-24 DIAGNOSIS — N179 Acute kidney failure, unspecified: Secondary | ICD-10-CM | POA: Diagnosis present

## 2020-03-24 DIAGNOSIS — F17210 Nicotine dependence, cigarettes, uncomplicated: Secondary | ICD-10-CM | POA: Diagnosis present

## 2020-03-24 DIAGNOSIS — Z9181 History of falling: Secondary | ICD-10-CM | POA: Diagnosis not present

## 2020-03-24 DIAGNOSIS — Z85528 Personal history of other malignant neoplasm of kidney: Secondary | ICD-10-CM | POA: Diagnosis not present

## 2020-03-24 LAB — CBC WITH DIFFERENTIAL/PLATELET
Abs Immature Granulocytes: 0.03 10*3/uL (ref 0.00–0.07)
Basophils Absolute: 0 10*3/uL (ref 0.0–0.1)
Basophils Relative: 0 %
Eosinophils Absolute: 0 10*3/uL (ref 0.0–0.5)
Eosinophils Relative: 0 %
HCT: 29 % — ABNORMAL LOW (ref 39.0–52.0)
Hemoglobin: 9 g/dL — ABNORMAL LOW (ref 13.0–17.0)
Immature Granulocytes: 1 %
Lymphocytes Relative: 27 %
Lymphs Abs: 1 10*3/uL (ref 0.7–4.0)
MCH: 24.9 pg — ABNORMAL LOW (ref 26.0–34.0)
MCHC: 31 g/dL (ref 30.0–36.0)
MCV: 80.3 fL (ref 80.0–100.0)
Monocytes Absolute: 0.7 10*3/uL (ref 0.1–1.0)
Monocytes Relative: 18 %
Neutro Abs: 2 10*3/uL (ref 1.7–7.7)
Neutrophils Relative %: 54 %
Platelets: 323 10*3/uL (ref 150–400)
RBC: 3.61 MIL/uL — ABNORMAL LOW (ref 4.22–5.81)
RDW: 12.6 % (ref 11.5–15.5)
WBC: 3.7 10*3/uL — ABNORMAL LOW (ref 4.0–10.5)
nRBC: 0 % (ref 0.0–0.2)

## 2020-03-24 LAB — COMPREHENSIVE METABOLIC PANEL
ALT: 158 U/L — ABNORMAL HIGH (ref 0–44)
AST: 186 U/L — ABNORMAL HIGH (ref 15–41)
Albumin: 2.9 g/dL — ABNORMAL LOW (ref 3.5–5.0)
Alkaline Phosphatase: 89 U/L (ref 38–126)
Anion gap: 13 (ref 5–15)
BUN: 28 mg/dL — ABNORMAL HIGH (ref 6–20)
CO2: 20 mmol/L — ABNORMAL LOW (ref 22–32)
Calcium: 8.8 mg/dL — ABNORMAL LOW (ref 8.9–10.3)
Chloride: 100 mmol/L (ref 98–111)
Creatinine, Ser: 1.12 mg/dL (ref 0.61–1.24)
GFR calc Af Amer: >60 mL/min (ref >60)
GFR calc non Af Amer: >60 mL/min (ref >60)
Glucose, Bld: 144 mg/dL — ABNORMAL HIGH (ref 70–99)
Potassium: 4.5 mmol/L (ref 3.5–5.1)
Sodium: 133 mmol/L — ABNORMAL LOW (ref 135–145)
Total Bilirubin: 1.7 mg/dL — ABNORMAL HIGH (ref 0.3–1.2)
Total Protein: 6.8 g/dL (ref 6.5–8.1)

## 2020-03-24 LAB — MAGNESIUM: Magnesium: 2.1 mg/dL (ref 1.7–2.4)

## 2020-03-24 LAB — HIV ANTIBODY (ROUTINE TESTING W REFLEX): HIV Screen 4th Generation wRfx: NONREACTIVE

## 2020-03-24 LAB — D-DIMER, QUANTITATIVE: D-Dimer, Quant: 11.44 ug/mL-FEU — ABNORMAL HIGH (ref 0.00–0.50)

## 2020-03-24 LAB — FERRITIN: Ferritin: >7500 ng/mL — ABNORMAL HIGH (ref 24–336)

## 2020-03-24 LAB — C-REACTIVE PROTEIN: CRP: 10.3 mg/dL — ABNORMAL HIGH (ref <1.0)

## 2020-03-24 MED ORDER — GABAPENTIN 300 MG PO CAPS
300.0000 mg | ORAL_CAPSULE | Freq: Three times a day (TID) | ORAL | Status: DC
  Administered 2020-03-24 – 2020-03-25 (×3): 300 mg via ORAL
  Filled 2020-03-24 (×3): qty 1

## 2020-03-24 MED ORDER — TIZANIDINE HCL 2 MG PO TABS
2.0000 mg | ORAL_TABLET | Freq: Four times a day (QID) | ORAL | Status: DC
  Administered 2020-03-24: 4 mg via ORAL
  Administered 2020-03-25 (×3): 2 mg via ORAL
  Filled 2020-03-24 (×5): qty 2

## 2020-03-24 NOTE — Progress Notes (Signed)
FPTS Interim Progress Note  S: Checked-in on patient per day team requests due to extremely elevated COVID labs and yellow MEWS score at beginning of shift. Patient states that he is doing well, no acute events and no change in his breathing difficulty.  O: BP (!) 131/91   Pulse 93   Temp 98.5 F (36.9 C) (Oral)   Resp 20   Ht 6' (1.829 m)   Wt 65.8 kg   SpO2 92%   BMI 19.67 kg/m    General: Sitting up in bed, NAD, unkempt clothing Chest: RRR, no m/r/g appreciated Lungs: on 2L [place], crackles in b/l posterior bases, decreased air movement, normal WOB, able to speak in full sentences.   A/P: 59 year old male presenting with COVID pneumonia. PMH significant for stroke, HTN, sickle cell trait, renal cancer, and prostatic cancer. Currently on remdesivir and dexamethasone. Patient currently stable with a MEWs score of 0. On 2L [place] with sats consistently in the 90s, with normal WOB, crackles in bilateral posterior bases. Continue to monitor respiratory status.  Rise Patience, D.O.  PGY-1  Family Medicine  [phone] 03/24/2020 12:15 AM

## 2020-03-24 NOTE — Evaluation (Signed)
Physical Therapy Evaluation Patient Details Name: Patrick Romero MRN: [id number] DOB: [birth year] Today's Date: 03/24/2020   History of Present Illness  Pt is 58 yo male with PMH significant for CVA, HTN, Sickle Cell trait, depression, kidney and prostate CA, and SCI with back pain.  He presented with [facility] and admitted with COVID 19 PNA.  Clinical Impression  Pt admitted with above diagnosis. Pt presenting with decreased mobility, strength, balance, and endurance.  Pt with chronic deficits on R side due to hx of CVA and SCI.  He was able to transfer to EOB and stand with supervision.  Required min guard and cues for safety with lines for gait.  Pt was on 2 LPM O2 with sats down to 88% with activity and fatigued easily.   Pt currently with functional limitations due to the deficits listed below (see PT Problem List). Pt will benefit from skilled PT to increase their independence and safety with mobility to allow discharge to the venue listed below.       Follow Up Recommendations No PT follow up    Equipment Recommendations  None recommended by PT    Recommendations for Other Services       Precautions / Restrictions Precautions Precautions: None      Mobility  Bed Mobility Overal bed mobility: Needs Assistance Bed Mobility: Supine to Sit     Supine to sit: Supervision        Transfers Overall transfer level: Needs assistance Equipment used: Straight cane Transfers: Sit to/from Stand Sit to Stand: Min guard;Supervision         General transfer comment: min guard progressed to supervison  Ambulation/Gait Ambulation/Gait assistance: Min guard Gait Distance (Feet): 90 Feet Assistive device: Straight cane Gait Pattern/deviations: Decreased stance time - right;Decreased dorsiflexion - right;Decreased stride length Gait velocity: decreased   General Gait Details: min guard for safety and line management; noted tended to keep R knee straight and ambulated with R foot  flat (limited DF and toe off); min cues for posture and deep breathing  Stairs            Wheelchair Mobility    Modified Rankin (Stroke Patients Only)       Balance Overall balance assessment: Needs assistance Sitting-balance support: No upper extremity supported Sitting balance-Leahy Scale: Good     Standing balance support: No upper extremity supported;Single extremity supported Standing balance-Leahy Scale: Fair Standing balance comment: used cane for gait but able to stand for short time without AD                             Pertinent Vitals/Pain Pain Assessment: No/denies pain    Home Living Family/patient expects to be discharged to:: Private residence Living Arrangements: Parent;Other relatives Available Help at Discharge: Family;Available 24 hours/day Type of Home: House Home Access: Stairs to enter Entrance Stairs-Rails: Psychiatric nurse of Steps: 3 Home Layout: One level Home Equipment: Walker - 2 wheels;Cane - single point      Prior Function Level of Independence: Independent with assistive device(s)   Gait / Transfers Assistance Needed: Pt reports he could ambulate in community with [facility]  ADL's / Homemaking Assistance Needed: Independent with ADLS, IADLs.  Also reports could mow yard.  Reports limited driving  Comments: Pt reports he could do most things with increased time.     Hand Dominance        Extremity/Trunk Assessment   Upper Extremity Assessment Upper Extremity  Assessment: LUE deficits/detail;RUE deficits/detail RUE Deficits / Details: Chronic deficits due to CVA and SCI.  Demonstrating 3-4/5 strength throughout and ROM was functional but < compared to L. LUE Deficits / Details: [facility]    Lower Extremity Assessment Lower Extremity Assessment: LLE deficits/detail;RLE deficits/detail RLE Deficits / Details: ROM WFL; MMT 4/5 - noted tended to keep leg straight and decreased DF with gait LLE Deficits /  Details: ROM WFL ; MMT 5/5    Cervical / Trunk Assessment Cervical / Trunk Assessment: Other exceptions Cervical / Trunk Exceptions: forward head  Communication   Communication: No difficulties  Cognition Arousal/Alertness: Awake/alert Behavior During Therapy: WFL for tasks assessed/performed Overall Cognitive Status: Within Functional Limits for tasks assessed                                        General Comments General comments (skin integrity, edema, etc.): Pt was on 2 LPM O2 with sats down to 88% with ambulation but returning to >90% in <15 seconds.  Pt was educated on incentive spirometer and demonstrated.   Also encouraged to lay prone and sit on EOB.  Discussed he is safe if wanted to stand independently at EOB but to call nursing for walking due to lines.  Verbalized understanding.    Exercises     Assessment/Plan    PT Assessment Patient needs continued PT services  PT Problem List Decreased strength;Decreased mobility;Decreased range of motion;Decreased coordination;Decreased activity tolerance;Cardiopulmonary status limiting activity;Decreased balance       PT Treatment Interventions DME instruction;Therapeutic activities;Gait training;Therapeutic exercise;Patient/family education;Balance training;Functional mobility training    PT Goals (Current goals can be found in the Care Plan section)  Acute Rehab PT Goals Patient Stated Goal: return home PT Goal Formulation: With patient Time For Goal Achievement: 04/07/20 Potential to Achieve Goals: Good    Frequency Min 3X/week   Barriers to discharge        Co-evaluation               AM-PAC PT "6 Clicks" Mobility  Outcome Measure Help needed turning from your back to your side while in a flat bed without using bedrails?: None Help needed moving from lying on your back to sitting on the side of a flat bed without using bedrails?: None Help needed moving to and from a bed to a chair  (including a wheelchair)?: None Help needed standing up from a chair using your arms (e.g., wheelchair or bedside chair)?: None Help needed to walk in hospital room?: None Help needed climbing 3-5 steps with a railing? : A Little 6 Click Score: 23    End of Session Equipment Utilized During Treatment: Gait belt Activity Tolerance: Patient tolerated treatment well Patient left: in bed;with call bell/phone within reach (no bed alarm as encouraged pt to sit EOB or stand EOB for increased mobility and improve pulmonary hygeine) Nurse Communication: Mobility status PT Visit Diagnosis: Other abnormalities of gait and mobility (R26.89)    Time: 6962-9528 PT Time Calculation (min) (ACUTE ONLY): 25 min   Charges:   PT Evaluation $PT Eval Low Complexity: 1 Low PT Treatments $Gait Training: 8-22 mins        Abran Richard, PT Acute Rehab Services Pager [phone] [facility] [phone]    Karlton Lemon 03/24/2020, 6:05 PM

## 2020-03-24 NOTE — Progress Notes (Addendum)
Family Medicine Teaching Service Daily Progress Note Intern Pager: [phone]  Patient name: Patrick Romero Medical record number: [id number] Date of birth: 24-Mar-1961 Age: 59 y.o. Gender: male  Primary Care Provider: Ladell Pier, MD Consultants: None Code Status: FULL  Pt Overview and Major Events to Date:  9/13: admitted with COVID-19  Assessment and Plan: Patrick Romero is a 59 y.o. male presenting with SOB, cough and fever 2/2 COVID-19. PMHx is significant for stroke, HTN, Sickle Cell Trait, depression, kidney cancer, prostate cancer, and spinal cord injury/chronic back pain.  Acute Hypoxic Respiratory Failure 2/2 COVID-19 PNA Stable on 2L with O2 sats >95%. Patient denies SOB but endorses ongoing cough. Has been afebrile overnight. Lab work today remarkable for leukopenia (WBC 3.7 from 6.2 yesterday). D-Dimer improved to 11.44 from >20 yesterday. CRP and ferritin pending. Will continue to monitor closely given significant laboratory derangements on admission. CXR on admission consistent with multifocal pneumonia. -f/u inflammatory markers -Decadron 6mg  daily (9/13- ) -Remdesivir daily (9/13- ) -Continuous pulse ox and cardiac monitoring -Wean O2 as tolerated -Incentive spirometry, prone as tolerated  Normocytic Anemia Chronic. Patient's Hgb 9.0 this morning (from 10.7 on admission). Review of previous labs shows patient has a hx of anemia with Hgb ranging anywhere from 9-12. Suspect anemia of chronic disease, but will monitor closely given relatively large change from yesterday. No signs of active bleeding on hx or physical. -Daily CBC  AKI, resolved Creatinine on admission 1.56 (baseline ~1.1), thought to be prerenal secondary to recent diarrhea and poor intake. Repeat Cr this morning improved to 1.12 after IVF hydration. -s/p NS @ 100 cc/hr x10 hours -Daily CMP for COVID, will continue to monitor kidney fxn  Hyponatremia Na 132 on admission, also thought to be secondary  to diarrhea and poor intake. Na 133 this morning. -Daily CMP, will continue to monitor -Encourage PO intake  Elevated Transaminases, Hyperbilirubinemia Improving. AST/ALT 186/156 this morning (from 364/210 yesterday). Total bilirubin elevated to 1.7 (unchanged from admission). Elevations likely due to COVID-19, will continue to monitor. -Daily CMP  HTN Chronic, stable. Most recent BP 132/92 this morning. -Continue home Amlodipine 5mg  daily -Continue to monitor  Spinal Cord Injury/Back Pain Chronic, stable. Home meds: Gabapentin, Baclofen, Tizanidine -Holding Baclofen and Tizanidine -Gabapentin 100mg  TID prn   Hx CVA Chronic, stable. No residual deficits.  -Continue home atorvastatin and aspirin  Tobacco Abuse Current daily smoker (1/3 PPD). Not ready to quit. -Nicotine patch 14mg  daily  FEN/GI: Heart healthy PPx: Lovenox   Status is: Observation The patient remains OBS appropriate and will d/c before 2 midnights.  Dispo: The patient is from: Home              Anticipated d/c is to: Home              Anticipated d/c date is: 2 days              Patient currently is not medically stable to d/c.   Subjective:  No events overnight. Patient feels well overall this morning. Endorses ongoing cough but no further SOB, fever/chills, or diarrhea. Ate dinner well last night and had a normal BM yesterday. No other complaints.   Objective: Temp:  [97.9 F (36.6 C)-102.7 F (39.3 C)] 98.5 F (36.9 C) (09/13 2000) Pulse Rate:  [71-113] 84 (09/14 0400) Resp:  [18-31] 28 (09/14 0400) BP: (101-144)/(74-99) 144/99 (09/14 0400) SpO2:  [91 %-100 %] 99 % (09/14 0400) Weight:  [65.8 kg] 65.8 kg (09/13 1204) Physical Exam:  General: alert, NAD, non-toxic appearing Cardiovascular: RRR, normal S1/S2 without m/r/g Respiratory: normal work of breathing on 2L Patrick Romero, good air movement b/l, lungs CTAB Abdomen: +BS, soft, nontender, nondistended Extremities: 2+ distal pulses, no peripheral  edema  Laboratory: Recent Labs  Lab 03/23/20 1244 03/24/20 0738  WBC 6.2 3.7*  HGB 10.7* 9.0*  HCT 34.0* 29.0*  PLT 279 323   Recent Labs  Lab 03/23/20 1244 03/23/20 2051 03/24/20 0738  NA 132*  --  133*  K 4.2  --  4.5  CL 97*  --  100  CO2 20*  --  20*  BUN 30*  --  28*  CREATININE 1.56*  --  1.12  CALCIUM 8.9  --  8.8*  PROT 7.4  --  6.8  BILITOT 1.7* 1.8* 1.7*  ALKPHOS 104  --  89  ALT 210*  --  158*  AST 364*  --  186*  GLUCOSE 110*  --  144*    Imaging/Diagnostic Tests: CT Angio Chest PE W and/or Wo Contrast Result Date: 03/23/2020 IMPRESSION: 1. No evidence of pulmonary embolism. 2. Moderate patchy bilateral peripheral predominant ground-glass opacities and septal line thickening, consistent with COVID-19 pneumonia.   DG Chest Portable 1 View Result Date: 03/23/2020 IMPRESSION: New ill-defined airspace opacities are noted in right upper lobe and left lingular region concerning for possible multifocal pneumonia.    Alcus Dad, MD 03/24/2020, 9:05 AM PGY-1, [place] Intern pager: [phone], text pages welcome

## 2020-03-25 DIAGNOSIS — U071 COVID-19: Principal | ICD-10-CM

## 2020-03-25 LAB — COMPREHENSIVE METABOLIC PANEL
ALT: 115 U/L — ABNORMAL HIGH (ref 0–44)
AST: 79 U/L — ABNORMAL HIGH (ref 15–41)
Albumin: 2.8 g/dL — ABNORMAL LOW (ref 3.5–5.0)
Alkaline Phosphatase: 74 U/L (ref 38–126)
Anion gap: 10 (ref 5–15)
BUN: 29 mg/dL — ABNORMAL HIGH (ref 6–20)
CO2: 22 mmol/L (ref 22–32)
Calcium: 8.8 mg/dL — ABNORMAL LOW (ref 8.9–10.3)
Chloride: 106 mmol/L (ref 98–111)
Creatinine, Ser: 1.06 mg/dL (ref 0.61–1.24)
GFR calc Af Amer: >60 mL/min (ref >60)
GFR calc non Af Amer: >60 mL/min (ref >60)
Glucose, Bld: 121 mg/dL — ABNORMAL HIGH (ref 70–99)
Potassium: 4.2 mmol/L (ref 3.5–5.1)
Sodium: 138 mmol/L (ref 135–145)
Total Bilirubin: 1.8 mg/dL — ABNORMAL HIGH (ref 0.3–1.2)
Total Protein: 6.5 g/dL (ref 6.5–8.1)

## 2020-03-25 LAB — CBC WITH DIFFERENTIAL/PLATELET
Abs Immature Granulocytes: 0.09 10*3/uL — ABNORMAL HIGH (ref 0.00–0.07)
Basophils Absolute: 0 10*3/uL (ref 0.0–0.1)
Basophils Relative: 0 %
Eosinophils Absolute: 0 10*3/uL (ref 0.0–0.5)
Eosinophils Relative: 0 %
HCT: 27.4 % — ABNORMAL LOW (ref 39.0–52.0)
Hemoglobin: 8.8 g/dL — ABNORMAL LOW (ref 13.0–17.0)
Immature Granulocytes: 1 %
Lymphocytes Relative: 15 %
Lymphs Abs: 1.5 10*3/uL (ref 0.7–4.0)
MCH: 26.3 pg (ref 26.0–34.0)
MCHC: 32.1 g/dL (ref 30.0–36.0)
MCV: 81.8 fL (ref 80.0–100.0)
Monocytes Absolute: 1.5 10*3/uL — ABNORMAL HIGH (ref 0.1–1.0)
Monocytes Relative: 15 %
Neutro Abs: 6.8 10*3/uL (ref 1.7–7.7)
Neutrophils Relative %: 69 %
Platelets: 406 10*3/uL — ABNORMAL HIGH (ref 150–400)
RBC: 3.35 MIL/uL — ABNORMAL LOW (ref 4.22–5.81)
RDW: 12.4 % (ref 11.5–15.5)
WBC: 9.8 10*3/uL (ref 4.0–10.5)
nRBC: 0 % (ref 0.0–0.2)

## 2020-03-25 LAB — D-DIMER, QUANTITATIVE: D-Dimer, Quant: 4.16 ug/mL-FEU — ABNORMAL HIGH (ref 0.00–0.50)

## 2020-03-25 LAB — C-REACTIVE PROTEIN: CRP: 4.9 mg/dL — ABNORMAL HIGH (ref <1.0)

## 2020-03-25 LAB — GLUCOSE, CAPILLARY: Glucose-Capillary: 106 mg/dL — ABNORMAL HIGH (ref 70–99)

## 2020-03-25 LAB — MAGNESIUM: Magnesium: 2.1 mg/dL (ref 1.7–2.4)

## 2020-03-25 MED ORDER — GUAIFENESIN-DM 100-10 MG/5ML PO SYRP
5.0000 mL | ORAL_SOLUTION | ORAL | Status: DC | PRN
  Administered 2020-03-25 (×2): 5 mL via ORAL
  Filled 2020-03-25 (×2): qty 5

## 2020-03-25 MED ORDER — DEXAMETHASONE 6 MG PO TABS: 6.0000 mg | ORAL_TABLET | Freq: Every day | ORAL | 0 refills | Status: AC

## 2020-03-25 MED ORDER — BENZONATATE 100 MG PO CAPS
200.0000 mg | ORAL_CAPSULE | Freq: Two times a day (BID) | ORAL | Status: DC | PRN
  Administered 2020-03-25: 200 mg via ORAL
  Filled 2020-03-25: qty 2

## 2020-03-25 NOTE — Discharge Instructions (Signed)
Patient scheduled for outpatient Remdesivir infusions at 12pm on Thursday 9/16 and Friday 9/17 at [facility]. Please inform the patient to park at [place], as staff will be escorting the patient through the [place] entrance of the hospital. Appointments take approximately 45 minutes.    There is a wave flag banner located near the entrance on [address]. Turn into this entrance and immediately turn left and park in 1 of the 5 designated Covid Infusion Parking spots. There is a phone number on the sign, please call and let the staff know what spot you are in and we will come out and get you. For questions call [phone].  Thanks.   It was a pleasure being part of your care! While in the hospital you were treated for COVID-19 pneumonia. You are being discharged with home care for COVID. Because you required hospitalization for COVID-19 you should quarantine for 21 days from symptom onset, which for you would be 04/09/20. If you have increasing shortness of breath or trouble breathing, you should seek medical care.

## 2020-03-25 NOTE — Care Management (Signed)
Betsy from [place] was able to walk thru the instruction. CM called floor Nurse we were able to troubleshoot. Concentrator functioning with correct setting. No further ED CM needs identified

## 2020-03-25 NOTE — Discharge Summary (Addendum)
[facility] Discharge Summary  Patient name: Patrick Romero Medical record number: [id number] Date of birth: [birth year] Age: 59 y.o. Gender: male Date of Admission: 03/23/2020  Date of Discharge: 03/25/2020 Admitting Physician: Lind Covert, MD  Primary Care Provider: Ladell Pier, MD Consultants: None  Indication for Hospitalization: Acute Hypoxic Respiratory Failure 2/2 COVID  Discharge Diagnoses/Problem List:  COVID-19 Hx of CVA HTN Sickle Cell Trait Kidney Cancer s/p partial nephrectomy Prostate Cancer s/p prostatectomy Hx of Spinal Cord Injury  Disposition: Home  Discharge Condition: Stable, Improved  Discharge Exam:  General: alert, well-appearing, NAD Cardiovascular: RRR, normal S1/S2 without m/r/g Respiratory: normal WOB on room air, lungs CTAB Abdomen: +BS, soft, nontender, nondistended Extremities: no peripheral edema, 2+ distal pulses, mildly decreased strength in R upper and lower extremities as compared to L (chronic)  Brief Hospital Course:   Patrick Romero is a 59 y.o male who presented with SOB, cough and fever, found to be COVID positive. PMHx is significant for HTN, prior CVA, spinal cord injury, sickle cell trait, kidney cancer s/p partial nephrectomy, and prostate cancer s/p prostatectomy.  COVID-19 Pneumonia Patient presented with 4 days of cough and SOB. On admission patient was febrile to 102.7 and tachypneic with O2 sats 88% on room air. He was placed on 2L O2 with improvement in his respiratory status and started on Remdesevir and Decadron. Inflammatory markers were significantly elevated with LDH 1639, Ferritin >7500, D-Dimer >20, CRP 10.9 and fibrinogen 657. His labs were followed daily and had downtrended significantly by the time of discharge.  He remained stable on 2L throughout his admission. At the time of discharge, patient was able to maintain adequate O2 sats on room air at rest but required 2L with  ambulation. He was discharged home with 2L oxygen. He will receive 2 additional days of Remdesevir infusions as an outpatient to complete a 5 day course, and 7 additional days of Decadron to complete a 10 day course.  Elevated Transaminases, Hyperbilirubinemia Patient's AST/ALT were significantly elevated on admission to 364/210 and bilirubin elevated to 1.7. His AST/ALT downtrended to 79/115 at the time of discharge but bilirubin remained elevated at 1.8. Thought to be secondary to COVID infection, but also considered [place]. Patient will need recheck as an outpatient and may require further workup if persistently elevated.  Normocytic Anemia Patient's hemoglobin 10.7 on admission. CBC was checked daily and his hemoglobin was 8.8 on day of discharge. Patient thought to have chronic anemia based on review of prior labs (Hgb ranging anywhere from 9-12), although etiology unclear. No additional workup was performed inpatient but patient may need monitoring and/or treatment of his anemia as an outpatient.  Issues for Follow Up:  1. Elevated Liver Function Tests: recommend monitoring in outpatient setting for resolution. If no improvement, recommend further outpatient work up. 2. Anemia: continue to monitor as an outpatient  Significant Procedures: None  Significant Labs and Imaging:  Recent Labs  Lab 03/23/20 1244 03/24/20 0738 03/25/20 0500  WBC 6.2 3.7* 9.8  HGB 10.7* 9.0* 8.8*  HCT 34.0* 29.0* 27.4*  PLT 279 323 406*   Recent Labs  Lab 03/23/20 1244 03/23/20 1244 03/24/20 0738 03/25/20 0500  NA 132*  --  133* 138  K 4.2   < > 4.5 4.2  CL 97*  --  100 106  CO2 20*  --  20* 22  GLUCOSE 110*  --  144* 121*  BUN 30*  --  28* 29*  CREATININE 1.56*  --  1.12 1.06  CALCIUM 8.9  --  8.8* 8.8*  MG  --   --  2.1 2.1  ALKPHOS 104  --  89 74  AST 364*  --  186* 79*  ALT 210*  --  158* 115*  ALBUMIN 3.3*  --  2.9* 2.8*   < > = values in this interval not displayed.     Results/Tests Pending at Time of Discharge: None  Discharge Medications:  Allergies as of 03/25/2020   No Known Allergies     Medication List    STOP taking these medications   diclofenac 50 MG EC tablet Commonly known as: VOLTAREN     TAKE these medications   amitriptyline 25 MG tablet Commonly known as: ELAVIL Take 1 tablet (25 mg total) by mouth at bedtime.   amLODipine 5 MG tablet Commonly known as: NORVASC TAKE 1 TABLET BY MOUTH EVERY DAY What changed:   how much to take  how to take this  when to take this  additional instructions   aspirin EC 81 MG tablet Take 1 tablet (81 mg total) by mouth daily.   atorvastatin 10 MG tablet Commonly known as: LIPITOR Take 1 tablet (10 mg total) by mouth daily.   baclofen 20 MG tablet Commonly known as: LIORESAL TAKE 1 TABLET BY MOUTH 4 TIMES A DAY   dexamethasone 6 MG tablet Commonly known as: DECADRON Take 1 tablet (6 mg total) by mouth daily for 7 days.   gabapentin 300 MG capsule Commonly known as: NEURONTIN TAKE 3 CAPSULES BY MOUTH 3 TIMES A DAY What changed: See the new instructions.   Narcan 4 MG/0.1ML Liqd nasal spray kit Generic drug: naloxone Place 1 spray into the nose once. As needed   tiZANidine 2 MG tablet Commonly known as: ZANAFLEX Take 1-2 tablets (2-4 mg total) by mouth 4 (four) times daily. (Take 2 at bedtime)       Discharge Instructions: Please refer to Patient Instructions section of EMR for full details.  Patient was counseled important signs and symptoms that should prompt return to medical care, changes in medications, dietary instructions, activity restrictions, and follow up appointments.   Follow-Up Appointments: Thursday 9/16 and Friday 9/17 at 12pm for Remdesevir infusions  Alcus Dad, MD 03/26/2020, 12:25 PM PGY-1, [place]

## 2020-03-25 NOTE — Hospital Course (Signed)
° °  Follow Up Elevated Liver Function Tests: recommend monitoring in outpatient setting for resolution. If no improvement, recommend further outpatient work up. Anemia: continue to monitor as an outpatient

## 2020-03-25 NOTE — Progress Notes (Signed)
Physical Therapy Treatment Patient Details Name: Patrick Romero MRN: [id number] DOB: [birth year] Today's Date: 03/25/2020    History of Present Illness Pt is 59 yo male with PMH significant for CVA, HTN, Sickle Cell trait, depression, kidney and prostate CA, and SCI with back pain.  He presented with [facility] and admitted with COVID 19 PNA.    PT Comments    Pt reporting increased fatigue today, but agreeable to room mobility this day. Pt ambulatory on RA, with brief dip in SpO2 to 86% with mild dyspnea noted. Pt placed on 2LO2 to recover sats. Per OT report, pt maintained sats 90% and greater on RA during room mobility. MD informed of this information. PT administered LE HEP to improve pt functional strength upon d/c. PT recommending HHPT vs continuing with OPPT, pt with very inconsistent information about help at home, transportation for covid infusions, and overall status vs baseline. PT to continue to follow acutely.  SATURATION QUALIFICATIONS: (This note is used to comply with regulatory documentation for home oxygen)  Patient Saturations on Room Air at Rest = 93%  Patient Saturations on Room Air while Ambulating = 86%  Patient Saturations on 2 Liters of oxygen while Ambulating = 92%  Please briefly explain why patient needs home oxygen: to maintain SpO2    Follow Up Recommendations  Home health PT;Other (comment) (vs continue with OPPT)     Equipment Recommendations  None recommended by PT    Recommendations for Other Services       Precautions / Restrictions Precautions Precautions: None Restrictions Weight Bearing Restrictions: No    Mobility  Bed Mobility Overal bed mobility: Modified Independent Bed Mobility: Supine to Sit;Sit to Supine     Supine to sit: Modified independent (Device/Increase time) Sit to supine: Modified independent (Device/Increase time)   General bed mobility comments: increased time and effort  Transfers Overall transfer level: Needs  assistance Equipment used: Straight cane;Rolling walker (2 wheeled) Transfers: Sit to/from Stand Sit to Stand: Min guard Stand pivot transfers: Supervision       General transfer comment: for safety, verbal cuing for hand placement when rising. First stand with straight cane, pt unsteady so next stand PT opted for RW.  Ambulation/Gait Ambulation/Gait assistance: Supervision Gait Distance (Feet): 45 Feet Assistive device: Rolling walker (2 wheeled) Gait Pattern/deviations: Decreased stance time - right;Trunk flexed;Step-through pattern;Decreased stride length Gait velocity: decr   General Gait Details: min guard for safety, slow and steady gait with use of RW. SpO2 minimum 86% on RA, placed on 2LO2 to recover sats. DOE 2/4 with activiity.   Stairs             Wheelchair Mobility    Modified Rankin (Stroke Patients Only)       Balance Overall balance assessment: Needs assistance   Sitting balance-Leahy Scale: Good     Standing balance support: No upper extremity supported;Single extremity supported Standing balance-Leahy Scale: Fair Standing balance comment: uses cane or RW for gait                            Cognition Arousal/Alertness: Awake/alert Behavior During Therapy: WFL for tasks assessed/performed Overall Cognitive Status: Within Functional Limits for tasks assessed                                 General Comments: inconsistent history giving PT vs OT; informs PT that he won't have  help at home because he doesn't want to get his family sick, but tells OT that his son will help him. Per MD report, pt plans on taking a taxi to covid infusions because he has no transportation, but cannot due to covid quarantining.      Exercises General Exercises - Lower Extremity Long Arc Quad: AROM;Both;10 reps;Seated Hip Flexion/Marching: AROM;Both;10 reps;Seated Toe Raises: AROM;Both;10 reps;Seated Other Exercises Other Exercises: HEP  administered: sit to stands x5, LAQs x10, toe raises in seated x10, seated marches x10. To be completed 3x/day. Other Exercises: pursed lip breathing    General Comments        Pertinent Vitals/Pain Pain Assessment: No/denies pain    Home Living Family/patient expects to be discharged to:: Private residence Living Arrangements: Parent;Other relatives Available Help at Discharge: Family;Available 24 hours/day Type of Home: House Home Access: Stairs to enter Entrance Stairs-Rails: Right;Left Home Layout: One level Home Equipment: Environmental consultant - 2 wheels;Cane - single point;Tub bench      Prior Function Level of Independence: Independent with assistive device(s)  Gait / Transfers Assistance Needed: Pt reports he could ambulate in community with [facility] ADL's / Homemaking Assistance Needed: Independent with ADLS, IADLs.  Also reports could mow yard.  Reports limited driving Comments: Son assisted wtih socks/buttoning due to R hand paresthesia/paresis form SCI   PT Goals (current goals can now be found in the care plan section) Acute Rehab PT Goals Patient Stated Goal: return home PT Goal Formulation: With patient Time For Goal Achievement: 04/07/20 Potential to Achieve Goals: Good Progress towards PT goals: Progressing toward goals    Frequency    Min 3X/week      PT Plan Discharge plan needs to be updated    Co-evaluation              AM-PAC PT "6 Clicks" Mobility   Outcome Measure  Help needed turning from your back to your side while in a flat bed without using bedrails?: None Help needed moving from lying on your back to sitting on the side of a flat bed without using bedrails?: None Help needed moving to and from a bed to a chair (including a wheelchair)?: None Help needed standing up from a chair using your arms (e.g., wheelchair or bedside chair)?: None Help needed to walk in hospital room?: None Help needed climbing 3-5 steps with a railing? : A Little 6 Click  Score: 23    End of Session Equipment Utilized During Treatment: Gait belt Activity Tolerance: Patient limited by fatigue Patient left: in bed;with call bell/phone within reach (no bed alarm as encouraged pt to sit EOB or stand EOB for increased mobility and improve pulmonary hygeine) Nurse Communication: Mobility status PT Visit Diagnosis: Other abnormalities of gait and mobility (R26.89)     Time: 6546-5035 PT Time Calculation (min) (ACUTE ONLY): 27 min  Charges:  $Gait Training: 8-22 mins $Therapeutic Exercise: 8-22 mins                     Dequan Kindred E, PT Acute Rehabilitation Services Pager [phone]  Office [phone]   Alyanah Elliott D Elonda Husky 03/25/2020, 4:40 PM

## 2020-03-25 NOTE — TOC Progression Note (Addendum)
Transition of Care [facility]) - Progression Note    Patient Details  Name: Patrick Romero MRN: [id number] Date of Birth: 1960-12-25  Transition of Care [facility]) CM/SW [place], RN Phone Number: [phone] 03/25/2020, 4:34 PM  Clinical Narrative:    MD called requesting home O2 setup. Notified Betsy with [employer].  The oxygen saturation screen needs to be documented and the order needs the O2 liter flow along with all additional info. CM can not enter orders without O2 saturation screening. Bedside nurse Chester Holstein has been made aware and [place] is on standby to set up O2. Taxi voucher has also been left at the nurses station for transport home.   1700 CM called Betsy with [employer] [facility] to make her aware that Oxygen saturation screen is in progress and that bedside nurse is aware and will follow up. No answer with [place] has been left.       Expected Discharge Plan and Services                                                 Social Determinants of Health (SDOH) Interventions    Readmission Risk Interventions No flowsheet data found.

## 2020-03-25 NOTE — Progress Notes (Signed)
Patient scheduled for outpatient Remdesivir infusions at 12pm on Thursday 9/16 and Friday 9/17 at [facility]. Please inform the patient to park at [place], as staff will be escorting the patient through the [place] entrance of the hospital.    There is a wave flag banner located near the entrance on [address]. Turn into this entrance and immediately turn left and park in 1 of the 5 designated Covid Infusion Parking spots. There is a phone number on the sign, please call and let the staff know what spot you are in and we will come out and get you. For questions call [phone].  Thanks.

## 2020-03-25 NOTE — Progress Notes (Addendum)
Family Medicine Teaching Service Daily Progress Note Intern Pager: [phone]  Patient name: Patrick Romero Medical record number: [id number] Date of birth: 10-01-1960 Age: 59 y.o. Gender: male  Primary Care Provider: Ladell Pier, MD Consultants: None Code Status: FULL  Pt Overview and Major Events to Date:   Patrick Clayson Vinsonis a 59 y.o.malepresenting with SOB, cough and fever 2/2 COVID-19. PMHxis significant for stroke, HTN, sickle cell trait, depression, kidney cancer, prostate cancer,and spinal cord injury/chronic back pain.  Assessment and Plan:  Acute Hypoxic Respiratory Failure 2/2 COVID-19 PNA Stable on 1-2L with O2 sats >88% at rest and with ambulation. Patient often removes his nasal cannula and O2 sats are in the mid to high 80s on room air with exertion. Ongoing cough but no SOB. Leukopenia resolved, WBC 9.8 today (3.7 yesterday). CRP downtrending, 4.9 from 10.3 yesterday. D-Dimer pending. -Decadron 6mg  daily (9/13- ) -Remdesevir daily (9/13- ) -Continuous pulse ox and cardiac monitoring -Wean O2 as tolerated -Incentive spirometry, prone as tolerated -Robitussin and Tessalon prn for cough  Elevated Transaminases, Hyperbilirubinemia Improving. AST/ALT decreased to 79/115 today. Total bili remains unchanged at 1.8. given patient's anemia and elevated bili, consider Gilbert's. -Daily CMP  Normocytic Anemia Chronic. Hgb stable at 8.8 this morning. -Daily CBC -May need further workup as outpatient  Hyponatremia, Resolved Na 138 this morning (from 133 yesterday). -Daily CMP  HTN Chronic, stable. Patient has been normotensive with BP 103/68 this morning. -Continue home amlodipine 5mg  daily -Continue to monitor  Hx CVA PT evaluated patient yesterday, no PT follow up recommended.  Tobacco Use -Nicotine patch 14mg  daily   FEN/GI: Heart healthy PPx: Lovenox  Status is: Inpatient  Remains inpatient appropriate because:IV treatments appropriate due to  intensity of illness or inability to take PO  Dispo:  Patient From: Home  Planned Disposition: Home  Expected discharge date: 03/25/20   Subjective:  Patient states he feels well. He has ongoing cough but denies SOB or other complaints. Is eating and drinking well, ambulating to/from the bathroom, and states he feels well enough to go home today.  Objective: Temp:  [98.1 F (36.7 C)-98.9 F (37.2 C)] 98.7 F (37.1 C) (09/15 0812) Pulse Rate:  [69-90] 82 (09/15 0812) Resp:  [10-27] 19 (09/15 0812) BP: (103-161)/(68-146) 103/68 (09/15 0812) SpO2:  [83 %-100 %] 96 % (09/15 2353) Physical Exam: General: alert, well-appearing, NAD Cardiovascular: RRR, normal S1/S2 without m/r/g Respiratory: normal WOB on room air, lungs CTAB Abdomen: +BS, soft, nontender, nondistended Extremities: no peripheral edema, 2+ distal pulses, mildly decreased strength in R upper and lower extremities as compared to L (chronic)  Laboratory: Recent Labs  Lab 03/23/20 1244 03/24/20 0738 03/25/20 0500  WBC 6.2 3.7* 9.8  HGB 10.7* 9.0* 8.8*  HCT 34.0* 29.0* 27.4*  PLT 279 323 406*   Recent Labs  Lab 03/23/20 1244 03/23/20 1244 03/23/20 2051 03/24/20 0738 03/25/20 0500  NA 132*  --   --  133* 138  K 4.2  --   --  4.5 4.2  CL 97*  --   --  100 106  CO2 20*  --   --  20* 22  BUN 30*  --   --  28* 29*  CREATININE 1.56*  --   --  1.12 1.06  CALCIUM 8.9  --   --  8.8* 8.8*  PROT 7.4  --   --  6.8 6.5  BILITOT 1.7*   < > 1.8* 1.7* 1.8*  ALKPHOS 104  --   --  89 74  ALT 210*  --   --  158* 115*  AST 364*  --   --  186* 79*  GLUCOSE 110*  --   --  144* 121*   < > = values in this interval not displayed.    D-dimer, CRP pending  Imaging/Diagnostic Tests: No new imaging.  Alcus Dad, MD 03/25/2020, 9:29 AM PGY-1, [place] Intern pager: [phone], text pages welcome

## 2020-03-25 NOTE — Plan of Care (Signed)
  Problem: Education: Goal: Knowledge of risk factors and measures for prevention of condition will improve 03/25/2020 0046 by Donzetta Sprung, RN Outcome: Progressing 03/25/2020 0046 by Donzetta Sprung, RN Outcome: Progressing   Problem: Coping: Goal: Psychosocial and spiritual needs will be supported 03/25/2020 0046 by Donzetta Sprung, RN Outcome: Progressing 03/25/2020 0046 by Donzetta Sprung, RN Outcome: Progressing   Problem: Respiratory: Goal: Will maintain a patent airway 03/25/2020 0046 by Donzetta Sprung, RN Outcome: Progressing 03/25/2020 0046 by Donzetta Sprung, RN Outcome: Progressing Goal: Complications related to the disease process, condition or treatment will be avoided or minimized 03/25/2020 0046 by Donzetta Sprung, RN Outcome: Progressing 03/25/2020 0046 by Donzetta Sprung, RN Outcome: Progressing   Problem: Education: Goal: Knowledge of General Education information will improve Description: Including pain rating scale, medication(s)/side effects and non-pharmacologic comfort measures 03/25/2020 0046 by Donzetta Sprung, RN Outcome: Progressing 03/25/2020 0046 by Donzetta Sprung, RN Outcome: Progressing   Problem: Health Behavior/Discharge Planning: Goal: Ability to manage health-related needs will improve 03/25/2020 0046 by Donzetta Sprung, RN Outcome: Progressing 03/25/2020 0046 by Donzetta Sprung, RN Outcome: Progressing   Problem: Clinical Measurements: Goal: Ability to maintain clinical measurements within normal limits will improve 03/25/2020 0046 by Donzetta Sprung, RN Outcome: Progressing 03/25/2020 0046 by Donzetta Sprung, RN Outcome: Progressing Goal: Will remain free from infection 03/25/2020 0046 by Donzetta Sprung, RN Outcome: Progressing 03/25/2020 0046 by Donzetta Sprung, RN Outcome: Progressing Goal: Diagnostic test results will improve 03/25/2020 0046 by Donzetta Sprung, RN Outcome: Progressing 03/25/2020 0046 by Donzetta Sprung, RN Outcome:  Progressing Goal: Respiratory complications will improve 03/25/2020 0046 by Donzetta Sprung, RN Outcome: Progressing 03/25/2020 0046 by Donzetta Sprung, RN Outcome: Progressing Goal: Cardiovascular complication will be avoided 03/25/2020 0046 by Donzetta Sprung, RN Outcome: Progressing 03/25/2020 0046 by Donzetta Sprung, RN Outcome: Progressing   Problem: Activity: Goal: Risk for activity intolerance will decrease 03/25/2020 0046 by Donzetta Sprung, RN Outcome: Progressing 03/25/2020 0046 by Donzetta Sprung, RN Outcome: Progressing   Problem: Nutrition: Goal: Adequate nutrition will be maintained 03/25/2020 0046 by Donzetta Sprung, RN Outcome: Progressing 03/25/2020 0046 by Donzetta Sprung, RN Outcome: Progressing   Problem: Coping: Goal: Level of anxiety will decrease 03/25/2020 0046 by Donzetta Sprung, RN Outcome: Progressing 03/25/2020 0046 by Donzetta Sprung, RN Outcome: Progressing   Problem: Elimination: Goal: Will not experience complications related to bowel motility 03/25/2020 0046 by Donzetta Sprung, RN Outcome: Progressing 03/25/2020 0046 by Donzetta Sprung, RN Outcome: Progressing Goal: Will not experience complications related to urinary retention 03/25/2020 0046 by Donzetta Sprung, RN Outcome: Progressing 03/25/2020 0046 by Donzetta Sprung, RN Outcome: Progressing   Problem: Pain Managment: Goal: General experience of comfort will improve 03/25/2020 0046 by Donzetta Sprung, RN Outcome: Progressing 03/25/2020 0046 by Donzetta Sprung, RN Outcome: Progressing   Problem: Safety: Goal: Ability to remain free from injury will improve 03/25/2020 0046 by Donzetta Sprung, RN Outcome: Progressing 03/25/2020 0046 by Donzetta Sprung, RN Outcome: Progressing   Problem: Skin Integrity: Goal: Risk for impaired skin integrity will decrease 03/25/2020 0046 by Donzetta Sprung, RN Outcome: Progressing 03/25/2020 0046 by Donzetta Sprung, RN Outcome: Progressing

## 2020-03-25 NOTE — Progress Notes (Signed)
Occupational Therapy Evaluation Patient Details Name: Patrick Romero MRN: [id number] DOB: [birth year] Today's Date: 03/25/2020    History of Present Illness Pt is 59 yo male with PMH significant for CVA, HTN, Sickle Cell trait, depression, kidney and prostate CA, and SCI with back pain.  He presented with [facility] and admitted with COVID 19 PNA.   Clinical Impression   PTA, pt lived at home with his Mom and son and had assistance as needed for ADL tasks. Pt demonstrates a mild decline in functional status and feel he would benefit from follow up OT after DC. Would recommend HHOT, however if unable to receive [facility] services, recommend follow up with OT at the neuro outpt center where pt was going for OT/PT prior to Covid due to previous SCI. Educated pt on theraband HEP in addition to strategies for energy conservation and  to reduce risk of falls. At rest SpO2 95 RA During mobility and ADL pt briefly desat to 89 with quick rebound to 91. 1/4 DOE.     Follow Up Recommendations  Home health OT;Supervision - Intermittent   Equipment Recommendations  None recommended by OT    Recommendations for Other Services       Precautions / Restrictions Precautions Precautions: None      Mobility Bed Mobility Overal bed mobility: Modified Independent                Transfers Overall transfer level: Needs assistance   Transfers: Sit to/from Stand;Stand Pivot Transfers Sit to Stand: Supervision Stand pivot transfers: Supervision            Balance Overall balance assessment: Needs assistance   Sitting balance-Leahy Scale: Good       Standing balance-Leahy Scale: Fair Standing balance comment: uses cane or RW for gait                           ADL either performed or assessed with clinical judgement   ADL Overall ADL's : Needs assistance/impaired                                     Functional mobility during ADLs: Supervision/safety;Rolling  walker General ADL Comments: min A to donn socks which is baleine; set up/S for other ADL tasks     Vision         Perception     Praxis      Pertinent Vitals/Pain Pain Assessment: No/denies pain     Hand Dominance Right (now uses L)   Extremity/Trunk Assessment Upper Extremity Assessment Upper Extremity Assessment: RUE deficits/detail RUE Deficits / Details: Chronic deficits due to CVA and SCI. Poor in-hand manipulation skills duet o prior injury; uses as functional assist   Lower Extremity Assessment Lower Extremity Assessment: Defer to PT evaluation   Cervical / Trunk Assessment Cervical / Trunk Assessment: Other exceptions Cervical / Trunk Exceptions: forward head (previous neck fusion)   Communication Communication Communication: No difficulties   Cognition Arousal/Alertness: Awake/alert Behavior During Therapy: WFL for tasks assessed/performed Overall Cognitive Status: Within Functional Limits for tasks assessed                                 General Comments: most likely at baseline   General Comments       Exercises Exercises: Other exercises Other Exercises Other  Exercises: level 1 Covid theraband HEP  - educated pt . Pt verbalized understanidng. Written handouts provided Other Exercises: pursed lip breathing   Shoulder Instructions      Home Living Family/patient expects to be discharged to:: Private residence Living Arrangements: Parent;Other relatives Available Help at Discharge: Family;Available 24 hours/day Type of Home: House Home Access: Stairs to enter [employer] of Steps: 3 Entrance Stairs-Rails: Right;Left Home Layout: One level     Bathroom Shower/Tub: Tub/shower unit;Walk-in shower   Bathroom Toilet: Standard     Home Equipment: Environmental consultant - 2 wheels;Cane - single point;Tub bench          Prior Functioning/Environment Level of Independence: Independent with assistive device(s)  Gait / Transfers  Assistance Needed: Pt reports he could ambulate in community with [facility] ADL's / Homemaking Assistance Needed: Independent with ADLS, IADLs.  Also reports could mow yard.  Reports limited driving   Comments: Son assisted wtih socks/buttoning due to R hand paresthesia/paresis form SCI        OT Problem List: Decreased strength;Decreased activity tolerance;Impaired balance (sitting and/or standing);Decreased knowledge of use of DME or AE;Cardiopulmonary status limiting activity;Impaired tone;Impaired UE functional use      OT Treatment/Interventions:      OT Goals(Current goals can be found in the care plan section) Acute Rehab OT Goals Patient Stated Goal: return home OT Goal Formulation: All assessment and education complete, DC therapy  OT Frequency:     Barriers to D/C:            Co-evaluation              AM-PAC OT "6 Clicks" Daily Activity     Outcome Measure Help from another person eating meals?: None Help from another person taking care of personal grooming?: A Little Help from another person toileting, which includes using toliet, bedpan, or urinal?: A Little Help from another person bathing (including washing, rinsing, drying)?: A Little Help from another person to put on and taking off regular upper body clothing?: A Little Help from another person to put on and taking off regular lower body clothing?: A Little 6 Click Score: 19   End of Session Equipment Utilized During Treatment: Oxygen (1L) Nurse Communication: Mobility status  Activity Tolerance: Patient tolerated treatment well Patient left: in bed;with call bell/phone within reach  OT Visit Diagnosis: Unsteadiness on feet (R26.81);Muscle weakness (generalized) (M62.81)                Time: 1545-1610 OT Time Calculation (min): 25 min Charges:  OT General Charges $OT Visit: 1 Visit OT Evaluation $OT Eval Low Complexity: 1 Low OT Treatments $Self Care/Home Management : 8-22 mins  Maurie Boettcher, OT/L    Acute OT Clinical Specialist Acute Rehabilitation Services Pager [phone] Office [phone]   [facility] 03/25/2020, 4:23 PM

## 2020-03-25 NOTE — Care Management (Signed)
ED CM received call from nurse on 2W concerning patient not receiving discharge prescriptions from the [place], but he does not need any meds tonight CM instructed him to contact them in the [facility] in the am. CM will also notify CM to follow up.  Nurse  also states, that patient received a portable oxygen concentrator from [facility] today for discharge and does not know the function.  CM will contact the supervisor concerning the DME awaiting a return call

## 2020-03-26 ENCOUNTER — Ambulatory Visit (HOSPITAL_COMMUNITY)
Admit: 2020-03-26 | Discharge: 2020-03-26 | Disposition: A | Discharge status: Home / Self Care | Payer: Medicare Other | Attending: Pulmonary Disease | Admitting: Pulmonary Disease

## 2020-03-26 ENCOUNTER — Telehealth: Payer: Self-pay

## 2020-03-26 NOTE — Telephone Encounter (Signed)
Transition Care Management Follow-up Telephone Call Date of discharge and from where:03/25/2020, [facility]  Call placed to patient # [phone], message left with call back requested to this CM # [phone].  Patient needs to schedule follow up appointment with PCP

## 2020-03-27 ENCOUNTER — Ambulatory Visit (HOSPITAL_COMMUNITY): Admit: 2020-03-27 | Payer: Medicare Other

## 2020-03-27 ENCOUNTER — Telehealth: Payer: Self-pay

## 2020-03-27 NOTE — Telephone Encounter (Signed)
Transition Care Management Follow-up Telephone Call  Date of discharge and from where: 03/25/2020; [facility]   How have you been since you were released from the hospital? He said he is tired but doing fairly well.   Any questions or concerns?he said that he missed his infusion appointment yesterday because he did not have transportation and he explained this to the infusion center. He stated that they told him that they will try to arrange transportation for him. He plans to contact the infusion center this morning regarding his appointment this afternoon and check if they have been able to arrange a ride for him.   Items Reviewed:  Did the pt receive and understand the discharge instructions provided?  Yes and he said that he understands that he needs to remain quarantined until 04/09/2020.   Medications obtained and verified?  he said he has all medications including the decadron and he did not have any questions about his med regime  Any new allergies since your discharge?  none reported   Do you have support at home?  yes, he stated that he has help at home.  No home health ordered   He has O2 from [place] that he said he is using @ 2L continuously.   Functional Questionnaire: (I = Independent and D = Dependent) ADLs: indpenendent.  Has walker and cane to use as needed  Follow up appointments reviewed:   PCP Hospital f/u appt confirmed?.his appointment with Dr Wynetta Emery is not until 05/12/2020; but he did not want to schedule another appointment at this time. He said he wanted to wait until the end of the month and he will call if he needs to be seen.   [facility] f/u appt confirmed?he needs to call the infusion center about his appointment today.   Are transportation arrangements needed?  he usually uses medicaid transportation  If their condition worsens, is the pt aware to call PCP or go to the Emergency Dept.? yes  Was the patient provided with contact  information for the PCP's office or ED?  he has the clinic phone number  Was to pt encouraged to call back with questions or concerns? yes

## 2020-03-28 LAB — CULTURE, BLOOD (ROUTINE X 2)
Culture: NO GROWTH
Culture: NO GROWTH
Special Requests: ADEQUATE

## 2020-03-30 ENCOUNTER — Other Ambulatory Visit: Payer: Self-pay | Admitting: Internal Medicine

## 2020-03-30 DIAGNOSIS — E7800 Pure hypercholesterolemia, unspecified: Secondary | ICD-10-CM

## 2020-04-22 NOTE — Progress Notes (Signed)
Patient ID: Patrick Romero, male   DOB: 1960-07-20, 59 y.o.   MRN: [id number]   Virtual Visit via Telephone Note  I connected with [name] on 04/23/20 at  9:10 AM EDT by telephone and verified that I am speaking with the correct person using two identifiers.   I discussed the limitations, risks, security and privacy concerns of performing an evaluation and management service by telephone and the availability of in person appointments. I also discussed with the patient that there may be a patient responsible charge related to this service. The patient expressed understanding and agreed to proceed.   PATIENT visit by telephone virtually in the context of Covid-19 pandemic. Patient location:  home My Location:  [facility] office Persons on the call:  Me and the patient   History of Present Illness: After hospitalization for Covid 19 9/13-9/15/2021.  He is still using oxygen some.  He has transportation issues and was unable to come in person today or to got and get the antibody infusions.  He is coughing up some greenish mucus.  He is wheezing some and does not have an inhaler.  No fever.  Appetite is good.  Taste and smell improving.  No distress.    He needs f/up blood work but wants to wait til November 2nd appt with PCP bc he has arranged transportation  Needs ophthalmology referral due to cataracts  Needs pain management refrral for chronic pain  From discharge summary: Discharge Diagnoses/Problem List:  COVID-19 Hx of CVA HTN Sickle Cell Trait Kidney Cancer s/p partial nephrectomy Prostate Cancer s/p prostatectomy Hx of Spinal Cord Injury  Disposition: Home  Discharge Condition: Stable, Improved  Discharge Exam:  General:alert, well-appearing, NAD Cardiovascular:RRR, normal S1/S2 without m/r/g Respiratory:normal WOB on room air, lungs CTAB Abdomen:+BS, soft, nontender, nondistended Extremities:no peripheral edema, 2+ distal pulses, mildly decreased strength in R  upper and lower extremities as compared to L (chronic)  Brief Hospital Course:   AMANTE FOMBY is a 59 y.o male who presented with SOB, cough and fever, found to be COVID positive. PMHx is significant for HTN, prior CVA, spinal cord injury, sickle cell trait, kidney cancer s/p partial nephrectomy, and prostate cancer s/p prostatectomy.  COVID-19 Pneumonia Patient presented with 4 days of cough and SOB. On admission patient was febrile to 102.7 and tachypneic with O2 sats 88% on room air. He was placed on 2L O2 with improvement in his respiratory status and started on Remdesevir and Decadron. Inflammatory markers were significantly elevated with LDH 1639, Ferritin >7500, D-Dimer >20, CRP 10.9 and fibrinogen 657. His labs were followed daily and had downtrended significantly by the time of discharge.  He remained stable on 2L throughout his admission. At the time of discharge, patient was able to maintain adequate O2 sats on room air at rest but required 2L with ambulation. He was discharged home with 2L oxygen. He will receive 2 additional days of Remdesevir infusions as an outpatient to complete a 5 day course, and 7 additional days of Decadron to complete a 10 day course.  Elevated Transaminases, Hyperbilirubinemia Patient's AST/ALT were significantly elevated on admission to 364/210 and bilirubin elevated to 1.7. His AST/ALT downtrended to 79/115 at the time of discharge but bilirubin remained elevated at 1.8. Thought to be secondary to COVID infection, but also considered [place]ts. Patient will need recheck as an outpatient and may require further workup if persistently elevated.  Normocytic Anemia Patient's hemoglobin 10.7 on admission. CBC was checked daily and his hemoglobin  was 8.8 on day of discharge. Patient thought to have chronic anemia based on review of prior labs (Hgb ranging anywhere from 9-12), although etiology unclear. No additional workup was performed inpatient but patient  may need monitoring and/or treatment of his anemia as an outpatient.  Issues for Follow Up:  1. Elevated Liver Function Tests: recommend monitoring in outpatient setting for resolution. If no improvement, recommend further outpatient work up. 2. Anemia: continue to monitor as an outpatient     Observations/Objective:  NAD.  A&Ox3   Assessment and Plan: 1. Anemia, unspecified type Check CBC in 3 weeks with PCP  2. COVID-19 Much improved-will cover for atypicals/secondary infection - azithromycin (ZITHROMAX) 250 MG tablet; Take 2 today then 1 daily  Dispense: 6 tablet; Refill: 0 - albuterol (VENTOLIN HFA) 108 (90 Base) MCG/ACT inhaler; Inhale 2 puffs into the lungs every 6 (six) hours as needed for wheezing or shortness of breath.  Dispense: 8 g; Refill: 2  3. Chronic neck and back pain - Ambulatory referral to Pain Clinic  4. Central cord syndrome, sequela ([place]) - Ambulatory referral to Pain Clinic  5. Elevated LFTs Labs at next visit-he is arranging transportation  6. Other age-related cataract of both eyes - Ambulatory referral to Ophthalmology   Follow Up Instructions: His next visit should be in person if possible bc he needs labs and has arranged transportation and wants to see PCP in person   I discussed the assessment and treatment plan with the patient. The patient was provided an opportunity to ask questions and all were answered. The patient agreed with the plan and demonstrated an understanding of the instructions.   The patient was advised to call back or seek an in-person evaluation if the symptoms worsen or if the condition fails to improve as anticipated.  I provided 18 minutes of non-face-to-face time during this encounter.   Freeman Caldron, PA-C

## 2020-04-23 ENCOUNTER — Other Ambulatory Visit: Payer: Self-pay

## 2020-04-23 ENCOUNTER — Ambulatory Visit: Payer: Medicare Other | Attending: Physician Assistant | Admitting: Physician Assistant

## 2020-04-23 ENCOUNTER — Encounter: Payer: Self-pay | Admitting: Physician Assistant

## 2020-04-23 ENCOUNTER — Other Ambulatory Visit: Payer: Self-pay | Admitting: Physical Medicine & Rehabilitation

## 2020-04-23 DIAGNOSIS — R7989 Other specified abnormal findings of blood chemistry: Secondary | ICD-10-CM

## 2020-04-23 DIAGNOSIS — M542 Cervicalgia: Secondary | ICD-10-CM | POA: Diagnosis not present

## 2020-04-23 DIAGNOSIS — M79609 Pain in unspecified limb: Secondary | ICD-10-CM

## 2020-04-23 DIAGNOSIS — H2589 Other age-related cataract: Secondary | ICD-10-CM

## 2020-04-23 DIAGNOSIS — D649 Anemia, unspecified: Secondary | ICD-10-CM

## 2020-04-23 DIAGNOSIS — S14129S Central cord syndrome at unspecified level of cervical spinal cord, sequela: Secondary | ICD-10-CM

## 2020-04-23 DIAGNOSIS — R202 Paresthesia of skin: Secondary | ICD-10-CM

## 2020-04-23 DIAGNOSIS — U071 COVID-19: Secondary | ICD-10-CM

## 2020-04-23 DIAGNOSIS — M549 Dorsalgia, unspecified: Secondary | ICD-10-CM

## 2020-04-23 DIAGNOSIS — G8929 Other chronic pain: Secondary | ICD-10-CM

## 2020-04-23 MED ORDER — ALBUTEROL SULFATE HFA 108 (90 BASE) MCG/ACT IN AERS: 2.0000 | INHALATION_SPRAY | Freq: Four times a day (QID) | RESPIRATORY_TRACT | 2 refills | Status: DC | PRN

## 2020-04-23 MED ORDER — AZITHROMYCIN 250 MG PO TABS: ORAL_TABLET | ORAL | 0 refills | Status: DC

## 2020-04-24 MED ORDER — GABAPENTIN 300 MG PO CAPS: ORAL_CAPSULE | ORAL | 2 refills | Status: DC

## 2020-04-24 NOTE — Addendum Note (Signed)
Addended by: Caro Hight on: 04/24/2020 10:39 AM   Modules accepted: Orders

## 2020-04-27 ENCOUNTER — Other Ambulatory Visit: Payer: Self-pay | Admitting: Physical Medicine & Rehabilitation

## 2020-04-27 DIAGNOSIS — M79609 Pain in unspecified limb: Secondary | ICD-10-CM

## 2020-04-27 DIAGNOSIS — R202 Paresthesia of skin: Secondary | ICD-10-CM

## 2020-05-12 ENCOUNTER — Other Ambulatory Visit: Payer: Self-pay

## 2020-05-12 ENCOUNTER — Encounter: Payer: Self-pay | Admitting: Internal Medicine

## 2020-05-12 ENCOUNTER — Ambulatory Visit: Payer: Medicare Other | Attending: Internal Medicine | Admitting: Family

## 2020-05-12 VITALS — BP 144/88 | HR 68 | Resp 16 | Wt 148.8 lb

## 2020-05-12 DIAGNOSIS — I10 Essential (primary) hypertension: Secondary | ICD-10-CM

## 2020-05-12 DIAGNOSIS — R7989 Other specified abnormal findings of blood chemistry: Secondary | ICD-10-CM

## 2020-05-12 DIAGNOSIS — M542 Cervicalgia: Secondary | ICD-10-CM

## 2020-05-12 DIAGNOSIS — F17200 Nicotine dependence, unspecified, uncomplicated: Secondary | ICD-10-CM

## 2020-05-12 DIAGNOSIS — D649 Anemia, unspecified: Secondary | ICD-10-CM

## 2020-05-12 DIAGNOSIS — H2589 Other age-related cataract: Secondary | ICD-10-CM

## 2020-05-12 DIAGNOSIS — E7800 Pure hypercholesterolemia, unspecified: Secondary | ICD-10-CM | POA: Diagnosis not present

## 2020-05-12 DIAGNOSIS — G8929 Other chronic pain: Secondary | ICD-10-CM

## 2020-05-12 DIAGNOSIS — S14129D Central cord syndrome at unspecified level of cervical spinal cord, subsequent encounter: Secondary | ICD-10-CM | POA: Diagnosis not present

## 2020-05-12 DIAGNOSIS — M549 Dorsalgia, unspecified: Secondary | ICD-10-CM

## 2020-05-12 DIAGNOSIS — F1010 Alcohol abuse, uncomplicated: Secondary | ICD-10-CM

## 2020-05-12 DIAGNOSIS — G8250 Quadriplegia, unspecified: Secondary | ICD-10-CM

## 2020-05-12 DIAGNOSIS — U071 COVID-19: Secondary | ICD-10-CM

## 2020-05-12 DIAGNOSIS — C641 Malignant neoplasm of right kidney, except renal pelvis: Secondary | ICD-10-CM

## 2020-05-12 MED ORDER — ATORVASTATIN CALCIUM 10 MG PO TABS: 10.0000 mg | ORAL_TABLET | Freq: Every day | ORAL | 0 refills | Status: DC

## 2020-05-12 MED ORDER — AMLODIPINE BESYLATE 5 MG PO TABS: ORAL_TABLET | ORAL | 0 refills | Status: DC

## 2020-05-12 NOTE — Progress Notes (Signed)
Patient ID: SHOWN DISSINGER, male    DOB: April 16, 1961  MRN: [id number]  CC: Hypertension  Subjective: Patrick Romero is a 59 y.o. male with history of atherosclerosis of aorta, hypertension, tetraparesis, central cord syndrome, renal carcinoma right, prostate cancer, alcohol intoxication, and AIDS-related complex who presents for chronic conditions follow-up.  1. HYPERTENSION FOLLOW-UP: 01/10/2020: Visit with Dr. Wynetta Emery. Close to goal. Continued on Amlodipine.  05/12/2020: Ran out of medication in august  Currently taking: see medication list Have you taken your blood pressure medication today: []  Yes [x]  No, ran out of medication in August 2021   Med Adherence: []  Yes    [x]  No Medication side effects: []  Yes    [x]  No Adherence with salt restriction: [x]  Yes    []  No Exercise: Yes [x]  No []  Home Monitoring?: [x]  Yes    []  No Monitoring Frequency: []  Yes    [x]  No Home BP results range: []  Yes    [x]  No  Smoking [x]  Yes, sometimes (1 pack lasts 3 days) SOB? [x]  Yes, sometimes with exertion Chest Pain?: []  Yes    [x]  No Leg swelling?: []  Yes    [x]  No Headaches?: [x]  Yes, related to back and neck problems history Dizziness? []  Yes    [x]  No  2. CHOLESTEROL FOLLOW-UP: 01/10/2020: Visit with Dr. Wynetta Emery. Continued on Atorvastatin. Lipid panel obtained.   05/12/2020: Last Lipid Panel results:  HDL  Date Value Ref Range Status  01/10/2020 67 >39 mg/dL Final   Triglycerides  Date Value Ref Range Status  03/23/2020 211 (H) <150 mg/dL Final    Comment:    Performed at [facility], [place] [address] [place], [place] [address]    Med Adherence: [x]  Yes    []  No Medication side effects: []  Yes    [x]  No Muscle aches:  []  Yes    [x]  No Diet Adherence: []  Yes    [x]  No  3. CHRONIC NECK AND BACK PAIN FOLLOW-UP: 04/23/2020: Visit with physician assistant Thereasa Solo. Referred to Pain Clinic.   05/12/2020: Has appointment at Pain Clinic in 2 days.  4. CENTRAL CORD SYNDROME,  [place] FOLLOW-UP: 04/23/2020: Visit with physician assistant Thereasa Solo. Referred to Pain Clinic.   05/12/2020: Has appointment at Pain Clinic in 2 days.  5. QUADRIPARESIS FOLLOW-UP: 01/10/2020: Visit with Dr. Wynetta Emery. Referred to Physical Therapy per patient request for strengthening.  05/12/2020: Reports has not been to PT since early 2020 because of the pandemic.  6. RENAL CELL ADENOCARCINOMA FOLLOW-UP: 01/10/2020: Visit with Dr. Wynetta Emery. Followed by [facility] at that time.  05/12/2020: Has appointment with [facility] next in December 2021.   7.CATARACTS OF BOTH EYES FOLLOW-UP: 04/23/2020: Visit with physician assistant Thereasa Solo. Referred to Ophthalmology.  05/12/2020: Currently using readers glasses. Says Dr. Katy Fitch will not be seeing patients until January 2022.  8. TOBACCO DEPENDENCE FOLLOW-UP: 01/10/2020: Visit with Dr. Wynetta Emery. Advised to quit.   05/12/2020: Currently smoking 1 pack every 3 days and no desire to quit smoking.  9. ALCOHOL DISORDER FOLLOW-UP: 01/10/2020: Visit with Dr. Wynetta Emery. Advised not to drink more than 2 beers in 1 setting or in a day.   05/12/2020: Currently drinking a 12 pack of beer on the weekends socially.  10. COVID-19 FOLLOW-UP: 04/23/2020: Visit with physician assistant Thereasa Solo. Improved. Prescribed Azithromycin and Albuterol inhaler.   05/12/2020: Using oxygen 2 liters sparingly usually only with severe cough productive of clear phlegm. Oxygen not present with patient during this visit. Using albuterol inhaler only  as needed. Shortness of breath only with exertion. May use over-the-counter Dayquil or Nyquil as needed. Finished prescribed Azithromycin.   Patient Active Problem List   Diagnosis Date Noted  . COVID-19 03/23/2020  . 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 Spastic hemiparesis of right dominant side ([place]) 06/12/2019  . Spasticity 03/27/2019  . Depression 05/29/2018  . Prostate cancer ([place]) 09/13/2017  . Hypertension  02/17/2017  . Renal carcinoma, right ([place]) 11/23/2016  . Atherosclerosis of aorta ([place]) 11/23/2016  . 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 Dysesthesia   . History of syncope   . Tetraparesis ([place])   . Central cord syndrome ([place])   . Anemia   . ETOH abuse   . Neuropathic pain   . Neck pain   . Chronic neck and back pain 09/01/2016  . Alcohol intoxication ([place]) 09/01/2016  . Tobacco abuse 09/01/2016     Current Outpatient Medications on File Prior to Visit  Medication Sig Dispense Refill  . albuterol (VENTOLIN HFA) 108 (90 Base) MCG/ACT inhaler Inhale 2 puffs into the lungs every 6 (six) hours as needed for wheezing or shortness of breath. 8 g 2  . amitriptyline (ELAVIL) 25 MG tablet Take 1 tablet (25 mg total) by mouth at bedtime. 30 tablet 1  . aspirin EC 81 MG tablet Take 1 tablet (81 mg total) by mouth daily. 100 tablet 0  . azithromycin (ZITHROMAX) 250 MG tablet Take 2 today then 1 daily (Patient not taking: Reported on 05/12/2020) 6 tablet 0  . baclofen (LIORESAL) 20 MG tablet TAKE 1 TABLET BY MOUTH 4 TIMES A DAY (Patient taking differently: Take 20 mg by mouth 4 (four) times daily. ) 120 tablet 1  . gabapentin (NEURONTIN) 300 MG capsule TAKE 3 CAPSULES BY MOUTH 3 TIMES A DAY 270 capsule 2  . NARCAN 4 MG/0.1ML LIQD nasal spray kit Place 1 spray into the nose once. As needed    . tiZANidine (ZANAFLEX) 2 MG tablet Take 1-2 tablets (2-4 mg total) by mouth 4 (four) times daily. (Take 2 at bedtime) 150 tablet 5   No current facility-administered medications on file prior to visit.    No Known Allergies  Social History   Socioeconomic History  . Marital status: Single    Spouse name: Not on file  . Number of children: Not on file  . Years of education: Not on file  . Highest education level: Not on file  Occupational History    Comment: disabled  Tobacco Use  . Smoking status: Current Every Day Smoker    Packs/day: 0.25    Types: Cigarettes  . Smokeless  tobacco: Never Used  . Tobacco comment: pt is also using nicotine patch  Vaping Use  . Vaping Use: Never used  Substance and Sexual Activity  . Alcohol use: Yes    Comment: occ beer   . Drug use: No  . Sexual activity: Yes  Other Topics Concern  . Not on file  Social History Narrative   Lives with mother, son, sister   Caffeine- none   Social Determinants of Health   Financial Resource Strain:   . Difficulty of Paying Living Expenses: Not on file  Food Insecurity:   . Worried About Charity fundraiser in the Last Year: Not on file  . Ran Out of Food in the Last Year: Not on file  Transportation Needs:   . Lack of Transportation (Medical): Not on file  . Lack of Transportation (Non-Medical): Not on file  Physical Activity:   . Days of Exercise per Week: Not on file  . Minutes of Exercise per Session: Not on file  Stress:   . Feeling of Stress : Not on file  Social Connections:   . Frequency of Communication with Friends and Family: Not on file  . Frequency of Social Gatherings with Friends and Family: Not on file  . Attends Religious Services: Not on file  . Active Member of Clubs or Organizations: Not on file  . Attends Archivist Meetings: Not on file  . Marital Status: Not on file  Intimate Partner Violence:   . Fear of Current or Ex-Partner: Not on file  . Emotionally Abused: Not on file  . Physically Abused: Not on file  . Sexually Abused: Not on file    Family History  Problem Relation Age of Onset  . Hypertension Mother   . Lung cancer Father   . COPD Sister     Past Surgical History:  Procedure Laterality Date  . ROBOT ASSISTED LAPAROSCOPIC RADICAL PROSTATECTOMY N/A 09/13/2017   Procedure: XI ROBOTIC ASSISTED LAPAROSCOPIC RADICAL PROSTATECTOMY, BILATERAL PELVIC LYMPH NODE DISSECTION;  Surgeon: [name], MD;  Location: [facility];  Service: Urology;  Laterality: N/A;  . ROBOTIC ASSITED PARTIAL NEPHRECTOMY Right 01/04/2017   Procedure: XI ROBOTIC  ASSITED PARTIAL NEPHRECTOMY;  Surgeon: [name], MD;  Location: [facility];  Service: Urology;  Laterality: Right;    ROS: Review of Systems Negative except as stated above  PHYSICAL EXAM: BP (!) 144/88   Pulse 68   Resp 16   Wt 148 lb 12.8 oz (67.5 kg)   SpO2 95%   BMI 20.18 kg/m   Physical Exam General appearance - alert, well appearing, and in no distress and oriented to person, place, and time Mental status - alert, oriented to person, place, and time, normal mood, behavior, speech, dress, motor activity, and thought processes Neck - supple, no significant adenopathy Lymphatics - no palpable lymphadenopathy, no hepatosplenomegaly Chest - clear to auscultation, no wheezes, rales or rhonchi, symmetric air entry, no tachypnea, retractions or cyanosis Heart - normal rate, regular rhythm, normal S1, S2, no murmurs, rubs, clicks or gallops Neurological - alert, oriented, normal speech, no focal findings or movement disorder noted, cranial nerves II through XII intact, normal muscle tone, no tremors, strength 5/5  ASSESSMENT AND PLAN: 1. Essential hypertension: - Blood pressure not at goal during today's visit. Patient asymptomatic without chest pressure, chest pain, palpitations, and shortness of breath. - Patient has been without blood pressure medications since August 2021 because he ran out of medication.  - Continue Amlodipine as prescribed. - Follow-up with in 4 weeks with clinical pharmacist for blood pressure check. Write down your blood pressure readings each day and bring those results along with your home blood pressure monitor to your appointment. Medications may be adjusted at that time if needed. - Counseled on blood pressure goal of less than 130/80, low-sodium, DASH diet, medication compliance, 150 minutes of moderate intensity exercise per week as tolerated. Discussed medication compliance, adverse effects. - CMP to check kidney function and electrolyte balance.  -  Follow-up with primary physician in 3 months or sooner if needed.  - Comprehensive metabolic panel - amLODipine (NORVASC) 5 MG tablet; TAKE 1 TABLET BY MOUTH EVERY DAY  Dispense: 90 tablet; Refill: 0 - Amb Referral to Clinical Pharmacist  2. Pure hypercholesterolemia: - Practice low-fat heart healthy diet and at least 150 minutes of moderate intensity exercise weekly as tolerated.  -  Continue Atorvastatin as prescribed.  - Follow-up with primary physician in 3 months or sooner if needed. - atorvastatin (LIPITOR) 10 MG tablet; Take 1 tablet (10 mg total) by mouth daily.  Dispense: 90 tablet; Refill: 0  3. Chronic neck and back pain: - Keep all scheduled appointments with Pain Clinic. Patient says he has appointment in 2 days.  4. Central cord syndrome, subsequent encounter [facility]): - Keep all scheduled appointments with Pain Clinic. Patient says he has appointment in 2 days.  5. Quadriparesis [facility]): - Keep all appointments with Physical Therapy.  6. Renal cell adenocarcinoma, right [facility]): - Keep all appointments with [facility]. Patient says he has appointment in December 2021.  7. Other age-related cataract of both eyes: - Keep all appointments with Ophthalmology. Patient says he plans to make appointment at the beginning of 2022.  8. Tobacco dependence: - Counseled to quit.  Discussed health risk associated with smoking and cessation methods including NRT, Chantix, Buproprion. Patient not ready to give a trial of quitting. Less than 5 minutes spent on counseling.  9. Alcohol use disorder, mild, in controlled environment: - Advised patient to not drink more than 2 beers in 1 setting or in a day.  10. COVID-19: - Improved and relatively back to normal. Occasional use of oxygen 2 liters if having a cough and hasn't used recently. Patient presents to visit today with oxygen. Shortness of breath only with exertion.  11. Anemia, unspecified type: - CBC to check blood count.  - CMP  to check kidney function, liver function, and electrolyte balance. - CBC - Comprehensive metabolic panel  12. Elevated LFTs: - CMP to check kidney function, liver function, and electrolyte balance. - Comprehensive metabolic panel  Patient was given the opportunity to ask questions.  Patient verbalized understanding of the plan and was able to repeat key elements of the plan. Patient was given clear instructions to go to Emergency Department or return to medical center if symptoms don't improve, worsen, or new problems develop.The patient verbalized understanding.   Orders Placed This Encounter  Procedures  . CBC  . Comprehensive metabolic panel  . Amb Referral to Clinical Pharmacist     Requested Prescriptions   Signed Prescriptions Disp Refills  . atorvastatin (LIPITOR) 10 MG tablet 90 tablet 0    Sig: Take 1 tablet (10 mg total) by mouth daily.  Marland Kitchen amLODipine (NORVASC) 5 MG tablet 90 tablet 0    Sig: TAKE 1 TABLET BY MOUTH EVERY DAY    Return in about 3 months (around 08/12/2020) for Dr. Wynetta Emery and 1 month with Lurena Joiner.  Camillia Herter, NP

## 2020-05-12 NOTE — Progress Notes (Signed)
Pt states he is having pain coming from his neck and b/l shoulders

## 2020-05-12 NOTE — Patient Instructions (Addendum)
Continue Amlodipine for high blood pressure. Atorvastatin for high cholesterol. Keep appointments with [facility]. Lab today. Blood pressure check in 1 month with clinical pharmacist. Follow-up with primary physician in 3 months or sooner if needed. Hypertension, Adult Hypertension is another name for high blood pressure. High blood pressure forces your heart to work harder to pump blood. This can cause problems over time. There are two numbers in a blood pressure reading. There is a top number (systolic) over a bottom number (diastolic). It is best to have a blood pressure that is below 120/80. Healthy choices can help lower your blood pressure, or you may need medicine to help lower it. What are the causes? The cause of this condition is not known. Some conditions may be related to high blood pressure. What increases the risk?  Smoking.  Having type 2 diabetes mellitus, high cholesterol, or both.  Not getting enough exercise or physical activity.  Being overweight.  Having too much fat, sugar, calories, or salt (sodium) in your diet.  Drinking too much alcohol.  Having long-term (chronic) kidney disease.  Having a family history of high blood pressure.  Age. Risk increases with age.  Race. You may be at higher risk if you are African American.  Gender. Men are at higher risk than women before age 42. After age 79, women are at higher risk than men.  Having obstructive sleep apnea.  Stress. What are the signs or symptoms?  High blood pressure may not cause symptoms. Very high blood pressure (hypertensive crisis) may cause: ? Headache. ? Feelings of worry or nervousness (anxiety). ? Shortness of breath. ? Nosebleed. ? A feeling of being sick to your stomach (nausea). ? Throwing up (vomiting). ? Changes in how you see. ? Very bad chest pain. ? Seizures. How is this treated?  This condition is treated by making healthy lifestyle changes, such  as: ? Eating healthy foods. ? Exercising more. ? Drinking less alcohol.  Your health care provider may prescribe medicine if lifestyle changes are not enough to get your blood pressure under control, and if: ? Your top number is above 130. ? Your bottom number is above 80.  Your personal target blood pressure may vary. Follow these instructions at home: Eating and drinking   If told, follow the DASH eating plan. To follow this plan: ? Fill one half of your plate at each meal with fruits and vegetables. ? Fill one fourth of your plate at each meal with whole grains. Whole grains include whole-wheat pasta, brown rice, and whole-grain bread. ? Eat or drink low-fat dairy products, such as skim milk or low-fat yogurt. ? Fill one fourth of your plate at each meal with low-fat (lean) proteins. Low-fat proteins include fish, chicken without skin, eggs, beans, and tofu. ? Avoid fatty meat, cured and processed meat, or chicken with skin. ? Avoid pre-made or processed food.  Eat less than 1,500 mg of salt each day.  Do not drink alcohol if: ? Your doctor tells you not to drink. ? You are pregnant, may be pregnant, or are planning to become pregnant.  If you drink alcohol: ? Limit how much you use to:  0-1 drink a day for women.  0-2 drinks a day for men. ? Be aware of how much alcohol is in your drink. In the [place] one drink equals one 12 oz bottle of beer (355 mL), one 5 oz glass of wine (148 mL), or one 1 oz glass of  hard liquor (44 mL). Lifestyle   Work with your doctor to stay at a healthy weight or to lose weight. Ask your doctor what the best weight is for you.  Get at least 30 minutes of exercise most days of the week. This may include walking, swimming, or biking.  Get at least 30 minutes of exercise that strengthens your muscles (resistance exercise) at least 3 days a week. This may include lifting weights or doing Pilates.  Do not use any products that contain nicotine or  tobacco, such as cigarettes, e-cigarettes, and chewing tobacco. If you need help quitting, ask your doctor.  Check your blood pressure at home as told by your doctor.  Keep all follow-up visits as told by your doctor. This is important. Medicines  Take over-the-counter and prescription medicines only as told by your doctor. Follow directions carefully.  Do not skip doses of blood pressure medicine. The medicine does not work as well if you skip doses. Skipping doses also puts you at risk for problems.  Ask your doctor about side effects or reactions to medicines that you should watch for. Contact a doctor if you:  Think you are having a reaction to the medicine you are taking.  Have headaches that keep coming back (recurring).  Feel dizzy.  Have swelling in your ankles.  Have trouble with your vision. Get help right away if you:  Get a very bad headache.  Start to feel mixed up (confused).  Feel weak or numb.  Feel faint.  Have very bad pain in your: ? Chest. ? Belly (abdomen).  Throw up more than once.  Have trouble breathing. Summary  Hypertension is another name for high blood pressure.  High blood pressure forces your heart to work harder to pump blood.  For most people, a normal blood pressure is less than 120/80.  Making healthy choices can help lower blood pressure. If your blood pressure does not get lower with healthy choices, you may need to take medicine. This information is not intended to replace advice given to you by your health care provider. Make sure you discuss any questions you have with your health care provider. Document Revised: 03/07/2018 Document Reviewed: 03/07/2018 Elsevier Patient Education  2020 [employer].

## 2020-05-13 LAB — COMPREHENSIVE METABOLIC PANEL
ALT: 17 IU/L (ref 0–44)
AST: 19 IU/L (ref 0–40)
Albumin/Globulin Ratio: 1.3 (ref 1.2–2.2)
Albumin: 4.4 g/dL (ref 3.8–4.9)
Alkaline Phosphatase: 70 IU/L (ref 44–121)
BUN/Creatinine Ratio: 14 (ref 9–20)
BUN: 12 mg/dL (ref 6–24)
Bilirubin Total: 0.3 mg/dL (ref 0.0–1.2)
CO2: 23 mmol/L (ref 20–29)
Calcium: 9.8 mg/dL (ref 8.7–10.2)
Chloride: 102 mmol/L (ref 96–106)
Creatinine, Ser: 0.88 mg/dL (ref 0.76–1.27)
GFR calc Af Amer: 109 mL/min/{1.73_m2} (ref >59)
GFR calc non Af Amer: 94 mL/min/{1.73_m2} (ref >59)
Globulin, Total: 3.3 g/dL (ref 1.5–4.5)
Glucose: 84 mg/dL (ref 65–99)
Potassium: 4.8 mmol/L (ref 3.5–5.2)
Sodium: 138 mmol/L (ref 134–144)
Total Protein: 7.7 g/dL (ref 6.0–8.5)

## 2020-05-13 LAB — CBC
Hematocrit: 34.1 % — ABNORMAL LOW (ref 37.5–51.0)
Hemoglobin: 11.1 g/dL — ABNORMAL LOW (ref 13.0–17.7)
MCH: 25.5 pg — ABNORMAL LOW (ref 26.6–33.0)
MCHC: 32.6 g/dL (ref 31.5–35.7)
MCV: 78 fL — ABNORMAL LOW (ref 79–97)
Platelets: 450 10*3/uL (ref 150–450)
RBC: 4.36 x10E6/uL (ref 4.14–5.80)
RDW: 12.7 % (ref 11.6–15.4)
WBC: 7.6 10*3/uL (ref 3.4–10.8)

## 2020-05-15 NOTE — Addendum Note (Signed)
Addended by: Camillia Herter on: 05/15/2020 09:52 PM   Modules accepted: Orders

## 2020-05-15 NOTE — Progress Notes (Signed)
Please call patient with update.   Kidney function normal.   Liver function normal.   Iron panel added.

## 2020-05-17 ENCOUNTER — Telehealth: Payer: Self-pay | Admitting: Internal Medicine

## 2020-05-17 NOTE — Telephone Encounter (Signed)
PC placed to pt today.  I left VMM informing him that I see he did not get flu shot on recent visit with NP.  Advise that he can come as nurse only visit to get flu shot this wk if he wants it or can get it when he sees clinical pharmacist next mth.

## 2020-05-20 ENCOUNTER — Telehealth: Payer: Self-pay

## 2020-05-20 LAB — IRON,TIBC AND FERRITIN PANEL
Ferritin: 139 ng/mL (ref 30–400)
Iron Saturation: 17 % (ref 15–55)
Iron: 54 ug/dL (ref 38–169)
Total Iron Binding Capacity: 311 ug/dL (ref 250–450)
UIBC: 257 ug/dL (ref 111–343)

## 2020-05-20 LAB — SPECIMEN STATUS REPORT

## 2020-05-20 NOTE — Telephone Encounter (Signed)
Contacted pt to go over lab results pt is aware

## 2020-05-21 NOTE — Telephone Encounter (Signed)
Pt reported that he received his flu vaccine at [facility] already on 03-25-20 and 1st dose of Pfizer at [facility] on 05-13-20, both documented in pt's chart.

## 2020-05-22 ENCOUNTER — Telehealth: Payer: Self-pay

## 2020-05-22 MED ORDER — FERROUS SULFATE 325 (65 FE) MG PO TABS: ORAL_TABLET | ORAL | 0 refills | Status: DC

## 2020-05-22 NOTE — Progress Notes (Signed)
Please call patient with update.   Patient has anemia. An iron pill has been prescribed for this and sent to the pharmacy on file.   Will follow-up at next appointment with primary physician.

## 2020-05-22 NOTE — Telephone Encounter (Signed)
Contacted pt to go over lab results pt didn't answer lvm  

## 2020-05-22 NOTE — Addendum Note (Signed)
Addended by: Camillia Herter on: 05/22/2020 04:06 PM   Modules accepted: Orders

## 2020-06-11 ENCOUNTER — Ambulatory Visit: Payer: Medicare Other | Admitting: Pharmacist

## 2020-06-12 ENCOUNTER — Other Ambulatory Visit: Payer: Self-pay | Admitting: Physical Medicine & Rehabilitation

## 2020-06-12 DIAGNOSIS — S14129S Central cord syndrome at unspecified level of cervical spinal cord, sequela: Secondary | ICD-10-CM

## 2020-06-18 ENCOUNTER — Other Ambulatory Visit: Payer: Self-pay

## 2020-06-18 ENCOUNTER — Ambulatory Visit: Payer: Medicare Other | Attending: Internal Medicine | Admitting: Pharmacist

## 2020-06-18 ENCOUNTER — Encounter: Payer: Self-pay | Admitting: Pharmacist

## 2020-06-18 VITALS — BP 107/72

## 2020-06-18 DIAGNOSIS — I10 Essential (primary) hypertension: Secondary | ICD-10-CM

## 2020-06-18 NOTE — Progress Notes (Signed)
° °  S:    Patient arrives in good spirits. Presents to the clinic for hypertension evaluation, counseling, and management. Patient was referred and last seen by Durene Fruits on 05/12/2020. BP was elevated at that visit, but pt was out of his amlodipine.   Medication adherence reported.  Current BP Medications include:  Amlodipine 5 mg daily   Dietary habits include: compliant with salt restriction; denies drinking caffeine  Exercise habits include: limited  Family / Social history:  Fhx: HTN, lung cancer, COPD Tobacco: current 0.25 PPD smoker  Alcohol: endorses occasionally use   O:  Vitals:   06/18/20 1040  BP: 107/72    Home BP readings: none   Last 3 Office BP readings: BP Readings from Last 3 Encounters:  06/18/20 107/72  05/12/20 (!) 144/88  03/25/20 101/65    BMET    Component Value Date/Time   NA 138 05/12/2020 1107   K 4.8 05/12/2020 1107   CL 102 05/12/2020 1107   CO2 23 05/12/2020 1107   GLUCOSE 84 05/12/2020 1107   GLUCOSE 121 (H) 03/25/2020 0500   BUN 12 05/12/2020 1107   CREATININE 0.88 05/12/2020 1107   CALCIUM 9.8 05/12/2020 1107   GFRNONAA 94 05/12/2020 1107   GFRAA 109 05/12/2020 1107    Renal function: CrCl cannot be calculated (Patient's most recent lab result is older than the maximum 21 days allowed.).  Clinical ASCVD:  The 10-year ASCVD risk score Mikey Bussing DC Brooke Bonito., et al., 2013) is: 14.2%   Values used to calculate the score:     Age: 59 years     Sex: Male     Is Non-Hispanic African American: Yes     Diabetic: No     Tobacco smoker: Yes     Systolic Blood Pressure: 119 mmHg     Is BP treated: Yes     HDL Cholesterol: 67 mg/dL     Total Cholesterol: 169 mg/dL  A/P: Hypertension longstanding currently controlled on current medications. BP Goal = < 130/80 mmHg. Medication adherence reported.  -Continued current regimen.  -Counseled on lifestyle modifications for blood pressure control including reduced dietary sodium, increased  exercise, adequate sleep.  Results reviewed and written information provided.   Total time in face-to-face counseling 15 minutes.   F/U Clinic Visit with PCP.   Benard Halsted, PharmD, [place], [place] [phone]

## 2020-07-12 ENCOUNTER — Other Ambulatory Visit: Payer: Self-pay | Admitting: Physical Medicine & Rehabilitation

## 2020-07-12 DIAGNOSIS — S14129S Central cord syndrome at unspecified level of cervical spinal cord, sequela: Secondary | ICD-10-CM

## 2020-07-27 ENCOUNTER — Other Ambulatory Visit: Payer: Self-pay | Admitting: Physical Medicine & Rehabilitation

## 2020-07-27 DIAGNOSIS — S14129S Central cord syndrome at unspecified level of cervical spinal cord, sequela: Secondary | ICD-10-CM

## 2020-08-04 ENCOUNTER — Other Ambulatory Visit: Payer: Self-pay | Admitting: Family

## 2020-08-04 DIAGNOSIS — I10 Essential (primary) hypertension: Secondary | ICD-10-CM

## 2020-08-12 ENCOUNTER — Encounter: Payer: Medicare Other | Admitting: Physical Medicine & Rehabilitation

## 2020-08-13 ENCOUNTER — Ambulatory Visit: Payer: Medicare Other | Admitting: Internal Medicine

## 2020-08-20 ENCOUNTER — Ambulatory Visit: Payer: Medicare Other | Admitting: Internal Medicine

## 2020-10-07 ENCOUNTER — Encounter: Payer: Medicare Other | Attending: Physical Medicine & Rehabilitation | Admitting: Physical Medicine & Rehabilitation

## 2020-10-10 ENCOUNTER — Other Ambulatory Visit: Payer: Self-pay | Admitting: Physical Medicine & Rehabilitation

## 2020-10-10 DIAGNOSIS — S14129S Central cord syndrome at unspecified level of cervical spinal cord, sequela: Secondary | ICD-10-CM

## 2020-11-10 ENCOUNTER — Encounter: Payer: Self-pay | Admitting: Internal Medicine

## 2020-11-10 ENCOUNTER — Ambulatory Visit: Payer: Medicare Other | Attending: Internal Medicine | Admitting: Internal Medicine

## 2020-11-10 ENCOUNTER — Other Ambulatory Visit: Payer: Self-pay

## 2020-11-10 VITALS — BP 132/82 | HR 104 | Resp 16 | Wt 156.0 lb

## 2020-11-10 DIAGNOSIS — F17200 Nicotine dependence, unspecified, uncomplicated: Secondary | ICD-10-CM

## 2020-11-10 DIAGNOSIS — E785 Hyperlipidemia, unspecified: Secondary | ICD-10-CM | POA: Insufficient documentation

## 2020-11-10 DIAGNOSIS — E7800 Pure hypercholesterolemia, unspecified: Secondary | ICD-10-CM | POA: Diagnosis not present

## 2020-11-10 DIAGNOSIS — Z905 Acquired absence of kidney: Secondary | ICD-10-CM | POA: Insufficient documentation

## 2020-11-10 DIAGNOSIS — Z8546 Personal history of malignant neoplasm of prostate: Secondary | ICD-10-CM | POA: Insufficient documentation

## 2020-11-10 DIAGNOSIS — I700 Atherosclerosis of aorta: Secondary | ICD-10-CM | POA: Diagnosis not present

## 2020-11-10 DIAGNOSIS — Z85528 Personal history of other malignant neoplasm of kidney: Secondary | ICD-10-CM | POA: Insufficient documentation

## 2020-11-10 DIAGNOSIS — W19XXXS Unspecified fall, sequela: Secondary | ICD-10-CM | POA: Insufficient documentation

## 2020-11-10 DIAGNOSIS — Z7982 Long term (current) use of aspirin: Secondary | ICD-10-CM | POA: Diagnosis not present

## 2020-11-10 DIAGNOSIS — Z9079 Acquired absence of other genital organ(s): Secondary | ICD-10-CM | POA: Insufficient documentation

## 2020-11-10 DIAGNOSIS — F1011 Alcohol abuse, in remission: Secondary | ICD-10-CM | POA: Diagnosis not present

## 2020-11-10 DIAGNOSIS — G8254 Quadriplegia, C5-C7 incomplete: Secondary | ICD-10-CM | POA: Diagnosis not present

## 2020-11-10 DIAGNOSIS — Z79891 Long term (current) use of opiate analgesic: Secondary | ICD-10-CM | POA: Diagnosis not present

## 2020-11-10 DIAGNOSIS — I10 Essential (primary) hypertension: Secondary | ICD-10-CM | POA: Diagnosis not present

## 2020-11-10 DIAGNOSIS — F17210 Nicotine dependence, cigarettes, uncomplicated: Secondary | ICD-10-CM | POA: Insufficient documentation

## 2020-11-10 DIAGNOSIS — G8250 Quadriplegia, unspecified: Secondary | ICD-10-CM | POA: Diagnosis not present

## 2020-11-10 DIAGNOSIS — B20 Human immunodeficiency virus [HIV] disease: Secondary | ICD-10-CM

## 2020-11-10 DIAGNOSIS — Z9181 History of falling: Secondary | ICD-10-CM

## 2020-11-10 DIAGNOSIS — S14129S Central cord syndrome at unspecified level of cervical spinal cord, sequela: Secondary | ICD-10-CM | POA: Diagnosis not present

## 2020-11-10 DIAGNOSIS — Z87898 Personal history of other specified conditions: Secondary | ICD-10-CM

## 2020-11-10 DIAGNOSIS — Z79899 Other long term (current) drug therapy: Secondary | ICD-10-CM | POA: Diagnosis not present

## 2020-11-10 MED ORDER — AMLODIPINE BESYLATE 5 MG PO TABS: 1.0000 | ORAL_TABLET | Freq: Every day | ORAL | 1 refills | Status: DC

## 2020-11-10 MED ORDER — ATORVASTATIN CALCIUM 10 MG PO TABS: 10.0000 mg | ORAL_TABLET | Freq: Every day | ORAL | 1 refills | Status: DC

## 2020-11-10 NOTE — Progress Notes (Signed)
Patient ID: Patrick Romero, male    DOB: 04-05-1961  MRN: [id number]  CC: Hypertension   Subjective: Patrick Romero is a 60 y.o. 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  HTN:  No device to check BP.  [facility] Compliant with Norvasc and salt restriction Denies chest pain, shortness of breath, chronic headaches or lower extremity edema.  HL/aortic atherosclerosis: taking Lipitor and aspirin low-dose.  Denies any muscle cramps with Lipitor.  Tob dep: smokes 1/2 pk a day.  In the past he was at 1.5 pk/day.  Started smoking at age 29. Has nicotine patch and gum but has not committed to quitting fully.    Hx of Prostate and Renal CA cancer:  Saw Dr. Tammi Romero 09/2020.  Reports PSA  Level was not detectable.  Making good urine.  No hematuria  Central Cord Syndrome/Quadriapesis -Reports that nothing has change in terms of his mobility.  Worse in cloudy weather. Had near falls about 3 times this yr. Two occurred at home all at home where he tripped over his throw rug.  He does not have a slipped guard and throw rugs.  Uses his cane mainly outside and not in house.  Has appt with Dr. Naaman Romero later this mth Still see pain specialist at [facility] Dr. Stann Romero On Oxycodone and Patrick Romero.  Confirms he has Narcan at home  ETOH use: reports he has cut back a lot. he has stopped drinking beer.  Has 1-2 glasses wine 3 times a wk Patient Active Problem List   Diagnosis Date Noted  . COVID-19 03/23/2020  . 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 Spastic hemiparesis of right dominant side ([place]) 06/12/2019  . Spasticity 03/27/2019  . Depression 05/29/2018  . Prostate cancer ([place])  09/13/2017  . Hypertension 02/17/2017  . Renal carcinoma, right ([place]) 11/23/2016  . Atherosclerosis of aorta ([place]) 11/23/2016  . 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 Dysesthesia   . History of syncope   . Tetraparesis ([place])   . Central cord syndrome ([place])   . Anemia   . ETOH abuse   . Neuropathic pain   . Neck pain   . Chronic neck and back pain 09/01/2016  . Alcohol intoxication ([place]) 09/01/2016  . Tobacco abuse 09/01/2016     Current Outpatient Medications on File Prior to Visit  Medication Sig Dispense Refill  . albuterol (VENTOLIN HFA) 108 (90 Base) MCG/ACT inhaler Inhale 2 puffs into the lungs every 6 (six) hours as needed for wheezing or shortness of breath. 8 g 2  . amitriptyline (ELAVIL) 25 MG tablet Take 1 tablet (25 mg total) by mouth at bedtime. 30 tablet 1  . aspirin EC 81 MG tablet Take 1 tablet (81 mg total) by mouth daily. 100 tablet 0  . baclofen (LIORESAL) 20 MG tablet TAKE 1 TABLET BY MOUTH FOUR TIMES A DAY 360 tablet 1  . buprenorphine (BUTRANS) 5 MCG/HR PTWK 1 (ONE) PATCH ON SKIN WEEKLY    . ferrous sulfate 325 (65 FE) MG tablet Take 1 tablet every Monday, Wednesday, and Friday. 100 tablet 0  . gabapentin (NEURONTIN) 300 MG capsule TAKE 3 CAPSULES BY MOUTH 3 TIMES A DAY 270 capsule 2  . NARCAN 4 MG/0.1ML LIQD nasal spray kit  Place 1 spray into the nose once. As needed (Patient not taking: Reported on 11/10/2020)    . oxyCODONE-acetaminophen (PERCOCET) 10-325 MG tablet Take 1 tablet by mouth 3 (three) times daily.    Marland Kitchen tiZANidine (ZANAFLEX) 2 MG tablet TAKE 1-2 TABLETS (2-4 MG TOTAL) BY MOUTH 4 (FOUR) TIMES DAILY. (TAKE 2 AT BEDTIME) 450 tablet 1   No current facility-administered medications on file prior to visit.    No Known Allergies  Social History   Socioeconomic History  . Marital status: Single    Spouse name: Not on file  . Number of children: Not on file  . Years of education: Not on file  . Highest education level: Not on file   Occupational History    Comment: disabled  Tobacco Use  . Smoking status: Current Every Day Smoker    Packs/day: 0.25    Types: Cigarettes  . Smokeless tobacco: Never Used  . Tobacco comment: pt is also using nicotine patch  Vaping Use  . Vaping Use: Never used  Substance and Sexual Activity  . Alcohol use: Yes    Comment: occ beer   . Drug use: No  . Sexual activity: Yes  Other Topics Concern  . Not on file  Social History Narrative   Lives with mother, son, sister   Caffeine- none   Social Determinants of Health   Financial Resource Strain: Not on file  Food Insecurity: Not on file  Transportation Needs: Not on file  Physical Activity: Not on file  Stress: Not on file  Social Connections: Not on file  Intimate Partner Violence: Not on file    Family History  Problem Relation Age of Onset  . Hypertension Mother   . Lung cancer Father   . COPD Sister     Past Surgical History:  Procedure Laterality Date  . ROBOT ASSISTED LAPAROSCOPIC RADICAL PROSTATECTOMY N/A 09/13/2017   Procedure: XI ROBOTIC ASSISTED LAPAROSCOPIC RADICAL PROSTATECTOMY, BILATERAL PELVIC LYMPH NODE DISSECTION;  Surgeon: [name], MD;  Location: [facility];  Service: Urology;  Laterality: N/A;  . ROBOTIC ASSITED PARTIAL NEPHRECTOMY Right 01/04/2017   Procedure: XI ROBOTIC ASSITED PARTIAL NEPHRECTOMY;  Surgeon: [name], MD;  Location: [facility];  Service: Urology;  Laterality: Right;    ROS: Review of Systems Negative except as stated above  PHYSICAL EXAM: BP 132/82   Pulse (!) 104   Resp 16   Wt 156 lb (70.8 kg)   SpO2 95%   BMI 21.16 kg/m   Physical Exam General appearance - alert, well appearing, and in no distress Mental status - normal mood, behavior, speech, dress, motor activity, and thought processes Mouth - mucous membranes moist, pharynx normal without lesions Neck - supple, no significant adenopathy Chest - clear to auscultation, no wheezes, rales or rhonchi, symmetric  air entry Heart - normal rate, regular rhythm, normal S1, S2, no murmurs, rubs, clicks or gallops  Extremities -no lower extremity Neurologic: Grip 3+RT,  4+ left.  Power proximally and distally in the right upper and lower extremity 4/5, left upper and lower extremity 4+/5.  He walks with a quad cane  CMP Latest Ref Rng & Units 05/12/2020 03/25/2020 03/24/2020  Glucose 65 - 99 mg/dL 84 121(H) 144(H)  BUN 6 - 24 mg/dL 12 29(H) 28(H)  Creatinine 0.76 - 1.27 mg/dL 0.88 1.06 1.12  Sodium 134 - 144 mmol/L 138 138 133(L)  Potassium 3.5 - 5.2 mmol/L 4.8 4.2 4.5  Chloride 96 - 106 mmol/L 102 106 100  CO2  20 - 29 mmol/L 23 22 20(L)  Calcium 8.7 - 10.2 mg/dL 9.8 8.8(L) 8.8(L)  Total Protein 6.0 - 8.5 g/dL 7.7 6.5 6.8  Total Bilirubin 0.0 - 1.2 mg/dL 0.3 1.8(H) 1.7(H)  Alkaline Phos 44 - 121 IU/L 70 74 89  AST 0 - 40 IU/L 19 79(H) 186(H)  ALT 0 - 44 IU/L 17 115(H) 158(H)   Lipid Panel     Component Value Date/Time   CHOL 169 01/10/2020 1659   TRIG 211 (H) 03/23/2020 1255   HDL 67 01/10/2020 1659   CHOLHDL 2.5 01/10/2020 1659   LDLCALC 61 01/10/2020 1659    CBC    Component Value Date/Time   WBC 7.6 05/12/2020 1107   WBC 9.8 03/25/2020 0500   RBC 4.36 05/12/2020 1107   RBC 3.35 (L) 03/25/2020 0500   HGB 11.1 (L) 05/12/2020 1107   HCT 34.1 (L) 05/12/2020 1107   PLT 450 05/12/2020 1107   MCV 78 (L) 05/12/2020 1107   MCH 25.5 (L) 05/12/2020 1107   MCH 26.3 03/25/2020 0500   MCHC 32.6 05/12/2020 1107   MCHC 32.1 03/25/2020 0500   RDW 12.7 05/12/2020 1107   LYMPHSABS 1.5 03/25/2020 0500   LYMPHSABS 3.0 02/17/2017 1427   MONOABS 1.5 (H) 03/25/2020 0500   EOSABS 0.0 03/25/2020 0500   EOSABS 0.3 02/17/2017 1427   BASOSABS 0.0 03/25/2020 0500   BASOSABS 0.0 02/17/2017 1427    ASSESSMENT AND PLAN: 1. Essential hypertension Close to goal.  Continue amlodipine and low-salt diet - amLODipine (NORVASC) 5 MG tablet; Take 1 tablet (5 mg total) by mouth daily.  Dispense: 90 tablet; Refill:  1  2. Quadriparesis ([place]) 3. History of recent fall -Recommended patient uses his cane even when in door at home. Recommend purchasing slipped guards to put on his throw rugs at home to help prevent falls.  4. Pure hypercholesterolemia - atorvastatin (LIPITOR) 10 MG tablet; Take 1 tablet (10 mg total) by mouth daily.  Dispense: 90 tablet; Refill: 1  5. Aortic atherosclerosis ([facility]) Continue atorvastatin and aspirin  6. History of alcohol use disorder Commended him on cutting back.  Encouraged him to try to cut back even more to no more than 1 glass of wine 3 times a week instead of 2 glasses.  7. Tobacco dependence Commended him on cutting back.  Advised to quit.  Discussed health risks associated with smoking.  Patient not ready to give a trial of quitting.  8. History of renal cell cancer 9. History of prostate cancer In remission for both cancers.  Followed by urology.    Patient was given the opportunity to ask questions.  Patient verbalized understanding of the plan and was able to repeat key elements of the plan.   No orders of the defined types were placed in this encounter.    Requested Prescriptions   Signed Prescriptions Disp Refills  . atorvastatin (LIPITOR) 10 MG tablet 90 tablet 1    Sig: Take 1 tablet (10 mg total) by mouth daily.  Marland Kitchen amLODipine (NORVASC) 5 MG tablet 90 tablet 1    Sig: Take 1 tablet (5 mg total) by mouth daily.    Return in about 6 weeks (around 12/22/2020) for for his Medicare Wellness Visit.  Karle Plumber, MD, FACP

## 2020-11-10 NOTE — Patient Instructions (Signed)
Fall Prevention in the Home, Adult Falls can cause injuries and can happen to people of all ages. There are many things you can do to make your home safe and to help prevent falls. Ask for help when making these changes. What actions can I take to prevent falls? General Instructions  Use good lighting in all rooms. Replace any light bulbs that burn out.  Turn on the lights in dark areas. Use night-lights.  Keep items that you use often in easy-to-reach places. Lower the shelves around your home if needed.  Set up your furniture so you have a clear path. Avoid moving your furniture around.  Do not have throw rugs or other things on the floor that can make you trip.  Avoid walking on wet floors.  If any of your floors are uneven, fix them.  Add color or contrast paint or tape to clearly mark and help you see: ? Grab bars or handrails. ? First and last steps of staircases. ? Where the edge of each step is.  If you use a stepladder: ? Make sure that it is fully opened. Do not climb a closed stepladder. ? Make sure the sides of the stepladder are locked in place. ? Ask someone to hold the stepladder while you use it.  Know where your pets are when moving through your home. What can I do in the bathroom?  Keep the floor dry. Clean up any water on the floor right away.  Remove soap buildup in the tub or shower.  Use nonskid mats or decals on the floor of the tub or shower.  Attach bath mats securely with double-sided, nonslip rug tape.  If you need to sit down in the shower, use a plastic, nonslip stool.  Install grab bars by the toilet and in the tub and shower. Do not use towel bars as grab bars.      What can I do in the bedroom?  Make sure that you have a light by your bed that is easy to reach.  Do not use any sheets or blankets for your bed that hang to the floor.  Have a firm chair with side arms that you can use for support when you get dressed. What can I do in  the kitchen?  Clean up any spills right away.  If you need to reach something above you, use a step stool with a grab bar.  Keep electrical cords out of the way.  Do not use floor polish or wax that makes floors slippery. What can I do with my stairs?  Do not leave any items on the stairs.  Make sure that you have a light switch at the top and the bottom of the stairs.  Make sure that there are handrails on both sides of the stairs. Fix handrails that are broken or loose.  Install nonslip stair treads on all your stairs.  Avoid having throw rugs at the top or bottom of the stairs.  Choose a carpet that does not hide the edge of the steps on the stairs.  Check carpeting to make sure that it is firmly attached to the stairs. Fix carpet that is loose or worn. What can I do on the outside of my home?  Use bright outdoor lighting.  Fix the edges of walkways and driveways and fix any cracks.  Remove anything that might make you trip as you walk through a door, such as a raised step or threshold.  Trim any   bushes or trees on paths to your home.  Check to see if handrails are loose or broken and that both sides of all steps have handrails.  Install guardrails along the edges of any raised decks and porches.  Clear paths of anything that can make you trip, such as tools or rocks.  Have leaves, snow, or ice cleared regularly.  Use sand or salt on paths during winter.  Clean up any spills in your garage right away. This includes grease or oil spills. What other actions can I take?  Wear shoes that: ? Have a low heel. Do not wear high heels. ? Have rubber bottoms. ? Feel good on your feet and fit well. ? Are closed at the toe. Do not wear open-toe sandals.  Use tools that help you move around if needed. These include: ? Canes. ? Walkers. ? Scooters. ? Crutches.  Review your medicines with your doctor. Some medicines can make you feel dizzy. This can increase your chance  of falling. Ask your doctor what else you can do to help prevent falls. Where to find more information  Centers for Disease Control and Prevention, [employer]: www.cdc.gov  National Institute on Aging: www.nia.nih.gov Contact a doctor if:  You are afraid of falling at home.  You feel weak, drowsy, or dizzy at home.  You fall at home. Summary  There are many simple things that you can do to make your home safe and to help prevent falls.  Ways to make your home safe include removing things that can make you trip and installing grab bars in the bathroom.  Ask for help when making these changes in your home. This information is not intended to replace advice given to you by your health care provider. Make sure you discuss any questions you have with your health care provider. Document Revised: 01/29/2020 Document Reviewed: 01/29/2020 Elsevier Patient Education  2021 [employer]  

## 2020-11-11 ENCOUNTER — Other Ambulatory Visit: Payer: Self-pay

## 2020-11-11 ENCOUNTER — Encounter: Payer: Self-pay | Admitting: Physical Medicine & Rehabilitation

## 2020-11-11 ENCOUNTER — Encounter: Payer: Medicare Other | Attending: Physical Medicine & Rehabilitation | Admitting: Physical Medicine & Rehabilitation

## 2020-11-11 VITALS — BP 161/100 | HR 91 | Temp 98.8°F | Ht 72.0 in | Wt 156.4 lb

## 2020-11-11 DIAGNOSIS — M7501 Adhesive capsulitis of right shoulder: Secondary | ICD-10-CM

## 2020-11-11 DIAGNOSIS — S14129S Central cord syndrome at unspecified level of cervical spinal cord, sequela: Secondary | ICD-10-CM | POA: Diagnosis not present

## 2020-11-11 DIAGNOSIS — G8111 Spastic hemiplegia affecting right dominant side: Secondary | ICD-10-CM

## 2020-11-11 NOTE — Patient Instructions (Signed)
PLEASE FEEL FREE TO CALL OUR OFFICE WITH ANY PROBLEMS OR QUESTIONS ([phone])      
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
## 2020-11-22 ENCOUNTER — Emergency Department (HOSPITAL_COMMUNITY): Payer: Medicare Other

## 2020-11-22 ENCOUNTER — Observation Stay (HOSPITAL_COMMUNITY)
Admission: EM | Admit: 2020-11-22 | Discharge: 2020-11-23 | Disposition: A | Discharge status: Home / Self Care | Payer: Medicare Other | Attending: Internal Medicine | Admitting: Internal Medicine

## 2020-11-22 ENCOUNTER — Other Ambulatory Visit: Payer: Self-pay

## 2020-11-22 ENCOUNTER — Encounter (HOSPITAL_COMMUNITY): Payer: Self-pay

## 2020-11-22 DIAGNOSIS — I10 Essential (primary) hypertension: Secondary | ICD-10-CM | POA: Diagnosis present

## 2020-11-22 DIAGNOSIS — R55 Syncope and collapse: Secondary | ICD-10-CM | POA: Diagnosis not present

## 2020-11-22 DIAGNOSIS — Z8546 Personal history of malignant neoplasm of prostate: Secondary | ICD-10-CM | POA: Diagnosis not present

## 2020-11-22 DIAGNOSIS — Z79899 Other long term (current) drug therapy: Secondary | ICD-10-CM | POA: Insufficient documentation

## 2020-11-22 DIAGNOSIS — F17210 Nicotine dependence, cigarettes, uncomplicated: Secondary | ICD-10-CM | POA: Insufficient documentation

## 2020-11-22 DIAGNOSIS — Z8616 Personal history of COVID-19: Secondary | ICD-10-CM | POA: Diagnosis not present

## 2020-11-22 DIAGNOSIS — C649 Malignant neoplasm of unspecified kidney, except renal pelvis: Secondary | ICD-10-CM

## 2020-11-22 DIAGNOSIS — Z7982 Long term (current) use of aspirin: Secondary | ICD-10-CM | POA: Insufficient documentation

## 2020-11-22 DIAGNOSIS — R160 Hepatomegaly, not elsewhere classified: Secondary | ICD-10-CM

## 2020-11-22 DIAGNOSIS — F1010 Alcohol abuse, uncomplicated: Secondary | ICD-10-CM | POA: Diagnosis not present

## 2020-11-22 DIAGNOSIS — Z720 Tobacco use: Secondary | ICD-10-CM | POA: Diagnosis not present

## 2020-11-22 DIAGNOSIS — R140 Abdominal distension (gaseous): Secondary | ICD-10-CM

## 2020-11-22 DIAGNOSIS — Y902 Blood alcohol level of 40-59 mg/100 ml: Secondary | ICD-10-CM | POA: Diagnosis not present

## 2020-11-22 DIAGNOSIS — G8111 Spastic hemiplegia affecting right dominant side: Secondary | ICD-10-CM | POA: Diagnosis present

## 2020-11-22 DIAGNOSIS — C61 Malignant neoplasm of prostate: Secondary | ICD-10-CM | POA: Diagnosis present

## 2020-11-22 DIAGNOSIS — Z20822 Contact with and (suspected) exposure to covid-19: Secondary | ICD-10-CM | POA: Insufficient documentation

## 2020-11-22 DIAGNOSIS — Z85528 Personal history of other malignant neoplasm of kidney: Secondary | ICD-10-CM | POA: Insufficient documentation

## 2020-11-22 DIAGNOSIS — D649 Anemia, unspecified: Secondary | ICD-10-CM | POA: Diagnosis present

## 2020-11-22 DIAGNOSIS — M6281 Muscle weakness (generalized): Secondary | ICD-10-CM | POA: Insufficient documentation

## 2020-11-22 LAB — BASIC METABOLIC PANEL
Anion gap: 10 (ref 5–15)
BUN: 14 mg/dL (ref 6–20)
CO2: 21 mmol/L — ABNORMAL LOW (ref 22–32)
Calcium: 8.8 mg/dL — ABNORMAL LOW (ref 8.9–10.3)
Chloride: 104 mmol/L (ref 98–111)
Creatinine, Ser: 1.42 mg/dL — ABNORMAL HIGH (ref 0.61–1.24)
GFR, Estimated: 57 mL/min — ABNORMAL LOW (ref >60)
Glucose, Bld: 110 mg/dL — ABNORMAL HIGH (ref 70–99)
Potassium: 3.6 mmol/L (ref 3.5–5.1)
Sodium: 135 mmol/L (ref 135–145)

## 2020-11-22 LAB — CBC
HCT: 32.1 % — ABNORMAL LOW (ref 39.0–52.0)
Hemoglobin: 10 g/dL — ABNORMAL LOW (ref 13.0–17.0)
MCH: 25.4 pg — ABNORMAL LOW (ref 26.0–34.0)
MCHC: 31.2 g/dL (ref 30.0–36.0)
MCV: 81.5 fL (ref 80.0–100.0)
Platelets: 264 10*3/uL (ref 150–400)
RBC: 3.94 MIL/uL — ABNORMAL LOW (ref 4.22–5.81)
RDW: 13.1 % (ref 11.5–15.5)
WBC: 5 10*3/uL (ref 4.0–10.5)
nRBC: 0 % (ref 0.0–0.2)

## 2020-11-22 LAB — URINALYSIS, ROUTINE W REFLEX MICROSCOPIC
Bilirubin Urine: NEGATIVE
Glucose, UA: NEGATIVE mg/dL
Hgb urine dipstick: NEGATIVE
Ketones, ur: NEGATIVE mg/dL
Leukocytes,Ua: NEGATIVE
Nitrite: NEGATIVE
Protein, ur: NEGATIVE mg/dL
Specific Gravity, Urine: 1.005 (ref 1.005–1.030)
pH: 6 (ref 5.0–8.0)

## 2020-11-22 LAB — TROPONIN I (HIGH SENSITIVITY)
Troponin I (High Sensitivity): 5 ng/L (ref <18)
Troponin I (High Sensitivity): 4 ng/L (ref <18)

## 2020-11-22 LAB — CBG MONITORING, ED: Glucose-Capillary: 111 mg/dL — ABNORMAL HIGH (ref 70–99)

## 2020-11-22 MED ORDER — SODIUM CHLORIDE 0.9 % IV BOLUS
1000.0000 mL | Freq: Once | INTRAVENOUS | Status: AC
Start: 2020-11-22 — End: 2020-11-22
  Administered 2020-11-22: 1000 mL via INTRAVENOUS

## 2020-11-22 NOTE — ED Notes (Signed)
Urine Requested.  
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
## 2020-11-22 NOTE — ED Triage Notes (Signed)
Patient arrives with [facility], patient with two witnessed syncopal episode, reports drinking 7 beers tonight, friends deny that he hit his head, initial BP 80s, given 500 NS en route

## 2020-11-22 NOTE — ED Triage Notes (Signed)
Triage noted accidentally entered under Charlotte Crumb, NT, Phill Myron, RN was triage RN who completed triage assessment

## 2020-11-22 NOTE — ED Triage Notes (Signed)
EMS reports pt is from home. Having a few beers with friends, became lightheaded, several syncopal episodes, several episodes of vomiting. BP - 80/50, HR - 70, O2 sat- 98% on RA, CBG - 123.
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
## 2020-11-23 ENCOUNTER — Observation Stay (HOSPITAL_COMMUNITY): Payer: Medicare Other

## 2020-11-23 ENCOUNTER — Observation Stay (HOSPITAL_BASED_OUTPATIENT_CLINIC_OR_DEPARTMENT_OTHER): Payer: Medicare Other

## 2020-11-23 DIAGNOSIS — I10 Essential (primary) hypertension: Secondary | ICD-10-CM | POA: Diagnosis not present

## 2020-11-23 DIAGNOSIS — R160 Hepatomegaly, not elsewhere classified: Secondary | ICD-10-CM | POA: Diagnosis not present

## 2020-11-23 DIAGNOSIS — R55 Syncope and collapse: Secondary | ICD-10-CM

## 2020-11-23 DIAGNOSIS — F1010 Alcohol abuse, uncomplicated: Secondary | ICD-10-CM | POA: Diagnosis not present

## 2020-11-23 LAB — HEPATIC FUNCTION PANEL
ALT: 14 U/L (ref 0–44)
AST: 16 U/L (ref 15–41)
Albumin: 3.4 g/dL — ABNORMAL LOW (ref 3.5–5.0)
Alkaline Phosphatase: 49 U/L (ref 38–126)
Bilirubin, Direct: <0.1 mg/dL (ref 0.0–0.2)
Total Bilirubin: 0.6 mg/dL (ref 0.3–1.2)
Total Protein: 6.5 g/dL (ref 6.5–8.1)

## 2020-11-23 LAB — VITAMIN B12: Vitamin B-12: 323 pg/mL (ref 180–914)

## 2020-11-23 LAB — CBC WITH DIFFERENTIAL/PLATELET
Abs Immature Granulocytes: 0.02 10*3/uL (ref 0.00–0.07)
Basophils Absolute: 0 10*3/uL (ref 0.0–0.1)
Basophils Relative: 0 %
Eosinophils Absolute: 0.1 10*3/uL (ref 0.0–0.5)
Eosinophils Relative: 1 %
HCT: 30.8 % — ABNORMAL LOW (ref 39.0–52.0)
Hemoglobin: 9.8 g/dL — ABNORMAL LOW (ref 13.0–17.0)
Immature Granulocytes: 0 %
Lymphocytes Relative: 43 %
Lymphs Abs: 3.1 10*3/uL (ref 0.7–4.0)
MCH: 25.5 pg — ABNORMAL LOW (ref 26.0–34.0)
MCHC: 31.8 g/dL (ref 30.0–36.0)
MCV: 80.2 fL (ref 80.0–100.0)
Monocytes Absolute: 0.6 10*3/uL (ref 0.1–1.0)
Monocytes Relative: 8 %
Neutro Abs: 3.4 10*3/uL (ref 1.7–7.7)
Neutrophils Relative %: 48 %
Platelets: 280 10*3/uL (ref 150–400)
RBC: 3.84 MIL/uL — ABNORMAL LOW (ref 4.22–5.81)
RDW: 13.1 % (ref 11.5–15.5)
WBC: 7.2 10*3/uL (ref 4.0–10.5)
nRBC: 0 % (ref 0.0–0.2)

## 2020-11-23 LAB — RETICULOCYTES
Immature Retic Fract: 15.3 % (ref 2.3–15.9)
RBC.: 3.87 MIL/uL — ABNORMAL LOW (ref 4.22–5.81)
Retic Count, Absolute: 44.5 10*3/uL (ref 19.0–186.0)
Retic Ct Pct: 1.2 % (ref 0.4–3.1)

## 2020-11-23 LAB — FOLATE: Folate: 10.8 ng/mL (ref >5.9)

## 2020-11-23 LAB — ETHANOL: Alcohol, Ethyl (B): 47 mg/dL — ABNORMAL HIGH (ref <10)

## 2020-11-23 LAB — COMPREHENSIVE METABOLIC PANEL
ALT: 14 U/L (ref 0–44)
AST: 15 U/L (ref 15–41)
Albumin: 3.5 g/dL (ref 3.5–5.0)
Alkaline Phosphatase: 49 U/L (ref 38–126)
Anion gap: 8 (ref 5–15)
BUN: 11 mg/dL (ref 6–20)
CO2: 19 mmol/L — ABNORMAL LOW (ref 22–32)
Calcium: 8.7 mg/dL — ABNORMAL LOW (ref 8.9–10.3)
Chloride: 109 mmol/L (ref 98–111)
Creatinine, Ser: 1.04 mg/dL (ref 0.61–1.24)
GFR, Estimated: >60 mL/min (ref >60)
Glucose, Bld: 78 mg/dL (ref 70–99)
Potassium: 4.1 mmol/L (ref 3.5–5.1)
Sodium: 136 mmol/L (ref 135–145)
Total Bilirubin: 0.4 mg/dL (ref 0.3–1.2)
Total Protein: 6.5 g/dL (ref 6.5–8.1)

## 2020-11-23 LAB — ECHOCARDIOGRAM COMPLETE
Area-P 1/2: 4.6 cm2
Height: 72 in
S' Lateral: 2.2 cm
Weight: 2444.46 oz

## 2020-11-23 LAB — IRON AND TIBC
Iron: 96 ug/dL (ref 45–182)
Saturation Ratios: 29 % (ref 17.9–39.5)
TIBC: 326 ug/dL (ref 250–450)
UIBC: 230 ug/dL

## 2020-11-23 LAB — PHOSPHORUS: Phosphorus: 3.9 mg/dL (ref 2.5–4.6)

## 2020-11-23 LAB — LACTIC ACID, PLASMA: Lactic Acid, Venous: 1.9 mmol/L (ref 0.5–1.9)

## 2020-11-23 LAB — CK: Total CK: 172 U/L (ref 49–397)

## 2020-11-23 LAB — RESP PANEL BY RT-PCR (FLU A&B, COVID) ARPGX2
Influenza A by PCR: NEGATIVE
Influenza B by PCR: NEGATIVE
SARS Coronavirus 2 by RT PCR: NEGATIVE

## 2020-11-23 LAB — FERRITIN: Ferritin: 54 ng/mL (ref 24–336)

## 2020-11-23 LAB — TSH: TSH: 1.131 u[IU]/mL (ref 0.350–4.500)

## 2020-11-23 LAB — TROPONIN I (HIGH SENSITIVITY)
Troponin I (High Sensitivity): 5 ng/L (ref <18)
Troponin I (High Sensitivity): 5 ng/L (ref <18)

## 2020-11-23 LAB — D-DIMER, QUANTITATIVE: D-Dimer, Quant: 0.44 ug/mL-FEU (ref 0.00–0.50)

## 2020-11-23 LAB — MAGNESIUM: Magnesium: 1.8 mg/dL (ref 1.7–2.4)

## 2020-11-23 MED ORDER — AMLODIPINE BESYLATE 5 MG PO TABS
5.0000 mg | ORAL_TABLET | Freq: Every day | ORAL | Status: DC
  Administered 2020-11-23: 5 mg via ORAL
  Filled 2020-11-23: qty 1

## 2020-11-23 MED ORDER — ASPIRIN EC 81 MG PO TBEC
81.0000 mg | DELAYED_RELEASE_TABLET | Freq: Every day | ORAL | Status: DC
  Administered 2020-11-23: 81 mg via ORAL
  Filled 2020-11-23: qty 1

## 2020-11-23 MED ORDER — ATORVASTATIN CALCIUM 10 MG PO TABS
10.0000 mg | ORAL_TABLET | Freq: Every day | ORAL | Status: DC
  Administered 2020-11-23: 10 mg via ORAL
  Filled 2020-11-23: qty 1

## 2020-11-23 MED ORDER — ACETAMINOPHEN 325 MG PO TABS: 650.0000 mg | ORAL_TABLET | Freq: Four times a day (QID) | ORAL | Status: DC | PRN

## 2020-11-23 MED ORDER — ENOXAPARIN SODIUM 40 MG/0.4ML IJ SOSY
40.0000 mg | PREFILLED_SYRINGE | INTRAMUSCULAR | Status: DC
  Administered 2020-11-23: 40 mg via SUBCUTANEOUS
  Filled 2020-11-23: qty 0.4

## 2020-11-23 MED ORDER — ALBUTEROL SULFATE HFA 108 (90 BASE) MCG/ACT IN AERS
2.0000 | INHALATION_SPRAY | Freq: Four times a day (QID) | RESPIRATORY_TRACT | Status: DC | PRN
  Filled 2020-11-23: qty 6.7

## 2020-11-23 MED ORDER — GABAPENTIN 300 MG PO CAPS
900.0000 mg | ORAL_CAPSULE | Freq: Three times a day (TID) | ORAL | Status: DC
  Administered 2020-11-23 (×2): 900 mg via ORAL
  Filled 2020-11-23 (×2): qty 3

## 2020-11-23 MED ORDER — ACETAMINOPHEN 650 MG RE SUPP: 650.0000 mg | Freq: Four times a day (QID) | RECTAL | Status: DC | PRN

## 2020-11-23 MED ORDER — NICOTINE 21 MG/24HR TD PT24
21.0000 mg | MEDICATED_PATCH | Freq: Every day | TRANSDERMAL | Status: DC
  Filled 2020-11-23: qty 1

## 2020-11-23 MED ORDER — SODIUM CHLORIDE 0.9 % IV SOLN
75.0000 mL/h | INTRAVENOUS | Status: AC
  Administered 2020-11-23: 75 mL/h via INTRAVENOUS

## 2020-11-23 MED ORDER — AMITRIPTYLINE HCL 25 MG PO TABS: 25.0000 mg | ORAL_TABLET | Freq: Every day | ORAL | Status: DC

## 2020-11-23 MED ORDER — HYDROCODONE-ACETAMINOPHEN 5-325 MG PO TABS: 1.0000 | ORAL_TABLET | ORAL | Status: DC | PRN

## 2020-11-23 NOTE — Evaluation (Signed)
Occupational Therapy Evaluation Patient Details Name: Patrick Romero MRN: [id number] DOB: 06/14/1961 Today's Date: 11/23/2020    History of Present Illness Patrick Romero is a 60 y.o. male admitted was at his friends house had 7 beers and smoked a blunt and had a syncopal episode. PHMx:  HTN, HL, tob dep, renal carcinoma status post partial nephrectomy 12/2016, prostate CA status post prostatectomy, central cord syndrome (08/2016 cervical injury from fall) and incomplete quadriparesis and spastic rigth hemiparesis,anemia, ETOH,CVA, COPD, COVID 2021.   Clinical Impression   Pt presents with decline in function and safety with ADLs and ADL mobility with impaired balance and endurance; hx of CVA with R side weakness and impaired impaired shoulder AROM. PTA pt lived at home with his mother and sister. Pt was Ind with ADLs, mobility using SPC, does not drive. Pt currently requires min guard A with LB selfcare, grooming standing at sink and mobility using RW. Pt with no LOB or unsteadiness observed. Pt would benefit from acute OT services to address impairments to maximize level of function and safety    Follow Up Recommendations  No OT follow up    Equipment Recommendations  None recommended by OT    Recommendations for Other Services       Precautions / Restrictions Precautions Precautions: Fall Restrictions Weight Bearing Restrictions: No      Mobility Bed Mobility Overal bed mobility: Needs Assistance Bed Mobility: Supine to Sit;Sit to Supine     Supine to sit: Supervision Sit to supine: Supervision        Transfers Overall transfer level: Needs assistance Equipment used: Rolling walker (2 wheeled) Transfers: Sit to/from Stand Sit to Stand: Min guard              Balance Overall balance assessment: Needs assistance Sitting-balance support: No upper extremity supported;Feet supported Sitting balance-Leahy Scale: Good     Standing balance support: Bilateral upper  extremity supported;Single extremity supported;During functional activity Standing balance-Leahy Scale: Fair                             ADL either performed or assessed with clinical judgement   ADL Overall ADL's : Needs assistance/impaired Eating/Feeding: Set up;Independent;Sitting   Grooming: Wash/dry hands;Wash/dry face;Min guard;Standing   Upper Body Bathing: Set up;Supervision/ safety;Sitting   Lower Body Bathing: Min guard   Upper Body Dressing : Set up;Supervision/safety   Lower Body Dressing: Min guard   Toilet Transfer: Min guard;Ambulation;RW;Regular Museum/gallery exhibitions officer and Hygiene: Min guard;Sit to/from stand       Functional mobility during ADLs: Min guard;Rolling walker       Vision Patient Visual Report: No change from baseline       Perception     Praxis      Pertinent Vitals/Pain Pain Assessment: No/denies pain     Hand Dominance Right   Extremity/Trunk Assessment Upper Extremity Assessment Upper Extremity Assessment: Generalized weakness;RUE deficits/detail RUE Deficits / Details: shoulder flexion ad ABD impaired 0-90 degrees, does not hinder ADLs RUE Coordination: decreased fine motor;decreased gross motor   Lower Extremity Assessment Lower Extremity Assessment: Defer to PT evaluation   Cervical / Trunk Assessment Cervical / Trunk Assessment: Normal   Communication Communication Communication: No difficulties   Cognition Arousal/Alertness: Awake/alert Behavior During Therapy: WFL for tasks assessed/performed Overall Cognitive Status: Within Functional Limits for tasks assessed  General Comments       Exercises     Shoulder Instructions      Home Living Family/patient expects to be discharged to:: Private residence Living Arrangements: Other relatives Available Help at Discharge: Family;Available 24 hours/day Type of Home: House Home Access:  Stairs to enter [employer] of Steps: 3-4 Entrance Stairs-Rails: Right;Left Home Layout: One level     Bathroom Shower/Tub: Tub/shower unit;Walk-in shower   Bathroom Toilet: Standard     Home Equipment: Environmental consultant - 2 wheels;Cane - single point;Tub bench          Prior Functioning/Environment Level of Independence: Independent with assistive device(s);Needs assistance  Gait / Transfers Assistance Needed: SPC for mobility ADL's / Homemaking Assistance Needed: Independent with ADLs, IADLs            OT Problem List: Decreased strength;Impaired balance (sitting and/or standing);Impaired tone;Decreased range of motion;Decreased activity tolerance;Decreased coordination;Impaired UE functional use      OT Treatment/Interventions: Self-care/ADL training;Therapeutic exercise;Patient/family education;Balance training;Therapeutic activities;DME and/or AE instruction    OT Goals(Current goals can be found in the care plan section) Acute Rehab OT Goals Patient Stated Goal: go home OT Goal Formulation: With patient Time For Goal Achievement: 12/07/20 Potential to Achieve Goals: Good ADL Goals Pt Will Perform Grooming: with supervision;with set-up;standing Pt Will Perform Upper Body Bathing: with modified independence Pt Will Perform Lower Body Bathing: with supervision;with set-up;with modified independence Pt Will Perform Upper Body Dressing: with modified independence Pt Will Perform Lower Body Dressing: with supervision;with set-up;with modified independence Pt Will Transfer to Toilet: with supervision;with modified independence;ambulating Pt Will Perform Toileting - Clothing Manipulation and hygiene: with supervision;with modified independence;sit to/from stand Pt Will Perform Tub/Shower Transfer: with min guard assist;with supervision;ambulating  OT Frequency: Min 2X/week   Barriers to D/C:            Co-evaluation              AM-PAC OT "6 Clicks" Daily  Activity     Outcome Measure Help from another person eating meals?: None Help from another person taking care of personal grooming?: A Little Help from another person toileting, which includes using toliet, bedpan, or urinal?: A Little Help from another person bathing (including washing, rinsing, drying)?: A Little Help from another person to put on and taking off regular upper body clothing?: None Help from another person to put on and taking off regular lower body clothing?: A Little 6 Click Score: 20   End of Session Equipment Utilized During Treatment: Gait belt;Rolling walker Nurse Communication: Mobility status  Activity Tolerance: Patient tolerated treatment well Patient left: in bed;with call bell/phone within reach;with bed alarm set  OT Visit Diagnosis: Unsteadiness on feet (R26.81);Other abnormalities of gait and mobility (R26.89);Muscle weakness (generalized) (M62.81);Hemiplegia and hemiparesis Hemiplegia - Right/Left: Right Hemiplegia - dominant/non-dominant: Dominant Hemiplegia - caused by: Cerebral infarction                Time: 1057-1120 OT Time Calculation (min): 23 min Charges:  OT General Charges $OT Visit: 1 Visit OT Evaluation $OT Eval Moderate Complexity: 1 Mod OT Treatments $Self Care/Home Management : 8-22 mins    Britt Bottom 11/23/2020, 1:25 PM

## 2020-11-23 NOTE — Procedures (Addendum)
Patient Name: HUBER MATHERS  MRN: [id number]  Epilepsy Attending: Lora Havens  Referring Physician/Provider: Dr Marzetta Board Date: 11/23/2020 Duration: 25.19 mins  Patient history: 60 year old male with syncope.  EEG to evaluate for seizures.  Level of alertness: Awake  AEDs during EEG study: Gabapentin  Technical aspects: This EEG study was done with scalp electrodes positioned according to the 10-20 International system of electrode placement. Electrical activity was acquired at a sampling rate of 500Hz  and reviewed with a high frequency filter of 70Hz  and a low frequency filter of 1Hz . EEG data were recorded continuously and digitally stored.   Description: The posterior dominant rhythm consists of 9-10 Hz activity of moderate voltage (25-35 uV) seen predominantly in posterior head regions, symmetric and reactive to eye opening and eye closing. Hyperventilation and photic stimulation were not performed.     IMPRESSION: This study is within normal limits. No seizures or epileptiform discharges were seen throughout the recording.  Teletha Petrea Barbra Sarks

## 2020-11-23 NOTE — Progress Notes (Signed)
RN called and gave patient discharge instructions and the patient stated understanding. IV has been removed, pt has called his sister for pick up she also is going to bring his clothes.

## 2020-11-23 NOTE — Progress Notes (Signed)
Carotid artery duplex completed. Refer to "CV Proc" under chart review to view preliminary results.  11/23/2020 2:54 PM Kelby Aline., MHA, RVT, RDCS, RDMS

## 2020-11-23 NOTE — Progress Notes (Signed)
PT Cancellation Note  Patient Details Name: NOAL ABSHIER MRN: [id number] DOB: Sep 10, 1960   Cancelled Treatment:    Reason Eval/Treat Not Completed: Patient at procedure or test/unavailable   Attemtped PT eval earlier, pt not in room;   Will follow up later today as time allows;  Otherwise, will follow up for PT tomorrow;   Thank you,  Roney Marion, PT  Acute Rehabilitation Services Pager [phone] Office [place] 11/23/2020, 3:34 PM

## 2020-11-23 NOTE — Progress Notes (Signed)
EEG complete - results pending 

## 2020-11-23 NOTE — Evaluation (Signed)
Physical Therapy Evaluation Patient Details Name: Patrick Romero MRN: [id number] DOB: April 26, 1961 Today's Date: 11/23/2020   History of Present Illness  Patrick Romero is a 60 y.o. male admitted was at his friends house had 7 beers and smoked a blunt and had a syncopal episode. PHMx:  HTN, HL, tob dep, renal carcinoma status post partial nephrectomy 12/2016, prostate CA status post prostatectomy, central cord syndrome (08/2016 cervical injury from fall) and incomplete quadriparesis and spastic rigth hemiparesis,anemia, ETOH,CVA, COPD, COVID 2021.  Clinical Impression   Pt admitted with above diagnosis. Lives at home with family in a single level home with a few steps to enter;  [facility] with a cane at baseline, does not drive; Presents to PT with stiff gait that has synergy patterns for RLE advancement, erratic step width, incr fall risk; Tells me he follow with a PCP for primary care (and he will ask about getting his BP under control), and Dr. Tessa Lerner for stroke and SCI follow up; Dr. Tessa Lerner recently referred him for PT for continuing work on gait and balance, and this PT heartily agrees; Pt currently with functional limitations due to the deficits listed below (see PT Problem List). Pt will benefit from skilled PT to increase their independence and safety with mobility to allow discharge to the venue listed below.       Follow Up Recommendations Outpatient PT (for gait and balance dysfunction)    Equipment Recommendations  None recommended by PT    Recommendations for Other Services       Precautions / Restrictions Precautions Precautions: Fall Restrictions Weight Bearing Restrictions: No      Mobility  Bed Mobility Overal bed mobility: Needs Assistance Bed Mobility: Supine to Sit;Sit to Supine     Supine to sit: Supervision Sit to supine: Supervision        Transfers Overall transfer level: Needs assistance Equipment used: Straight cane Transfers: Sit to/from Stand Sit  to Stand: Min guard         General transfer comment: slow rise  Ambulation/Gait Ambulation/Gait assistance: Min guard (without physical contact) Gait Distance (Feet): 180 Feet Assistive device: Straight cane Gait Pattern/deviations: Decreased step length - right;Ataxic     General Gait Details: Does not fully extend R hip and knee in stance, leading to inefficient swing LLE; Noting synergy pattern for RLE advancement  Stairs            Wheelchair Mobility    Modified Rankin (Stroke Patients Only)       Balance Overall balance assessment: Needs assistance Sitting-balance support: No upper extremity supported;Feet supported Sitting balance-Leahy Scale: Good     Standing balance support: Bilateral upper extremity supported;Single extremity supported;During functional activity Standing balance-Leahy Scale: Fair                               Pertinent Vitals/Pain Pain Assessment: No/denies pain    Home Living Family/patient expects to be discharged to:: Private residence Living Arrangements: Other relatives Available Help at Discharge: Family;Available 24 hours/day Type of Home: House Home Access: Stairs to enter Entrance Stairs-Rails: Psychiatric nurse of Steps: 3-4 Home Layout: One level Home Equipment: Walker - 2 wheels;Cane - single point;Tub bench      Prior Function Level of Independence: Independent with assistive device(s);Needs assistance   Gait / Transfers Assistance Needed: SPC for mobility  ADL's / Homemaking Assistance Needed: Independent with ADLs, IADLs  Hand Dominance   Dominant Hand: Right    Extremity/Trunk Assessment   Upper Extremity Assessment Upper Extremity Assessment: Defer to OT evaluation RUE Deficits / Details: shoulder flexion ad ABD impaired 0-90 degrees, does not hinder ADLs RUE Coordination: decreased fine motor;decreased gross motor    Lower Extremity Assessment Lower Extremity  Assessment: Generalized weakness;RLE deficits/detail RLE Deficits / Details: General stiffness and decr coordination RLE Coordination: decreased fine motor;decreased gross motor    Cervical / Trunk Assessment Cervical / Trunk Assessment: Normal  Communication   Communication: No difficulties  Cognition Arousal/Alertness: Awake/alert Behavior During Therapy: WFL for tasks assessed/performed Overall Cognitive Status: Within Functional Limits for tasks assessed                                        General Comments General comments (skin integrity, edema, etc.): Overall no reports of dizziness during mobility this session; opbtained Orthostatic BPs and they did not demonstrate a drop in upright positions; they were quite high, though, and we discussed teh need to get BP under control to decr risk of CVA and MI    Exercises     Assessment/Plan    PT Assessment Patient needs continued PT services  PT Problem List Decreased strength;Decreased range of motion;Decreased activity tolerance;Decreased balance;Decreased mobility;Decreased coordination;Decreased knowledge of use of DME;Decreased knowledge of precautions;Impaired tone       PT Treatment Interventions DME instruction;Gait training;Stair training;Functional mobility training;Therapeutic activities;Therapeutic exercise;Balance training;Neuromuscular re-education;Patient/family education    PT Goals (Current goals can be found in the Care Plan section)  Acute Rehab PT Goals Patient Stated Goal: go home PT Goal Formulation: With patient Time For Goal Achievement: 12/07/20 Potential to Achieve Goals: Good    Frequency Min 3X/week   Barriers to discharge        Co-evaluation               AM-PAC PT "6 Clicks" Mobility  Outcome Measure Help needed turning from your back to your side while in a flat bed without using bedrails?: None Help needed moving from lying on your back to sitting on the side of a  flat bed without using bedrails?: None Help needed moving to and from a bed to a chair (including a wheelchair)?: None Help needed standing up from a chair using your arms (e.g., wheelchair or bedside chair)?: None Help needed to walk in hospital room?: None Help needed climbing 3-5 steps with a railing? : A Little 6 Click Score: 23    End of Session Equipment Utilized During Treatment: Gait belt Activity Tolerance: Patient tolerated treatment well Patient left: in bed;with call bell/phone within reach;with bed alarm set Nurse Communication: Mobility status PT Visit Diagnosis: Unsteadiness on feet (R26.81);Other abnormalities of gait and mobility (R26.89);Muscle weakness (generalized) (M62.81);Hemiplegia and hemiparesis Hemiplegia - Right/Left: Left Hemiplegia - dominant/non-dominant: Non-dominant Hemiplegia - caused by: Cerebral infarction (also history of Central Cord SCI)    Time: 6301-6010 PT Time Calculation (min) (ACUTE ONLY): 25 min   Charges:   PT Evaluation $PT Eval Low Complexity: 1 Low PT Treatments $Gait Training: 8-22 mins        Roney Marion, PT  Acute Rehabilitation Services Pager [phone] Office [place] 11/23/2020, 4:25 PM

## 2020-11-23 NOTE — Discharge Summary (Addendum)
Physician Discharge Summary  Patrick Romero JSE:[id number] DOB: 06-Nov-1960 DOA: 11/22/2020  PCP: Ladell Pier, MD  Admit date: 11/22/2020 Discharge date: 11/23/2020  Admitted From: home Disposition:  home  Recommendations for Outpatient Follow-up:  1. Follow up with PCP in 1-2 weeks  Home Health: none Equipment/Devices: none  Discharge Condition: stable CODE STATUS: Full code Diet recommendation: regular  HPI: Per admitting MD, Patrick Romero is a 60 y.o. 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), anemia, ETOH.CVA, COPD He was sitting felt mouth got dry, sweaty, chest tightness start seeing spots in front Of his eyes He has had this episodes up to 4 times now they ocure at rest Prior to having CVA he was very active He is now walking with a cain  no chest pain when he walks He does use oxygen 2.5L as needed For COPD he had [place] and have had problems with hypoxia since Reports no prior work up for this Patient states occasionally he feels palpitations Was at his friends house had 7 beers and smoked a blunt, did not hit his head Had 2 syncopal events from sitting position EMS was called Was noted to by hypotensive He continues to drink beer or liquor 3-4 beers socially No withdral history Continues to smoke 1/2 pack a day He has been eating and drinking well States he have not had a bowel movement for the past 3 days  Hospital Course / Discharge diagnoses: Principal probem Syncope -patient was admitted to the hospital with a syncopal episode in the setting of EtOH use.  He did report chest pain prior to passing out.  His cardiac work-up was fairly unremarkable, underwent a 2D echo which showed an EF of 70-75% without WMA.  Troponins were  negative.  Cardiology consulted and evaluated patient as well, did not recommend further work-up at this point.  Patient did report that his friends told him that he shook his hands, having urinary incontinence and apparently was confused afterwards.  An EEG was negative for epileptic discharges.  An MRI of the brain showed an incidental meningioma.  Patient was strongly counseled for complete EtOH cessation as this, combined with dehydration is likely the cause for his passing out.  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 D/w the patient in detail.  Sepsis ruled out   Discharge Instructions   Allergies as of 11/23/2020   No Known Allergies     Medication List    TAKE these medications   albuterol 108 (90 Base) MCG/ACT inhaler Commonly known as: VENTOLIN HFA Inhale 2 puffs into the lungs every 6 (six) hours as needed for wheezing or shortness of breath.   amitriptyline 25 MG tablet Commonly known as: ELAVIL Take 1 tablet (25 mg total) by mouth at bedtime.   amLODipine 5 MG tablet Commonly known as: NORVASC Take 1 tablet (5 mg total) by mouth daily.   aspirin EC 81 MG tablet Take 1 tablet (81 mg total) by mouth daily.   atorvastatin 10 MG tablet Commonly known as: LIPITOR Take 1 tablet (10 mg total) by mouth daily.   baclofen 20 MG  tablet Commonly known as: LIORESAL TAKE 1 TABLET BY MOUTH FOUR TIMES A DAY   ferrous sulfate 325 (65 FE) MG tablet Take 1 tablet every Monday, Wednesday, and Friday.   gabapentin 300 MG capsule Commonly known as: NEURONTIN TAKE 3 CAPSULES BY MOUTH 3 TIMES A DAY What changed:   how much to take  how to take this  when to take  this  additional instructions   Narcan 4 MG/0.1ML Liqd nasal spray kit Generic drug: naloxone Place 1 spray into the nose as needed (overdose). As needed   oxyCODONE-acetaminophen 10-325 MG tablet Commonly known as: PERCOCET Take 1 tablet by mouth 3 (three) times daily.   prednisoLONE acetate 1 % ophthalmic suspension Commonly known as: PRED FORTE Place 1 drop into the left eye in the morning, at noon, in the evening, and at bedtime.   tiZANidine 2 MG tablet Commonly known as: ZANAFLEX TAKE 1-2 TABLETS (2-4 MG TOTAL) BY MOUTH 4 (FOUR) TIMES DAILY. (TAKE 2 AT BEDTIME)   Vitamin D (Ergocalciferol) 1.25 MG (50000 UNIT) Caps capsule Commonly known as: DRISDOL Take 50,000 Units by mouth once a week.       Follow-up Information    Dawley, Troy C, DO. Schedule an appointment as soon as possible for a visit in 4 week(s).   Contact information: [address] [place] [address] [phone]        Ladell Pier, MD. Schedule an appointment as soon as possible for a visit in 2 week(s).   Specialty: Internal Medicine Contact information: [address] [place] [address] [phone]        Jettie Booze, MD .   Specialties: Cardiology, Radiology, Interventional Cardiology Contact information: [address] [place] [address] [phone]               Consultations:  Cardiology   Procedures/Studies:  DG Chest 2 View  Result Date: 11/22/2020 CLINICAL DATA:  Chest pain EXAM: CHEST - 2 VIEW COMPARISON:  March 23, 2020 FINDINGS: The heart size and mediastinal contours are within normal limits. Pulmonary hyperinflation with chronic coarse interstitial thickening. No focal consolidation. No pleural effusion or pneumothorax. The visualized skeletal structures are unremarkable. IMPRESSION: Chronic lung changes suggestive of COPD without acute cardiopulmonary process. Electronically Signed   By: Dahlia Bailiff  MD   On: 11/22/2020 22:25   DG Abd 1 View  Result Date: 11/23/2020 CLINICAL DATA:  Abdominal distension, syncope and hypotension EXAM: ABDOMEN - 1 VIEW COMPARISON:  CT February 15, 2019. FINDINGS: The bowel gas pattern is normal. No radio-opaque calculi or other significant radiographic abnormality are seen. IMPRESSION: Negative. Electronically Signed   By: Dahlia Bailiff MD   On: 11/23/2020 03:06   MR BRAIN WO CONTRAST  Result Date: 11/23/2020 CLINICAL DATA:  Seizure, abnormal neuro exam. EXAM: MRI HEAD WITHOUT CONTRAST TECHNIQUE: Multiplanar, multiecho pulse sequences of the brain and surrounding structures were obtained without intravenous contrast. COMPARISON:  CT head August 31, 2016. FINDINGS: Brain: No acute infarction, hemorrhage, hydrocephalus, or extra-axial fluid collection. When comparing across modalities, similar size of an approximately 6 mm mass along the right tentorial leaflet, most likely a meningioma. In surrounding edema. The hippocampi are within normal limits and symmetric in size/signal. Vascular: Major arterial flow voids are maintained at the skull base. Skull and upper cervical spine: Normal marrow signal. Sinuses/Orbits: Mild scattered mucosal thickening. Left lens extraction. Otherwise, unremarkable orbits. Other: Nonspecific prominent cervical chain lymph nodes, partially imaged. 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 IMPRESSION: 1. No evidence of acute intracranial abnormality. 2. When comparing across modalities, similar size of an approximately 6 mm putative meningioma along the right tentorial leaflet. No surrounding edema. Electronically Signed   By: Margaretha Sheffield MD   On: 11/23/2020 14:51   US Abdomen Complete  Result Date: 11/23/2020 CLINICAL DATA:  Initial evaluation for hepatomegaly, history of renal carcinoma. History of prior partial right nephrectomy. EXAM: ABDOMEN ULTRASOUND COMPLETE COMPARISON:  Prior  radiograph from earlier the same day. FINDINGS: Gallbladder: No gallstones or wall thickening visualized. No sonographic Murphy sign noted by sonographer. Common bile duct: Diameter: 5 mm Liver: Mildly heterogeneous echotexture within the liver without discrete mass or other abnormality. Portal vein is patent on color Doppler imaging with normal direction of blood flow towards the liver. IVC: No abnormality visualized. Pancreas: Visualized portion unremarkable. Spleen: Size and appearance within normal limits. Right Kidney: Length: 8 cm. Renal echogenicity within normal limits. No visible nephrolithiasis or hydronephrosis. No visible focal renal mass. Left Kidney: Length: 9 cm. Renal echogenicity within normal limits. No visible nephrolithiasis or hydronephrosis. No focal renal mass. Abdominal aorta: No aneurysm visualized. Other findings: None. IMPRESSION: Negative abdominal ultrasound with, with no acute abnormality identified. Electronically Signed   By: Jeannine Boga M.D.   On: 11/23/2020 01:57   EEG adult  Result Date: 11/23/2020 Lora Havens, MD     11/23/2020 10:19 AM Patient Name: NAI DASCH MRN: [id number] Epilepsy Attending: Lora Havens Referring Physician/Provider: Dr Marzetta Board Date: 11/23/2020 Duration: 25.19 mins Patient history: 60 year old male with syncope.  EEG to evaluate for seizures. Level of alertness: Awake AEDs during EEG study: Gabapentin Technical aspects: This EEG study was done with scalp electrodes positioned according to the 10-20 International system of electrode placement. Electrical activity was acquired at a sampling rate of _0  and reviewed with a high frequency filter of _1  and a low frequency filter of _2 . EEG data were recorded continuously and digitally stored. Description: The posterior dominant rhythm consists of 9-10 Hz activity of moderate voltage (25-35 uV) seen predominantly in posterior head regions, symmetric and reactive to eye opening  and eye closing. Hyperventilation and photic stimulation were not performed.   IMPRESSION: This study is within normal limits. No seizures or epileptiform discharges were seen throughout the recording. Lora Havens   ECHOCARDIOGRAM COMPLETE  Result Date: 11/23/2020    ECHOCARDIOGRAM REPORT   Patient Name:   EZZARD DITMER Date of Exam: 11/23/2020 Medical Rec #:  [id number]      Height:       72.0 in Accession #:    [id number]     Weight:       152.8 lb Date of Birth:  16-Jun-1961     BSA:          1.900 m Patient Age:    40 years       BP:           162/100 mmHg Patient Gender: M              HR:           83 bpm. Exam Location:  Inpatient Procedure: 2D Echo, 3D Echo, Cardiac Doppler, Color Doppler and Strain Analysis Indications:    Syncope R55  History:        Patient has no prior history of Echocardiogram examinations.  COPD and Stroke; Risk Factors:Hypertension and Dyslipidemia.                 ETOH.  Sonographer:    Darlina Sicilian RDCS Referring Phys: [place]  1. Left ventricular ejection fraction, by estimation, is 70 to 75%. The left ventricle has hyperdynamic function. The left ventricle has no regional wall motion abnormalities. Left ventricular diastolic parameters were normal.  2. Right ventricular systolic function is normal. The right ventricular size is normal.  3. The mitral valve is normal in structure. Trivial mitral valve regurgitation.  4. The aortic valve is normal in structure. Aortic valve regurgitation is not visualized. FINDINGS  Left Ventricle: Left ventricular ejection fraction, by estimation, is 70 to 75%. The left ventricle has hyperdynamic function. The left ventricle has no regional wall motion abnormalities. The left ventricular internal cavity size was normal in size. There is no left ventricular hypertrophy. Left ventricular diastolic parameters were normal. Right Ventricle: The right ventricular size is normal. Right vetricular wall  thickness was not assessed. Right ventricular systolic function is normal. Left Atrium: Left atrial size was normal in size. Right Atrium: Right atrial size was normal in size. Pericardium: There is no evidence of pericardial effusion. Mitral Valve: The mitral valve is normal in structure. Trivial mitral valve regurgitation. Tricuspid Valve: The tricuspid valve is normal in structure. Tricuspid valve regurgitation is trivial. Aortic Valve: The aortic valve is normal in structure. Aortic valve regurgitation is not visualized. Pulmonic Valve: The pulmonic valve was normal in structure. Pulmonic valve regurgitation is not visualized. Aorta: The aortic root and ascending aorta are structurally normal, with no evidence of dilitation. IAS/Shunts: No atrial level shunt detected by color flow Doppler.  LEFT VENTRICLE PLAX 2D LVIDd:         4.40 cm  Diastology LVIDs:         2.20 cm  LV e' medial:    9.79 cm/s LV PW:         1.10 cm  LV E/e' medial:  11.1 LV IVS:        1.00 cm  LV e' lateral:   10.80 cm/s LVOT diam:     2.10 cm  LV E/e' lateral: 10.1 LV SV:         72 LV SV Index:   38 LVOT Area:     3.46 cm  RIGHT VENTRICLE RV S prime:     14.80 cm/s TAPSE (M-mode): 2.5 cm LEFT ATRIUM             Index       RIGHT ATRIUM           Index LA diam:        3.40 cm 1.79 cm/m  RA Area:     11.10 cm LA Vol (A2C):   50.5 ml 26.58 ml/m RA Volume:   21.60 ml  11.37 ml/m LA Vol (A4C):   41.8 ml 22.00 ml/m LA Biplane Vol: 47.7 ml 25.10 ml/m  AORTIC VALVE LVOT Vmax:   105.00 cm/s LVOT Vmean:  72.300 cm/s LVOT VTI:    0.208 m  AORTA Ao Root diam: 3.10 cm Ao Asc diam:  3.00 cm MITRAL VALVE MV Area (PHT): 4.60 cm     SHUNTS MV Decel Time: 165 msec     Systemic VTI:  0.21 m MV E velocity: 109.00 cm/s  Systemic Diam: 2.10 cm MV A velocity: 79.30 cm/s MV E/A ratio:  1.37 Dorris Carnes MD Electronically signed by Nevin Bloodgood  Harrington Challenger MD Signature Date/Time: 11/23/2020/10:56:46 AM    Final    VAS US CAROTID  Result Date: 11/23/2020 Carotid  Arterial Duplex Study Patient Name:  LORIE MELICHAR  Date of Exam:   11/23/2020 Medical Rec #: [id number]       Accession #:    [id number] Date of Birth: [birth year]      Patient Gender: M Patient Age:   59Y Exam Location:  [facility] Procedure:      VAS US CAROTID Referring Phys: [id number] ANASTASSIA DOUTOVA --------------------------------------------------------------------------------  Indications:       Syncope. Risk Factors:      Hypertension. Comparison Study:  No prior study Performing Technologist: Maudry Mayhew MHA, RDMS, RVT, RDCS  Examination Guidelines: A complete evaluation includes B-mode imaging, spectral Doppler, color Doppler, and power Doppler as needed of all accessible portions of each vessel. Bilateral testing is considered an integral part of a complete examination. Limited examinations for reoccurring indications may be performed as noted.  Right Carotid Findings: +----------+--------+-------+--------+----------------------+------------------+           PSV cm/sEDV    StenosisPlaque Description    Comments                             cm/s                                                    +----------+--------+-------+--------+----------------------+------------------+ CCA Prox  95      16                                   intimal thickening +----------+--------+-------+--------+----------------------+------------------+ CCA Distal75      17                                   intimal thickening +----------+--------+-------+--------+----------------------+------------------+ ICA Prox  70      19             smooth and                                                                heterogenous                             +----------+--------+-------+--------+----------------------+------------------+ ICA Distal68      25                                                       +----------+--------+-------+--------+----------------------+------------------+ ECA       80      13             smooth and  heterogenous                             +----------+--------+-------+--------+----------------------+------------------+ +----------+--------+-------+----------------+-------------------+           PSV cm/sEDV cmsDescribe        Arm Pressure (mmHG) +----------+--------+-------+----------------+-------------------+ [id number]             Multiphasic, WNL                    +----------+--------+-------+----------------+-------------------+ +---------+--------+--+--------+--+---------+ VertebralPSV cm/s47EDV cm/s15Antegrade +---------+--------+--+--------+--+---------+  Left Carotid Findings: +----------+--------+-------+--------+--------------------------------+--------+           PSV cm/sEDV    StenosisPlaque Description              Comments                   cm/s                                                    +----------+--------+-------+--------+--------------------------------+--------+ CCA Prox  131     29                                                      +----------+--------+-------+--------+--------------------------------+--------+ CCA Distal90      27                                                      +----------+--------+-------+--------+--------------------------------+--------+ ICA Prox  90      21             focal, irregular and                                                      heterogenous                             +----------+--------+-------+--------+--------------------------------+--------+ ICA Distal87      30                                                      +----------+--------+-------+--------+--------------------------------+--------+ ECA       104     24                                                       +----------+--------+-------+--------+--------------------------------+--------+ +----------+--------+--------+----------------+-------------------+           PSV cm/sEDV cm/sDescribe        Arm Pressure (mmHG) +----------+--------+--------+----------------+-------------------+ Donella Stade  Multiphasic, WNL                    +----------+--------+--------+----------------+-------------------+ +---------+--------+--+--------+--+---------+ VertebralPSV cm/s40EDV cm/s14Antegrade +---------+--------+--+--------+--+---------+      Preliminary      Subjective: - no chest pain, shortness of breath, no abdominal pain, nausea or vomiting.   Discharge Exam: BP (!) 160/93 (BP Location: Left Arm)   Pulse 87   Temp 98.6 F (37 C) (Oral)   Resp 16   Ht 6' (1.829 m)   Wt 69.3 kg   SpO2 98%   BMI 20.72 kg/m   General: Pt is alert, awake, not in acute distress Cardiovascular: RRR, S1/S2 +, no rubs, no gallops Respiratory: CTA bilaterally, no wheezing, no rhonchi Abdominal: Soft, NT, ND, bowel sounds + Extremities: no edema, no cyanosis   The results of significant diagnostics from this hospitalization (including imaging, microbiology, ancillary and laboratory) are listed below for reference.     Microbiology: Recent Results (from the past 240 hour(s))  Resp Panel by RT-PCR (Flu A&B, Covid) Nasopharyngeal Swab     Status: None   Collection Time: 11/22/20  7:56 PM   Specimen: Nasopharyngeal Swab; Nasopharyngeal(NP) swabs in vial transport medium  Result Value Ref Range Status   SARS Coronavirus 2 by RT PCR NEGATIVE NEGATIVE Final    Comment: (NOTE) SARS-CoV-2 target nucleic acids are NOT DETECTED.  The SARS-CoV-2 RNA is generally detectable in upper respiratory specimens during the acute phase of infection. The lowest concentration of SARS-CoV-2 viral copies this assay can detect is 138 copies/mL. A negative result does not preclude SARS-Cov-2 infection  and should not be used as the sole basis for treatment or other patient management decisions. A negative result may occur with  improper specimen collection/handling, submission of specimen other than nasopharyngeal swab, presence of viral mutation(s) within the areas targeted by this assay, and inadequate number of viral copies(<138 copies/mL). A negative result must be combined with clinical observations, patient history, and epidemiological information. The expected result is Negative.  Fact Sheet for Patients:  EntrepreneurPulse.com.au  Fact Sheet for Healthcare Providers:  IncredibleEmployment.be  This test is no t yet approved or cleared by the [place] FDA and  has been authorized for detection and/or diagnosis of SARS-CoV-2 by FDA under an Emergency Use Authorization (EUA). This EUA will remain  in effect (meaning this test can be used) for the duration of the COVID-19 declaration under Section 564(b)(1) of the Act, 21 U.S.C.section 360bbb-3(b)(1), unless the authorization is terminated  or revoked sooner.       Influenza A by PCR NEGATIVE NEGATIVE Final   Influenza B by PCR NEGATIVE NEGATIVE Final    Comment: (NOTE) The Xpert Xpress SARS-CoV-2/FLU/RSV plus assay is intended as an aid in the diagnosis of influenza from Nasopharyngeal swab specimens and should not be used as a sole basis for treatment. Nasal washings and aspirates are unacceptable for Xpert Xpress SARS-CoV-2/FLU/RSV testing.  Fact Sheet for Patients: EntrepreneurPulse.com.au  Fact Sheet for Healthcare Providers: IncredibleEmployment.be  This test is not yet approved or cleared by the [place] FDA and has been authorized for detection and/or diagnosis of SARS-CoV-2 by FDA under an Emergency Use Authorization (EUA). This EUA will remain in effect (meaning this test can be used) for the duration of the COVID-19 declaration  under Section 564(b)(1) of the Act, 21 U.S.C. section 360bbb-3(b)(1), unless the authorization is terminated or revoked.  Performed at [facility], [place] [address] [place], [place] [address]  Labs: Basic Metabolic Panel: Recent Labs  Lab 11/22/20 1956 11/23/20 0342  NA 135 136  K 3.6 4.1  CL 104 109  CO2 21* 19*  GLUCOSE 110* 78  BUN 14 11  CREATININE 1.42* 1.04  CALCIUM 8.8* 8.7*  MG  --  1.8  PHOS  --  3.9   Liver Function Tests: Recent Labs  Lab 11/23/20 0342  AST 16  15  ALT 14  14  ALKPHOS 49  49  BILITOT 0.6  0.4  PROT 6.5  6.5  ALBUMIN 3.4*  3.5   CBC: Recent Labs  Lab 11/22/20 1956 11/23/20 0342  WBC 5.0 7.2  NEUTROABS  --  3.4  HGB 10.0* 9.8*  HCT 32.1* 30.8*  MCV 81.5 80.2  PLT 264 280   CBG: Recent Labs  Lab 11/22/20 2007  GLUCAP 111*   Hgb A1c No results for input(s): HGBA1C in the last 72 hours. Lipid Profile No results for input(s): CHOL, HDL, LDLCALC, TRIG, CHOLHDL, LDLDIRECT in the last 72 hours. Thyroid function studies Recent Labs    11/23/20 0342  TSH 1.131   Urinalysis    Component Value Date/Time   COLORURINE STRAW (A) 11/22/2020 1940   APPEARANCEUR CLEAR 11/22/2020 1940   LABSPEC 1.005 11/22/2020 1940   PHURINE 6.0 11/22/2020 1940   GLUCOSEU NEGATIVE 11/22/2020 1940   HGBUR NEGATIVE 11/22/2020 1940   BILIRUBINUR NEGATIVE 11/22/2020 [place] 11/22/2020 1940   PROTEINUR NEGATIVE 11/22/2020 1940   NITRITE NEGATIVE 11/22/2020 1940   LEUKOCYTESUR NEGATIVE 11/22/2020 1940    FURTHER DISCHARGE INSTRUCTIONS:   Get Medicines reviewed and adjusted: Please take all your medications with you for your next visit with your Primary MD   Laboratory/radiological data: Please request your Primary MD to go over all hospital tests and procedure/radiological results at the follow up, please ask your Primary MD to get all Hospital records sent to his/her office.   In some cases, they will be  blood work, cultures and biopsy results pending at the time of your discharge. Please request that your primary care M.D. goes through all the records of your hospital data and follows up on these results.   Also Note the following: If you experience worsening of your admission symptoms, develop shortness of breath, life threatening emergency, suicidal or homicidal thoughts you must seek medical attention immediately by calling 911 or calling your MD immediately  if symptoms less severe.   You must read complete instructions/literature along with all the possible adverse reactions/side effects for all the Medicines you take and that have been prescribed to you. Take any new Medicines after you have completely understood and accpet all the possible adverse reactions/side effects.    Do not drive when taking Pain medications or sleeping medications (Benzodaizepines)   Do not take more than prescribed Pain, Sleep and Anxiety Medications. It is not advisable to combine anxiety,sleep and pain medications without talking with your primary care practitioner   Special Instructions: If you have smoked or chewed Tobacco  in the last 2 yrs please stop smoking, stop any regular Alcohol  and or any Recreational drug use.   Wear Seat belts while driving.   Please note: You were cared for by a hospitalist during your hospital stay. Once you are discharged, your primary care physician will handle any further medical issues. Please note that NO REFILLS for any discharge medications will be authorized once you are discharged, as it is imperative that you return to your primary  care physician (or establish a relationship with a primary care physician if you do not have one) for your post hospital discharge needs so that they can reassess your need for medications and monitor your lab values.  Time coordinating discharge: 35 minutes  SIGNED:  Marzetta Board, MD, PhD 11/23/2020, 5:15 PM
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
## 2020-11-23 NOTE — Progress Notes (Signed)
  Echocardiogram 2D Echocardiogram with strain and definity has been performed.  Patrick Romero M 11/23/2020, 8:02 AM

## 2020-11-24 ENCOUNTER — Telehealth: Payer: Self-pay

## 2020-11-24 NOTE — Telephone Encounter (Signed)
Transition Care Management Follow-up Telephone Call  Date of discharge and from where: 11/23/2020, [facility]  How have you been since you were released from the hospital? He said he feels great  Any questions or concerns? No  Items Reviewed:  Did the pt receive and understand the discharge instructions provided? Yes   Medications obtained and verified? Yes , no new medications ordered and he said that he has everything that he was taking prior to being hospitalized and is taking them as ordered. He did not want to review the med list  Other? No   Any new allergies since your discharge? No   Do you have support at home? Yes   Home Care and Equipment/Supplies: Were home health services ordered? no If so, what is the name of the agency? N/a Has the agency set up a time to come to the patient's home? not applicable Were any new equipment or medical supplies ordered?  No What is the name of the medical supply agency? n/a Were you able to get the supplies/equipment? not applicable Do you have any questions related to the use of the equipment or supplies? No   Has O2 from [place].  He stated that he does not need it all of the time. He uses it mostly when he overexerts himself   Functional Questionnaire: (I = Independent and D = Dependent) ADLs: independent. Currently ambulation with a cane. Has RW and tub bench.   Follow up appointments reviewed:   PCP Hospital f/u appt confirmed? He has an appointment with Dr Wynetta Emery 12/25/2020 and he did not want to schedule an appointment to be seen any sooner.    [facility] f/u appt confirmed? outpatient neuro PT and PT evals tomorrow - 11/25/2020. No other appointments are scheduled .  Are transportation arrangements needed? No   If their condition worsens, is the pt aware to call PCP or go to the Emergency Dept.? Yes  Was the patient provided with contact information for the PCP's office or ED? Yes  Was to pt encouraged  to call back with questions or concerns? Yes

## 2020-11-25 ENCOUNTER — Encounter: Payer: Self-pay | Admitting: Physical Therapy

## 2020-11-25 ENCOUNTER — Ambulatory Visit: Payer: Medicare Other | Admitting: Occupational Therapy

## 2020-11-25 ENCOUNTER — Other Ambulatory Visit: Payer: Self-pay

## 2020-11-25 ENCOUNTER — Ambulatory Visit: Payer: Medicare Other | Attending: Physical Medicine & Rehabilitation | Admitting: Physical Therapy

## 2020-11-25 VITALS — BP 128/74 | HR 101

## 2020-11-25 DIAGNOSIS — G8929 Other chronic pain: Secondary | ICD-10-CM | POA: Diagnosis present

## 2020-11-25 DIAGNOSIS — G8110 Spastic hemiplegia affecting unspecified side: Secondary | ICD-10-CM | POA: Insufficient documentation

## 2020-11-25 DIAGNOSIS — R2681 Unsteadiness on feet: Secondary | ICD-10-CM | POA: Diagnosis present

## 2020-11-25 DIAGNOSIS — M25611 Stiffness of right shoulder, not elsewhere classified: Secondary | ICD-10-CM | POA: Insufficient documentation

## 2020-11-25 DIAGNOSIS — R29818 Other symptoms and signs involving the nervous system: Secondary | ICD-10-CM | POA: Diagnosis present

## 2020-11-25 DIAGNOSIS — R278 Other lack of coordination: Secondary | ICD-10-CM

## 2020-11-25 DIAGNOSIS — M6281 Muscle weakness (generalized): Secondary | ICD-10-CM | POA: Diagnosis present

## 2020-11-25 DIAGNOSIS — R2689 Other abnormalities of gait and mobility: Secondary | ICD-10-CM

## 2020-11-25 DIAGNOSIS — M25511 Pain in right shoulder: Secondary | ICD-10-CM | POA: Insufficient documentation

## 2020-11-25 NOTE — Therapy (Signed)
[facility] [address] [place], [place], [address] Phone: [phone]   Fax:  [phone]  Occupational Therapy Evaluation  Patient Details  Name: Patrick Romero MRN: [id number] Date of Birth: 12-06-1960 Referring Provider (OT): Dr. Riley Kill   Encounter Date: 11/25/2020   OT End of Session - 11/25/20 1246    Visit Number 1    Number of Visits 17    Date for OT Re-Evaluation 01/25/21    Authorization Type MCR/MCD    Progress Note Due on Visit 10    OT Start Time 0930    OT Stop Time 1015    OT Time Calculation (min) 45 min    Activity Tolerance Patient tolerated treatment well    Behavior During Therapy [facility] for tasks assessed/performed           Past Medical History:  Diagnosis Date  . Arthritis    ra  . Cancer [facility])    prostate   . Cancer of kidney (HCC) 2018   part of right kidney removed  . Chronic back pain    lower back burns some too  . Chronic neck pain   . Depression   . Herniated disc, cervical   . Hypertension   . Sickle cell trait (HCC)   . Stroke [facility])    weakness in right hand and leg , numbness on left     Past Surgical History:  Procedure Laterality Date  . ROBOT ASSISTED LAPAROSCOPIC RADICAL PROSTATECTOMY N/A 09/13/2017   Procedure: XI ROBOTIC ASSISTED LAPAROSCOPIC RADICAL PROSTATECTOMY, BILATERAL PELVIC LYMPH NODE DISSECTION;  Surgeon: Sebastian Ache, MD;  Location: [facility];  Service: Urology;  Laterality: N/A;  . ROBOTIC ASSITED PARTIAL NEPHRECTOMY Right 01/04/2017   Procedure: XI ROBOTIC ASSITED PARTIAL NEPHRECTOMY;  Surgeon: Sebastian Ache, MD;  Location: [facility];  Service: Urology;  Laterality: Right;    There were no vitals filed for this visit.   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 PMH: Rt renal carcinoma s/p partial nephrectomy 2018, prostate CA, ETOH abuse, HTN     Patient Stated Goals Get more strength on my Rt side    Currently in Pain? Yes    Pain Score 6     Pain Location --   entire Rt side (sh to hip and back)   Pain Orientation Right    Pain Descriptors / Indicators Tingling;Sharp;Dull    Pain Type Chronic pain    Pain Onset More than a month ago    Pain Frequency Constant    Aggravating Factors  increased activity, rainy/cold weather    Pain Relieving Factors heat pad, ice             [facility] OT Assessment - 11/25/20 0935      Assessment   Medical Diagnosis Central Cord Syndrome    Referring Provider (OT) Dr. Riley Kill    Onset Date/Surgical Date 11/11/20   date of referral (original injury 2018)   Hand Dominance Right    Prior Therapy last had PT/OT at this clinic in 2020      Precautions   Precautions Fall    Precaution Comments no driving      Balance Screen   Has the patient fallen in the past 6 months Yes    How many times? 4      Home  Environment   Bathroom Shower/Tub Tub/Shower unit;Walk-in Shower  Pt uses both (shower seat, grab bars, and BSC)   Home Equipment Bedside commode;Grab bars - tub/shower;Shower seat;Cane - single point;Walker - 2 wheels    Additional Comments Pt lives w/ family in 1 story home with 3 steps to enter    Lives With [employer], sister, and son     Prior Function   Level of Independence Independent;Independent with household mobility with device;Independent with community mobility with device    Vocation On disability    Leisure playing games on the computer, playing cards with friends      ADL   Eating/Feeding Modified independent   Uses Rt dominant hand approx 80%   Grooming Modified independent   begins w/ Rt hand, finishes w/ Lt hand   Upper Body Bathing Modified independent    Lower Body Bathing Modified independent    Upper Body Dressing Increased time   difficulty w/ buttons, mostly wears pull over shirts   Lower Body Dressing Increased time    Toilet Transfer Modified  independent    Toileting - Clothing Manipulation Increased time    Toileting -  Hygiene Increase time    Tub/Shower Transfer Modified independent      IADL   Shopping Shops independently for small purchases   needs transportation   Light Housekeeping Performs light daily tasks such as dishwashing, bed making    Meal Prep Plans, prepares and serves adequate meals independently   ? safety getting things off stove - reports he sometimes gets help   [employer] on public transportation when accompanied by another;Relies on family or friends for transportation      Mobility   Mobility Status Comments walks w/ cane in community, does not use device inside home      Written Expression   Dominant Hand Right    Handwriting 90% legible   w/ regular pen (wrist flexed due to spasticity)     Vision - History   Baseline Vision Wears glasses only for reading    Additional Comments cataract sx Lt eye      Cognition   Overall Cognitive Status Within Functional Limits for tasks assessed      Sensation   Light Touch Appears Intact   UE's   Hot/Cold Impaired by gross assessment   noted burns on Lt hand at thumb and long finger tips   Additional Comments Does report tingling Bil hands, but can detect light touch, localization, and 2 pt discrimination      Coordination   9 Hole Peg Test Right;Left    Right 9 Hole Peg Test 34.79 sec    Left 9 Hole Peg Test 28.91 sec    Box and Blocks Rt = 38, Lt = 47      Edema   Edema none      Tone   Assessment Location Right Upper Extremity      ROM / Strength   AROM / PROM / Strength PROM      AROM   Overall AROM Comments LUE WFL's. RUE Sh flex to 100* w/ forearm pronation and wrist flex. ER approx 75%, IR approx 50%, elbow flex WFL's, elbow ext approx 90%, sup to neutral only, uses tenodesis for wrist/hand - pt can achieve wrist extension to approx 30* w/ fingers flexed but must go into wrist flexion to open fingers w/ more difficulty long  finger (small finger old fx limiting movement)      PROM   Overall PROM Comments RUE:  shoulder  to 135*, wrist extension WFL's w/ fingers flexed      Strength   Overall Strength Comments Did not assess due to hypertonicity RUE      RUE Tone   RUE Tone Hypertonic;Modified Ashworth      RUE Tone   Hypertonic Details especially w/ elbow and wrist extension    Modified Ashworth Scale for Grading Hypertonia RUE Slight increase in muscle tone, manifested by a catch, followed by minimal resistance throughout the remainder (less than half) of the ROM                             OT Short Term Goals - 11/25/20 1256      OT SHORT TERM GOAL #1   Title Pt will be independent with HEP for RUE (shoulder, elbow ext, supination, wrist ext)    Time 4    Period Weeks    Status New      OT SHORT TERM GOAL #2   Title Pt will verbalize understanding of safety considerations w/ sensory deficits during cooking tasks including use of A/E prn    Time 4    Period Weeks    Status New      OT SHORT TERM GOAL #3   Title Pt will feed self 100% with RUE    Baseline 80% per pt report    Time 4    Period Weeks    Status New      OT SHORT TERM GOAL #4   Title Pt to verbalize understanding with use of button hook and acquisition of AE prn    Time 4    Period Weeks    Status New             OT Long Term Goals - 11/25/20 1259      OT LONG TERM GOAL #1   Title Pt will perform functional reaching to midrange consistently with increased distal control    Time 8    Period Weeks    Status New      OT LONG TERM GOAL #2   Title Pt will tolerate 145* passive shoulder flexion w/ pain less than or equal to 5/10    Baseline 135* w/ pain fluctuating    Time 8    Period Weeks    Status New      OT LONG TERM GOAL #3   Title Pt will improve functional use of RUE as evidenced by performing 40 or more on Box & Blocks test    Baseline 38    Time 8    Period Weeks    Status New       OT LONG TERM GOAL #4   Title Pt will demo some in hand manipulation Rt hand to manipulate 1/2" objects    Time 8    Period Weeks    Status New                 Plan - 11/25/20 1247    Clinical Impression Statement Pt is a 60 y.o. male who presents to [place] with central cord syndrome from spinal injury in 2018 (from fall) resulting in Rt doimant side spastic hemiparesis and Rt shoulder adhesive capsulitis. PMH includes Rt renal carcinoma s/p partial nephrectomy 2018, prostate CA, ETOH abuse, HTN. Pt recently hospitalized due to syncope from ETOH abuse. Pt overall functioning very well and mostly modified independent with ADLS but would benefit from O.T. to increase  use of RUE, safety and independence with ADLS and overall spasticity management.    OT Occupational Profile and History Detailed Assessment- Review of Records and additional review of physical, cognitive, psychosocial history related to current functional performance    Occupational performance deficits (Please refer to evaluation for details): ADL's;IADL's    Body Structure / Function / Physical Skills ADL;Strength;Tone;Pain;UE functional use;Proprioception;Body mechanics;IADL;ROM;Flexibility;Coordination;FMC;Mobility    Rehab Potential Good    Clinical Decision Making Several treatment options, min-mod task modification necessary    Comorbidities Affecting Occupational Performance: May have comorbidities impacting occupational performance    Modification or Assistance to Complete Evaluation  No modification of tasks or assist necessary to complete eval    OT Frequency 2x / week    OT Duration 8 weeks   plus evaluation, (anticipate only 6 weeks needed)   OT Treatment/Interventions Self-care/ADL training;Moist Heat;Fluidtherapy;DME and/or AE instruction;Splinting;Therapeutic activities;Therapeutic exercise;Coping strategies training;Passive range of motion;Functional Mobility Training;Neuromuscular education;Manual  Therapy;Patient/family education;Energy conservation;Paraffin    Plan HEP for shoulder, elbow ext, supination, wrist ext (respect tenodesis)    Consulted and Agree with Plan of Care Patient           Patient will benefit from skilled therapeutic intervention in order to improve the following deficits and impairments:   Body Structure / Function / Physical Skills: ADL,Strength,Tone,Pain,UE functional use,Proprioception,Body mechanics,IADL,ROM,Flexibility,Coordination,FMC,Mobility       Visit Diagnosis: Spastic hemiplegia affecting dominant side (HCC)  Stiffness of right shoulder, not elsewhere classified  Other lack of coordination  Chronic right shoulder pain    Problem List Patient Active Problem List   Diagnosis Date Noted  . Syncope 11/22/2020  . COVID-19 03/23/2020  . 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 Spastic hemiparesis of right dominant side ([place]) 06/12/2019  . Spasticity 03/27/2019  . Depression 05/29/2018  . Prostate cancer ([place]) 09/13/2017  . Hypertension 02/17/2017  . Atherosclerosis of aorta ([place]) 11/23/2016  . 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 Dysesthesia   . History of syncope   . Tetraparesis ([place])   . Central cord syndrome ([place])   . Anemia   . ETOH abuse   . Neuropathic pain   . Neck pain   . Chronic neck and back pain 09/01/2016  . Tobacco abuse 09/01/2016    Carey Bullocks, OTR/L 11/25/2020, 1:05 PM  [facility] [address] [place], [place], [address] Phone: [phone]   Fax:  [phone]  Name: JOVAUGHN WOJTASZEK MRN: [id number] Date of Birth: 08-Feb-1961

## 2020-11-25 NOTE — Therapy (Addendum)
[facility] [address] [place], [place], [address] Phone: [phone]   Fax:  [phone]  Physical Therapy Evaluation  Patient Details  Name: Patrick Romero MRN: [id number] Date of Birth: Jun 27, 1961 Referring Provider (PT): Alger Simons, MD   Encounter Date: 11/25/2020   PT End of Session - 11/25/20 1201    Visit Number 1    Number of Visits 17    Date for PT Re-Evaluation 02/23/21   written for 60 day POC   Authorization Type Medicare    PT Start Time 0845    PT Stop Time 0928    PT Time Calculation (min) 43 min    Equipment Utilized During Treatment Gait belt    Activity Tolerance Patient tolerated treatment well    Behavior During Therapy [facility] for tasks assessed/performed           Past Medical History:  Diagnosis Date  . Arthritis    ra  . Cancer [facility])    prostate   . Cancer of kidney ([place]) 2018   part of right kidney removed  . Chronic back pain    lower back burns some too  . Chronic neck pain   . Depression   . Herniated disc, cervical   . Hypertension   . Sickle cell trait ([place])   . Stroke [facility])    weakness in right hand and leg , numbness on left     Past Surgical History:  Procedure Laterality Date  . ROBOT ASSISTED LAPAROSCOPIC RADICAL PROSTATECTOMY N/A 09/13/2017   Procedure: XI ROBOTIC ASSISTED LAPAROSCOPIC RADICAL PROSTATECTOMY, BILATERAL PELVIC LYMPH NODE DISSECTION;  Surgeon: [name], MD;  Location: [facility];  Service: Urology;  Laterality: N/A;  . ROBOTIC ASSITED PARTIAL NEPHRECTOMY Right 01/04/2017   Procedure: XI ROBOTIC ASSITED PARTIAL NEPHRECTOMY;  Surgeon: [name], MD;  Location: [facility];  Service: Urology;  Laterality: Right;    Vitals:   11/25/20 0856  BP: 128/74  Pulse: (!) 101  SpO2: 98%      Subjective Assessment - 11/25/20 0847    Subjective Feels like his right side isn't getting stronger - slow progress. Reports he got botox on his right side  previously due to spasticity, pt reports that it was not effective. Walks with a SPC with quad tip mostly in public. Has a problem with standing/stumbling on uneven ground. Does not use his cane when he is inside the house. Patient was admitted to the hospital a couple days ago with a syncopal episode in the setting of EtOH use.  His cardiac work-up was fairly unremarkable. MRI showed no evidence of acute intracranial abnormality. Had a couple of falls in the house - tripped on the patio and tripped over a rug. Was not injured. Had COVID last year - used to have to use portable oxygen, but not having to use it on a daily basis anymore.    Pertinent History central cord syndrome (08/2016 cervical injury from fall) and incomplete quadriparesis and spastic right hemiparesis, HTN, HL, tob dep, renal carcinoma status post partial nephrectomy 12/2016, prostate CA status post prostatectomy, anemia, ETOH,CVA, COPD, COVID 2021.    Limitations Walking    Patient Stated Goals get more strength in right arm and right leg.    Currently in Pain? No/denies              [facility] PT Assessment - 11/25/20 0858      Assessment   Medical Diagnosis Central Cord Syndrome  Referring Provider (PT) Alger Simons, MD    Onset Date/Surgical Date 11/11/20   date of referral   Hand Dominance Right    Prior Therapy last had PT at this clinic in 2020      Precautions   Precautions Fall    Precaution Comments no driving      Balance Screen   Has the patient fallen in the past 6 months Yes    How many times? 4    Has the patient had a decrease in activity level because of a fear of falling?  No   has to be more careful   Is the patient reluctant to leave their home because of a fear of falling?  No      Home Ecologist residence    Transport planner;Children;Other (Comment)   mom, sister, and son   Type of Section to enter    Entrance Stairs-Number of  Steps 3    Entrance Stairs-Rails Cannot reach both;Right;Left    Home Layout One level    [place] bars - tub/shower;Grab bars - toilet;Shower seat;Walker - standard;Cane - single point   has a RW, but does not use it   Additional Comments pt reports he is cooking, mowing the yard      Prior Function   Level of Independence Independent;Independent with household mobility with device;Independent with community mobility with device    Vocation On disability    Leisure playing games on the computer, playing cards with friends      Sensation   Light Touch Impaired Detail    Light Touch Impaired Details Impaired RLE    Proprioception Appears Intact   at B ankles   Additional Comments reports B numbness/tingling in BLE and BUE. harder to detect with RLE, reports it feels aching/dull      Coordination   Gross Motor Movements are Fluid and Coordinated No    Heel Shin Test harder to perform with RLE due to weakness/spasticity      Posture/Postural Control   Posture/Postural Control Postural limitations    Postural Limitations Rounded Shoulders;Forward head;Weight shift left      Tone   Assessment Location Right Lower Extremity      ROM / Strength   AROM / PROM / Strength AROM      AROM   Overall AROM Comments limited on RLE due to weakness/spasticity      Strength   Overall Strength Deficits    Overall Strength Comments LLE grossly at 5/5 in sitting    Right/Left Hip Left    Right Hip Flexion 3-/5   leans to L   Right Knee Flexion 3+/5    Right Knee Extension 3-/5    Right Ankle Dorsiflexion 3-/5      Transfers   Transfers Stand to Sit;Sit to Stand    Sit to Stand 5: Supervision;Without upper extremity assist;From chair/3-in-1    Five time sit to stand comments  26.34 seconds from standard chair, wider BOS, incr weight shift to L when performing    Stand to Sit 5: Supervision;Without upper extremity assist      Ambulation/Gait   Ambulation/Gait Yes     Ambulation/Gait Assistance 5: Supervision;4: Min guard    Ambulation/Gait Assistance Details clinic distances    Assistive device Straight cane   with 4 prong tip   Gait Pattern Step-to pattern;Decreased arm swing - right;Decreased step length - left;Decreased stance time -  right;Decreased stride length;Decreased hip/knee flexion - right;Decreased dorsiflexion - right;Decreased weight shift to right;Right hip hike;Right foot flat;Antalgic;Poor foot clearance - right;Step-through pattern    Ambulation Surface Level;Indoor    Gait velocity 12.34 seconds = 2.65 ft/sec   with cane   Gait Comments reports a lot of difficulty with ambulating outdoors over unlevel ground, pt reporting incr hip/back pain after gait.      Standardized Balance Assessment   Standardized Balance Assessment Dynamic Gait Index;Timed Up and Go Test      Dynamic Gait Index   Level Surface Moderate Impairment    Change in Gait Speed Moderate Impairment    Gait with Horizontal Head Turns Mild Impairment    Gait with Vertical Head Turns Mild Impairment    Gait and Pivot Turn Moderate Impairment    Step Over Obstacle Moderate Impairment    Step Around Obstacles Mild Impairment    Steps Moderate Impairment    Total Score 11    DGI comment: high fall risk      Timed Up and Go Test   Normal TUG (seconds) 13.56   no AD, needs min guard when turning     RLE Tone   RLE Tone Hypertonic                      Objective measurements completed on examination: See above findings.        PT Short Term Goals - 11/29/20 1629      PT SHORT TERM GOAL #1   Title Pt will be independent with initial HEP in order to build upon functional gains made in therapy. ALL STGS DUE 12/27/20    Time 4    Period Weeks    Status New    Target Date 12/27/20      PT SHORT TERM GOAL #2   Title Patient ambulates 300' outdoor on paved surfaces with straight cane with supervision in order to demo improved community mobility.    Time  4    Period Weeks    Status New      PT SHORT TERM GOAL #3   Title Pt will verbalize understanding of fall prevention in the home.    Time 4    Period Weeks    Status New      PT SHORT TERM GOAL #4   Title Pt will decr TUG to 12 seconds or less with no AD vs. LRAD  in order to demo decr fall risk.    Baseline 13.56 seconds    Time 4    Period Weeks    Status New      PT SHORT TERM GOAL #5   Title Pt will decr 5x sit <> stand time to 22 seconds or less in order to demo improved functional BLE strength.    Baseline 26.34 seconds no UE support    Time 4    Period Weeks    Status New           PT Long Term Goals - 11/29/20 1634      PT LONG TERM GOAL #1   Title Pt will be independent with final HEP in order to build upon functional gains made in therapy. ALL LTGS DUE 01/24/21    Time 8    Period Weeks    Status New    Target Date 01/24/21      PT LONG TERM GOAL #2   Title Pt will ambulate at least 500' outdoors over  paved/grass surfaces mod I with LRAD in order to demo improved community mobility.    Time 8    Period Weeks    Status New      PT LONG TERM GOAL #3   Title Pt will improve DGI score to at least a 14/24 in order to demo decr fall risk    Baseline 11/24    Time 8    Period Weeks    Status New      PT LONG TERM GOAL #4   Title Pt will improve gait speed with LRAD to at least 2.9 ft/sec in order  to demo improved community mobility.    Baseline 2.65 ft/sec with SPC.    Time 8    Period Weeks    Status New      PT LONG TERM GOAL #5   Title Pt will decr 5x sit <> stand time to 19 seconds or less in order to demo improved functional BLE strength.    Baseline 26.34 seconds no UE support    Time 8    Period Weeks    Status New                    PT Education - 11/25/20 1200    Education Details clinical findings, POC    Person(s) Educated Patient    Methods Explanation    Comprehension Verbalized understanding                11/29/20 1624  Plan  Clinical Impression Statement Pt is a 60 y.o. male who presents to [facility] with central cord syndrome from spinal injury in 2018 (from fall) resulting in Rt dominant side spastic hemiparesis. Last received PT at this location in 2020. PMH includes Rt renal carcinoma s/p partial nephrectomy 2018, prostate CA, ETOH abuse, HTN. Pt recently hospitalized due to syncope from ETOH abuse. The following deficits were present during the exam: R hemiparesis, decr strength, decr ROM, impaired balance, gait abornmalities, impaired tone, impaired sensation, postural abnormalities.  Based on TUG, DGI, 5x sit <> stand,  pt is an incr risk for falls. Pt would benefit from skilled PT to address these impairments and functional limitations to maximize functional mobility independence.  Personal Factors and Comorbidities Comorbidity 3+;Past/Current Experience;Time since onset of injury/illness/exacerbation  Comorbidities HTN,  renal carcinoma status post partial nephrectomy 12/2016, prostate CA status post prostatectomy, central cord syndrome (08/2016 cervical injury from fall) and spastic right hemiparesis,anemia, ETOH,CVA, COPD, COVID 2021.  Examination-Activity Limitations Transfers;Locomotion Level;Stairs;Squat  Examination-Participation Restrictions Community Activity;Driving;Occupation  Pt will benefit from skilled therapeutic intervention in order to improve on the following deficits Abnormal gait;Decreased activity tolerance;Decreased balance;Decreased coordination;Decreased endurance;Decreased knowledge of use of DME;Decreased mobility;Decreased range of motion;Decreased strength;Dizziness;Increased muscle spasms;Impaired flexibility;Impaired sensation;Impaired tone;Postural dysfunction;Pain  Stability/Clinical Decision Making Stable/Uncomplicated  Clinical Decision Making Low  Rehab Potential Good  PT Frequency 2x / week  PT Duration 8 weeks  PT Treatment/Interventions ADLs/Self Care Home  Management;DME Instruction;Therapeutic activities;Therapeutic exercise;Balance training;Neuromuscular re-education;Functional mobility training;Patient/family education;Vestibular;Aquatic Therapy;Stair training;Gait training;Manual techniques;Passive range of motion  PT Next Visit Plan initial HEP for ROM, strength, balance.  Consulted and Agree with Plan of Care Patient         Patient will benefit from skilled therapeutic intervention in order to improve the following deficits and impairments:     Visit Diagnosis: Unsteadiness on feet  Other abnormalities of gait and mobility  Other symptoms and signs involving the nervous system  Muscle weakness (generalized)  Problem List Patient Active Problem List   Diagnosis Date Noted  . Syncope 11/22/2020  . COVID-19 03/23/2020  . 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 Spastic hemiparesis of right dominant side ([place]) 06/12/2019  . Spasticity 03/27/2019  . Depression 05/29/2018  . Prostate cancer ([place]) 09/13/2017  . Hypertension 02/17/2017  . Atherosclerosis of aorta ([place]) 11/23/2016  . 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 Dysesthesia   . History of syncope   . Tetraparesis ([place])   . Central cord syndrome ([place])   . Anemia   . ETOH abuse   . Neuropathic pain   . Neck pain   . Chronic neck and back pain 09/01/2016  . Tobacco abuse 09/01/2016    Arliss Journey , PT, DPT  11/25/2020, 12:02 PM  [facility] [address] [place], [place], [address] Phone: [phone]   Fax:  [phone]  Name: Patrick Romero MRN: [id number] Date of Birth: 01-29-1961

## 2020-11-29 NOTE — Addendum Note (Signed)
Addended by: Arliss Journey on: 11/29/2020 04:40 PM   Modules accepted: Orders

## 2020-12-02 ENCOUNTER — Ambulatory Visit: Payer: Medicare Other | Admitting: Physical Therapy

## 2020-12-02 ENCOUNTER — Encounter: Payer: Self-pay | Admitting: Physical Therapy

## 2020-12-02 ENCOUNTER — Ambulatory Visit: Payer: Medicare Other | Admitting: Occupational Therapy

## 2020-12-02 ENCOUNTER — Other Ambulatory Visit: Payer: Self-pay

## 2020-12-02 DIAGNOSIS — R29818 Other symptoms and signs involving the nervous system: Secondary | ICD-10-CM

## 2020-12-02 DIAGNOSIS — R2689 Other abnormalities of gait and mobility: Secondary | ICD-10-CM

## 2020-12-02 DIAGNOSIS — M6281 Muscle weakness (generalized): Secondary | ICD-10-CM

## 2020-12-02 DIAGNOSIS — G8110 Spastic hemiplegia affecting unspecified side: Secondary | ICD-10-CM

## 2020-12-02 DIAGNOSIS — R2681 Unsteadiness on feet: Secondary | ICD-10-CM | POA: Diagnosis not present

## 2020-12-02 DIAGNOSIS — G8929 Other chronic pain: Secondary | ICD-10-CM

## 2020-12-02 DIAGNOSIS — M25511 Pain in right shoulder: Secondary | ICD-10-CM

## 2020-12-02 DIAGNOSIS — R278 Other lack of coordination: Secondary | ICD-10-CM

## 2020-12-02 DIAGNOSIS — M25611 Stiffness of right shoulder, not elsewhere classified: Secondary | ICD-10-CM

## 2020-12-02 NOTE — Therapy (Signed)
[facility] [address] [place], [place], [address] Phone: [phone]   Fax:  [phone]  Occupational Therapy Treatment  Patient Details  Name: Patrick Romero MRN: [id number] Date of Birth: [birth year] Referring Provider (OT): Dr. Naaman Plummer   Encounter Date: 12/02/2020   OT End of Session - 12/02/20 1230    Visit Number 2    Number of Visits 17    Date for OT Re-Evaluation 01/25/21    Authorization Type MCR/MCD    Progress Note Due on Visit 10    OT Start Time 0930    OT Stop Time 1015    OT Time Calculation (min) 45 min    Activity Tolerance Patient tolerated treatment well    Behavior During Therapy [facility] for tasks assessed/performed           Past Medical History:  Diagnosis Date  . Arthritis    ra  . Cancer [facility])    prostate   . Cancer of kidney ([place]) 2018   part of right kidney removed  . Chronic back pain    lower back burns some too  . Chronic neck pain   . Depression   . Herniated disc, cervical   . Hypertension   . Sickle cell trait ([place])   . Stroke [facility])    weakness in right hand and leg , numbness on left     Past Surgical History:  Procedure Laterality Date  . ROBOT ASSISTED LAPAROSCOPIC RADICAL PROSTATECTOMY N/A 09/13/2017   Procedure: XI ROBOTIC ASSISTED LAPAROSCOPIC RADICAL PROSTATECTOMY, BILATERAL PELVIC LYMPH NODE DISSECTION;  Surgeon: [name], MD;  Location: [facility];  Service: Urology;  Laterality: N/A;  . ROBOTIC ASSITED PARTIAL NEPHRECTOMY Right 01/04/2017   Procedure: XI ROBOTIC ASSITED PARTIAL NEPHRECTOMY;  Surgeon: [name], MD;  Location: [facility];  Service: Urology;  Laterality: Right;    There were no vitals filed for this visit.   Subjective Assessment - 12/02/20 0929    Subjective  The weather makes the pain a little worse; plus I did more home cleaning yesterday which contributed to pain    Pertinent History central cord syndrome 2018 w/ Rt dominant side  spastic hemiplegia and Rt adhesive capsulitis. PMH: Rt renal carcinoma s/p partial nephrectomy 2018, prostate CA, ETOH abuse, HTN    Patient Stated Goals Get more strength on my Rt side    Currently in Pain? Yes    Pain Score 8     Pain Location Shoulder    Pain Orientation Right    Pain Descriptors / Indicators Aching;Dull;Throbbing;Tightness    Pain Type Chronic pain    Pain Onset More than a month ago    Pain Frequency Constant    Aggravating Factors  rainy/cold weather, doing more housework    Pain Relieving Factors heating pad           Pt issued HEP for RUE and trunk - see pt instructions for details. Pt demo each. Discussed potential A/E needs for safety to prevent cuts and burns as well as button hook. Pt provided w/ handouts and told how/where to purchase.                      OT Education - 12/02/20 0958    Education Details HEP, A/E to help prevent cuts/burns and button hook    Person(s) Educated Patient    Methods Explanation;Demonstration;Verbal cues;Handout    Comprehension Verbalized understanding;Returned demonstration  OT Short Term Goals - 12/02/20 1230      OT SHORT TERM GOAL #1   Title Pt will be independent with HEP for RUE (shoulder, elbow ext, supination, wrist ext)    Time 4    Period Weeks    Status On-going      OT SHORT TERM GOAL #2   Title Pt will verbalize understanding of safety considerations w/ sensory deficits during cooking tasks including use of A/E prn    Time 4    Period Weeks    Status On-going      OT SHORT TERM GOAL #3   Title Pt will feed self 100% with RUE    Baseline 80% per pt report    Time 4    Period Weeks    Status New      OT SHORT TERM GOAL #4   Title Pt to verbalize understanding with use of button hook and acquisition of AE prn    Time 4    Period Weeks    Status Achieved             OT Long Term Goals - 11/25/20 1259      OT LONG TERM GOAL #1   Title Pt will perform  functional reaching to midrange consistently with increased distal control    Time 8    Period Weeks    Status New      OT LONG TERM GOAL #2   Title Pt will tolerate 145* passive shoulder flexion w/ pain less than or equal to 5/10    Baseline 135* w/ pain fluctuating    Time 8    Period Weeks    Status New      OT LONG TERM GOAL #3   Title Pt will improve functional use of RUE as evidenced by performing 40 or more on [employer] test    Baseline 38    Time 8    Period Weeks    Status New      OT LONG TERM GOAL #4   Title Pt will demo some in hand manipulation Rt hand to manipulate 1/2" objects    Time 8    Period Weeks    Status New                 Plan - 12/02/20 1231    Clinical Impression Statement Pt progressing towards goals. Pt tolerating HEP well    OT Occupational Profile and History Detailed Assessment- Review of Records and additional review of physical, cognitive, psychosocial history related to current functional performance    Occupational performance deficits (Please refer to evaluation for details): ADL's;IADL's    Body Structure / Function / Physical Skills ADL;Strength;Tone;Pain;UE functional use;Proprioception;Body mechanics;IADL;ROM;Flexibility;Coordination;FMC;Mobility    Rehab Potential Good    Clinical Decision Making Several treatment options, min-mod task modification necessary    Comorbidities Affecting Occupational Performance: May have comorbidities impacting occupational performance    Modification or Assistance to Complete Evaluation  No modification of tasks or assist necessary to complete eval    OT Frequency 2x / week    OT Duration 8 weeks   plus evaluation, (anticipate only 6 weeks needed)   OT Treatment/Interventions Self-care/ADL training;Moist Heat;Fluidtherapy;DME and/or AE instruction;Splinting;Therapeutic activities;Therapeutic exercise;Coping strategies training;Passive range of motion;Functional Mobility Training;Neuromuscular  education;Manual Therapy;Patient/family education;Energy conservation;[place]    Plan review HEP prn, continue discussion on safety considerations d/t decreased sensation of temperature, functional reaching RUE, Wt bearing    Consulted and Agree with  Plan of Care Patient           Patient will benefit from skilled therapeutic intervention in order to improve the following deficits and impairments:   Body Structure / Function / Physical Skills: ADL,Strength,Tone,Pain,UE functional use,Proprioception,Body mechanics,IADL,ROM,Flexibility,Coordination,FMC,Mobility       Visit Diagnosis: Spastic hemiplegia affecting dominant side (HCC)  Stiffness of right shoulder, not elsewhere classified  Chronic right shoulder pain  Other lack of coordination    Problem List Patient Active Problem List   Diagnosis Date Noted  . Syncope 11/22/2020  . COVID-19 03/23/2020  . 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 Spastic hemiparesis of right dominant side ([place]) 06/12/2019  . Spasticity 03/27/2019  . Depression 05/29/2018  . Prostate cancer ([place]) 09/13/2017  . Hypertension 02/17/2017  . Atherosclerosis of aorta ([place]) 11/23/2016  . 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 Dysesthesia   . History of syncope   . Tetraparesis ([place])   . Central cord syndrome ([place])   . Anemia   . ETOH abuse   . Neuropathic pain   . Neck pain   . Chronic neck and back pain 09/01/2016  . Tobacco abuse 09/01/2016    Carey Bullocks, OTR/L 12/02/2020, 1:22 PM  [facility] [address] [place], [place], [address] Phone: [phone]   Fax:  [phone]  Name: KEAGHAN STATON MRN: [id number] Date of Birth: 14-Jan-1961

## 2020-12-02 NOTE — Patient Instructions (Signed)
Lumbar Rotation: Caudal - Bilateral, Advanced (Supine)    Feet and knees together, arms outstretched, bring knees toward chest. Rotate knees to left, turning head in opposite direction until stretch is felt. Hold _10-20___ seconds. Relax. Repeat _3___ times eacy way.  Do _2___ sessions per day.  Scapular: Retraction in External Rotation    With hands clasped behind head, elbows up, pull elbows back, pinching shoulder blades together. Hold out 10 seconds Repeat _3-5___ times per set. Do __2__ sessions per day.  Flexion (Assistive)    Hold Rt wrist with Lt hand, Rt thumb side up, and raise arms above head, keeping elbows as straight as possible. Do lying down. HOLD 5 sec.  Repeat _10___ times. Do _2___ sessions per day.  Flexion (Passive)    Sitting upright, slide forearms forward along table, bending from the waist until a stretch is felt. Hold __5__ seconds. Repeat _15___ times. Do __2__ sessions per day. (5 straight forward, 5 to one side, and 5 to another side)  Supination (Passive)    Keep elbow bent at right angle and held firmly at side. Use other hand to turn forearm until palm faces upward. Hold _10___ seconds. Repeat __5__ times. Do __2__ sessions per day.  Extension (Passive)    Using other hand, lift hand at wrist as far as possible. LET FINGERS RELAX (they will naturally curl) Hold _10___ seconds. Repeat _5___ times. Do _2___ sessions per day.  **Can open and stretch fingers and thumb w/ wrist slightly down/bent. Hold 10 sec. Repeat 5 times. Do 2 sessions per day.

## 2020-12-02 NOTE — Therapy (Addendum)
[facility] [address] [place], [place], [address] Phone: [phone]   Fax:  [phone]  Physical Therapy Treatment  Patient Details  Name: Patrick Romero MRN: [id number] Date of Birth: June 22, 1961 Referring Provider (PT): Alger Simons, MD   Encounter Date: 12/02/2020   PT End of Session - 12/02/20 0929    Visit Number 2    Number of Visits 17    Date for PT Re-Evaluation 02/23/21   written for 60 day POC   Authorization Type Medicare    PT Start Time 0846    PT Stop Time 0927    PT Time Calculation (min) 41 min    Equipment Utilized During Treatment Gait belt    Activity Tolerance Patient tolerated treatment well    Behavior During Therapy [facility] for tasks assessed/performed           Past Medical History:  Diagnosis Date  . Arthritis    ra  . Cancer [facility])    prostate   . Cancer of kidney ([place]) 2018   part of right kidney removed  . Chronic back pain    lower back burns some too  . Chronic neck pain   . Depression   . Herniated disc, cervical   . Hypertension   . Sickle cell trait ([place])   . Stroke [facility])    weakness in right hand and leg , numbness on left     Past Surgical History:  Procedure Laterality Date  . ROBOT ASSISTED LAPAROSCOPIC RADICAL PROSTATECTOMY N/A 09/13/2017   Procedure: XI ROBOTIC ASSISTED LAPAROSCOPIC RADICAL PROSTATECTOMY, BILATERAL PELVIC LYMPH NODE DISSECTION;  Surgeon: [name], MD;  Location: [facility];  Service: Urology;  Laterality: N/A;  . ROBOTIC ASSITED PARTIAL NEPHRECTOMY Right 01/04/2017   Procedure: XI ROBOTIC ASSITED PARTIAL NEPHRECTOMY;  Surgeon: [name], MD;  Location: [facility];  Service: Urology;  Laterality: Right;    There were no vitals filed for this visit.   Subjective Assessment - 12/02/20 0848    Subjective Nothing new since he was last here. No falls.    Pertinent History central cord syndrome (08/2016 cervical injury from fall) and incomplete  quadriparesis and spastic right hemiparesis, HTN, HL, tob dep, renal carcinoma status post partial nephrectomy 12/2016, prostate CA status post prostatectomy, anemia, ETOH,CVA, COPD, COVID 2021.    Limitations Walking    Patient Stated Goals get more strength in right arm and right leg.    Currently in Pain? Yes    Pain Score 8     Pain Location --   entire R side - from shoulder, hip, back and RLE   Pain Orientation Right    Pain Descriptors / Indicators Dull;Aching;Tingling    Pain Type Chronic pain    Aggravating Factors  rainy/cold weather, doing more housework.    Pain Relieving Factors using a heating pad.                             [facility] Adult PT Treatment/Exercise - 12/02/20 0001      Ambulation/Gait   Gait Comments walking into session with SPC pt catching his R toe on floor, able to correct balance with min guard               Balance Exercises - 12/02/20 0922      Balance Exercises: Standing   Standing Eyes Opened Narrow base of support (BOS);Limitations    Standing Eyes Opened Limitations  feet together on 2 pillows; x10 reps head turns, x10 reps head nods    Standing Eyes Closed Foam/compliant surface;Limitations    Standing Eyes Closed Limitations on 2 pillows; 2 x30 seconds eyes closed    Tandem Stance Eyes open;30 secs;Limitations    Tandem Stance Time with RLE posteriorly; 3 x 30 seconds with intermitent taps to wall/chair for balance    SLS with Vectors Solid surface;Intermittent upper extremity assist;Limitations    SLS with Vectors Limitations alternating SLS taps to 2 cones, cues for weight shifting and hip/knee flexion with RLE, cues for glute activation during stance time x15 reps B beginning with UE support > none with min guard           Access Code: [email or web address] URL: [email or web address] Date: 12/02/2020 Prepared by: Janann August  See [place] for full details for initial HEP.   Exercises Seated Hamstring  Stretch - 2 x daily - 5 x weekly - 3 sets - 20-30 hold Seated Long Arc Quad - 2 x daily - 5 x weekly - 2 sets - 10 reps Seated Knee Flexion with Anchored Resistance - 2 x daily - 5 x weekly - 2 sets - 10 reps Staggered Sit-to-Stand - 2 x daily - 5 x weekly - 1 sets - 10 reps Supine Bridge - 2 x daily - 5 x weekly - 1-2 sets - 10 reps   PT Education - 12/02/20 0929    Education Details initial HEP    Person(s) Educated Patient    Methods Explanation;Demonstration;Handout;Verbal cues    Comprehension Verbalized understanding;Returned demonstration            PT Short Term Goals - 11/29/20 1629      PT SHORT TERM GOAL #1   Title Pt will be independent with initial HEP in order to build upon functional gains made in therapy. ALL STGS DUE 12/27/20    Time 4    Period Weeks    Status New    Target Date 12/27/20      PT SHORT TERM GOAL #2   Title Patient ambulates 300' outdoor on paved surfaces with straight cane with supervision in order to demo improved community mobility.    Time 4    Period Weeks    Status New      PT SHORT TERM GOAL #3   Title Pt will verbalize understanding of fall prevention in the home.    Time 4    Period Weeks    Status New      PT SHORT TERM GOAL #4   Title Pt will decr TUG to 12 seconds or less with no AD vs. LRAD  in order to demo decr fall risk.    Baseline 13.56 seconds    Time 4    Period Weeks    Status New      PT SHORT TERM GOAL #5   Title Pt will decr 5x sit <> stand time to 22 seconds or less in order to demo improved functional BLE strength.    Baseline 26.34 seconds no UE support    Time 4    Period Weeks    Status New             PT Long Term Goals - 11/29/20 1634      PT LONG TERM GOAL #1   Title Pt will be independent with final HEP in order to build upon functional gains made in therapy. ALL LTGS DUE 01/24/21    Time 8  Period Weeks    Status New    Target Date 01/24/21      PT LONG TERM GOAL #2   Title Pt will  ambulate at least 500' outdoors over paved/grass surfaces mod I with LRAD in order to demo improved community mobility.    Time 8    Period Weeks    Status New      PT LONG TERM GOAL #3   Title Pt will improve DGI score to at least a 14/24 in order to demo decr fall risk    Baseline 11/24    Time 8    Period Weeks    Status New      PT LONG TERM GOAL #4   Title Pt will improve gait speed with LRAD to at least 2.9 ft/sec in order  to demo improved community mobility.    Baseline 2.65 ft/sec with SPC.    Time 8    Period Weeks    Status New      PT LONG TERM GOAL #5   Title Pt will decr 5x sit <> stand time to 19 seconds or less in order to demo improved functional BLE strength.    Baseline 26.34 seconds no UE support    Time 8    Period Weeks    Status New                 Plan - 12/02/20 1203    Clinical Impression Statement Initiated pt's HEP for strengthening and RLE ROM with pt able to tolerate well. When ambulating into clinic pt caught his R toe on the floor, able to maintain his balance. Discussed importance of using cane at all times even if the house to have extra stability. Will continue to progress towards LTGs    Personal Factors and Comorbidities Comorbidity 3+;Past/Current Experience;Time since onset of injury/illness/exacerbation    Comorbidities HTN,  renal carcinoma status post partial nephrectomy 12/2016, prostate CA status post prostatectomy, central cord syndrome (08/2016 cervical injury from fall) and spastic right hemiparesis,anemia, ETOH,CVA, COPD, COVID 2021.    Examination-Activity Limitations Transfers;Locomotion Level;Romero;Squat    Examination-Participation Restrictions Community Activity;Driving;Occupation    Stability/Clinical Decision Making Stable/Uncomplicated    Rehab Potential Good    PT Frequency 2x / week    PT Duration 8 weeks    PT Treatment/Interventions ADLs/Self Care Home Management;DME Instruction;Therapeutic activities;Therapeutic  exercise;Balance training;Neuromuscular re-education;Functional mobility training;Patient/family education;Vestibular;Aquatic Therapy;Stair training;Gait training;Manual techniques;Passive range of motion    PT Next Visit Plan continue with RLE strengthening, balance - SLS, compliant surfaces. activities for RLE stance. may want to try foot up brace or toe cap for foot clearance    Consulted and Agree with Plan of Care Patient           Patient will benefit from skilled therapeutic intervention in order to improve the following deficits and impairments:  Abnormal gait,Decreased activity tolerance,Decreased balance,Decreased coordination,Decreased endurance,Decreased knowledge of use of DME,Decreased mobility,Decreased range of motion,Decreased strength,Dizziness,Increased muscle spasms,Impaired flexibility,Impaired sensation,Impaired tone,Postural dysfunction,Pain  Visit Diagnosis: Unsteadiness on feet  Other symptoms and signs involving the nervous system  Other abnormalities of gait and mobility  Muscle weakness (generalized)     Problem List Patient Active Problem List   Diagnosis Date Noted  . Syncope 11/22/2020  . COVID-19 03/23/2020  . 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 Spastic hemiparesis of right dominant side ([place]) 06/12/2019  . Spasticity 03/27/2019  . Depression 05/29/2018  . Prostate cancer ([place]) 09/13/2017  . Hypertension 02/17/2017  .  Atherosclerosis of aorta ([place]) 11/23/2016  . 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 Dysesthesia   . History of syncope   . Tetraparesis ([place])   . Central cord syndrome ([place])   . Anemia   . ETOH abuse   . Neuropathic pain   . Neck pain   . Chronic neck and back pain 09/01/2016  . Tobacco abuse 09/01/2016    Arliss Journey, PT, DPT  12/02/2020, 12:06 PM  [facility] [address] [place], [place], [address] Phone: [phone]   Fax:   [phone]  Name: Patrick Romero MRN: [id number] Date of Birth: Dec 06, 1960

## 2020-12-02 NOTE — Patient Instructions (Signed)
Access Code: [email or web address] URL: [email or web address] Date: 12/02/2020 Prepared by: Janann August  Exercises Seated Hamstring Stretch - 2 x daily - 5 x weekly - 3 sets - 20-30 hold Seated Long Arc Quad - 2 x daily - 5 x weekly - 2 sets - 10 reps Seated Knee Flexion with Anchored Resistance - 2 x daily - 5 x weekly - 2 sets - 10 reps Staggered Sit-to-Stand - 2 x daily - 5 x weekly - 1 sets - 10 reps Supine Bridge - 2 x daily - 5 x weekly - 1-2 sets - 10 reps

## 2020-12-04 ENCOUNTER — Ambulatory Visit: Payer: Medicare Other | Admitting: Occupational Therapy

## 2020-12-04 ENCOUNTER — Ambulatory Visit: Payer: Medicare Other | Admitting: Physical Therapy

## 2020-12-09 ENCOUNTER — Ambulatory Visit: Payer: Medicare Other | Admitting: Physical Therapy

## 2020-12-09 ENCOUNTER — Ambulatory Visit: Payer: Medicare Other | Admitting: Occupational Therapy

## 2020-12-09 HISTORY — PX: CATARACT EXTRACTION: SUR2

## 2020-12-11 ENCOUNTER — Ambulatory Visit: Payer: Medicare Other | Admitting: Occupational Therapy

## 2020-12-11 ENCOUNTER — Ambulatory Visit: Payer: Medicare Other | Admitting: Physical Therapy

## 2020-12-16 ENCOUNTER — Ambulatory Visit: Payer: Medicare Other | Admitting: Physical Therapy

## 2020-12-16 ENCOUNTER — Encounter: Payer: Medicare Other | Admitting: Occupational Therapy

## 2020-12-18 ENCOUNTER — Ambulatory Visit: Payer: Medicare Other | Admitting: Occupational Therapy

## 2020-12-18 ENCOUNTER — Ambulatory Visit: Payer: Medicare Other | Admitting: Physical Therapy

## 2020-12-21 ENCOUNTER — Telehealth: Payer: Self-pay | Admitting: Physical Therapy

## 2020-12-21 ENCOUNTER — Ambulatory Visit: Payer: Medicare Other | Admitting: Physical Therapy

## 2020-12-21 ENCOUNTER — Ambulatory Visit: Payer: Medicare Other | Admitting: *Deleted

## 2020-12-21 ENCOUNTER — Encounter: Payer: Self-pay | Admitting: Physical Therapy

## 2020-12-21 NOTE — Therapy (Signed)
[facility] [address] [place], [place], [address] Phone: [phone]   Fax:  [phone]  Patient Details  Name: COLEBY YETT MRN: [id number] Date of Birth: 05/17/1961 Referring Provider:  No ref. provider found  Encounter Date: 12/21/2020  Called patient after no-show appointment on 12/21/20. Pt reports that he does no think that he can make it to therapy at this time and would like to cancel all future OT and PT appts. Discussed with patient that he would need a new referral in order to return to therapy in the future. Pt verbalized understanding.  PHYSICAL THERAPY DISCHARGE SUMMARY  Visits from Start of Care: 2  Current functional level related to goals / functional outcomes: See eval. Pt only seen for 1 PT treatment session.    Remaining deficits: Impaired gait, impaired balance, impaired tone, gait abnormalities, decr strength.    Education / Equipment: Initial HEP    Patient agrees to discharge. Patient goals were not met. Patient is being discharged due to the patient's request.  Arliss Journey, PT, DPT  12/21/2020, 10:07 AM  [facility] [address] [place], [place], [address] Phone: [phone]   Fax:  [phone]

## 2020-12-23 ENCOUNTER — Ambulatory Visit: Payer: Medicare Other | Admitting: Physical Therapy

## 2020-12-23 ENCOUNTER — Ambulatory Visit: Payer: Medicare Other | Admitting: Occupational Therapy

## 2020-12-25 ENCOUNTER — Ambulatory Visit: Payer: Medicare Other | Admitting: Internal Medicine

## 2020-12-28 ENCOUNTER — Ambulatory Visit: Payer: Medicare Other | Admitting: Physical Therapy

## 2020-12-28 ENCOUNTER — Encounter: Payer: Medicare Other | Admitting: Occupational Therapy

## 2020-12-30 ENCOUNTER — Ambulatory Visit: Payer: Medicare Other | Admitting: Physical Therapy

## 2020-12-30 ENCOUNTER — Encounter: Payer: Medicare Other | Admitting: Occupational Therapy

## 2021-01-04 ENCOUNTER — Encounter: Payer: Medicare Other | Admitting: Occupational Therapy

## 2021-01-04 ENCOUNTER — Ambulatory Visit: Payer: Medicare Other | Admitting: Physical Therapy

## 2021-01-06 ENCOUNTER — Encounter: Payer: Medicare Other | Admitting: Occupational Therapy

## 2021-01-06 ENCOUNTER — Ambulatory Visit: Payer: Medicare Other | Admitting: Physical Therapy

## 2021-01-12 ENCOUNTER — Ambulatory Visit: Payer: Medicare Other | Admitting: Physical Therapy

## 2021-01-13 ENCOUNTER — Ambulatory Visit: Payer: Medicare Other | Admitting: Physical Therapy

## 2021-01-19 ENCOUNTER — Ambulatory Visit: Payer: Medicare Other | Admitting: Physical Therapy

## 2021-01-21 ENCOUNTER — Ambulatory Visit: Payer: Medicare Other | Admitting: Physical Therapy

## 2021-02-10 ENCOUNTER — Encounter: Payer: Medicare Other | Attending: Physical Medicine & Rehabilitation | Admitting: Physical Medicine & Rehabilitation

## 2021-02-10 DIAGNOSIS — G8111 Spastic hemiplegia affecting right dominant side: Secondary | ICD-10-CM | POA: Insufficient documentation

## 2021-02-10 DIAGNOSIS — S14129S Central cord syndrome at unspecified level of cervical spinal cord, sequela: Secondary | ICD-10-CM | POA: Insufficient documentation

## 2021-02-10 DIAGNOSIS — M7501 Adhesive capsulitis of right shoulder: Secondary | ICD-10-CM | POA: Insufficient documentation

## 2021-02-12 ENCOUNTER — Telehealth: Payer: Self-pay | Admitting: *Deleted

## 2021-02-12 NOTE — Telephone Encounter (Signed)
Per Dr Naaman Plummer request, Mr Fechter will be discharged for no shows and cancellations.  Letter mailed.

## 2021-02-23 ENCOUNTER — Other Ambulatory Visit: Payer: Self-pay

## 2021-02-23 ENCOUNTER — Ambulatory Visit: Payer: Medicare Other | Attending: Internal Medicine | Admitting: Internal Medicine

## 2021-02-23 ENCOUNTER — Encounter: Payer: Self-pay | Admitting: Internal Medicine

## 2021-02-23 ENCOUNTER — Other Ambulatory Visit: Payer: Self-pay | Admitting: Physical Medicine & Rehabilitation

## 2021-02-23 VITALS — BP 128/90 | HR 106 | Temp 98.7°F | Ht 72.0 in | Wt 156.0 lb

## 2021-02-23 DIAGNOSIS — D329 Benign neoplasm of meninges, unspecified: Secondary | ICD-10-CM

## 2021-02-23 DIAGNOSIS — G8250 Quadriplegia, unspecified: Secondary | ICD-10-CM

## 2021-02-23 DIAGNOSIS — Z0000 Encounter for general adult medical examination without abnormal findings: Secondary | ICD-10-CM

## 2021-02-23 DIAGNOSIS — F17200 Nicotine dependence, unspecified, uncomplicated: Secondary | ICD-10-CM

## 2021-02-23 DIAGNOSIS — I10 Essential (primary) hypertension: Secondary | ICD-10-CM

## 2021-02-23 DIAGNOSIS — F17210 Nicotine dependence, cigarettes, uncomplicated: Secondary | ICD-10-CM

## 2021-02-23 DIAGNOSIS — Z23 Encounter for immunization: Secondary | ICD-10-CM

## 2021-02-23 DIAGNOSIS — Z9181 History of falling: Secondary | ICD-10-CM

## 2021-02-23 DIAGNOSIS — Z7189 Other specified counseling: Secondary | ICD-10-CM

## 2021-02-23 DIAGNOSIS — Z122 Encounter for screening for malignant neoplasm of respiratory organs: Secondary | ICD-10-CM

## 2021-02-23 DIAGNOSIS — G43101 Migraine with aura, not intractable, with status migrainosus: Secondary | ICD-10-CM

## 2021-02-23 DIAGNOSIS — Z1159 Encounter for screening for other viral diseases: Secondary | ICD-10-CM

## 2021-02-23 DIAGNOSIS — S14129S Central cord syndrome at unspecified level of cervical spinal cord, sequela: Secondary | ICD-10-CM

## 2021-02-23 NOTE — Patient Instructions (Signed)
Please look over the material that I gave you about advanced directive.  Should you execute 1, please bring a copy for our records.  You were given the pneumonia vaccine called Prevnar 20 today.  You will return in 2 weeks to get the shingles vaccine.  I have referred you to the neurologist for the meningioma and the headaches.  I have referred you for some physical therapy as discussed today.

## 2021-02-23 NOTE — Progress Notes (Signed)
 Subjective:   Patrick Romero is a 59 y.o. male who presents for a Welcome to Medicare exam.  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  On one occasion he noted some black spots in the visual field.  He denies any associated nausea or vomiting.  No photophobia.  He is unsure how long they last but states they can last for hours.  He has had 4 episodes in the last 2 months.  He was hospitalized in May of this year with syncopal episode in the setting of EtOH use and dehydration.  There was a question of seizure.  EEG was negative.  MRI of the brain showed an incidental small meningioma. CVR:  SBP elevated.  Did not take Norvasc as yet for today.  Has a monitor at home but does not check BP Objective:    Today's Vitals   02/23/21 1001  BP: 128/90  Pulse: (!) 106  Temp: 98.7 F (37.1 C)  TempSrc: Oral  SpO2: 97%  Weight: 156 lb (70.8 kg)  Height: 6' (1.829 m)   Body mass index is 21.16 kg/m.  Medications Outpatient Encounter Medications as of 02/23/2021  Medication Sig   albuterol (VENTOLIN HFA) 108 (90 Base) MCG/ACT inhaler Inhale 2 puffs into the lungs every 6 (six) hours as needed for wheezing or shortness of breath.   amitriptyline (ELAVIL) 25 MG tablet Take 1 tablet (25 mg total) by mouth at bedtime.   amLODipine (NORVASC) 5 MG tablet Take 1 tablet (5 mg total) by mouth daily.   aspirin EC 81 MG tablet Take 1 tablet (81 mg total) by mouth daily.   atorvastatin (LIPITOR) 10 MG tablet Take 1 tablet (10 mg total) by mouth daily.   baclofen (LIORESAL) 10 MG tablet Take 10 mg by mouth 4 (four) times daily.    baclofen (LIORESAL) 20 MG tablet TAKE 1 TABLET BY MOUTH FOUR TIMES A DAY (Patient taking differently: Take 20 mg by mouth 4 (four) times daily.)   BUTRANS 7.5 MCG/HR 1 patch once a week.   ferrous sulfate 325 (65 FE) MG tablet Take 1 tablet every Monday, Wednesday, and Friday.   gabapentin (NEURONTIN) 300 MG capsule TAKE 3 CAPSULES BY MOUTH 3 TIMES A DAY (Patient taking differently: Take 900 mg by mouth 3 (three) times daily.)   ketorolac (ACULAR) 0.5 % ophthalmic solution Place 1 drop into the left eye 4 (four) times daily.   NARCAN 4 MG/0.1ML LIQD nasal spray kit Place 1 spray into the nose as needed (overdose). As needed   ofloxacin (OCUFLOX) 0.3 % ophthalmic solution Place 1 drop into the left eye 4 (four) times daily.   oxyCODONE (OXY IR/ROXICODONE) 5 MG immediate release tablet    oxyCODONE-acetaminophen (PERCOCET) 10-325 MG tablet Take 1 tablet by mouth 3 (three) times daily.   prednisoLONE acetate (PRED FORTE) 1 % ophthalmic suspension Place 1 drop into the left eye in the morning, at noon, in the evening, and at bedtime.   tiZANidine (ZANAFLEX) 2 MG tablet TAKE 1-2 TABLETS (2-4 MG TOTAL) BY MOUTH 4 (FOUR) TIMES DAILY. (TAKE 2 AT BEDTIME)   Vitamin D, Ergocalciferol, (DRISDOL) 1.25 MG (50000   UNIT) CAPS capsule Take 50,000 Units by mouth once a week.   No facility-administered encounter medications on file as of 02/23/2021.     History: Past Medical History:  Diagnosis Date   Arthritis    ra   Cancer (HCC)    prostate    Cancer of kidney (HCC) 2018   part of right kidney removed   Chronic back pain    lower back burns some too   Chronic neck pain    Depression    Herniated disc, cervical    Hypertension    Sickle cell trait (HCC)    Stroke (HCC)    weakness in right hand and leg , numbness on left    Past Surgical History:  Procedure Laterality Date   CATARACT EXTRACTION Left 12/2020   ROBOT ASSISTED LAPAROSCOPIC RADICAL PROSTATECTOMY N/A 09/13/2017   Procedure: XI  ROBOTIC ASSISTED LAPAROSCOPIC RADICAL PROSTATECTOMY, BILATERAL PELVIC LYMPH NODE DISSECTION;  Surgeon: Manny, Theodore, MD;  Location: [facility];  Service: Urology;  Laterality: N/A;   ROBOTIC ASSITED PARTIAL NEPHRECTOMY Right 01/04/2017   Procedure: XI ROBOTIC ASSITED PARTIAL NEPHRECTOMY;  Surgeon: Manny, Theodore, MD;  Location: [facility];  Service: Urology;  Laterality: Right;    Family History  Problem Relation Age of Onset   Hypertension Mother    Lung cancer Father    COPD Sister    Social History   Occupational History    Comment: disabled  Tobacco Use   Smoking status: Every Day    Packs/day: 0.25    Types: Cigarettes   Smokeless tobacco: Never   Tobacco comments:    pt is also using nicotine patch  Vaping Use   Vaping Use: Never used  Substance and Sexual Activity   Alcohol use: Yes    Comment: occ beer    Drug use: Yes    Types: Marijuana   Sexual activity: Yes   Tobacco Counseling Still smoking 1/2 pk a day.  Smoke since age of 20.  Smoked 1-2 pks a day until he was dx with kidney cancer in 2018. Not ready to quit completely.  Every now and then he would wear patch or chew nicotine gum. Trying to cut back but not ready to quit.  Discussed health risks associated with smoking.  Encouraged him to quit.  Also discussed recommendations for lung cancer screening with low dose CT chest in patients who has had greater than 30-pack-year history.  He qualifies for this and would like to have it done  Immunizations and Health Maintenance Immunization History  Administered Date(s) Administered   Influenza-Unspecified 03/25/2020   PFIZER(Purple Top)SARS-COV-2 Vaccination 05/13/2020   PNEUMOCOCCAL CONJUGATE-20 02/23/2021   Pneumococcal Polysaccharide-23 01/05/2017   Tdap 05/29/2018   Health Maintenance Due  Topic Date Due   Zoster Vaccines- Shingrix (1 of 2) Never done   Pneumococcal Vaccine 0-64 Years old (2 - PCV) 01/05/2018   COVID-19 Vaccine (2 - Pfizer risk series)  06/03/2020   INFLUENZA VACCINE  02/08/2021    Activities of Daily Living In your present state of health, do you have any difficulty performing the following activities: 02/23/2021 11/23/2020  Hearing? N N  Vision? Y N  Difficulty concentrating or making decisions? N N  Walking or climbing stairs? Y Y  Dressing or bathing? N Y  Doing errands, shopping? N N  Preparing Food and eating ? N -  Using the Toilet? N -  In the past six months, have you accidently leaked urine? Y -  Do you have   problems with loss of bowel control? N -  Managing your Medications? N -  Managing your Finances? N -  Housekeeping or managing your Housekeeping? N -  Some recent data might be hidden  Had cataract surgery LT eye 12/2020.  Will have same surgery RT 03/11/2021 with Dr. Grout Did some P.T several wks ago.  More careful when climbing steps.  Has to stop and rest after about 5 steps.  Ambulates with quad cane. Fell 3 x last mth.  Once at bus stop and 2 x at his house. Stumble over a throw rug but he removed the rug since then. 2nd time he fell was a loss of balance while reach for a bowl on a top shelf in the kitchen. He loss his balance and fell backwards.  He declines home P.T at this time. Would prefer P.T for strengthing in arms and RT leg and would rather go to the rehab ctr for this rather than home P.T  Physical Exam   Constitutional: Older African-American male who is in NAD.  Clothing clean.   Head: Normocephalic. Atraumatic Ears: External right and left ear normal.   Eyes: Conjunctivae and EOM are normal. PERRLA, no scleral icterus.  Mouth: no oral lesions, good oral hygiene, throat clear without exudates Neck: Neck supple.  no thyromegaly. No cervical LN CVS: RRR, S1/S2 +, no murmurs, no gallops, no carotid bruit. No JVD Pulmonary: Effort and breath sounds normal, no stridor, rhonchi, wheezes, rales.  Neuro: Ambulates with a quad cane.  He has a slow gait with poor foot to floor clearance.  He has  weakness in all 4 extremities that is worse on the right side.  Power in the right upper and lower extremities 3-4/5 with some spasticity.  Power in the left upper and lower extremities 4/5 with some spasticity.   Advanced Directives: Does Patient Have a Medical Advance Directive?: No Would patient like information on creating a medical advance directive?: Yes (ED - Information included in AVS)    Assessment:    This is a routine wellness  examination for this patient .   Vision/Hearing screen -Whisper test is normal. Vision: 20/25 in the right eye, 20/20 LT, 20/20 both without correction  Dietary issues and exercise activities discussed:  Doing well with eating habits   Goals       Activity and Exercise Increased (pt-stated)      I want to improve my strength especially on my right side.        Depression Screen PHQ 2/9 Scores 02/23/2021 11/10/2020 02/12/2020 01/10/2020  PHQ - 2 Score 0 0 0 1  PHQ- 9 Score - - - -     Fall Risk Fall Risk  02/23/2021  Falls in the past year? 0  Comment -  Number falls in past yr: 0  Comment -  Injury with Fall? 0  Comment -  Risk Factor Category  -  Risk for fall due to : -  Risk for fall due to: Comment -  Follow up -    Cognitive Function MMSE - Mini Mental State Exam 02/23/2021  Orientation to time 5  Orientation to Place 5  Registration 3  Attention/ Calculation 5  Recall 3  Language- name 2 objects 2  Language- repeat 1  Language- follow 3 step command 3  Language- read & follow direction 1  Write a sentence 1  Copy design 1  Total score 30        Patient Care Team: Johnson,   Dalbert Batman, MD as PCP - General (Internal Medicine) Jettie Booze, MD as PCP - Cardiology (Cardiology) Dr. Schuyler Amor - ophthalmology Dr. Naaman Plummer -PMR  Plan:    1. Encounter for Medicare annual wellness exam   2. Tobacco dependence Advised to quit.  Discussed health risks associated with smoking.  Patient not willing to give a trial of  quitting at this time but states he has been cutting back.  He has more than a 30-pack-year history for smoking.  He was agreeable to doing low-dose CT scan for lung cancer screening but it is not covered under his insurance plan and patient states he would not be able to afford to pay out-of-pocket.  3. History of recent fall 4. Quadriparesis [facility]) -I will refer him for some physical therapy for strengthening.  I felt he would have benefited more from home physical therapy to be taught some safety techniques given that 2 of his falls recently were at home.  Patient does not want home physical therapy. - Ambulatory referral to Physical Therapy   5. Essential hypertension Diastolic blood pressure not at goal.  He has not taken Norvasc as yet for today.  Encouraged him to continue the Norvasc and limit salt in foods.  Advised to check his blood pressure at home at least twice a week with goal being 130/80 or lower. If DBP remains >80, he should call to let me know so that dose of Norvasc can be increased.  6. COVID-19 vaccine series started Encouraged him to continue the COVID-vaccine series including the booster shot.  I have informed him of places where he can go to get the vaccine.  7. Need for vaccination against Streptococcus pneumoniae - Pneumococcal conjugate vaccine 20-valent (Prevnar 20)  8. Need for hepatitis C screening test Patient agreeable to screening. - Hepatitis C Antibody  9. Need for shingles vaccine Follow-up with our clinical pharmacist in 2 weeks to be given the first shot of the shingles vaccine.  He declined getting both the shingles and pneumonia vaccine at the same  time today.  10. Advance directive discussed with patient Martin Majestic over what is an advanced directive with patient and the importance of having 1.  Went over the components that can make up an advanced directive including living will and healthcare power of attorney.  Patient given packet to take home.   Advised that should he execute a living will or healthcare power of attorney, please bring a copy on next visit so we can put it in his chart.  11. Meningioma ([place]) This is small and probably can be observed for now.  However given the recent onset of what sounds like migraine headaches, I will refer him to neurology. - Ambulatory referral to Neurology  12. Migraine with aura and with status migrainosus, not intractable See #11 above. - Ambulatory referral to Neurology  I have personally reviewed and noted the following in the patient's chart:   Medical and social history Use of alcohol, tobacco or illicit drugs  Current medications and supplements Functional ability and status Nutritional status Physical activity Advanced directives List of other physicians Hospitalizations, surgeries, and ER visits in previous 12 months Vitals Screenings to include cognitive, depression, and falls Referrals and appointments  In addition, I have reviewed and discussed with patient certain preventive protocols, quality metrics, and best practice recommendations. A written personalized care plan for preventive services as well as general preventive health recommendations were provided to patient.    Karle Plumber, MD 02/24/2021

## 2021-02-24 ENCOUNTER — Encounter: Payer: Self-pay | Admitting: Internal Medicine

## 2021-02-24 LAB — HEPATITIS C ANTIBODY: Hep C Virus Ab: <0.1 s/co ratio (ref 0.0–0.9)

## 2021-03-09 ENCOUNTER — Telehealth: Payer: Self-pay | Admitting: Internal Medicine

## 2021-03-09 NOTE — Telephone Encounter (Signed)
Pt has decided that he does need an aid to come out and help him.  The one he had took another job, and at that thime she quit, he declined an aid.  But he says he really does believe he needs one. [facility]. They need a need a form [facility] according to pt.

## 2021-03-16 ENCOUNTER — Telehealth: Payer: Self-pay

## 2021-03-16 NOTE — Telephone Encounter (Signed)
Referral for PCS faxed to [facility] 

## 2021-03-18 ENCOUNTER — Ambulatory Visit: Payer: Medicare Other | Admitting: Pharmacist

## 2021-04-01 ENCOUNTER — Telehealth: Payer: Self-pay

## 2021-04-01 NOTE — Telephone Encounter (Signed)
Call placed to [facility], spoke to Monique who confirmed receipt of the PCS referral and said they are ready to schedule an assessment.   

## 2021-04-22 ENCOUNTER — Other Ambulatory Visit: Payer: Self-pay | Admitting: Physical Medicine & Rehabilitation

## 2021-04-22 DIAGNOSIS — S14129S Central cord syndrome at unspecified level of cervical spinal cord, sequela: Secondary | ICD-10-CM

## 2021-04-28 ENCOUNTER — Other Ambulatory Visit: Payer: Self-pay

## 2021-04-28 ENCOUNTER — Encounter: Payer: Self-pay | Admitting: Diagnostic Neuroimaging

## 2021-04-28 ENCOUNTER — Ambulatory Visit (INDEPENDENT_AMBULATORY_CARE_PROVIDER_SITE_OTHER): Payer: Medicare Other | Admitting: Diagnostic Neuroimaging

## 2021-04-28 VITALS — BP 116/72 | HR 93 | Ht 72.0 in | Wt 161.0 lb

## 2021-04-28 DIAGNOSIS — R519 Headache, unspecified: Secondary | ICD-10-CM | POA: Diagnosis not present

## 2021-04-28 NOTE — Progress Notes (Signed)
[facility]  PATIENT: Patrick Romero DOB: [birth year]  REFERRING CLINICIAN: D Johnson HISTORY FROM: patient  REASON FOR VISIT: follow up     HISTORICAL  CHIEF COMPLAINT:  Chief Complaint  Patient presents with   New Patient (Initial Visit)    Rm 7 alone- here for consult on worsening migraines hx of Meningioma. Pt also reports events of blacking out, last event was in august.     HISTORY OF PRESENT ILLNESS:   UPDATE (04/28/2021, VRP): Since last visit, doing here for evaluation of headaches.  Last 6 months patient has had temporal headaches lasting hours at a time with sensitivity to light and nausea.  He has had 4 headaches since that time.  Last headache was in August 2020.  This may have been related to stress factors, alcohol, dehydration.  Patient denies any prior history of migraines.  No symptoms currently.   Also has had some intermittent episodes of low blood pressure and syncopal events.  Last 1 occurred in May 2022 in the setting of alcohol use and exposure to cannabis smoke.   PRIOR HPI: 60 year old male here for evaluation of spinal cord injury loss consciousness.  08/22/2016 patient was at home, felt lightheaded, dizzy, saw some spots and stars, had been dehydrated and passed out at home.  He had some mild shaking and jittery movements.  Patient went to the hospital for evaluation.  Patient felt better and did not want to stay for evaluation.  08/31/2016 patient was found down outside of a restaurant, and was taken to the hospital.  He was found to have low blood pressure, muscle weakness, intoxication with alcohol.  Patient was diagnosed with a cervical spinal cord injury.  He had significant weakness on his right side compared to left side.  He underwent treatment and rehabilitation.  Since that time he is continued to have problems with pain, spasticity and weakness.  He is currently under management of Dr. Primus Bravo (pain management) and his PCP (Dr.  Wynetta Emery).     REVIEW OF SYSTEMS: Full 14 system review of systems performed and negative with exception of: As per HPI.  ALLERGIES: No Known Allergies  HOME MEDICATIONS: Outpatient Medications Prior to Visit  Medication Sig Dispense Refill   albuterol (VENTOLIN HFA) 108 (90 Base) MCG/ACT inhaler Inhale 2 puffs into the lungs every 6 (six) hours as needed for wheezing or shortness of breath. 8 g 2   amitriptyline (ELAVIL) 25 MG tablet Take 1 tablet (25 mg total) by mouth at bedtime. 30 tablet 1   amLODipine (NORVASC) 5 MG tablet Take 1 tablet (5 mg total) by mouth daily. 90 tablet 1   aspirin EC 81 MG tablet Take 1 tablet (81 mg total) by mouth daily. 100 tablet 0   atorvastatin (LIPITOR) 10 MG tablet Take 1 tablet (10 mg total) by mouth daily. 90 tablet 1   baclofen (LIORESAL) 10 MG tablet Take 10 mg by mouth 4 (four) times daily.     BUTRANS 7.5 MCG/HR 1 patch once a week.     gabapentin (NEURONTIN) 300 MG capsule TAKE 3 CAPSULES BY MOUTH 3 TIMES A DAY (Patient taking differently: Take 900 mg by mouth 3 (three) times daily.) 270 capsule 2   NARCAN 4 MG/0.1ML LIQD nasal spray kit Place 1 spray into the nose as needed (overdose). As needed     oxyCODONE-acetaminophen (PERCOCET) 10-325 MG tablet Take 1 tablet by mouth 3 (three) times daily.     tiZANidine (ZANAFLEX) 2 MG tablet  TAKE 1-2 TABLETS (2-4 MG TOTAL) BY MOUTH 4 (FOUR) TIMES DAILY. (TAKE 2 AT BEDTIME) 450 tablet 1   baclofen (LIORESAL) 20 MG tablet TAKE 1 TABLET BY MOUTH FOUR TIMES A DAY (Patient not taking: Reported on 04/28/2021) 360 tablet 1   ferrous sulfate 325 (65 FE) MG tablet Take 1 tablet every Monday, Wednesday, and Friday. (Patient not taking: Reported on 04/28/2021) 100 tablet 0   ketorolac (ACULAR) 0.5 % ophthalmic solution Place 1 drop into the left eye 4 (four) times daily. (Patient not taking: Reported on 04/28/2021)     ofloxacin (OCUFLOX) 0.3 % ophthalmic solution Place 1 drop into the left eye 4 (four) times daily.  (Patient not taking: Reported on 04/28/2021)     oxyCODONE (OXY IR/ROXICODONE) 5 MG immediate release tablet  (Patient not taking: Reported on 04/28/2021)     prednisoLONE acetate (PRED FORTE) 1 % ophthalmic suspension Place 1 drop into the left eye in the morning, at noon, in the evening, and at bedtime. (Patient not taking: Reported on 04/28/2021)     Vitamin D, Ergocalciferol, (DRISDOL) 1.25 MG (50000 UNIT) CAPS capsule Take 50,000 Units by mouth once a week. (Patient not taking: Reported on 04/28/2021)     No facility-administered medications prior to visit.    PAST MEDICAL HISTORY: Past Medical History:  Diagnosis Date   Arthritis    ra   Cancer [facility])    prostate    Cancer of kidney ([place]) 2018   part of right kidney removed   Chronic back pain    lower back burns some too   Chronic neck pain    Depression    Herniated disc, cervical    Hypertension    Sickle cell trait (HCC)    Stroke (HCC)    weakness in right hand and leg , numbness on left     PAST SURGICAL HISTORY: Past Surgical History:  Procedure Laterality Date   CATARACT EXTRACTION Left 12/2020   ROBOT ASSISTED LAPAROSCOPIC RADICAL PROSTATECTOMY N/A 09/13/2017   Procedure: XI ROBOTIC ASSISTED LAPAROSCOPIC RADICAL PROSTATECTOMY, BILATERAL PELVIC LYMPH NODE DISSECTION;  Surgeon: [name], MD;  Location: [facility];  Service: Urology;  Laterality: N/A;   ROBOTIC ASSITED PARTIAL NEPHRECTOMY Right 01/04/2017   Procedure: XI ROBOTIC ASSITED PARTIAL NEPHRECTOMY;  Surgeon: [name], MD;  Location: [facility];  Service: Urology;  Laterality: Right;    FAMILY HISTORY: Family History  Problem Relation Age of Onset   Hypertension Mother    Lung cancer Father    COPD Sister     SOCIAL HISTORY: Social History   Socioeconomic History   Marital status: Single    Spouse name: Not on file   Number of children: Not on file   Years of education: Not on file   Highest education level: Not on file  Occupational  History    Comment: disabled  Tobacco Use   Smoking status: Every Day    Packs/day: 0.25    Types: Cigarettes   Smokeless tobacco: Never   Tobacco comments:    pt is also using nicotine patch  Vaping Use   Vaping Use: Never used  Substance and Sexual Activity   Alcohol use: Yes    Comment: occ beer    Drug use: Yes    Types: Marijuana   Sexual activity: Yes  Other Topics Concern   Not on file  Social History Narrative   Lives with mother, son, sister   Caffeine- none   Right handed   Social Determinants  of Health   Financial Resource Strain: Not on file  Food Insecurity: Not on file  Transportation Needs: Not on file  Physical Activity: Not on file  Stress: Not on file  Social Connections: Not on file  Intimate Partner Violence: Not on file     PHYSICAL EXAM  GENERAL EXAM/CONSTITUTIONAL: Vitals:  Vitals:   04/28/21 1435  BP: 116/72  Pulse: 93  SpO2: 94%  Weight: 161 lb (73 kg)  Height: 6' (1.829 m)   Body mass index is 21.84 kg/m. Wt Readings from Last 3 Encounters:  04/28/21 161 lb (73 kg)  02/23/21 156 lb (70.8 kg)  11/23/20 152 lb 12.5 oz (69.3 kg)   Patient is in no distress; well developed, nourished and groomed; neck is supple  CARDIOVASCULAR: Examination of carotid arteries is normal; no carotid bruits Regular rate and rhythm, no murmurs Examination of peripheral vascular system by observation and palpation is normal  EYES: Ophthalmoscopic exam of optic discs and posterior segments is normal; no papilledema or hemorrhages No results found.  MUSCULOSKELETAL: Gait, strength, tone, movements noted in Neurologic exam below  NEUROLOGIC: MENTAL STATUS:  MMSE - Mini Mental State Exam 02/23/2021  Orientation to time 5  Orientation to Place 5  Registration 3  Attention/ Calculation 5  Recall 3  Language- name 2 objects 2  Language- repeat 1  Language- follow 3 step command 3  Language- read & follow direction 1  Write a sentence 1  Copy  design 1  Total score 30   awake, alert, oriented to person, place and time recent and remote memory intact normal attention and concentration language fluent, comprehension intact, naming intact fund of knowledge appropriate  CRANIAL NERVE:  2nd - no papilledema on fundoscopic exam 2nd, 3rd, 4th, 6th - pupils equal and reactive to light, visual fields full to confrontation, extraocular muscles intact, no nystagmus 5th - facial sensation symmetric 7th - facial strength symmetric 8th - hearing intact 9th - palate elevates symmetrically, uvula midline 11th - shoulder shrug symmetric 12th - tongue protrusion midline  MOTOR:  INCREASED TONE IN RUE AND RLE; RUE 3-4; RLE 4; LEFT SIDE 5  SENSORY:  normal and symmetric to light touch; DECR SENS THROUGHOUT FROM NECK DOWN  COORDINATION:  finger-nose-finger, fine finger movements SLOW ON RIGHT  REFLEXES:  deep tendon reflexes BRISK (BUE AND BLE); POSITIVE HOFFMANS; CLONUS IN BILATERAL ANKLES and symmetric  GAIT/STATION:  RIGHT HEMI PARETIC GAIT; USES CANE      DIAGNOSTIC DATA (LABS, IMAGING, TESTING) - I reviewed patient records, labs, notes, testing and imaging myself where available.  Lab Results  Component Value Date   WBC 7.2 11/23/2020   HGB 9.8 (L) 11/23/2020   HCT 30.8 (L) 11/23/2020   MCV 80.2 11/23/2020   PLT 280 11/23/2020      Component Value Date/Time   NA 136 11/23/2020 0342   NA 138 05/12/2020 1107   K 4.1 11/23/2020 0342   CL 109 11/23/2020 0342   CO2 19 (L) 11/23/2020 0342   GLUCOSE 78 11/23/2020 0342   BUN 11 11/23/2020 0342   BUN 12 05/12/2020 1107   CREATININE 1.04 11/23/2020 0342   CALCIUM 8.7 (L) 11/23/2020 0342   PROT 6.5 11/23/2020 0342   PROT 6.5 11/23/2020 0342   PROT 7.7 05/12/2020 1107   ALBUMIN 3.4 (L) 11/23/2020 0342   ALBUMIN 3.5 11/23/2020 0342   ALBUMIN 4.4 05/12/2020 1107   AST 16 11/23/2020 0342   AST 15 11/23/2020 0342   ALT 14 11/23/2020  0342   ALT 14 11/23/2020 0342    ALKPHOS 49 11/23/2020 0342   ALKPHOS 49 11/23/2020 0342   BILITOT 0.6 11/23/2020 0342   BILITOT 0.4 11/23/2020 0342   BILITOT 0.3 05/12/2020 1107   GFRNONAA >60 11/23/2020 0342   GFRAA 109 05/12/2020 1107   Lab Results  Component Value Date   CHOL 169 01/10/2020   HDL 67 01/10/2020   LDLCALC 61 01/10/2020   TRIG 211 (H) 03/23/2020   CHOLHDL 2.5 01/10/2020   No results found for: HGBA1C Lab Results  Component Value Date   VXBLTJQZ00 923 11/23/2020   Lab Results  Component Value Date   TSH 1.131 11/23/2020     09/01/16 MRI CERVICAL 1. 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 The cord is not expanded, making tumor less likely. The pattern is atypical for a demyelinating process. 2. Anterior corner fractures at C3 and C7 with prevertebral edema and hemorrhage. 3. Posterior element fractures at C7 and C3 are less well appreciated on this study. 4. Multilevel spondylosis of the cervical spine as described. 5. Mild central canal stenosis without cord compromise at the C3-4 level. 6. The most significant central canal stenosis is at C5-6 without focal cord signal abnormality at this level. These results were called by telephone at the time of interpretation on 09/01/2016 at 7:00 am to Dr. Ashok Pall , who verbally acknowledged these results.    ASSESSMENT AND PLAN  60 y.o. year old male here with:  Dx:  1. 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    Penni Bombard, MD 30/01/6225, 3:33 PM Certified in Neurology, Neurophysiology and Neuroimaging  [facility] [address] [place], [place] [address] [phone]

## 2021-04-28 NOTE — Patient Instructions (Signed)
  TEMPORAL HEADACHES (likely migraine) - check ESR, CRP - monitor for now  MENINGIOMA (calcified, stable since 2018; incidental finding) - no routine monitoring needed

## 2021-04-29 ENCOUNTER — Telehealth: Payer: Self-pay

## 2021-04-29 LAB — C-REACTIVE PROTEIN: CRP: 2 mg/L (ref 0–10)

## 2021-04-29 LAB — SEDIMENTATION RATE: Sed Rate: 53 mm/hr — ABNORMAL HIGH (ref 0–30)

## 2021-04-29 NOTE — Telephone Encounter (Signed)
-----  Message from Penni Bombard, MD sent at 04/29/2021  2:44 PM EDT ----- Labs ok. Esr only slightly elevated, so I don't think this is likely temporal arteritis. Monitor for now. -VRP

## 2021-04-29 NOTE — Telephone Encounter (Signed)
I called pt and advised of results. He verbalized understanding and appreciation for the call.

## 2021-05-02 ENCOUNTER — Other Ambulatory Visit: Payer: Self-pay | Admitting: Internal Medicine

## 2021-05-02 DIAGNOSIS — I10 Essential (primary) hypertension: Secondary | ICD-10-CM

## 2021-05-03 NOTE — Telephone Encounter (Signed)
Requested Prescriptions  Pending Prescriptions Disp Refills  . amLODipine (NORVASC) 5 MG tablet [Pharmacy Med Name: AMLODIPINE BESYLATE 5 MG TAB] 90 tablet 0    Sig: TAKE 1 TABLET (5 MG TOTAL) BY MOUTH DAILY.     Cardiovascular:  Calcium Channel Blockers Passed - 05/02/2021 12:55 PM      Passed - Last BP in normal range    BP Readings from Last 1 Encounters:  04/28/21 116/72         Passed - Valid encounter within last 6 months    Recent Outpatient Visits          2 months ago Encounter for [employer] annual wellness exam   [place] Ladell Pier, MD   5 months ago Essential hypertension   [place], MD   10 months ago Essential hypertension   [place], Jarome Matin, RPH-CPP   11 months ago Essential hypertension   [place], [place], NP   1 year ago Anemia, unspecified type   [place], [place]      Future Appointments            In 4 days Ladell Pier, MD [place]

## 2021-05-07 ENCOUNTER — Other Ambulatory Visit: Payer: Self-pay

## 2021-05-07 ENCOUNTER — Ambulatory Visit: Payer: Medicare Other | Attending: Internal Medicine | Admitting: Internal Medicine

## 2021-05-07 ENCOUNTER — Encounter: Payer: Self-pay | Admitting: Internal Medicine

## 2021-05-07 VITALS — BP 153/93 | HR 88 | Resp 16 | Wt 155.2 lb

## 2021-05-07 DIAGNOSIS — Z2821 Immunization not carried out because of patient refusal: Secondary | ICD-10-CM

## 2021-05-07 DIAGNOSIS — D329 Benign neoplasm of meninges, unspecified: Secondary | ICD-10-CM | POA: Diagnosis not present

## 2021-05-07 DIAGNOSIS — G8250 Quadriplegia, unspecified: Secondary | ICD-10-CM

## 2021-05-07 DIAGNOSIS — I10 Essential (primary) hypertension: Secondary | ICD-10-CM

## 2021-05-07 NOTE — Progress Notes (Signed)
Patient ID: Patrick Romero, male    DOB: [birth year]  MRN: [id number]  CC: Hypertension   Subjective: Patrick Romero is a 60 y.o. 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 Tish Frederickson.  No further follow-up needed on the meningioma.  Patient reports he has not had any further headaches.    Incomplete quadriparesis: I referred to P.T but no appt as yet.  He has not had any further falls/.  HTN:  BP elevated on last visit.  He had not taken Norvasc as yet for that day. Reports he takes it daily.  Did not take med as yet this a.m because he was rushing to get to this appointment.  He denies any chest pains or shortness of breath.  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 8 g 2   amitriptyline (ELAVIL) 25 MG  tablet Take 1 tablet (25 mg total) by mouth at bedtime. 30 tablet 1   amLODipine (NORVASC) 5 MG tablet TAKE 1 TABLET (5 MG TOTAL) BY MOUTH DAILY. 90 tablet 0   aspirin EC 81 MG tablet Take 1 tablet (81 mg total) by mouth daily. 100 tablet 0   atorvastatin (LIPITOR) 10 MG tablet Take 1 tablet (10 mg total) by mouth daily. 90 tablet 1   baclofen (LIORESAL) 10 MG tablet Take 10 mg by mouth 4 (four) times daily.     BUTRANS 7.5 MCG/HR 1 patch once a week.     gabapentin (NEURONTIN) 300 MG capsule TAKE 3 CAPSULES BY MOUTH 3 TIMES A DAY (Patient taking differently: Take 900 mg by mouth 3 (three) times daily.) 270 capsule 2   NARCAN 4 MG/0.1ML LIQD nasal spray kit Place 1 spray into the nose as needed (overdose). As needed     oxyCODONE-acetaminophen (PERCOCET) 10-325 MG tablet Take 1 tablet by mouth 3 (three) times daily.     tiZANidine (ZANAFLEX) 2 MG tablet TAKE 1-2 TABLETS (2-4 MG TOTAL) BY MOUTH 4 (FOUR) TIMES DAILY. (TAKE 2 AT BEDTIME) 450 tablet 1   No current facility-administered medications on file prior to visit.    No Known Allergies  Social History   Socioeconomic History   Marital status: Single    Spouse name: Not on file   Number  of children: Not on file   Years of education: Not on file   Highest education level: Not on file  Occupational History    Comment: disabled  Tobacco Use   Smoking status: Every Day    Packs/day: 0.25    Types: Cigarettes   Smokeless tobacco: Never   Tobacco comments:    pt is also using nicotine patch  Vaping Use   Vaping Use: Never used  Substance and Sexual Activity   Alcohol use: Yes    Comment: occ beer    Drug use: Yes    Types: Marijuana   Sexual activity: Yes  Other Topics Concern   Not on file  Social History Narrative   Lives with mother, son, sister   Caffeine- none   Right handed   Social Determinants of Health   Financial Resource Strain: Not on file  Food Insecurity: Not on file  Transportation Needs: Not on file   Physical Activity: Not on file  Stress: Not on file  Social Connections: Not on file  Intimate Partner Violence: Not on file    Family History  Problem Relation Age of Onset   Hypertension Mother    Lung cancer Father    COPD Sister     Past Surgical History:  Procedure Laterality Date   CATARACT EXTRACTION Left 12/2020   ROBOT ASSISTED LAPAROSCOPIC RADICAL PROSTATECTOMY N/A 09/13/2017   Procedure: XI ROBOTIC ASSISTED LAPAROSCOPIC RADICAL PROSTATECTOMY, BILATERAL PELVIC LYMPH NODE DISSECTION;  Surgeon: [name], MD;  Location[facility];  Service: Urology;  Laterality: N/A;   ROBOTIC ASSITED PARTIAL NEPHRECTOMY Right 01/04/2017   Procedure: XI ROBOTIC ASSITED PARTIAL NEPHRECTOMY;  Surgeon: [name], MD;  Location: [facility];  Service: Urology;  Laterality: Right;    ROS: Review of Systems Negative except as stated above  PHYSICAL EXAM: BP (!) 153/93   Pulse 88   Resp 16   Wt 155 lb 3.2 oz (70.4 kg)   SpO2 99%   BMI 21.05 kg/m   Physical Exam  General appearance - alert, well appearing, older African-American male and in no distress.  He has a cane with him. Mental status - normal mood, behavior, speech, dress, motor activity, and thought processes Chest - clear to auscultation, no wheezes, rales or rhonchi, symmetric air entry Heart - normal rate, regular rhythm, normal S1, S2, no murmurs, rubs, clicks or gallops Extremities - peripheral pulses normal, no pedal edema, no clubbing or cyanosis   CMP Latest Ref Rng & Units 11/23/2020 11/23/2020 11/22/2020  Glucose 70 - 99 mg/dL - 78 110(H)  BUN 6 - 20 mg/dL - 11 14  Creatinine 0.61 - 1.24 mg/dL - 1.04 1.42(H)  Sodium 135 - 145 mmol/L - 136 135  Potassium 3.5 - 5.1 mmol/L - 4.1 3.6  Chloride 98 - 111 mmol/L - 109 104  CO2 22 - 32 mmol/L - 19(L) 21(L)  Calcium 8.9 - 10.3 mg/dL - 8.7(L) 8.8(L)  Total Protein 6.5 - 8.1 g/dL 6.5 6.5 -  Total Bilirubin 0.3 - 1.2 mg/dL 0.6 0.4 -  Alkaline Phos 38 - 126 U/L 49 49 -   AST 15 - 41 U/L 16 15 -  ALT 0 - 44 U/L 14 14 -   Lipid Panel     Component Value Date/Time   CHOL 169 01/10/2020 1659   TRIG 211 (H) 03/23/2020 1255   HDL 67 01/10/2020 1659   CHOLHDL 2.5 01/10/2020 1659   LDLCALC 61 01/10/2020 1659    CBC  Component Value Date/Time   WBC 7.2 11/23/2020 0342   RBC 3.87 (L) 11/23/2020 0342   RBC 3.84 (L) 11/23/2020 0342   HGB 9.8 (L) 11/23/2020 0342   HGB 11.1 (L) 05/12/2020 1107   HCT 30.8 (L) 11/23/2020 0342   HCT 34.1 (L) 05/12/2020 1107   PLT 280 11/23/2020 0342   PLT 450 05/12/2020 1107   MCV 80.2 11/23/2020 0342   MCV 78 (L) 05/12/2020 1107   MCH 25.5 (L) 11/23/2020 0342   MCHC 31.8 11/23/2020 0342   RDW 13.1 11/23/2020 0342   RDW 12.7 05/12/2020 1107   LYMPHSABS 3.1 11/23/2020 0342   LYMPHSABS 3.0 02/17/2017 1427   MONOABS 0.6 11/23/2020 0342   EOSABS 0.1 11/23/2020 0342   EOSABS 0.3 02/17/2017 1427   BASOSABS 0.0 11/23/2020 0342   BASOSABS 0.0 02/17/2017 1427    ASSESSMENT AND PLAN: 1. Essential hypertension Not at goal.  He has not taken amlodipine as yet for today.  He will take it as soon as he returns home.  Advised him to check blood pressure at least twice a week with goal being 130/80 or lower.  2. Quadriparesis [facility]) I will have our referral coordinator check into the physical therapy referral that was submitted on last visit.  3. Meningioma ([place]) He has seen the neurologist.  This is stable.  4. Influenza vaccination declined    Patient was given the opportunity to ask questions.  Patient verbalized understanding of the plan and was able to repeat key elements of the plan.   No orders of the defined types were placed in this encounter.    Requested Prescriptions    No prescriptions requested or ordered in this encounter    No follow-ups on file.  Karle Plumber, MD, FACP

## 2021-06-25 ENCOUNTER — Ambulatory Visit: Payer: Medicare Other | Admitting: Internal Medicine

## 2021-07-08 ENCOUNTER — Emergency Department (HOSPITAL_COMMUNITY): Payer: Medicare Other

## 2021-07-08 ENCOUNTER — Other Ambulatory Visit: Payer: Self-pay

## 2021-07-08 ENCOUNTER — Emergency Department (HOSPITAL_COMMUNITY)
Admission: EM | Admit: 2021-07-08 | Discharge: 2021-07-08 | Disposition: A | Discharge status: Home / Self Care | Payer: Medicare Other | Attending: Emergency Medicine | Admitting: Emergency Medicine

## 2021-07-08 ENCOUNTER — Encounter (HOSPITAL_COMMUNITY): Payer: Self-pay | Admitting: Emergency Medicine

## 2021-07-08 DIAGNOSIS — I10 Essential (primary) hypertension: Secondary | ICD-10-CM | POA: Diagnosis not present

## 2021-07-08 DIAGNOSIS — Z8616 Personal history of COVID-19: Secondary | ICD-10-CM | POA: Insufficient documentation

## 2021-07-08 DIAGNOSIS — Z79899 Other long term (current) drug therapy: Secondary | ICD-10-CM | POA: Insufficient documentation

## 2021-07-08 DIAGNOSIS — F17210 Nicotine dependence, cigarettes, uncomplicated: Secondary | ICD-10-CM | POA: Diagnosis not present

## 2021-07-08 DIAGNOSIS — S0990XA Unspecified injury of head, initial encounter: Secondary | ICD-10-CM | POA: Diagnosis not present

## 2021-07-08 DIAGNOSIS — Z85528 Personal history of other malignant neoplasm of kidney: Secondary | ICD-10-CM | POA: Diagnosis not present

## 2021-07-08 DIAGNOSIS — R55 Syncope and collapse: Secondary | ICD-10-CM | POA: Diagnosis not present

## 2021-07-08 DIAGNOSIS — Z8546 Personal history of malignant neoplasm of prostate: Secondary | ICD-10-CM | POA: Insufficient documentation

## 2021-07-08 DIAGNOSIS — Z7982 Long term (current) use of aspirin: Secondary | ICD-10-CM | POA: Diagnosis not present

## 2021-07-08 DIAGNOSIS — S098XXA Other specified injuries of head, initial encounter: Secondary | ICD-10-CM

## 2021-07-08 DIAGNOSIS — X58XXXA Exposure to other specified factors, initial encounter: Secondary | ICD-10-CM | POA: Diagnosis not present

## 2021-07-08 DIAGNOSIS — Z20822 Contact with and (suspected) exposure to covid-19: Secondary | ICD-10-CM | POA: Insufficient documentation

## 2021-07-08 LAB — CBC WITH DIFFERENTIAL/PLATELET
Abs Immature Granulocytes: 0.02 10*3/uL (ref 0.00–0.07)
Basophils Absolute: 0 10*3/uL (ref 0.0–0.1)
Basophils Relative: 0 %
Eosinophils Absolute: 0.1 10*3/uL (ref 0.0–0.5)
Eosinophils Relative: 1 %
HCT: 32.7 % — ABNORMAL LOW (ref 39.0–52.0)
Hemoglobin: 9.9 g/dL — ABNORMAL LOW (ref 13.0–17.0)
Immature Granulocytes: 0 %
Lymphocytes Relative: 35 %
Lymphs Abs: 2.7 10*3/uL (ref 0.7–4.0)
MCH: 25.4 pg — ABNORMAL LOW (ref 26.0–34.0)
MCHC: 30.3 g/dL (ref 30.0–36.0)
MCV: 84.1 fL (ref 80.0–100.0)
Monocytes Absolute: 0.6 10*3/uL (ref 0.1–1.0)
Monocytes Relative: 7 %
Neutro Abs: 4.3 10*3/uL (ref 1.7–7.7)
Neutrophils Relative %: 57 %
Platelets: 313 10*3/uL (ref 150–400)
RBC: 3.89 MIL/uL — ABNORMAL LOW (ref 4.22–5.81)
RDW: 13.6 % (ref 11.5–15.5)
WBC: 7.7 10*3/uL (ref 4.0–10.5)
nRBC: 0 % (ref 0.0–0.2)

## 2021-07-08 LAB — BASIC METABOLIC PANEL
Anion gap: 8 (ref 5–15)
BUN: 14 mg/dL (ref 8–23)
CO2: 22 mmol/L (ref 22–32)
Calcium: 8.9 mg/dL (ref 8.9–10.3)
Chloride: 106 mmol/L (ref 98–111)
Creatinine, Ser: 1.14 mg/dL (ref 0.61–1.24)
GFR, Estimated: >60 mL/min (ref >60)
Glucose, Bld: 104 mg/dL — ABNORMAL HIGH (ref 70–99)
Potassium: 4.3 mmol/L (ref 3.5–5.1)
Sodium: 136 mmol/L (ref 135–145)

## 2021-07-08 LAB — CBG MONITORING, ED: Glucose-Capillary: 103 mg/dL — ABNORMAL HIGH (ref 70–99)

## 2021-07-08 LAB — RESP PANEL BY RT-PCR (FLU A&B, COVID) ARPGX2
Influenza A by PCR: NEGATIVE
Influenza B by PCR: NEGATIVE
SARS Coronavirus 2 by RT PCR: NEGATIVE

## 2021-07-08 LAB — TROPONIN I (HIGH SENSITIVITY)
Troponin I (High Sensitivity): 5 ng/L (ref <18)
Troponin I (High Sensitivity): 4 ng/L (ref <18)

## 2021-07-08 LAB — ETHANOL: Alcohol, Ethyl (B): 169 mg/dL — ABNORMAL HIGH (ref <10)

## 2021-07-08 MED ORDER — SODIUM CHLORIDE 0.9 % IV BOLUS
1000.0000 mL | Freq: Once | INTRAVENOUS | Status: AC
  Administered 2021-07-08: 10:00:00 1000 mL via INTRAVENOUS

## 2021-07-08 MED ORDER — ACETAMINOPHEN 325 MG PO TABS
650.0000 mg | ORAL_TABLET | Freq: Once | ORAL | Status: AC
  Administered 2021-07-08: 13:00:00 650 mg via ORAL
  Filled 2021-07-08: qty 2

## 2021-07-08 NOTE — ED Triage Notes (Signed)
Patient arrived with EMS reports syncopal episode this morning , patient's friend reported to EMS that he has been drinking ETOH , alert but slow to respond at triage , respirations unlabored/denies pain , patient stated " I feel tired" , hypotensive at triage .

## 2021-07-08 NOTE — ED Provider Notes (Signed)
Emergency Medicine Provider Triage Evaluation Note  Patrick Romero , a 60 y.o. male  was evaluated in triage.  Pt complains of syncope.  Per EMS, he had been drinking with his friends and passed out.  Patient reports feeling fatigued and SOB.  He doesn't recall what happened earlier.  He denies being in any pain.  Review of Systems  Positive: Fatigue, SOB Negative: Fever, chills  Physical Exam  BP (!) 88/68 (BP Location: Right Arm)    Pulse 74    Temp (!) 97.5 F (36.4 C) (Oral)    Resp 12    SpO2 98%  Gen:   Awake, no distress   Resp:  Normal effort  MSK:   Moves extremities without difficulty  Other:    Medical Decision Making  Medically screening exam initiated at 4:52 AM.  Appropriate orders placed.  RANE BLITCH was informed that the remainder of the evaluation will be completed by another provider, this initial triage assessment does not replace that evaluation, and the importance of remaining in the ED until their evaluation is complete.  Syncope BP is soft, fluids ordered, doesn't appear toxic   Montine Circle, PA-C 07/08/21 0453    Long, [facility], MD 07/08/21 [phone]

## 2021-07-08 NOTE — ED Provider Notes (Signed)
[facility] EMERGENCY DEPARTMENT Provider Note   CSN: [id number] Arrival date & time: 07/08/21  0446     History Chief Complaint  Patient presents with   Syncope / ETOH     Patrick Romero is a 60 y.o. male.  Patient is a 60 yo male presenting for syncope. Pt states he was drinking more than normal last night. States he had hard liquor and usual does not. States around 9-10 pm he felt nausea, began sweating, had tunnel vision, and passed out. States it was witnessed by friend. Unknown down time. Denies chest pain or sob. Denies repeat syncope in ED during today's visit. States he has hx of previous loc and has seen neurology and cardiology with no clear dx.   The history is provided by the patient. No language interpreter was used.      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   Spastic hemiparesis of right dominant side ([place]) 06/12/2019   Spasticity 03/27/2019   Depression 05/29/2018   Prostate cancer ([place]) 09/13/2017   Hypertension 02/17/2017   Atherosclerosis of aorta ([place]) 11/23/2016   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   Dysesthesia    History of syncope    Tetraparesis (HCC)    Central cord syndrome (HCC)    Anemia    ETOH abuse    Neuropathic pain    Neck pain    Chronic neck and back pain 09/01/2016   Tobacco abuse 09/01/2016    Past Surgical History:  Procedure Laterality Date   CATARACT EXTRACTION Left 12/2020   ROBOT ASSISTED LAPAROSCOPIC RADICAL  PROSTATECTOMY N/A 09/13/2017   Procedure: XI ROBOTIC [place], BILATERAL PELVIC LYMPH NODE DISSECTION;  Romero: [name], MD;  Location: [facility];  Service: Urology;  Laterality: N/A;   ROBOTIC ASSITED PARTIAL NEPHRECTOMY Right 01/04/2017   Procedure: XI ROBOTIC ASSITED PARTIAL NEPHRECTOMY;  Romero: [name], MD;  Location: [facility];  Service: Urology;  Laterality: Right;       Family History  Problem Relation Age of Onset   Hypertension Mother    Lung cancer Father    COPD Sister     Social History   Tobacco Use   Smoking status: Every Day    Packs/day: 0.25    Types: Cigarettes   Smokeless tobacco: Never   Tobacco comments:    pt is also using nicotine patch  Vaping Use   Vaping Use: Never used  Substance Use Topics   Alcohol use: Yes   Drug use: Yes    Types: Marijuana    Home Medications Prior to Admission medications   Medication Sig Start Date End Date Taking? Authorizing Provider  albuterol (VENTOLIN HFA) 108 (90 Base) MCG/ACT inhaler Inhale 2 puffs into the lungs every 6 (six) hours as needed for wheezing or shortness of breath.  04/23/20  Yes Freeman Caldron M, PA-C  amitriptyline (ELAVIL) 25 MG tablet Take 1 tablet (25 mg total) by mouth at bedtime. 07/12/18  Yes Meredith Staggers, MD  amLODipine (NORVASC) 5 MG tablet TAKE 1 TABLET (5 MG TOTAL) BY MOUTH DAILY. Patient taking differently: Take 5 mg by mouth daily. 05/03/21  Yes Ladell Pier, MD  aspirin EC 81 MG tablet Take 1 tablet (81 mg total) by mouth daily. 10/01/19  Yes Ladell Pier, MD  atorvastatin (LIPITOR) 10 MG tablet Take 1 tablet (10 mg total) by mouth daily. 11/10/20  Yes Ladell Pier, MD  baclofen (LIORESAL) 10 MG tablet Take 10 mg by mouth 4 (four) times daily. 02/09/21  Yes [provider]  gabapentin (NEURONTIN) 300 MG capsule TAKE 3 CAPSULES BY MOUTH 3 TIMES A DAY Patient taking differently: Take 900 mg by mouth 3 (three) times  daily. 04/28/20  Yes Meredith Staggers, MD  NARCAN 4 MG/0.1ML LIQD nasal spray kit Place 1 spray into the nose as needed (overdose). As needed 09/17/18  Yes [provider]  oxyCODONE-acetaminophen (PERCOCET) 10-325 MG tablet Take 1 tablet by mouth 3 (three) times daily. 10/06/20  Yes [provider]  tiZANidine (ZANAFLEX) 2 MG tablet TAKE 1-2 TABLETS (2-4 MG TOTAL) BY MOUTH 4 (FOUR) TIMES DAILY. (TAKE 2 AT BEDTIME) Patient taking differently: Take 2-4 mg by mouth 4 (four) times daily. 10/12/20  Yes Meredith Staggers, MD    Allergies    Patient has no known allergies.  Review of Systems   Review of Systems  Constitutional:  Negative for chills and fever.  HENT:  Negative for ear pain and sore throat.   Eyes:  Negative for pain and visual disturbance.  Respiratory:  Negative for cough and shortness of breath.   Cardiovascular:  Negative for chest pain and palpitations.  Gastrointestinal:  Negative for abdominal pain and vomiting.  Genitourinary:  Negative for dysuria and hematuria.  Musculoskeletal:  Negative for arthralgias and back pain.  Skin:  Negative for color change and rash.  Neurological:  Positive for syncope. Negative for seizures.  All other systems reviewed and are negative.  Physical Exam Updated Vital Signs BP (!) 144/90    Pulse 88    Temp 98.1 F (36.7 C)    Resp 20    SpO2 98%   Physical Exam Vitals and nursing note reviewed.  Constitutional:      General: He is not in acute distress.    Appearance: He is well-developed.  HENT:     Head: Normocephalic and atraumatic.  Eyes:     Conjunctiva/sclera: Conjunctivae normal.  Cardiovascular:     Rate and Rhythm: Normal rate and regular rhythm.     Heart sounds: No murmur heard. Pulmonary:     Effort: Pulmonary effort is normal. No respiratory distress.     Breath sounds: Normal breath sounds.  Abdominal:     Palpations: Abdomen is soft.     Tenderness: There is no abdominal tenderness.   Musculoskeletal:        General: No swelling.     Cervical back: Neck supple.  Skin:    General: Skin is warm and dry.     Capillary Refill: Capillary refill takes less than 2 seconds.  Neurological:     Mental Status: He is alert and oriented to person, place, and time.     GCS: GCS eye subscore is 4. GCS verbal subscore is 5. GCS motor subscore is 6.  Cranial Nerves: Cranial nerves 2-12 are intact.     Coordination: Coordination is intact.     Comments: No sensation deficits,. Right sided upper and lower ext weakness-pt states is baseline from previous stroke.   Psychiatric:        Mood and Affect: Mood normal.    ED Results / Procedures / Treatments   Labs (all labs ordered are listed, but only abnormal results are displayed) Labs Reviewed  BASIC METABOLIC PANEL - Abnormal; Notable for the following components:      Result Value   Glucose, Bld 104 (*)    All other components within normal limits  CBC WITH DIFFERENTIAL/PLATELET - Abnormal; Notable for the following components:   RBC 3.89 (*)    Hemoglobin 9.9 (*)    HCT 32.7 (*)    MCH 25.4 (*)    All other components within normal limits  ETHANOL - Abnormal; Notable for the following components:   Alcohol, Ethyl (B) 169 (*)    All other components within normal limits  CBG MONITORING, ED - Abnormal; Notable for the following components:   Glucose-Capillary 103 (*)    All other components within normal limits  RESP PANEL BY RT-PCR (FLU A&B, COVID) [id number]  TROPONIN I (HIGH SENSITIVITY)  TROPONIN I (HIGH SENSITIVITY)    EKG EKG Interpretation  Date/Time:  Thursday July 08 2021 04:55:41 EST Ventricular Rate:  77 PR Interval:  188 QRS Duration: 82 QT Interval:  410 QTC Calculation: 463 R Axis:   14 Text Interpretation: Normal sinus rhythm Normal ECG When compared with ECG of 22-Nov-2020 20:31, PREVIOUS ECG IS PRESENT Confirmed by Merrily Pew [phone]) on 07/08/2021 4:59:24 AM  Radiology DG Chest 2  View  Result Date: 07/08/2021 CLINICAL DATA:  Syncope and fatigue EXAM: CHEST - 2 VIEW COMPARISON:  11/22/2020 FINDINGS: Large lung volumes. There is no edema, consolidation, effusion, or pneumothorax. Normal heart size. Mild aortic tortuosity. IMPRESSION: No active cardiopulmonary disease. Electronically Signed   By: Jorje Guild M.D.   On: 07/08/2021 05:24   CT Head Wo Contrast  Result Date: 07/08/2021 CLINICAL DATA:  Ataxia, head injury after fall. EXAM: CT HEAD WITHOUT CONTRAST TECHNIQUE: Contiguous axial images were obtained from the base of the skull through the vertex without intravenous contrast. COMPARISON:  August 31, 2016.  Nov 23, 2020. FINDINGS: Brain: Stable small probable meningioma seen arising from right tentorium. No mass effect or midline shift is noted. Ventricular size is within normal limits. There is no evidence of acute infarction or hemorrhage. Vascular: No hyperdense vessel or unexpected calcification. Skull: Normal. Negative for fracture or focal lesion. Sinuses/Orbits: No acute finding. Other: None. IMPRESSION: Stable small meningioma arising from right tentorium. No acute intracranial abnormality seen. Electronically Signed   By: Marijo Conception M.D.   On: 07/08/2021 12:53    Procedures Procedures   Medications Ordered in ED Medications  sodium chloride 0.9 % bolus 1,000 mL (0 mLs Intravenous Stopped 07/08/21 1055)  acetaminophen (TYLENOL) tablet 650 mg (650 mg Oral Given 07/08/21 1236)    ED Course  I have reviewed the triage vital signs and the nursing notes.  Pertinent labs & imaging results that were available during my care of the patient were reviewed by me and considered in my medical decision making (see chart for details).    MDM Rules/Calculators/A&P                         Patient is a 60 yo  male presenting for syncope while intoxicated. Pt is Aox3, no acute distress, afebrile, with stable vitals. Physical exam demonstrates no evidence of  trauma. No neurological deficits. CT head and neck demonstrates no acute process.   Syncope: -Ethanol level elevated.  -No hypoglycemia -ECG stable with no ST segment elevation or depression. Troponin and CXR normal. -Prodromal symptoms present. Likely vasovagal syncope.  -No neuro deficits. Pt intoxicated at time of event. Doubt stroke.  Patient in no distress and overall condition improved here in the ED. Detailed discussions were had with the patient regarding current findings, and need for close f/u with PCP or on call doctor. The patient has been instructed to return immediately if the symptoms worsen in any way for re-evaluation. Patient verbalized understanding and is in agreement with current care plan. All questions answered prior to discharge.    Final Clinical Impression(s) / ED Diagnoses Final diagnoses:  Vasovagal syncope  Blunt head trauma, initial encounter    Rx / DC Orders ED Discharge Orders     None        Lianne Cure, DO 32/12/24 2132

## 2021-07-08 NOTE — ED Notes (Signed)
Patient transported to CT 

## 2021-07-08 NOTE — Discharge Instructions (Signed)
Please discontinue alcohol use as this time due to repeat falls and risk for injury

## 2021-07-19 ENCOUNTER — Other Ambulatory Visit: Payer: Self-pay | Admitting: Internal Medicine

## 2021-07-19 ENCOUNTER — Other Ambulatory Visit: Payer: Self-pay | Admitting: Physical Medicine & Rehabilitation

## 2021-07-19 DIAGNOSIS — S14129S Central cord syndrome at unspecified level of cervical spinal cord, sequela: Secondary | ICD-10-CM

## 2021-07-19 DIAGNOSIS — E7800 Pure hypercholesterolemia, unspecified: Secondary | ICD-10-CM

## 2021-07-19 NOTE — Telephone Encounter (Signed)
Requested medication (s) are due for refill today: no, filled for 90 days on 05/11/21  Requested medication (s) are on the active medication list: yes  Last refill:  05/11/21  Future visit scheduled: 10/05/21  Notes to clinic:  Failed protocol of labs within 360 days, (labs from 01/2020)  has upcoming appt, requesting early, please assess.   Requested Prescriptions  Pending Prescriptions Disp Refills   atorvastatin (LIPITOR) 10 MG tablet [Pharmacy Med Name: ATORVASTATIN 10 MG TABLET] 90 tablet 1    Sig: TAKE 1 TABLET BY MOUTH EVERY DAY     Cardiovascular:  Antilipid - Statins Failed - 07/19/2021  2:37 PM      Failed - Total Cholesterol in normal range and within 360 days    Cholesterol, Total  Date Value Ref Range Status  01/10/2020 169 100 - 199 mg/dL Final          Failed - LDL in normal range and within 360 days    LDL Chol Calc (NIH)  Date Value Ref Range Status  01/10/2020 61 0 - 99 mg/dL Final          Failed - HDL in normal range and within 360 days    HDL  Date Value Ref Range Status  01/10/2020 67 >39 mg/dL Final          Failed - Triglycerides in normal range and within 360 days    Triglycerides  Date Value Ref Range Status  03/23/2020 211 (H) <150 mg/dL Final    Comment:    Performed at [facility], [place] [address] [place], [place] [address]          Passed - Patient is not pregnant      Passed - Valid encounter within last 12 months    Recent Outpatient Visits           2 months ago Essential hypertension   [place], MD   4 months ago Encounter for [employer] annual wellness exam   [place] Ladell Pier, MD   8 months ago Essential hypertension   [place], Deborah B, MD   1 year ago Essential hypertension   [place], [place], RPH-CPP   1 year ago Essential hypertension    [place], Amy J, NP       Future Appointments             In 2 months Ladell Pier, MD [place]

## 2021-07-28 ENCOUNTER — Telehealth: Payer: Self-pay | Admitting: Internal Medicine

## 2021-07-28 NOTE — Telephone Encounter (Signed)
Pt wants Dr Wynetta Emery to know he has ordered knee, ankle, back and wrist braces from a DME co. He said they advised him to let his dr know.

## 2021-07-28 NOTE — Telephone Encounter (Signed)
Will make provider aware

## 2021-07-28 NOTE — Telephone Encounter (Signed)
Will route to Dr. Durenda Age CMA.

## 2021-08-05 NOTE — Telephone Encounter (Addendum)
Alma from DME called to check on the status of the prior authorization forms. Dedra Skeens will fax again today. She stated that she faxed them this week on Tuesday and Wednesday.    Fax - [phone]

## 2021-08-06 NOTE — Telephone Encounter (Signed)
Contacted pt to schedule an appt pt states he will wait till March cause he already has an appt schedule

## 2021-09-02 ENCOUNTER — Other Ambulatory Visit: Payer: Self-pay | Admitting: Family Medicine

## 2021-09-02 DIAGNOSIS — Z122 Encounter for screening for malignant neoplasm of respiratory organs: Secondary | ICD-10-CM

## 2021-09-02 DIAGNOSIS — F17210 Nicotine dependence, cigarettes, uncomplicated: Secondary | ICD-10-CM

## 2021-09-09 ENCOUNTER — Other Ambulatory Visit: Payer: Self-pay | Admitting: Internal Medicine

## 2021-09-09 DIAGNOSIS — I10 Essential (primary) hypertension: Secondary | ICD-10-CM

## 2021-09-09 NOTE — Telephone Encounter (Signed)
Requested Prescriptions  ?Pending Prescriptions Disp Refills  ?? amLODipine (NORVASC) 5 MG tablet [Pharmacy Med Name: AMLODIPINE BESYLATE 5 MG TAB] 90 tablet 0  ?  Sig: TAKE 1 TABLET (5 MG TOTAL) BY MOUTH DAILY.  ?  ? Cardiovascular: Calcium Channel Blockers 2 Failed - 09/09/2021 12:08 AM  ?  ?  Failed - Last BP in normal range  ?  BP Readings from Last 1 Encounters:  ?07/08/21 (!) 144/90  ?   ?  ?  Passed - Last Heart Rate in normal range  ?  Pulse Readings from Last 1 Encounters:  ?07/08/21 88  ?   ?  ?  Passed - Valid encounter within last 6 months  ?  Recent Outpatient Visits   ?      ? 4 months ago Essential hypertension  ? [place] Ladell Pier, MD  ? 6 months ago Encounter for [employer] annual wellness exam  ? [place] Ladell Pier, MD  ? 10 months ago Essential hypertension  ? [place] Ladell Pier, MD  ? 1 year ago Essential hypertension  ? [place], RPH-CPP  ? 1 year ago Essential hypertension  ? [place], [place], NP  ?  ?  ?Future Appointments   ?        ? In 3 weeks Ladell Pier, MD [place]  ?  ? ?  ?  ?  ? ?

## 2021-09-24 ENCOUNTER — Other Ambulatory Visit: Payer: Self-pay | Admitting: Internal Medicine

## 2021-09-24 DIAGNOSIS — I10 Essential (primary) hypertension: Secondary | ICD-10-CM

## 2021-09-24 NOTE — Telephone Encounter (Signed)
Requested medication (s) are due for refill today: No ? ?Requested medication (s) are on the active medication list: Yes ? ?Last refill:  09/09/21 ? ?Future visit scheduled: Yes ? ?Notes to clinic:  Pharmacy requesting 90 day supply. ? ? ? ?Requested Prescriptions  ?Pending Prescriptions Disp Refills  ? amLODipine (NORVASC) 5 MG tablet [Pharmacy Med Name: AMLODIPINE BESYLATE 5 MG TAB] 90 tablet 1  ?  Sig: TAKE 1 TABLET (5 MG TOTAL) BY MOUTH DAILY.  ?  ? Cardiovascular: Calcium Channel Blockers 2 Failed - 09/24/2021  7:13 AM  ?  ?  Failed - Last BP in normal range  ?  BP Readings from Last 1 Encounters:  ?07/08/21 (!) 144/90  ?  ?  ?  ?  Passed - Last Heart Rate in normal range  ?  Pulse Readings from Last 1 Encounters:  ?07/08/21 88  ?  ?  ?  ?  Passed - Valid encounter within last 6 months  ?  Recent Outpatient Visits   ? ?      ? 4 months ago Essential hypertension  ? [place] Ladell Pier, MD  ? 7 months ago Encounter for [employer] annual wellness exam  ? [place] Ladell Pier, MD  ? 10 months ago Essential hypertension  ? [place] Ladell Pier, MD  ? 1 year ago Essential hypertension  ? [place], RPH-CPP  ? 1 year ago Essential hypertension  ? [place], [place], NP  ? ?  ?  ?Future Appointments   ? ?        ? In 1 week Ladell Pier, MD [place]  ? ?  ? ?  ?  ?  ? ?

## 2021-09-30 ENCOUNTER — Ambulatory Visit: Payer: Medicare Other

## 2021-10-05 ENCOUNTER — Ambulatory Visit: Payer: Medicare Other | Admitting: Internal Medicine

## 2021-10-15 ENCOUNTER — Ambulatory Visit
Admission: RE | Admit: 2021-10-15 | Discharge: 2021-10-15 | Disposition: A | Discharge status: Home / Self Care | Payer: Medicare HMO | Source: Ambulatory Visit | Attending: Family Medicine | Admitting: Family Medicine

## 2021-10-15 DIAGNOSIS — F17210 Nicotine dependence, cigarettes, uncomplicated: Secondary | ICD-10-CM

## 2021-10-15 DIAGNOSIS — Z122 Encounter for screening for malignant neoplasm of respiratory organs: Secondary | ICD-10-CM

## 2022-01-07 ENCOUNTER — Ambulatory Visit: Payer: Medicare Other | Admitting: Internal Medicine

## 2022-03-08 ENCOUNTER — Other Ambulatory Visit: Payer: Self-pay | Admitting: Internal Medicine

## 2022-03-08 DIAGNOSIS — I10 Essential (primary) hypertension: Secondary | ICD-10-CM

## 2022-03-08 DIAGNOSIS — E7800 Pure hypercholesterolemia, unspecified: Secondary | ICD-10-CM

## 2022-03-08 NOTE — Telephone Encounter (Signed)
Medication Refill - Medication: amLODipine (NORVASC) 5 MG tablet   atorvastatin (LIPITOR) 10 MG tablet  / [facility] to [facility] but after this e has enrolled in [facility] mail order pharmacy for further refills / office may received request soon  Has the patient contacted their pharmacy? Yes.   (Agent: If no, request that the patient contact the pharmacy for the refill. If patient does not wish to contact the pharmacy document the reason why and proceed with request.) (Agent: If yes, when and what did the pharmacy advise?)  Preferred Pharmacy (with phone number or street name): [id number] [place] [address], [id number] Phone:  [phone] Fax:  [phone] DEA #:  [id number] the patient been seen for an appointment in the last year OR does the patient have an upcoming appointment? Yes.    Agent: Please be advised that RX refills may take up to 3 business days. We ask that you follow-up with your pharmacy.

## 2022-03-08 NOTE — Telephone Encounter (Signed)
Requested medication (s) are due for refill today: yes  Requested medication (s) are on the active medication list: yes  Last refill:  Amlodipine 09/24/21 #30 with 0 RF, Atorvastatin 07/21/21 #90 with 0 RF  Future visit scheduled: yes, 04/12/22, last seen 05/07/21  Notes to clinic:  Pt has had curtesy refills, has canceled or NO SHOW last few appts, labs are out of date for Atovastatin, please assess.      Requested Prescriptions  Pending Prescriptions Disp Refills   amLODipine (NORVASC) 5 MG tablet 30 tablet 0    Sig: Take by mouth daily.     Cardiovascular: Calcium Channel Blockers 2 Failed - 03/08/2022  2:34 PM      Failed - Last BP in normal range    BP Readings from Last 1 Encounters:  07/08/21 (!) 144/90         Failed - Valid encounter within last 6 months    Recent Outpatient Visits           10 months ago Essential hypertension   [place], Deborah B, MD   1 year ago Encounter for Medicare annual wellness exam   [place], Deborah B, MD   1 year ago Essential hypertension   [place], Deborah B, MD   1 year ago Essential hypertension   [place], RPH-CPP   1 year ago Essential hypertension   [place], Amy J, NP       Future Appointments             In 1 month Ladell Pier, MD [place] - Last Heart Rate in normal range    Pulse Readings from Last 1 Encounters:  07/08/21 88          atorvastatin (LIPITOR) 10 MG tablet 90 tablet 0    Sig: Take 1 tablet (10 mg total) by mouth daily.     Cardiovascular:  Antilipid - Statins Failed - 03/08/2022  2:34 PM      Failed - Lipid Panel in normal range within the last 12 months    Cholesterol, Total  Date Value Ref Range Status  01/10/2020 169  100 - 199 mg/dL Final   LDL Chol Calc (NIH)  Date Value Ref Range Status  01/10/2020 61 0 - 99 mg/dL Final   HDL  Date Value Ref Range Status  01/10/2020 67 >39 mg/dL Final   Triglycerides  Date Value Ref Range Status  03/23/2020 211 (H) <150 mg/dL Final    Comment:    Performed at [facility], [place] [address], [place], [place] [address]         Passed - Patient is not pregnant      Passed - Valid encounter within last 12 months    Recent Outpatient Visits           10 months ago Essential hypertension   [place] Ladell Pier, MD   1 year ago Encounter for [employer] annual wellness exam   [place] Ladell Pier, MD   1 year ago Essential hypertension   [place] Ladell Pier, MD   1 year ago Essential hypertension  [place], RPH-CPP   1 year ago Essential hypertension   [place], Amy J, NP       Future Appointments             In 1 month Wynetta Emery, Dalbert Batman, MD [place]

## 2022-03-09 MED ORDER — ATORVASTATIN CALCIUM 10 MG PO TABS: 10.0000 mg | ORAL_TABLET | Freq: Every day | ORAL | 0 refills | Status: AC

## 2022-03-09 MED ORDER — AMLODIPINE BESYLATE 5 MG PO TABS: 5.0000 mg | ORAL_TABLET | Freq: Every day | ORAL | 0 refills | Status: DC

## 2022-03-10 ENCOUNTER — Telehealth: Payer: Self-pay | Admitting: Internal Medicine

## 2022-03-10 DIAGNOSIS — U071 COVID-19: Secondary | ICD-10-CM

## 2022-03-10 NOTE — Telephone Encounter (Signed)
albuterol (VENTOLIN HFA) 108 (90 Base) MCG/ACT inhaler  baclofen (LIORESAL) 10 MG tablet  gabapentin (NEURONTIN) 300 MG capsule  [facility] - [place], [place]  [place] [address]  Phone: [phone] Fax: [phone]    Desire called from the pharmacy to request these medications. She says a fax has already been sent on Monday

## 2022-03-11 MED ORDER — ALBUTEROL SULFATE HFA 108 (90 BASE) MCG/ACT IN AERS: 2.0000 | INHALATION_SPRAY | Freq: Four times a day (QID) | RESPIRATORY_TRACT | 0 refills | Status: AC | PRN

## 2022-03-11 NOTE — Telephone Encounter (Signed)
Patient has not been seen since 04/2021 and has cancelled twice so far this year. Will send a month supply of Ventolin. However, he will need to keep his upcoming appointment for refills of gabapentin and baclofen.

## 2022-03-23 ENCOUNTER — Other Ambulatory Visit: Payer: Self-pay | Admitting: Internal Medicine

## 2022-03-23 DIAGNOSIS — I10 Essential (primary) hypertension: Secondary | ICD-10-CM

## 2022-04-12 ENCOUNTER — Ambulatory Visit: Payer: Self-pay | Admitting: Internal Medicine

## 2022-07-14 ENCOUNTER — Ambulatory Visit: Payer: Self-pay | Admitting: Internal Medicine

## 2022-07-20 ENCOUNTER — Telehealth: Payer: Self-pay | Admitting: Internal Medicine

## 2022-07-20 NOTE — Telephone Encounter (Signed)
Called & spoke to the patient. Verified name & DOB. Spoke to the patient and appointment has been scheduled for 08/16/2021.

## 2022-08-09 ENCOUNTER — Other Ambulatory Visit: Payer: Self-pay | Admitting: Internal Medicine

## 2022-08-09 DIAGNOSIS — I10 Essential (primary) hypertension: Secondary | ICD-10-CM

## 2022-08-11 ENCOUNTER — Ambulatory Visit: Payer: Medicare HMO | Attending: Internal Medicine

## 2022-08-11 VITALS — Ht 72.0 in | Wt 155.0 lb

## 2022-08-11 DIAGNOSIS — Z0000 Encounter for general adult medical examination without abnormal findings: Secondary | ICD-10-CM | POA: Diagnosis not present

## 2022-08-11 NOTE — Progress Notes (Signed)
I connected with Patrick Romero today by telephone and verified that I am speaking with the correct person using two identifiers. Location patient: home Location provider: work Persons participating in the virtual visit: Nuel, Dejaynes LPN.   I discussed the limitations, risks, security and privacy concerns of performing an evaluation and management service by telephone and the availability of in person appointments. I also discussed with the patient that there may be a patient responsible charge related to this service. The patient expressed understanding and verbally consented to this telephonic visit.    Interactive audio and video telecommunications were attempted between this provider and patient, however failed, due to patient having technical difficulties OR patient did not have access to video capability.  We continued and completed visit with audio only.     Vital signs may be patient reported or missing.  Subjective:   Patrick Romero is a 62 y.o. male who presents for Medicare Annual/Subsequent preventive examination.  Review of Systems     Cardiac Risk Factors include: advanced age (>31mn, >>29women);hypertension;male gender;smoking/ tobacco exposure     Objective:    Today's Vitals   08/11/22 1457  Weight: 155 lb (70.3 kg)  Height: 6' (1.829 m)   Body mass index is 21.02 kg/m.     08/11/2022    3:03 PM 07/08/2021    4:57 AM 02/23/2021   10:06 AM 11/25/2020    8:47 AM 11/22/2020    7:49 PM 11/22/2020    7:40 PM 03/24/2020    3:00 PM  Advanced Directives  Does Patient Have a Medical Advance Directive? No No No Yes Yes No No  Would patient like information on creating a medical advance directive?   Yes (ED - Information included in AVS)   No - Patient declined No - Patient declined    Current Medications (verified) Outpatient Encounter Medications as of 08/11/2022  Medication Sig   albuterol (VENTOLIN HFA) 108 (90 Base) MCG/ACT inhaler Inhale 2 puffs into  the lungs every 6 (six) hours as needed for wheezing or shortness of breath.   amitriptyline (ELAVIL) 25 MG tablet Take 1 tablet (25 mg total) by mouth at bedtime.   amLODipine (NORVASC) 5 MG tablet TAKE 1 TABLET (5 MG TOTAL) BY MOUTH DAILY.   aspirin EC 81 MG tablet Take 1 tablet (81 mg total) by mouth daily.   atorvastatin (LIPITOR) 10 MG tablet Take 1 tablet (10 mg total) by mouth daily.   baclofen (LIORESAL) 10 MG tablet Take 10 mg by mouth 4 (four) times daily.   gabapentin (NEURONTIN) 300 MG capsule TAKE 3 CAPSULES BY MOUTH 3 TIMES A DAY (Patient taking differently: Take 900 mg by mouth 3 (three) times daily.)   tiZANidine (ZANAFLEX) 2 MG tablet TAKE 1-2 TABLETS (2-4 MG TOTAL) BY MOUTH 4 (FOUR) TIMES DAILY. (TAKE 2 AT BEDTIME) (Patient taking differently: Take 2-4 mg by mouth 4 (four) times daily.)   NARCAN 4 MG/0.1ML LIQD nasal spray kit Place 1 spray into the nose as needed (overdose). As needed   oxyCODONE-acetaminophen (PERCOCET) 10-325 MG tablet Take 1 tablet by mouth 3 (three) times daily. (Patient not taking: Reported on 08/11/2022)   No facility-administered encounter medications on file as of 08/11/2022.    Allergies (verified) Patient has no known allergies.   History: Past Medical History:  Diagnosis Date   Arthritis    ra   Cancer ([facility]    prostate    Cancer of kidney ([facility] 2018   part of right  kidney removed   Chronic back pain    lower back burns some too   Chronic neck pain    Depression    Herniated disc, cervical    Hypertension    Sickle cell trait (HCC)    Stroke (HCC)    weakness in right hand and leg , numbness on left    Past Surgical History:  Procedure Laterality Date   CATARACT EXTRACTION Left 12/2020   ROBOT ASSISTED LAPAROSCOPIC RADICAL PROSTATECTOMY N/A 09/13/2017   Procedure: XI ROBOTIC ASSISTED LAPAROSCOPIC RADICAL PROSTATECTOMY, BILATERAL PELVIC LYMPH NODE DISSECTION;  Surgeon: [name], MD;  Location: [facility];  Service: Urology;   Laterality: N/A;   ROBOTIC ASSITED PARTIAL NEPHRECTOMY Right 01/04/2017   Procedure: XI ROBOTIC ASSITED PARTIAL NEPHRECTOMY;  Surgeon: [name], MD;  Location: [facility];  Service: Urology;  Laterality: Right;   Family History  Problem Relation Age of Onset   Hypertension Mother    Lung cancer Father    COPD Sister    Social History   Socioeconomic History   Marital status: Single    Spouse name: Not on file   Number of children: Not on file   Years of education: Not on file   Highest education level: Not on file  Occupational History    Comment: disabled  Tobacco Use   Smoking status: Every Day    Packs/day: 0.25    Types: Cigarettes   Smokeless tobacco: Never   Tobacco comments:    pt is also using nicotine patch  Vaping Use   Vaping Use: Never used  Substance and Sexual Activity   Alcohol use: Yes    Comment: occasionally   Drug use: Yes    Types: Marijuana   Sexual activity: Yes  Other Topics Concern   Not on file  Social History Narrative   Lives with mother, son, sister   Caffeine- none   Right handed   Social Determinants of Health   Financial Resource Strain: Low Risk  (08/11/2022)   Overall Financial Resource Strain (CARDIA)    Difficulty of Paying Living Expenses: Not hard at all  Food Insecurity: No Food Insecurity (08/11/2022)   Hunger Vital Sign    Worried About Running Out of Food in the Last Year: Never true    [place] in the Last Year: Never true  Transportation Needs: No Transportation Needs (08/11/2022)   PRAPARE - Hydrologist (Medical): No    Lack of Transportation (Non-Medical): No  Physical Activity: Sufficiently Active (08/11/2022)   Exercise Vital Sign    Days of Exercise per Week: 3 days    Minutes of Exercise per Session: 60 min  Stress: No Stress Concern Present (08/11/2022)   [place]    Feeling of Stress : Not at all  Social  Connections: Not on file    Tobacco Counseling Ready to quit: Yes Counseling given: Not Answered Tobacco comments: pt is also using nicotine patch   Clinical Intake:  Pre-visit preparation completed: Yes  Pain : No/denies pain     Nutritional Status: BMI of 19-24  Normal Nutritional Risks: None Diabetes: No  How often do you need to have someone help you when you read instructions, pamphlets, or other written materials from your doctor or pharmacy?: 1 - Never  Diabetic? no  Interpreter Needed?: No  Information entered by :: NAllen LPN   Activities of Daily Living    08/11/2022  3:05 PM  In your present state of health, do you have any difficulty performing the following activities:  Hearing? 0  Vision? 0  Difficulty concentrating or making decisions? 0  Walking or climbing stairs? 1  Dressing or bathing? 1  Doing errands, shopping? 1  Preparing Food and eating ? N  Using the Toilet? N  In the past six months, have you accidently leaked urine? N  Do you have problems with loss of bowel control? N  Managing your Medications? N  Managing your Finances? N  Housekeeping or managing your Housekeeping? Y    Patient Care Team: Ladell Pier, MD as PCP - General (Internal Medicine) Jettie Booze, MD as PCP - Cardiology (Cardiology)  Indicate any recent Medical Services you may have received from other than [facility] providers in the past year (date may be approximate).     Assessment:   This is a routine wellness examination for Patrick Romero.  Hearing/Vision screen Vision Screening - Comments:: Regular eye exams, [facility]  Dietary issues and exercise activities discussed: Current Exercise Habits: Home exercise routine, Type of exercise: strength training/weights;walking, Time (Minutes): 60, Frequency (Times/Week): 3, Weekly Exercise (Minutes/Week): 180   Goals Addressed             This Visit's Progress    Patient Stated       08/11/2022, stay  healthy and stay alive       Depression Screen    08/11/2022    3:05 PM 05/07/2021   11:01 AM 02/23/2021   10:06 AM 11/10/2020    4:02 PM 02/12/2020   10:42 AM 01/10/2020    3:54 PM 10/01/2019    2:23 PM  PHQ 2/9 Scores  PHQ - 2 Score 0 0 0 0 0 1 0    Fall Risk    08/11/2022    3:04 PM 05/07/2021   11:01 AM 02/23/2021   10:06 AM 11/10/2020    4:02 PM 05/12/2020    9:12 AM  Fall Risk   Falls in the past year? 1 1 0 1 1  Comment lost balance      Number falls in past yr: 1 1 0 1 1  Injury with Fall? 0 0 0 0 0  Risk for fall due to : History of fall(s);Impaired balance/gait;Impaired mobility;Medication side effect Other (Comment)     Follow up Falls prevention discussed;Education provided;Falls evaluation completed   Education provided     FALL RISK PREVENTION PERTAINING TO THE HOME:  Any stairs in or around the home? Yes  If so, are there any without handrails? No  Home free of loose throw rugs in walkways, pet beds, electrical cords, etc? Yes  Adequate lighting in your home to reduce risk of falls? Yes   ASSISTIVE DEVICES UTILIZED TO PREVENT FALLS:  Life alert? No  Use of a cane, walker or w/c? Yes  Grab bars in the bathroom? Yes  Shower chair or bench in shower? Yes  Elevated toilet seat or a handicapped toilet? No   TIMED UP AND GO:  Was the test performed? No .      Cognitive Function:    02/23/2021   10:07 AM  MMSE - Mini Mental State Exam  Orientation to time 5  Orientation to Place 5  Registration 3  Attention/ Calculation 5  Recall 3  Language- name 2 objects 2  Language- repeat 1  Language- follow 3 step command 3  Language- read & follow direction 1  Write a  sentence 1  Copy design 1  Total score 30        08/11/2022    3:07 PM  6CIT Screen  What Year? 0 points  What month? 0 points  What time? 3 points  Count back from 20 0 points  Months in reverse 4 points  Repeat phrase 10 points  Total Score 17 points    Immunizations Immunization  History  Administered Date(s) Administered   Influenza-Unspecified 03/25/2020   PFIZER(Purple Top)SARS-COV-2 Vaccination 05/13/2020   PNEUMOCOCCAL CONJUGATE-20 02/23/2021   Pneumococcal Polysaccharide-23 01/05/2017   Tdap 05/29/2018    TDAP status: Up to date  Flu Vaccine status: Due, Education has been provided regarding the importance of this vaccine. Advised may receive this vaccine at local pharmacy or Health Dept. Aware to provide a copy of the vaccination record if obtained from local pharmacy or Health Dept. Verbalized acceptance and understanding.  Pneumococcal vaccine status: Up to date  Covid-19 vaccine status: Completed vaccines  Qualifies for Shingles Vaccine? Yes   Zostavax completed No   Shingrix Completed?: No.    Education has been provided regarding the importance of this vaccine. Patient has been advised to call insurance company to determine out of pocket expense if they have not yet received this vaccine. Advised may also receive vaccine at local pharmacy or Health Dept. Verbalized acceptance and understanding.  Screening Tests Health Maintenance  Topic Date Due   Zoster Vaccines- Shingrix (1 of 2) Never done   COVID-19 Vaccine (2 - Pfizer risk series) 06/03/2020   INFLUENZA VACCINE  02/08/2022   Medicare Annual Wellness (AWV)  08/12/2023   COLONOSCOPY (Pts 45-81yr Insurance coverage will need to be confirmed)  03/06/2028   DTaP/Tdap/Td (2 - Td or Tdap) 05/29/2028   Hepatitis C Screening  Completed   HIV Screening  Completed   HPV VACCINES  Aged Out    Health Maintenance  Health Maintenance Due  Topic Date Due   Zoster Vaccines- Shingrix (1 of 2) Never done   COVID-19 Vaccine (2 - Pfizer risk series) 06/03/2020   INFLUENZA VACCINE  02/08/2022    Colorectal cancer screening: Type of screening: Colonoscopy. Completed 03/06/2018. Repeat every 5 years  Lung Cancer Screening: (Low Dose CT Chest recommended if Age 63-80years, 30 pack-year currently smoking  OR have quit w/in 15years.) does not qualify.   Lung Cancer Screening Referral: no  Additional Screening:  Hepatitis C Screening: does qualify; Completed 02/23/2021  Vision Screening: Recommended annual ophthalmology exams for early detection of glaucoma and other disorders of the eye. Is the patient up to date with their annual eye exam?  Yes  Who is the provider or what is the name of the office in which the patient attends annual [facility] exams? [facility] If pt is not established with a provider, would they like to be referred to a provider to establish care? No .   Dental Screening: Recommended annual dental exams for proper oral hygiene  Community Resource Referral / Chronic Care Management: CRR required this visit?  No   CCM required this visit?  No      Plan:     I have personally reviewed and noted the following in the patient's chart:   Medical and social history Use of alcohol, tobacco or illicit drugs  Current medications and supplements including opioid prescriptions. Patient is not currently taking opioid prescriptions. Functional ability and status Nutritional status Physical activity Advanced directives List of other physicians Hospitalizations, surgeries, and ER visits in previous  12 months Vitals Screenings to include cognitive, depression, and falls Referrals and appointments  In addition, I have reviewed and discussed with patient certain preventive protocols, quality metrics, and best practice recommendations. A written personalized care plan for preventive services as well as general preventive health recommendations were provided to patient.     Kellie Simmering, LPN   09/12/7423   Nurse Notes: none  Due to this being a virtual visit, the after visit summary with patients personalized plan was offered to patient via mail or my-chart.  to pick up at office at next visit

## 2022-08-11 NOTE — Patient Instructions (Signed)
Patrick Romero , Thank you for taking time to come for your Medicare Wellness Visit. I appreciate your ongoing commitment to your health goals. Please review the following plan we discussed and let me know if I can assist you in the future.   These are the goals we discussed:  Goals       Activity and Exercise Increased (pt-stated)      I want to improve my strength especially on my right side.      Patient Stated      08/11/2022, stay healthy and stay alive        This is a list of the screening recommended for you and due dates:  Health Maintenance  Topic Date Due   Zoster (Shingles) Vaccine (1 of 2) Never done   COVID-19 Vaccine (2 - Pfizer risk series) 06/03/2020   Flu Shot  02/08/2022   Medicare Annual Wellness Visit  08/12/2023   Colon Cancer Screening  03/06/2028   DTaP/Tdap/Td vaccine (2 - Td or Tdap) 05/29/2028   Hepatitis C Screening: USPSTF Recommendation to screen - Ages 38-79 yo.  Completed   HIV Screening  Completed   HPV Vaccine  Aged Out    Advanced directives: Advance directive discussed with you today.   Conditions/risks identified: smoking  Next appointment: Follow up in one year for your annual wellness visit   Preventive Care 40-64 Years, Male Preventive care refers to lifestyle choices and visits with your health care provider that can promote health and wellness. What does preventive care include? A yearly physical exam. This is also called an annual well check. Dental exams once or twice a year. Routine eye exams. Ask your health care provider how often you should have your eyes checked. Personal lifestyle choices, including: Daily care of your teeth and gums. Regular physical activity. Eating a healthy diet. Avoiding tobacco and drug use. Limiting alcohol use. Practicing safe sex. Taking low-dose aspirin every day starting at age 71. What happens during an annual well check? The services and screenings done by your health care provider during your  annual well check will depend on your age, overall health, lifestyle risk factors, and family history of disease. Counseling  Your health care provider may ask you questions about your: Alcohol use. Tobacco use. Drug use. Emotional well-being. Home and relationship well-being. Sexual activity. Eating habits. Work and work Statistician. Screening  You may have the following tests or measurements: Height, weight, and BMI. Blood pressure. Lipid and cholesterol levels. These may be checked every 5 years, or more frequently if you are over 17 years old. Skin check. Lung cancer screening. You may have this screening every year starting at age 9 if you have a 30-pack-year history of smoking and currently smoke or have quit within the past 15 years. Fecal occult blood test (FOBT) of the stool. You may have this test every year starting at age 67. Flexible sigmoidoscopy or colonoscopy. You may have a sigmoidoscopy every 5 years or a colonoscopy every 10 years starting at age 8. Prostate cancer screening. Recommendations will vary depending on your family history and other risks. Hepatitis C blood test. Hepatitis B blood test. Sexually transmitted disease (STD) testing. Diabetes screening. This is done by checking your blood sugar (glucose) after you have not eaten for a while (fasting). You may have this done every 1-3 years. Discuss your test results, treatment options, and if necessary, the need for more tests with your health care provider. Vaccines  Your health care provider  may recommend certain vaccines, such as: Influenza vaccine. This is recommended every year. Tetanus, diphtheria, and acellular pertussis (Tdap, Td) vaccine. You may need a Td booster every 10 years. Zoster vaccine. You may need this after age 53. Pneumococcal 13-valent conjugate (PCV13) vaccine. You may need this if you have certain conditions and have not been vaccinated. Pneumococcal polysaccharide (PPSV23) vaccine.  You may need one or two doses if you smoke cigarettes or if you have certain conditions. Talk to your health care provider about which screenings and vaccines you need and how often you need them. This information is not intended to replace advice given to you by your health care provider. Make sure you discuss any questions you have with your health care provider. Document Released: 07/24/2015 Document Revised: 03/16/2016 Document Reviewed: 04/28/2015 Elsevier Interactive Patient Education  2017 [place] Prevention in the Home Falls can cause injuries. They can happen to people of all ages. There are many things you can do to make your home safe and to help prevent falls. What can I do on the outside of my home? Regularly fix the edges of walkways and driveways and fix any cracks. Remove anything that might make you trip as you walk through a door, such as a raised step or threshold. Trim any bushes or trees on the path to your home. Use bright outdoor lighting. Clear any walking paths of anything that might make someone trip, such as rocks or tools. Regularly check to see if handrails are loose or broken. Make sure that both sides of any steps have handrails. Any raised decks and porches should have guardrails on the edges. Have any leaves, snow, or ice cleared regularly. Use sand or salt on walking paths during winter. Clean up any spills in your garage right away. This includes oil or grease spills. What can I do in the bathroom? Use night lights. Install grab bars by the toilet and in the tub and shower. Do not use towel bars as grab bars. Use non-skid mats or decals in the tub or shower. If you need to sit down in the shower, use a plastic, non-slip stool. Keep the floor dry. Clean up any water that spills on the floor as soon as it happens. Remove soap buildup in the tub or shower regularly. Attach bath mats securely with double-sided non-slip rug tape. Do not have throw  rugs and other things on the floor that can make you trip. What can I do in the bedroom? Use night lights. Make sure that you have a light by your bed that is easy to reach. Do not use any sheets or blankets that are too big for your bed. They should not hang down onto the floor. Have a firm chair that has side arms. You can use this for support while you get dressed. Do not have throw rugs and other things on the floor that can make you trip. What can I do in the kitchen? Clean up any spills right away. Avoid walking on wet floors. Keep items that you use a lot in easy-to-reach places. If you need to reach something above you, use a strong step stool that has a grab bar. Keep electrical cords out of the way. Do not use floor polish or wax that makes floors slippery. If you must use wax, use non-skid floor wax. Do not have throw rugs and other things on the floor that can make you trip. What can I do with my stairs?  Do not leave any items on the stairs. Make sure that there are handrails on both sides of the stairs and use them. Fix handrails that are broken or loose. Make sure that handrails are as long as the stairways. Check any carpeting to make sure that it is firmly attached to the stairs. Fix any carpet that is loose or worn. Avoid having throw rugs at the top or bottom of the stairs. If you do have throw rugs, attach them to the floor with carpet tape. Make sure that you have a light switch at the top of the stairs and the bottom of the stairs. If you do not have them, ask someone to add them for you. What else can I do to help prevent falls? Wear shoes that: Do not have high heels. Have rubber bottoms. Are comfortable and fit you well. Are closed at the toe. Do not wear sandals. If you use a stepladder: Make sure that it is fully opened. Do not climb a closed stepladder. Make sure that both sides of the stepladder are locked into place. Ask someone to hold it for you, if  possible. Clearly mark and make sure that you can see: Any grab bars or handrails. First and last steps. Where the edge of each step is. Use tools that help you move around (mobility aids) if they are needed. These include: Canes. Walkers. Scooters. Crutches. Turn on the lights when you go into a dark area. Replace any light bulbs as soon as they burn out. Set up your furniture so you have a clear path. Avoid moving your furniture around. If any of your floors are uneven, fix them. If there are any pets around you, be aware of where they are. Review your medicines with your doctor. Some medicines can make you feel dizzy. This can increase your chance of falling. Ask your doctor what other things that you can do to help prevent falls. This information is not intended to replace advice given to you by your health care provider. Make sure you discuss any questions you have with your health care provider. Document Released: 04/23/2009 Document Revised: 12/03/2015 Document Reviewed: 08/01/2014 Elsevier Interactive Patient Education  2017 [employer].

## 2022-08-16 ENCOUNTER — Ambulatory Visit: Payer: Medicare HMO | Admitting: Internal Medicine

## 2022-08-17 ENCOUNTER — Ambulatory Visit: Payer: Self-pay

## 2022-08-17 NOTE — Telephone Encounter (Signed)
Patient called in temp is 102.5.     Chief Complaint: Has flu symptoms. Wants PCP to know that's why he missed appointment yesterday. Needs to reschedule. No availability until May.   Symptoms: Flu Frequency:  Pertinent Negatives: Patient denies need for OV FOR Acute illness. Disposition: '[]'$ ED /'[]'$ Urgent Care (no appt availability in office) / '[]'$ Appointment(In office/virtual)/ '[]'$  [facility]/ '[]'$ Home Care/ '[]'$ Refused Recommended Disposition /'[]'$  Mobile Bus/ '[x]'$  Follow-up with PCP Additional Notes: Please assist pt. With appointment for "oxygen tank form".  Answer Assessment - Initial Assessment Questions 1. TEMPERATURE: "What is the most recent temperature?"  "How was it measured?"      102.5 2. ONSET: "When did the fever start?"      Sunday 3. CHILLS: "Do you have chills?" If yes: "How bad are they?"  (e.g., none, mild, moderate, severe)   - NONE: no chills   - MILD: feeling cold   - MODERATE: feeling very cold, some shivering (feels better under a thick blanket)   - SEVERE: feeling extremely cold with shaking chills (general body shaking, rigors; even under a thick blanket)      Mild 4. OTHER SYMPTOMS: "Do you have any other symptoms besides the fever?"  (e.g., abdomen pain, cough, diarrhea, earache, headache, sore throat, urination pain)     Cough, sore throat, headache 5. CAUSE: If there are no symptoms, ask: "What do you think is causing the fever?"      Maybe flu 6. CONTACTS: "Does anyone else in the family have an infection?"     Granddaughter 48. TREATMENT: "What have you done so far to treat this fever?" (e.g., medications)     Tylenol 8. IMMUNOCOMPROMISE: "Do you have of the following: diabetes, HIV positive, splenectomy, cancer chemotherapy, chronic steroid treatment, transplant patient, etc."     No 9. PREGNANCY: "Is there any chance you are pregnant?" "When was your last menstrual period?"     N/a 10. TRAVEL: "Have you traveled out of the country in  the last month?" (e.g., travel history, exposures)       No  Protocols used: [facility]

## 2022-08-17 NOTE — Telephone Encounter (Signed)
Call placed to patient unable to reach message left on VM. To call us for reschedule appointment.

## 2022-08-18 ENCOUNTER — Emergency Department (HOSPITAL_COMMUNITY): Payer: Medicare HMO

## 2022-08-18 ENCOUNTER — Encounter (HOSPITAL_COMMUNITY): Payer: Self-pay

## 2022-08-18 ENCOUNTER — Other Ambulatory Visit: Payer: Self-pay

## 2022-08-18 ENCOUNTER — Observation Stay (HOSPITAL_COMMUNITY)
Admission: EM | Admit: 2022-08-18 | Discharge: 2022-08-20 | Disposition: A | Discharge status: Home / Self Care | Payer: Medicare HMO | Attending: Family Medicine | Admitting: Family Medicine

## 2022-08-18 DIAGNOSIS — R059 Cough, unspecified: Secondary | ICD-10-CM | POA: Diagnosis not present

## 2022-08-18 DIAGNOSIS — R2990 Unspecified symptoms and signs involving the nervous system: Secondary | ICD-10-CM | POA: Diagnosis present

## 2022-08-18 DIAGNOSIS — R519 Headache, unspecified: Secondary | ICD-10-CM | POA: Insufficient documentation

## 2022-08-18 DIAGNOSIS — F10920 Alcohol use, unspecified with intoxication, uncomplicated: Secondary | ICD-10-CM

## 2022-08-18 DIAGNOSIS — R1110 Vomiting, unspecified: Secondary | ICD-10-CM | POA: Diagnosis not present

## 2022-08-18 DIAGNOSIS — Z1152 Encounter for screening for COVID-19: Secondary | ICD-10-CM | POA: Diagnosis not present

## 2022-08-18 DIAGNOSIS — Z7982 Long term (current) use of aspirin: Secondary | ICD-10-CM | POA: Diagnosis not present

## 2022-08-18 DIAGNOSIS — M6281 Muscle weakness (generalized): Secondary | ICD-10-CM | POA: Diagnosis not present

## 2022-08-18 DIAGNOSIS — F10129 Alcohol abuse with intoxication, unspecified: Secondary | ICD-10-CM | POA: Insufficient documentation

## 2022-08-18 DIAGNOSIS — R2681 Unsteadiness on feet: Secondary | ICD-10-CM | POA: Diagnosis not present

## 2022-08-18 DIAGNOSIS — D649 Anemia, unspecified: Secondary | ICD-10-CM | POA: Diagnosis present

## 2022-08-18 DIAGNOSIS — R55 Syncope and collapse: Secondary | ICD-10-CM | POA: Diagnosis present

## 2022-08-18 DIAGNOSIS — D509 Iron deficiency anemia, unspecified: Secondary | ICD-10-CM | POA: Diagnosis not present

## 2022-08-18 DIAGNOSIS — I959 Hypotension, unspecified: Secondary | ICD-10-CM | POA: Diagnosis not present

## 2022-08-18 DIAGNOSIS — R2689 Other abnormalities of gait and mobility: Secondary | ICD-10-CM | POA: Diagnosis not present

## 2022-08-18 DIAGNOSIS — E876 Hypokalemia: Secondary | ICD-10-CM | POA: Diagnosis not present

## 2022-08-18 DIAGNOSIS — R0602 Shortness of breath: Secondary | ICD-10-CM | POA: Diagnosis not present

## 2022-08-18 DIAGNOSIS — R509 Fever, unspecified: Secondary | ICD-10-CM | POA: Diagnosis not present

## 2022-08-18 DIAGNOSIS — R262 Difficulty in walking, not elsewhere classified: Secondary | ICD-10-CM | POA: Insufficient documentation

## 2022-08-18 DIAGNOSIS — F10929 Alcohol use, unspecified with intoxication, unspecified: Secondary | ICD-10-CM | POA: Diagnosis present

## 2022-08-18 DIAGNOSIS — Z79899 Other long term (current) drug therapy: Secondary | ICD-10-CM | POA: Diagnosis not present

## 2022-08-18 DIAGNOSIS — T68XXXA Hypothermia, initial encounter: Secondary | ICD-10-CM | POA: Diagnosis present

## 2022-08-18 LAB — RESP PANEL BY RT-PCR (RSV, FLU A&B, COVID)  RVPGX2
Influenza A by PCR: NEGATIVE
Influenza B by PCR: NEGATIVE
Resp Syncytial Virus by PCR: NEGATIVE
SARS Coronavirus 2 by RT PCR: NEGATIVE

## 2022-08-18 LAB — DIFFERENTIAL
Abs Immature Granulocytes: 0.01 10*3/uL (ref 0.00–0.07)
Basophils Absolute: 0 10*3/uL (ref 0.0–0.1)
Basophils Relative: 1 %
Eosinophils Absolute: 0.2 10*3/uL (ref 0.0–0.5)
Eosinophils Relative: 3 %
Immature Granulocytes: 0 %
Lymphocytes Relative: 57 %
Lymphs Abs: 3.7 10*3/uL (ref 0.7–4.0)
Monocytes Absolute: 0.4 10*3/uL (ref 0.1–1.0)
Monocytes Relative: 7 %
Neutro Abs: 2.1 10*3/uL (ref 1.7–7.7)
Neutrophils Relative %: 32 %

## 2022-08-18 LAB — COMPREHENSIVE METABOLIC PANEL
ALT: 10 U/L (ref 0–44)
AST: 14 U/L — ABNORMAL LOW (ref 15–41)
Albumin: 3.1 g/dL — ABNORMAL LOW (ref 3.5–5.0)
Alkaline Phosphatase: 43 U/L (ref 38–126)
Anion gap: 10 (ref 5–15)
BUN: 10 mg/dL (ref 8–23)
CO2: 18 mmol/L — ABNORMAL LOW (ref 22–32)
Calcium: 8.1 mg/dL — ABNORMAL LOW (ref 8.9–10.3)
Chloride: 108 mmol/L (ref 98–111)
Creatinine, Ser: 1 mg/dL (ref 0.61–1.24)
GFR, Estimated: >60 mL/min (ref >60)
Glucose, Bld: 106 mg/dL — ABNORMAL HIGH (ref 70–99)
Potassium: 3.1 mmol/L — ABNORMAL LOW (ref 3.5–5.1)
Sodium: 136 mmol/L (ref 135–145)
Total Bilirubin: 0.4 mg/dL (ref 0.3–1.2)
Total Protein: 5.9 g/dL — ABNORMAL LOW (ref 6.5–8.1)

## 2022-08-18 LAB — CBC
HCT: 29.6 % — ABNORMAL LOW (ref 39.0–52.0)
Hemoglobin: 9.4 g/dL — ABNORMAL LOW (ref 13.0–17.0)
MCH: 26.1 pg (ref 26.0–34.0)
MCHC: 31.8 g/dL (ref 30.0–36.0)
MCV: 82.2 fL (ref 80.0–100.0)
Platelets: 275 10*3/uL (ref 150–400)
RBC: 3.6 MIL/uL — ABNORMAL LOW (ref 4.22–5.81)
RDW: 13.2 % (ref 11.5–15.5)
WBC: 6.4 10*3/uL (ref 4.0–10.5)
nRBC: 0 % (ref 0.0–0.2)

## 2022-08-18 LAB — I-STAT CHEM 8, ED
BUN: 9 mg/dL (ref 8–23)
Calcium, Ion: 1.1 mmol/L — ABNORMAL LOW (ref 1.15–1.40)
Chloride: 108 mmol/L (ref 98–111)
Creatinine, Ser: 1.1 mg/dL (ref 0.61–1.24)
Glucose, Bld: 105 mg/dL — ABNORMAL HIGH (ref 70–99)
HCT: 32 % — ABNORMAL LOW (ref 39.0–52.0)
Hemoglobin: 10.9 g/dL — ABNORMAL LOW (ref 13.0–17.0)
Potassium: 3.2 mmol/L — ABNORMAL LOW (ref 3.5–5.1)
Sodium: 140 mmol/L (ref 135–145)
TCO2: 19 mmol/L — ABNORMAL LOW (ref 22–32)

## 2022-08-18 LAB — ETHANOL: Alcohol, Ethyl (B): 114 mg/dL — ABNORMAL HIGH (ref <10)

## 2022-08-18 LAB — CBG MONITORING, ED
Glucose-Capillary: 115 mg/dL — ABNORMAL HIGH (ref 70–99)
Glucose-Capillary: 105 mg/dL — ABNORMAL HIGH (ref 70–99)

## 2022-08-18 LAB — APTT: aPTT: 28 seconds (ref 24–36)

## 2022-08-18 LAB — CK: Total CK: 139 U/L (ref 49–397)

## 2022-08-18 LAB — PROTIME-INR
INR: 1.1 (ref 0.8–1.2)
Prothrombin Time: 14 seconds (ref 11.4–15.2)

## 2022-08-18 LAB — MAGNESIUM: Magnesium: 1.8 mg/dL (ref 1.7–2.4)

## 2022-08-18 MED ORDER — IOHEXOL 350 MG/ML SOLN
75.0000 mL | Freq: Once | INTRAVENOUS | Status: AC | PRN
  Administered 2022-08-18: 75 mL via INTRAVENOUS

## 2022-08-18 MED ORDER — LACTATED RINGERS IV BOLUS (SEPSIS)
1000.0000 mL | Freq: Once | INTRAVENOUS | Status: AC
  Administered 2022-08-18: 1000 mL via INTRAVENOUS

## 2022-08-18 MED ORDER — SODIUM CHLORIDE 0.9% FLUSH
3.0000 mL | Freq: Once | INTRAVENOUS | Status: AC
  Administered 2022-08-18: 3 mL via INTRAVENOUS

## 2022-08-18 MED ORDER — LACTATED RINGERS IV SOLN: INTRAVENOUS | Status: DC

## 2022-08-18 MED ORDER — THIAMINE MONONITRATE 100 MG PO TABS
100.0000 mg | ORAL_TABLET | Freq: Every day | ORAL | Status: DC
  Administered 2022-08-19 – 2022-08-20 (×2): 100 mg via ORAL
  Filled 2022-08-18 (×2): qty 1

## 2022-08-18 MED ORDER — LACTATED RINGERS IV BOLUS (SEPSIS)
250.0000 mL | Freq: Once | INTRAVENOUS | Status: AC
  Administered 2022-08-18: 250 mL via INTRAVENOUS

## 2022-08-18 MED ORDER — LACTATED RINGERS IV BOLUS
1000.0000 mL | Freq: Once | INTRAVENOUS | Status: AC
  Administered 2022-08-18: 1000 mL via INTRAVENOUS

## 2022-08-18 MED ORDER — ENOXAPARIN SODIUM 40 MG/0.4ML IJ SOSY
40.0000 mg | PREFILLED_SYRINGE | Freq: Every day | INTRAMUSCULAR | Status: DC
  Administered 2022-08-19 – 2022-08-20 (×2): 40 mg via SUBCUTANEOUS
  Filled 2022-08-18 (×2): qty 0.4

## 2022-08-18 MED ORDER — SODIUM CHLORIDE 0.9 % IV SOLN
2.0000 g | INTRAVENOUS | Status: DC
  Administered 2022-08-18: 2 g via INTRAVENOUS
  Filled 2022-08-18: qty 20

## 2022-08-18 MED ORDER — POTASSIUM CHLORIDE CRYS ER 20 MEQ PO TBCR
40.0000 meq | EXTENDED_RELEASE_TABLET | Freq: Once | ORAL | Status: AC
  Administered 2022-08-19: 40 meq via ORAL
  Filled 2022-08-18: qty 2

## 2022-08-18 NOTE — ED Provider Notes (Incomplete)
[place] Provider Note   CSN: [id number] Arrival date & time: 08/18/22  7408  An emergency department physician performed an initial assessment on this suspected stroke patient at 59.  History {Add pertinent medical, surgical, social history, OB history to HPI:1} Chief Complaint  Patient presents with  . 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 male.  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  Patient was brought in by EMS as a code stroke for worse right upper and right lower extremity weakness.  Seems that he had a syncopal episode while at a fish gambling location and seems that his last known normal was around 5:20 PM.  He has a history of syncopal episodes and tells me he has had a seizure before however I do not see notes of this in the past.  He states he has a headache and some neck pain currently but this time states he feels like his extremity weakness is back to its normal/baseline.  He denies any chest pain nausea vomiting diarrhea.  Seems that he may have had recent illness as  his family member describes that he recently was under the weather and had a fever.      Home Medications Prior to Admission medications   Medication Sig Start Date End Date Taking? Authorizing Provider  albuterol (VENTOLIN HFA) 108 (90 Base)  MCG/ACT inhaler Inhale 2 puffs into the lungs every 6 (six) hours as needed for wheezing or shortness of breath. 03/11/22   Ladell Pier, MD  amitriptyline (ELAVIL) 25 MG tablet Take 1 tablet (25 mg total) by mouth at bedtime. 07/12/18   Meredith Staggers, MD  amLODipine (NORVASC) 5 MG tablet TAKE 1 TABLET (5 MG TOTAL) BY MOUTH DAILY. 08/09/22   Ladell Pier, MD  aspirin EC 81 MG tablet Take 1 tablet (81 mg total) by mouth daily. 10/01/19   Ladell Pier, MD  atorvastatin (LIPITOR) 10 MG tablet Take 1 tablet (10 mg total) by mouth daily. 03/09/22   Ladell Pier, MD  baclofen (LIORESAL) 10 MG tablet Take 10 mg by mouth 4 (four) times daily. 02/09/21   [provider]  gabapentin (NEURONTIN) 300 MG capsule TAKE 3 CAPSULES BY MOUTH 3 TIMES A DAY Patient taking differently: Take 900 mg by mouth 3 (three) times daily. 04/28/20   Meredith Staggers, MD  NARCAN 4 MG/0.1ML LIQD nasal spray kit Place 1 spray into the nose as needed (overdose). As needed 09/17/18   [provider]  oxyCODONE-acetaminophen (PERCOCET) 10-325 MG tablet Take 1 tablet by mouth 3 (three) times daily. Patient not taking: Reported on 08/11/2022 10/06/20   [provider]  tiZANidine (ZANAFLEX) 2 MG tablet TAKE 1-2 TABLETS (2-4 MG TOTAL) BY MOUTH 4 (FOUR) TIMES DAILY. (TAKE 2 AT BEDTIME) Patient taking differently: Take 2-4 mg by  mouth 4 (four) times daily. 10/12/20   Meredith Staggers, MD      Allergies    Patient has no known allergies.    Review of Systems   Review of Systems  Physical Exam Updated Vital Signs BP 100/71   Pulse 68   Temp (!) 95.1 F (35.1 C) (Rectal)   Resp 14   Ht 6' (1.829 m)   Wt 70.3 kg   SpO2 99%   BMI 21.02 kg/m  Physical Exam Vitals and nursing note reviewed.  Constitutional:      General: He is not in acute distress.    Comments: Chronically ill-appearing 62 year old male.  HENT:     Head: Normocephalic and atraumatic.     Nose: Nose normal.      Mouth/Throat:     Mouth: Mucous membranes are dry.  Eyes:     General: No scleral icterus. Cardiovascular:     Rate and Rhythm: Normal rate and regular rhythm.     Pulses: Normal pulses.     Heart sounds: Normal heart sounds.  Pulmonary:     Effort: Pulmonary effort is normal. No respiratory distress.     Breath sounds: Normal breath sounds. No wheezing.  Abdominal:     Palpations: Abdomen is soft.     Tenderness: There is no abdominal tenderness. There is no guarding or rebound.  Musculoskeletal:     Cervical back: Normal range of motion.     Right lower leg: No edema.     Left lower leg: No edema.  Skin:    General: Skin is warm and dry.     Capillary Refill: Capillary refill takes less than 2 seconds.  Neurological:     Mental Status: He is alert.     Comments: Alert and oriented  Right upper extremity with significant weakness at the shoulder elbow and grip Right lower extremity with weakness of plantar dorsiflexion of ankle.  Unable to lift leg off bed.  Alert and oriented to self, place, time and event.   Speech is fluent, clear without dysarthria or dysphasia.   Strength 5/5 in upper/lower extremities   Sensation intact in upper/lower extremities   CN I not tested  CN II grossly intact visual fields bilaterally. Did not visualize posterior eye.  CN III, IV, VI PERRLA and EOMs intact bilaterally  CN V Intact sensation to sharp and light touch to the face  CN VII facial movements symmetric  CN VIII not tested  CN IX, X no uvula deviation  CN XI 5/5 SCM and trapezius strength bilaterally  CN XII Midline tongue protrusion, symmetric L/R movements     Psychiatric:        Mood and Affect: Mood normal.        Behavior: Behavior normal.     ED Results / Procedures / Treatments   Labs (all labs ordered are listed, but only abnormal results are displayed) Labs Reviewed  CBC - Abnormal; Notable for the following components:      Result Value   RBC 3.60 (*)     Hemoglobin 9.4 (*)    HCT 29.6 (*)    All other components within normal limits  COMPREHENSIVE METABOLIC PANEL - Abnormal; Notable for the following components:   Potassium 3.1 (*)    CO2 18 (*)    Glucose, Bld 106 (*)    Calcium 8.1 (*)    Total Protein 5.9 (*)    Albumin 3.1 (*)    AST 14 (*)  All other components within normal limits  ETHANOL - Abnormal; Notable for the following components:   Alcohol, Ethyl (B) 114 (*)    All other components within normal limits  I-STAT CHEM 8, ED - Abnormal; Notable for the following components:   Potassium 3.2 (*)    Glucose, Bld 105 (*)    Calcium, Ion 1.10 (*)    TCO2 19 (*)    Hemoglobin 10.9 (*)    HCT 32.0 (*)    All other components within normal limits  CBG MONITORING, ED - Abnormal; Notable for the following components:   Glucose-Capillary 115 (*)    All other components within normal limits  CBG MONITORING, ED - Abnormal; Notable for the following components:   Glucose-Capillary 105 (*)    All other components within normal limits  RESPIRATORY PANEL BY PCR  RESP PANEL BY RT-PCR (RSV, FLU A&B, COVID)  [id number]  CULTURE, BLOOD (ROUTINE X 2)  CULTURE, BLOOD (ROUTINE X 2)  PROTIME-INR  APTT  DIFFERENTIAL  MAGNESIUM  CK  LACTIC ACID, PLASMA  URINALYSIS, W/ REFLEX TO CULTURE (INFECTION SUSPECTED)    EKG None  Radiology MR CERVICAL SPINE WO CONTRAST  Result Date: 08/18/2022 CLINICAL DATA:  Initial evaluation for acute on chronic myelopathy. Presumed history of spinal cord infarct. EXAM: MRI CERVICAL SPINE WITHOUT CONTRAST TECHNIQUE: Multiplanar, multisequence MR imaging of the cervical spine was performed. No intravenous contrast was administered. COMPARISON:  Previous MRI from 09/01/2016. FINDINGS: Alignment: Examination mildly degraded by motion artifact. Straightening with slight reversal of the normal cervical lordosis. No listhesis. Vertebrae: Vertebral body height maintained without acute or chronic fracture. Bone marrow  signal intensity heterogeneous without worrisome osseous lesion. Prominent discogenic reactive endplate change with marrow edema present about the C7-T1 interspace. No other abnormal marrow edema. Cord: Patchy signal abnormality involving the bilateral hemi cord at the level of C3-4 with associated cord atrophy/volume loss, consistent with chronic myelomalacia (series 25, image 17). This is at the site of previously identified presumed cord infarct. Suspected additional subtle foci of chronic myelomalacia of inferiorly at C6-7, slightly worse on the left (series 25, image 33). No acute cord signal changes or other abnormality. Posterior Fossa, vertebral arteries, paraspinal tissues: Craniocervical junction within normal limits. Paraspinous soft tissues normal. Normal flow voids seen within the vertebral arteries bilaterally. Disc levels: C2-C3: Left greater than right uncovertebral spurring without significant disc bulge. Left-sided facet hypertrophy. No spinal stenosis. Mild to moderate left C3 foraminal narrowing. Right neural foramina remains patent. C3-C4: Bilateral uncovertebral spurring without significant disc bulge. Right-sided facet hypertrophy. No significant spinal stenosis. Moderate right with mild left C4 foraminal narrowing. C4-C5: Broad based right eccentric disc osteophyte complex flattens and partially effaces the ventral thecal sac. Associated right worse than left uncovertebral and facet hypertrophy. Mild spinal stenosis without frank cord impingement. Severe right C5 foraminal stenosis. Left neural foramen remains patent. C5-C6: Degenerative intervertebral disc space narrowing with diffuse disc osteophyte complex, slightly asymmetric to the right. Broad posterior component flattens and partially effaces the ventral thecal sac. Mild spinal stenosis. Severe right worse than left C6 foraminal narrowing. C6-C7: Degenerative intervertebral disc space narrowing with diffuse disc osteophyte complex.  Mild flattening of the ventral thecal sac without significant spinal stenosis. Severe right with moderate left C7 foraminal stenosis. C7-T1: Diffuse disc bulge with uncovertebral spurring. Right greater than left facet and ligament flavum hypertrophy. Mild spinal stenosis. Mild right with moderate left C8 foraminal narrowing. IMPRESSION: 1. Patchy signal abnormality involving the bilateral cord at the level  of C3-4, consistent with chronic myelomalacia. This is at the site of previously identified cord infarct. Suspected additional subtle foci of chronic myelomalacia inferiorly at C6-7, slightly worse on the left. No acute cord signal changes or other abnormality. 2. Underlying multilevel cervical spondylosis with resultant mild spinal stenosis at C4-5 through C7-T1. No frank cord impingement. 3. Multifactorial degenerative changes with resultant multilevel foraminal narrowing as above. Notable findings include moderate right C4 foraminal stenosis, severe right C5 foraminal narrowing, severe right worse than left C6 foraminal stenosis, with severe right and moderate left C7 foraminal narrowing. Electronically Signed   By: Jeannine Boga M.D.   On: 08/18/2022 21:40   MR BRAIN WO CONTRAST  Result Date: 08/18/2022 CLINICAL DATA:  Initial evaluation for neuro deficit, stroke suspected. EXAM: MRI HEAD WITHOUT CONTRAST TECHNIQUE: Multiplanar, multiecho pulse sequences of the brain and surrounding structures were obtained without intravenous contrast. COMPARISON:  Prior CT from earlier the same day as well as previous brain MRI from 11/23/2020. FINDINGS: Brain: Cerebral volume within normal limits. No significant cerebral white matter disease for age. No evidence for acute or subacute ischemia. Gray-white matter differentiation maintained. No areas of chronic cortical infarction. No acute or chronic intracranial blood products. Presumed meningioma measuring up to 6 mm along the right tentorium (series 21, image  13), stable. No associated edema or mass effect. No other mass lesion or midline shift. No hydrocephalus or extra-axial fluid collection. Pituitary gland and suprasellar region within normal limits. Vascular: Major intracranial vascular flow voids are maintained. Skull and upper cervical spine: Craniocervical junction within normal limits. Bone marrow signal intensity normal. No scalp soft tissue abnormality. Sinuses/Orbits: Prior bilateral ocular lens replacement. Paranasal sinuses are largely clear. Trace right mastoid effusion noted, of doubtful significance. Other: 6 mm retention cyst noted at the central nasopharynx. IMPRESSION: 1. No acute intracranial abnormality. 2. 6 mm meningioma along the right tentorium without edema or mass effect, stable. 3. Otherwise normal brain MRI for age. Electronically Signed   By: Jeannine Boga M.D.   On: 08/18/2022 21:30   DG Chest Portable 1 View  Result Date: 08/18/2022 CLINICAL DATA:  Shortness of breath, code stroke EXAM: PORTABLE CHEST 1 VIEW COMPARISON:  07/08/2021 FINDINGS: Atherosclerotic calcification of the aortic arch. Tapering of the peripheral pulmonary vasculature favors emphysema. The lungs appear otherwise clear. No blunting of the costophrenic angles. No significant bony abnormality. IMPRESSION: 1. No acute findings. 2. Aortic Atherosclerosis (ICD10-I70.0) and Emphysema (ICD10-J43.9). Electronically Signed   By: Van Clines M.D.   On: 08/18/2022 21:28   CT HEAD CODE STROKE WO CONTRAST  Result Date: 08/18/2022 CLINICAL DATA:  Initial evaluation for acute neuro deficit, stroke. EXAM: CT HEAD WITHOUT CONTRAST CT ANGIOGRAPHY HEAD AND NECK TECHNIQUE: Multidetector CT imaging of the head and neck was performed using the standard protocol during bolus administration of intravenous contrast. Multiplanar CT image reconstructions and MIPs were obtained to evaluate the vascular anatomy. Carotid stenosis measurements (when applicable) are obtained  utilizing NASCET criteria, using the distal internal carotid diameter as the denominator. RADIATION DOSE REDUCTION: This exam was performed according to the departmental dose-optimization program which includes automated exposure control, adjustment of the mA and/or kV according to patient size and/or use of iterative reconstruction technique. CONTRAST:  54m OMNIPAQUE IOHEXOL 350 MG/ML SOLN COMPARISON:  Comparison made with prior CT from 07/08/2021. FINDINGS: CT HEAD FINDINGS Brain: Cerebral volume within normal limits. No acute intracranial hemorrhage. No acute large vessel territory infarct. 5 mm hyperdense lesion positioned along the right tentorium,  likely a small meningioma. No associated edema or mass effect. No other mass lesion. No midline shift. No hydrocephalus or extra-axial fluid collection. Vascular: No abnormal hyperdense vessel. Skull: Scalp soft tissues and calvarium within normal limits. Sinuses/Orbits: Globes orbital soft tissues demonstrate no acute finding. Paranasal sinuses and mastoid air cells are largely clear. Other: None. ASPECTS ([place] Stroke Program Early CT Score) - Ganglionic level infarction (caudate, lentiform nuclei, internal capsule, insula, M1-M3 cortex): 7 - Supraganglionic infarction (M4-M6 cortex): 3 Total score (0-10 with 10 being normal): 10 CTA NECK FINDINGS Aortic arch: Visualized aortic arch normal caliber standard branch pattern. No visible stenosis about the origin of the great vessels. Right carotid system: Right common and internal carotid arteries are patent without dissection. Mild atheromatous change about the right carotid bulb/proximal right ICA without hemodynamically significant greater than 50% stenosis. Left carotid system: Left common and internal carotid arteries are patent without dissection. Mild atheromatous change about the left carotid bulb/proximal left ICA with associated stenosis of up to 50% by NASCET criteria (series 7, image 216). Vertebral  arteries: Both vertebral arteries arise from subclavian arteries. No proximal subclavian artery stenosis. Vertebral arteries patent without stenosis or dissection. Skeleton: No discrete or worrisome osseous lesions. Mild-to-moderate multilevel cervical spondylosis without high-grade spinal stenosis. Other neck: Cyst no other acute soft tissue abnormality within the neck. Small retention cyst noted at the central nasopharynx. Upper chest: Mild emphysema. Visualized upper chest demonstrates no other acute finding. Review of the MIP images confirms the above findings CTA HEAD FINDINGS Anterior circulation: Mild atheromatous change within the right carotid siphon without hemodynamically significant stenosis. A1 segments patent bilaterally. Normal anterior communicating artery complex. Anterior cerebral arteries patent without stenosis. No M1 stenosis or occlusion. No proximal MCA branch occlusion or high-grade stenosis. Distal MCA branches perfused and symmetric. Posterior circulation: Both V4 segments patent without stenosis. Left vertebral artery dominant. Both PICA patent. Basilar widely patent without stenosis. Superior cerebellar and posterior cerebral arteries patent bilaterally. Venous sinuses: Patent allowing for timing the contrast bolus. Anatomic variants: None significant.  No aneurysm. Review of the MIP images confirms the above findings IMPRESSION: CT HEAD: 1. No acute intracranial abnormality. 2. Aspects = 10. 3. 5 mm hyperdense lesion along the right tentorium, likely a small meningioma. No associated edema or mass effect. CTA HEAD AND NECK: 1. Negative CTA for large vessel occlusion or other emergent finding. 2. 50% atheromatous stenosis about the left carotid bulb/proximal left ICA. 3. Additional mild atheromatous change about the carotid bifurcations and carotid siphons without hemodynamically significant or correctable stenosis. These results were communicated to Dr. Curly Shores at 7:44 pm on 08/18/2022 by  text page via the [facility] messaging system. Electronically Signed   By: Jeannine Boga M.D.   On: 08/18/2022 19:52   CT ANGIO HEAD NECK W WO CM (CODE STROKE)  Result Date: 08/18/2022 CLINICAL DATA:  Initial evaluation for acute neuro deficit, stroke. EXAM: CT HEAD WITHOUT CONTRAST CT ANGIOGRAPHY HEAD AND NECK TECHNIQUE: Multidetector CT imaging of the head and neck was performed using the standard protocol during bolus administration of intravenous contrast. Multiplanar CT image reconstructions and MIPs were obtained to evaluate the vascular anatomy. Carotid stenosis measurements (when applicable) are obtained utilizing NASCET criteria, using the distal internal carotid diameter as the denominator. RADIATION DOSE REDUCTION: This exam was performed according to the departmental dose-optimization program which includes automated exposure control, adjustment of the mA and/or kV according to patient size and/or use of iterative reconstruction technique. CONTRAST:  34m OMNIPAQUE IOHEXOL  350 MG/ML SOLN COMPARISON:  Comparison made with prior CT from 07/08/2021. FINDINGS: CT HEAD FINDINGS Brain: Cerebral volume within normal limits. No acute intracranial hemorrhage. No acute large vessel territory infarct. 5 mm hyperdense lesion positioned along the right tentorium, likely a small meningioma. No associated edema or mass effect. No other mass lesion. No midline shift. No hydrocephalus or extra-axial fluid collection. Vascular: No abnormal hyperdense vessel. Skull: Scalp soft tissues and calvarium within normal limits. Sinuses/Orbits: Globes orbital soft tissues demonstrate no acute finding. Paranasal sinuses and mastoid air cells are largely clear. Other: None. ASPECTS ([place] Stroke Program Early CT Score) - Ganglionic level infarction (caudate, lentiform nuclei, internal capsule, insula, M1-M3 cortex): 7 - Supraganglionic infarction (M4-M6 cortex): 3 Total score (0-10 with 10 being normal): 10 CTA NECK FINDINGS  Aortic arch: Visualized aortic arch normal caliber standard branch pattern. No visible stenosis about the origin of the great vessels. Right carotid system: Right common and internal carotid arteries are patent without dissection. Mild atheromatous change about the right carotid bulb/proximal right ICA without hemodynamically significant greater than 50% stenosis. Left carotid system: Left common and internal carotid arteries are patent without dissection. Mild atheromatous change about the left carotid bulb/proximal left ICA with associated stenosis of up to 50% by NASCET criteria (series 7, image 216). Vertebral arteries: Both vertebral arteries arise from subclavian arteries. No proximal subclavian artery stenosis. Vertebral arteries patent without stenosis or dissection. Skeleton: No discrete or worrisome osseous lesions. Mild-to-moderate multilevel cervical spondylosis without high-grade spinal stenosis. Other neck: Cyst no other acute soft tissue abnormality within the neck. Small retention cyst noted at the central nasopharynx. Upper chest: Mild emphysema. Visualized upper chest demonstrates no other acute finding. Review of the MIP images confirms the above findings CTA HEAD FINDINGS Anterior circulation: Mild atheromatous change within the right carotid siphon without hemodynamically significant stenosis. A1 segments patent bilaterally. Normal anterior communicating artery complex. Anterior cerebral arteries patent without stenosis. No M1 stenosis or occlusion. No proximal MCA branch occlusion or high-grade stenosis. Distal MCA branches perfused and symmetric. Posterior circulation: Both V4 segments patent without stenosis. Left vertebral artery dominant. Both PICA patent. Basilar widely patent without stenosis. Superior cerebellar and posterior cerebral arteries patent bilaterally. Venous sinuses: Patent allowing for timing the contrast bolus. Anatomic variants: None significant.  No aneurysm. Review of  the MIP images confirms the above findings IMPRESSION: CT HEAD: 1. No acute intracranial abnormality. 2. Aspects = 10. 3. 5 mm hyperdense lesion along the right tentorium, likely a small meningioma. No associated edema or mass effect. CTA HEAD AND NECK: 1. Negative CTA for large vessel occlusion or other emergent finding. 2. 50% atheromatous stenosis about the left carotid bulb/proximal left ICA. 3. Additional mild atheromatous change about the carotid bifurcations and carotid siphons without hemodynamically significant or correctable stenosis. These results were communicated to Dr. Curly Shores at 7:44 pm on 08/18/2022 by text page via the [facility] messaging system. Electronically Signed   By: Jeannine Boga M.D.   On: 08/18/2022 19:52    Procedures .Critical Care  Performed by: Tedd Sias, PA Authorized by: Tedd Sias, PA   Critical care provider statement:    Critical care time (minutes):  35   Critical care time was exclusive of:  Separately billable procedures and treating other patients and teaching time   Critical care was necessary to treat or prevent imminent or life-threatening deterioration of the following conditions:  Sepsis (hypotension/hypothermia/AMS)   Critical care was time spent personally by me on the  following activities:  Development of treatment plan with patient or surrogate, review of old charts, re-evaluation of patient's condition, pulse oximetry, ordering and review of radiographic studies, ordering and review of laboratory studies, ordering and performing treatments and interventions, obtaining history from patient or surrogate, examination of patient and evaluation of patient's response to treatment   Care discussed with: admitting provider     {Document cardiac monitor, telemetry assessment procedure when appropriate:1}  Medications Ordered in ED Medications  lactated ringers infusion ( Intravenous New Bag/Given 08/18/22 2145)  cefTRIAXone (ROCEPHIN) 2 g in  sodium chloride 0.9 % 100 mL IVPB (0 g Intravenous Stopped 08/18/22 2143)  sodium chloride flush (NS) 0.9 % injection 3 mL (3 mLs Intravenous Given 08/18/22 2036)  iohexol (OMNIPAQUE) 350 MG/ML injection 75 mL (75 mLs Intravenous Contrast Given 08/18/22 1931)  lactated ringers bolus 1,000 mL (0 mLs Intravenous Stopped 08/18/22 2224)  lactated ringers bolus 1,000 mL (1,000 mLs Intravenous New Bag/Given 08/18/22 2134)    And  lactated ringers bolus 250 mL (0 mLs Intravenous Stopped 08/18/22 2224)    ED Course/ Medical Decision Making/ A&P Clinical Course as of 08/18/22 2330  Thu Aug 18, 2022  1936 Patient is a 62 year old male with past medical history significant for alcohol use disorder, prior strokes with residual right-sided upper and lower extremity weakness presented emergency room today brought in as code stroke seems that patient lost consciousness at 5:20 PM and had worse right upper and right lower extremity weakness than his baseline.  Patient is currently in [place].  CT head without contrast is negative for bleed.  CTA in process.  Did have urinary incontinence.  This begs a question of withdrawal seizure with his history of alcohol use.  Was also noted to have a blood pressure of 82 over palp on initial evaluation.  Per EMS improved to 116/76 with small bolus of fluid. [WF]  2252 Admitted to Dr. Edd Arbour of family medicine teaching service. [WF]    Clinical Course User Index [WF] Tedd Sias, PA   {   Click here for ABCD2, HEART and other calculatorsREFRESH Note before signing :1}                          Medical Decision Making Amount and/or Complexity of Data Reviewed Labs: ordered. Radiology: ordered.  Risk Prescription drug management. Decision regarding hospitalization.   This patient presents to the ED for concern of code stroke, this involves a number of treatment options, and is a complaint that carries with it a high risk of complications and morbidity. 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  Patient was brought in by EMS as a code stroke for worse right upper and right lower extremity weakness.  Seems that he had a syncopal episode while at a fish gambling location and seems that his last known normal was around 5:20 PM.  He has a history of syncopal episodes and tells me he has had a seizure before however I do not see notes of this in the past.  He states he has a headache and some neck pain currently but this time states he feels like his extremity weakness is  back to its normal/baseline.  He denies any chest pain nausea vomiting diarrhea.  Seems that he may have had recent illness as  his family member describes that he recently was under the weather and had a fever.    EMR reviewed including pt PMHx, past surgical history and past visits to ER.   See HPI for more details   Lab Tests:  I ordered and independently interpreted labs. Labs notable for    Imaging Studies:  I personally viewed images from CTA head and neck as well as noncontrast CT head, chest x-ray.  MRI brain and C-spine were reviewed by Dr. Isac Sarna of neurology.    Cardiac Monitoring:  .The patient was maintained on a cardiac monitor.  I personally viewed and interpreted the cardiac monitored which showed an underlying rhythm of:  Marland Kitchen{Blank single:19197::"EKG non-ischemic","NA"}   Medicines ordered:  I ordered medication including ***  for *** Reevaluation of the patient after these medicines showed that the patient {resolved/improved/worsened:23923::"improved"} I have reviewed the patients home medicines and have made adjustments as needed   Critical  Interventions:  .***   Consults/Attending Physician   .{Blank single:19197::"I requested consultation with ***,  and discussed lab and imaging findings as well as pertinent plan - they recommend: ***","I discussed this case with my attending physician who cosigned this note including patient's presenting symptoms, physical exam, and planned diagnostics and interventions. Attending physician stated agreement with plan or made changes to plan which were implemented."}   Reevaluation:  After the interventions noted above I re-evaluated patient and found that they have :{resolved/improved/worsened:23923::"improved"}   Social Determinants of Health:  Marland Kitchen{Blank single:19197::"Given cab voucher","Social work/case management involved","The patient's social determinants of health were a factor in the care of this patient"}    Problem List / ED Course:  ***   Dispostion:  After consideration of the diagnostic results and the patients response to treatment, I feel that the patent would benefit from ***        Final Clinical Impression(s) / ED Diagnoses Final diagnoses:  None    Rx / DC Orders ED Discharge Orders     None

## 2022-08-18 NOTE — Code Documentation (Signed)
Responded to Code Stroke called at 1851 for R sided deficits and slurred speech, [id number]. Pt arrived at 1917, CBG-115, NIH-12>8 , CT head/CTA negative. Pt taken to MRI at [facility]. MRI brain-negative for stroke. MRI C-spine negative for cord infarct. TNK not given. Code Stroke cancelled at 2020.

## 2022-08-18 NOTE — Consult Note (Addendum)
Neurology Consultation Reason for Consult: Code stroke Requesting Physician: Wylene Simmer  CC: Syncope, worsening right sided weakness   History is obtained from: Patient, EMS, Family and chart review   HPI: Patrick Romero is a 62 y.o. male with a past medical history significant for prior cervical spine ischemic event (2019 versus posttraumatic), likely migraine headache symptoms stable meningioma, quarter pack per day smoking, alcohol use, hypertension, hyperlipidemia, recurrent syncope  Patient is too sleepy to provide any significant history.  Per sister on the phone, patient was ill both Monday with fever and runny nose, URI symptoms.  However he was improving and today went out at his baseline around 2 or 3 PM.  While he was out he had witnessed onset syncope followed by worsening right-sided deficits for which EMS was activated at 5:20 PM.  He reports he had 2 beers today.  EMS reports that on their initial arrival blood pressure was 82/50 which improved to 116/76 with 258 mL normal saline bolus.  Heart rate 66, CBG 118.  They noted that he had 2 episodes of syncope while with them and route.  He also had urinary incontinence without seizure-like activity.  Neurological examination was otherwise stable with right greater than left-sided weakness but no clear vision or gaze abnormalities within limits of his lethargy  LKW: 3:20 PM Thrombolytic given?: No, MRI brain and C-spine negative for acute stroke  Checklist of contraindications was reviewed and negative. Risks, benefits and alternatives were discussed  IA performed?: No, no LVO Premorbid modified rankin scale:      3 - Moderate disability. Requires some help, but able to walk unassisted (with cane but without support of another person)  ROS: Unable to obtain due to altered mental status.  TNK checklist reviewed with family and was negative, with additional report of URI symptoms, improving the last few days, as above  Past Medical  History:  Diagnosis Date   Arthritis    ra   Cancer ([place])    prostate    Cancer of kidney ([place]) 2018   part of right kidney removed   Chronic back pain    lower back burns some too   Chronic neck pain    Depression    Herniated disc, cervical    Hypertension    Sickle cell trait ([place])    Stroke ([place])    weakness in right hand and leg , numbness on left    Past Surgical History:  Procedure Laterality Date   CATARACT EXTRACTION Left 12/2020   ROBOT ASSISTED LAPAROSCOPIC RADICAL PROSTATECTOMY N/A 09/13/2017   Procedure: XI ROBOTIC ASSISTED LAPAROSCOPIC RADICAL PROSTATECTOMY, BILATERAL PELVIC LYMPH NODE DISSECTION;  Surgeon: [name], MD;  Location: [facility];  Service: Urology;  Laterality: N/A;   ROBOTIC ASSITED PARTIAL NEPHRECTOMY Right 01/04/2017   Procedure: XI ROBOTIC ASSITED PARTIAL NEPHRECTOMY;  Surgeon: [name], MD;  Location: [facility];  Service: Urology;  Laterality: Right;   Current Outpatient Medications  Medication Instructions   albuterol (VENTOLIN HFA) 108 (90 Base) MCG/ACT inhaler 2 puffs, Inhalation, Every 6 hours PRN   amitriptyline (ELAVIL) 25 mg, Oral, Daily at bedtime   amLODipine (NORVASC) 5 mg, Oral, Daily   aspirin EC 81 mg, Oral, Daily   atorvastatin (LIPITOR) 10 mg, Oral, Daily   baclofen (LIORESAL) 10 mg, Oral, 4 times daily   gabapentin (NEURONTIN) 300 MG capsule TAKE 3 CAPSULES BY MOUTH 3 TIMES A DAY   NARCAN 4 MG/0.1ML LIQD nasal spray kit 1 spray, Nasal, As needed,  As needed   oxyCODONE-acetaminophen (PERCOCET) 10-325 MG tablet 1 tablet, 3 times daily   tiZANidine (ZANAFLEX) 2-4 mg, Oral, 4 times daily, (Take 2 at bedtime)     Family History  Problem Relation Age of Onset   Hypertension Mother    Lung cancer Father    COPD Sister    Social History:  reports that he has been smoking cigarettes. He has been smoking an average of .25 packs per day. He has never used smokeless tobacco. He reports current alcohol use. He reports current  drug use. Drug: Marijuana.  Exam: Current vital signs: BP 92/67 (BP Location: Right Arm)   Pulse 62   Temp (!) 93.8 F (34.3 C) (Rectal)   Resp 16   Ht 6' (1.829 m)   Wt 70.3 kg   SpO2 100%   BMI 21.02 kg/m  Vital signs in last 24 hours: Temp:  [93.8 F (34.3 C)] 93.8 F (34.3 C) (02/08 2045) Pulse Rate:  [62] 62 (02/08 2032) Resp:  [16] 16 (02/08 2032) BP: (92)/(67) 92/67 (02/08 2032) SpO2:  [100 %] 100 % (02/08 2032) Weight:  [67.6 kg-70.3 kg] 70.3 kg (02/08 2032)   Physical Exam  Constitutional: Appears thin, malnourished Psych: Minimally interactive Eyes: No scleral injection HENT: No oropharyngeal obstruction.  MSK: no major joint deformities.  Cardiovascular: Hypotensive but warm extremities  Respiratory: Effort normal, non-labored breathing GI: Soft.  No distension. There is no tenderness.  Skin: Warm dry and intact visible skin  Neuro: Mental Status: Patient is very somnolent, with repeated  Cranial Nerves: II: Visual Fields are full.  III,IV, VI: EOMI without ptosis or diploplia.  V: Facial sensation is symmetric to temperature VII: Facial movement is symmetric.  VIII: hearing is intact to voice X: Uvula elevates symmetrically XI: Shoulder shrug is symmetric. XII: tongue is midline without atrophy or fasciculations.  Motor: Right greater than left-sided weakness, able to move the right upper extremity 2/5, right lower extremity 0/5, left upper extremity and lower extremity 3/5 Sensory: Sensation is symmetric to light touch throughout Cerebellar: FNF intact bilaterally Gait:  Deferred due to weakness  NIHSS total 12 Score breakdown: 2 points for level of consciousness, 1 point for left upper extremity drift, 2 points for right upper extremity drift, 3 points for left leg drift, 3 points for right leg drift, 1-point for dysarthria  Performed at time of patient arrival to ED    I have reviewed labs in epic and the results pertinent to this  consultation are:  Basic Metabolic Panel: Recent Labs  Lab 08/18/22 1919 08/18/22 1926  NA 136 140  K 3.1* 3.2*  CL 108 108  CO2 18*  --   GLUCOSE 106* 105*  BUN 10 9  CREATININE 1.00 1.10  CALCIUM 8.1*  --   MG 1.8  --     CBC: Recent Labs  Lab 08/18/22 1919 08/18/22 1926  WBC 6.4  --   NEUTROABS 2.1  --   HGB 9.4* 10.9*  HCT 29.6* 32.0*  MCV 82.2  --   PLT 275  --     Coagulation Studies: Recent Labs    08/18/22 1919  LABPROT 14.0  INR 1.1      I have personally reviewed the images obtained:  Head CT negative for acute intracranial process 1. No acute intracranial abnormality. 2. Aspects = 10. 3. 5 mm hyperdense lesion along the right tentorium, likely a small meningioma. No associated edema or mass effect.  CTA HEAD AND NECK: 1.  Negative CTA for large vessel occlusion or other emergent finding. 2. 50% atheromatous stenosis about the left carotid bulb/proximal left ICA. 3. Additional mild atheromatous change about the carotid bifurcations and carotid siphons without hemodynamically significant or correctable stenosis.   MRI brain and MRI C-spine personally reviewed, formal radiology read pending (after all sequences completed) but negative for acute process on preliminary read  Code stroke recommendations: -CTA personally reviewed, negative for LVO, obtained to rule out basilar thrombosis given his examination -MRI brain negative for acute stroke, personally reviewed, agree with radiology, negative for acute ischemia on preliminary read  -MRI C-spine obtained to rule out recurrent spinal cord ischemia, personally reviewed, agree with radiology negative for acute process on prelim read  Impression: Syncope with recrudescence of prior spinal cord stroke symptoms.  With symptoms as described, very low concern for seizure activity, but given this was a secondhand report, will obtain an EEG to screen for epilepsy playing a role  Recommendations: -  Expect symptoms to improve with supportive chart  - Routine EEG - Syncope workup per EDP  - Addendum, respiratory pathogen testing ordered to evaluate for etiology of URI which may have been contributing to his syncope and can lower seizure threshold - Inpatient neurology will follow-up EEG but otherwise will be available as needed.  Please reach out if additional questions or concerns arise  Lesleigh Noe MD-PhD [facility] [phone] Available 7 PM to 7 AM, outside of these hours please call Neurologist on call as listed on Amion.  Total critical care time: 60 minutes   Critical care time was exclusive of separately billable procedures and treating other patients.   Critical care was necessary to treat or prevent imminent or life-threatening deterioration.   Critical care was time spent personally by me on the following activities: development of treatment plan with patient and/or surrogate as well as nursing, discussions with consultants/primary team, evaluation of patient's response to treatment, examination of patient, obtaining history from patient or surrogate, ordering and performing treatments and interventions, ordering and review of laboratory studies, ordering and review of radiographic studies, and re-evaluation of patient's condition as needed, as documented above.

## 2022-08-18 NOTE — H&P (Addendum)
Hospital Admission History and Physical Service Pager: [phone]  Patient name: Patrick Romero Medical record number: [id number] Date of Birth: [birth year] Age: 62 y.o. Gender: male  Primary Care Provider: Ladell Pier, MD Consultants: Neuro Code Status: Full Preferred Emergency Contact: Robrt Nesler (mother) [phone] Yanick Sylte [phone]  Chief Complaint: syncope  Assessment and Plan: LEVAN SOOY is a 62 y.o. male presenting with syncopal episode in the setting of alcohol intoxication. Imaging rules out stroke. Differential for presentation of this includes orthostatic hypotension, cardiac arrhythmia, situational syncope, seizure.   * Syncope Most likely etiology is hypotension 2/2 dehydration in the setting of alcohol use and recent illness with GI losses/fever. Evidence for this includes prodromal lightheadedness, initial BP 92/67, lactate 3.0, urine spec grav 1.034. Of note, multiple very similar presentations of syncopal episodes in the past dating back to at least 2018, usually in the setting of alcohol use or dehydration.  Patient previously underwent workup including echo, carotid dopplers, EEG in May 2022 which were unremarkable. Could also be some component of autonomic dysfunction related to his spinal cord injury but this is less likely. - admit to FMTS telemetry attending Dr. Andria Frames - neurology consulted, appreciate recs - fall precautions - f/u EEG - f/u orthostatic vitals - LR at 170m/hr - f/u UDS  Right sided weakness Presented as code stroke due to R sided weakness. Unclear if this was acute as patient has baseline R sided weakness from prior spinal cord injury. Imaging negative for acute stroke. Neuro exam appears at baseline. -f/u EEG  (overall low concern for seizure)  Hypotension Initial BP 92/67. Improved after 1L LR bolus x2. Likely volume depletion related to alcohol use and recent illness. Consider infectious/sepsis source as well  given temp 93.8. Has chronic HTN - hold home amlodipine 5 mg - mIVF (LR at 1543mhr) - s/p Ceftriaxone 2g in the ED  Hypothermia Initial temp 93.8, now improving. Unclear cause. No other signs/symptoms to suggest infectious etiology - continue active rewarming (bairhugger started in ED) - monitor temp - s/p Ceftriaxone 2g in the ED  Alcohol intoxication (HCOssipeeOn admission blood alcohol level 114. Patient reports last drink was 4 or 5 pm today - denies prior history of withdrawal symptoms. Does not believe his alcohol use is a problem (pre-contemplative stage of change at this time) - CIWA monitoring q8h without Ativan for now - continue gabapentin 300 mg TID - thiamine 100 mg daily, folate, MVI  Hypokalemia On admission K 3.2. - given 40 mEq K - am BMP  Iron deficiency anemia Chronic problem. Not on iron supplementation - ?iron supplement  Other chronic problems: Central cord syndrome s/p trauma, chronic pain - continue home gabapentin 300 mg TID Muscle spasms - continue baclofen 10 mg QID but switched to PRN HLD - continue atorvastatin 10 mg daily, f/u lipid panel Aortic atherosclerosis - continue home aspirin 81 mg daily  FEN/GI: regular diet VTE Prophylaxis: subcutaneous enoxaparin  Disposition: med-tele  History of Present Illness:  Patrick Romero a [age].o. male presenting with syncopal episode.  Patient was at the game hall sitting on a barstool when he had a witnessed syncopal episode. Believes some staff members called EMS. Thinks he hit head but is not sure. Reports he was drinking alcohol, had 24 oz beer around 4 - 5 pm on 08/18/2022. Per EMS had R sided weakness so came in as code stroke. Apparently had incontinence with it as well.  Endorses prior history of  syncopal episodes, he thinks since 2018, sometimes when drinking alcohol, sometimes in the absence of alcohol. He does not think it's related. He denies experiencing other alcohol withdrawal symptoms (tremors  / shakes, seizures, hallucinations, delirium tremens) in the past.  Earlier this week he reports having had fever, chills, cough, headache, and vomiting for a few days. This resolved after 5-6 days. Denies any complaints currently.  In the ED, patient was seen by neurology as code stroke. Imaging rules out stroke. Noted to be hypotensive and hypothermic, received 2L fluid bolus and a dose of Ceftriaxone.  Review Of Systems: Denies chest pain, shortness of breath, abdominal pain, nausea, vomiting.  Pertinent Past Medical History: HTN Cervical spine ischemic event vs central cord syndrome s/p fall (2018 or 2019) Alcohol use disorder COPD Remainder reviewed in history tab.   Pertinent Past Surgical History: Partial nephrectomy 2019 Prostatectomy Remainder reviewed in history tab.   Pertinent Social History: Tobacco use: Yes, 0.5-1PPD since age 19 Alcohol use: Yes, "3-4 days per week, 2-3 drinks per occasion" Other Substance use: Marijuana occasionally; has used cocaine before but not regularly. Was in rehab at some point for narcotics Lives in 3 bedroom house with elderly mother  Pertinent Family History: Sister diabetes  Remainder reviewed in history tab.   Important Outpatient Medications: Gabapentin 398m three times daily Baclofen 176mTID Amlodipine 3m106maily ASA 82m6mily Atorvastatin 10mg60mly Remainder reviewed in medication history.   Objective: BP 100/72   Pulse 73   Temp 98.1 F (36.7 C) (Oral)   Resp 15   Ht 6' (1.829 m)   Wt 70.3 kg   SpO2 100%   BMI 21.02 kg/m  Exam: General: in no acute distress HEENT: normocephalic and atraumatic Respiratory: CTAB anteriorly, normal respiratory effort, and on RA Extremities: moving all extremities spontaneously Gastrointestinal: non-tender Cardiovascular: regular rate  Mental Status: Patrick HEINBAUGHomnolent but responsive throughout interview; he is oriented to self, place, time, and situation. Speech was  slurred without evidence of aphasia.  Cranial Nerves: II:  Visual fields pupils equal, round, reactive to light and accommodation III,IV, VI: no ptosis V,VII: smile symmetric, facial light touch sensation intact bilaterally IX,X: uvula rises symmetrically XII: midline tongue extension without atrophy and without fasciculations  Motor: Right : Upper extremity   4/5 full range of motion against gravity and offers some resistance  Lower extremity   3/5 full range of motion against gravity  Left: Upper extremity   4/5 full range of motion against gravity and offers some resistance Lower extremity   3/5 full range of motion against gravity  Gait: not observed during encounter  Labs:  CBC BMET  Recent Labs  Lab 08/18/22 1919 08/18/22 1926  WBC 6.4  --   HGB 9.4* 10.9*  HCT 29.6* 32.0*  PLT 275  --    Recent Labs  Lab 08/18/22 1919 08/18/22 1926  NA 136 140  K 3.1* 3.2*  CL 108 108  CO2 18*  --   BUN 10 9  CREATININE 1.00 1.10  GLUCOSE 106* 105*  CALCIUM 8.1*  --      EKG: sinus rhythm  Imaging Studies Performed:  CXR: (2/8) IMPRESSION: 1. No acute findings. 2. Aortic Atherosclerosis (ICD10-I70.0) and Emphysema (ICD10-J43.9).  CT HEAD: (2/8) IMPRESSION 1. No acute intracranial abnormality. 2. Aspects = 10. 3. 5 mm hyperdense lesion along the right tentorium, likely a small meningioma. No associated edema or mass effect.   CTA HEAD AND NECK: (2/8) IMPRESSION 1. Negative  CTA for large vessel occlusion or other emergent finding. 2. 50% atheromatous stenosis about the left carotid bulb/proximal left ICA. 3. Additional mild atheromatous change about the carotid bifurcations and carotid siphons without hemodynamically significant or correctable stenosis.  MRI Brain: (2/8) IMPRESSION: 1. No acute intracranial abnormality. 2. 6 mm meningioma along the right tentorium without edema or mass effect, stable. 3. Otherwise normal brain MRI for age.  MRI  Cervical spine w/o contrast: (2/8) IMPRESSION: 1. Patchy signal abnormality involving the bilateral cord at the level of C3-4, consistent with chronic myelomalacia. This is at the site of previously identified cord infarct. Suspected additional subtle foci of chronic myelomalacia inferiorly at C6-7, slightly worse on the left. No acute cord signal changes or other abnormality. 2. Underlying multilevel cervical spondylosis with resultant mild spinal stenosis at C4-5 through C7-T1. No frank cord impingement. 3. Multifactorial degenerative changes with resultant multilevel foraminal narrowing as above. Notable findings include moderate right C4 foraminal stenosis, severe right C5 foraminal narrowing, severe right worse than left C6 foraminal stenosis, with severe right and moderate left C7 foraminal narrowing.  Camelia Phenes, MD 08/19/2022, 12:49 AM PGY-1, [facility] Intern pager: [phone], text pages welcome Secure chat group [facility] Upper-Level Resident Addendum   I have independently interviewed and examined the patient. I have discussed the above with Dr. Edd Arbour and agree with the documented plan. My edits for correction/addition/clarification are included above. Please see any attending notes.   Alcus Dad, MD PGY-3, [facility] 08/19/2022 6:21 AM  FPTS Service pager: [phone] (text pages welcome through [facility])

## 2022-08-18 NOTE — ED Provider Notes (Addendum)
[facility] Provider Note   CSN: [id number] Arrival date & time: 08/18/22  [id number]  An emergency department physician performed an initial assessment on this suspected stroke patient at 13.  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 male.  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  Patient was brought in by EMS as a code stroke for worse right upper and right lower extremity weakness.  Seems that he had a syncopal episode while at a fish gambling location and seems that his last known normal was around 5:20 PM.  He has a history of syncopal episodes and tells me he has had a seizure before however I do not see notes of this in the past.  He states he has a headache and some neck pain currently but this time states he feels like his extremity weakness is back to its normal/baseline.  He denies any chest pain nausea vomiting diarrhea.  Seems that he may have had recent illness as  his family member describes that he recently was under the weather and had a fever.      Home Medications Prior to Admission medications   Medication Sig Start Date End Date Taking? Authorizing Provider  albuterol (VENTOLIN HFA) 108 (90 Base) MCG/ACT inhaler Inhale 2 puffs into the lungs every 6 (six) hours as needed  for wheezing or shortness of breath. 03/11/22   Ladell Pier, MD  amitriptyline (ELAVIL) 25 MG tablet Take 1 tablet (25 mg total) by mouth at bedtime. 07/12/18   Meredith Staggers, MD  amLODipine (NORVASC) 5 MG tablet TAKE 1 TABLET (5 MG TOTAL) BY MOUTH DAILY. 08/09/22   Ladell Pier, MD  aspirin EC 81 MG tablet Take 1 tablet (81 mg total) by mouth daily. 10/01/19   Ladell Pier, MD  atorvastatin (LIPITOR) 10 MG tablet Take 1 tablet (10 mg total) by mouth daily. 03/09/22   Ladell Pier, MD  baclofen (LIORESAL) 10 MG tablet Take 10 mg by mouth 4 (four) times daily. 02/09/21   [provider]  gabapentin (NEURONTIN) 300 MG capsule TAKE 3 CAPSULES BY MOUTH 3 TIMES A DAY Patient taking differently: Take 900 mg by mouth 3 (three) times daily. 04/28/20   Meredith Staggers, MD  NARCAN 4 MG/0.1ML LIQD nasal spray kit Place 1 spray into the nose as needed (overdose). As needed 09/17/18   [provider]  oxyCODONE-acetaminophen (PERCOCET) 10-325 MG tablet Take 1 tablet by mouth 3 (three) times daily. Patient not taking: Reported on 08/11/2022 10/06/20   [provider]  tiZANidine (ZANAFLEX) 2 MG tablet TAKE 1-2 TABLETS (2-4 MG TOTAL) BY MOUTH 4 (FOUR) TIMES DAILY. (TAKE 2 AT BEDTIME) Patient taking differently: Take 2-4 mg by mouth 4 (four) times daily. 10/12/20   Naaman Plummer,  Celesta Gentile, MD      Allergies    Patient has no known allergies.    Review of Systems   Review of Systems  Physical Exam Updated Vital Signs BP 100/72   Pulse 73   Temp 98.1 F (36.7 C) (Oral)   Resp 15   Ht 6' (1.829 m)   Wt 70.3 kg   SpO2 100%   BMI 21.02 kg/m  Physical Exam Vitals and nursing note reviewed.  Constitutional:      General: He is not in acute distress.    Comments: Chronically ill-appearing 62 year old male.  HENT:     Head: Normocephalic and atraumatic.     Nose: Nose normal.     Mouth/Throat:     Mouth: Mucous membranes are dry.  Eyes:     General: No scleral  icterus. Cardiovascular:     Rate and Rhythm: Normal rate and regular rhythm.     Pulses: Normal pulses.     Heart sounds: Normal heart sounds.  Pulmonary:     Effort: Pulmonary effort is normal. No respiratory distress.     Breath sounds: Normal breath sounds. No wheezing.  Abdominal:     Palpations: Abdomen is soft.     Tenderness: There is no abdominal tenderness. There is no guarding or rebound.  Musculoskeletal:     Cervical back: Normal range of motion.     Right lower leg: No edema.     Left lower leg: No edema.  Skin:    General: Skin is warm and dry.     Capillary Refill: Capillary refill takes less than 2 seconds.  Neurological:     Mental Status: He is alert.     Comments: Alert and oriented  Right upper extremity with significant weakness at the shoulder elbow and grip Right lower extremity with weakness of plantar dorsiflexion of ankle.  Unable to lift leg off bed.  Alert and oriented to self, place, time and event.   Speech is fluent, clear without dysarthria or dysphasia.   Strength 5/5 in upper/lower extremities   Sensation intact in upper/lower extremities   CN I not tested  CN II grossly intact visual fields bilaterally. Did not visualize posterior eye.  CN III, IV, VI PERRLA and EOMs intact bilaterally  CN V Intact sensation to sharp and light touch to the face  CN VII facial movements symmetric  CN VIII not tested  CN IX, X no uvula deviation  CN XI 5/5 SCM and trapezius strength bilaterally  CN XII Midline tongue protrusion, symmetric L/R movements     Psychiatric:        Mood and Affect: Mood normal.        Behavior: Behavior normal.     ED Results / Procedures / Treatments   Labs (all labs ordered are listed, but only abnormal results are displayed) Labs Reviewed  CBC - Abnormal; Notable for the following components:      Result Value   RBC 3.60 (*)    Hemoglobin 9.4 (*)    HCT 29.6 (*)    All other components within normal limits   COMPREHENSIVE METABOLIC PANEL - Abnormal; Notable for the following components:   Potassium 3.1 (*)    CO2 18 (*)    Glucose, Bld 106 (*)    Calcium 8.1 (*)    Total Protein 5.9 (*)    Albumin 3.1 (*)    AST 14 (*)    All other components within normal limits  ETHANOL -  Abnormal; Notable for the following components:   Alcohol, Ethyl (B) 114 (*)    All other components within normal limits  URINALYSIS, W/ REFLEX TO CULTURE (INFECTION SUSPECTED) - Abnormal; Notable for the following components:   Color, Urine STRAW (*)    Specific Gravity, Urine 1.034 (*)    All other components within normal limits  I-STAT CHEM 8, ED - Abnormal; Notable for the following components:   Potassium 3.2 (*)    Glucose, Bld 105 (*)    Calcium, Ion 1.10 (*)    TCO2 19 (*)    Hemoglobin 10.9 (*)    HCT 32.0 (*)    All other components within normal limits  CBG MONITORING, ED - Abnormal; Notable for the following components:   Glucose-Capillary 115 (*)    All other components within normal limits  CBG MONITORING, ED - Abnormal; Notable for the following components:   Glucose-Capillary 105 (*)    All other components within normal limits  RESPIRATORY PANEL BY PCR  RESP PANEL BY RT-PCR (RSV, FLU A&B, COVID)  [id number]  CULTURE, BLOOD (ROUTINE X 2)  CULTURE, BLOOD (ROUTINE X 2)  PROTIME-INR  APTT  DIFFERENTIAL  MAGNESIUM  CK  RAPID URINE DRUG SCREEN, HOSP PERFORMED  LIPID PANEL  HEMOGLOBIN A1C  HIV ANTIBODY (ROUTINE TESTING W REFLEX)  BASIC METABOLIC PANEL  CBC  LACTIC ACID, PLASMA    EKG EKG Interpretation  Date/Time:  Thursday August 18 2022 20:37:13 EST Ventricular Rate:  64 PR Interval:  217 QRS Duration: 84 QT Interval:  471 QTC Calculation: 486 R Axis:   44 Text Interpretation: Sinus rhythm Borderline prolonged PR interval Borderline ST elevation, anterolateral leads Borderline prolonged QT interval When compared with ECG of 07/08/2021, No significant change was found Confirmed by  Delora Fuel ([id number]) on 08/18/2022 11:08:09 PM  Radiology MR CERVICAL SPINE WO CONTRAST  Result Date: 08/18/2022 CLINICAL DATA:  Initial evaluation for acute on chronic myelopathy. Presumed history of spinal cord infarct. EXAM: MRI CERVICAL SPINE WITHOUT CONTRAST TECHNIQUE: Multiplanar, multisequence MR imaging of the cervical spine was performed. No intravenous contrast was administered. COMPARISON:  Previous MRI from 09/01/2016. FINDINGS: Alignment: Examination mildly degraded by motion artifact. Straightening with slight reversal of the normal cervical lordosis. No listhesis. Vertebrae: Vertebral body height maintained without acute or chronic fracture. Bone marrow signal intensity heterogeneous without worrisome osseous lesion. Prominent discogenic reactive endplate change with marrow edema present about the C7-T1 interspace. No other abnormal marrow edema. Cord: Patchy signal abnormality involving the bilateral hemi cord at the level of C3-4 with associated cord atrophy/volume loss, consistent with chronic myelomalacia (series 25, image 17). This is at the site of previously identified presumed cord infarct. Suspected additional subtle foci of chronic myelomalacia of inferiorly at C6-7, slightly worse on the left (series 25, image 33). No acute cord signal changes or other abnormality. Posterior Fossa, vertebral arteries, paraspinal tissues: Craniocervical junction within normal limits. Paraspinous soft tissues normal. Normal flow voids seen within the vertebral arteries bilaterally. Disc levels: C2-C3: Left greater than right uncovertebral spurring without significant disc bulge. Left-sided facet hypertrophy. No spinal stenosis. Mild to moderate left C3 foraminal narrowing. Right neural foramina remains patent. C3-C4: Bilateral uncovertebral spurring without significant disc bulge. Right-sided facet hypertrophy. No significant spinal stenosis. Moderate right with mild left C4 foraminal narrowing. C4-C5:  Broad based right eccentric disc osteophyte complex flattens and partially effaces the ventral thecal sac. Associated right worse than left uncovertebral and facet hypertrophy. Mild spinal stenosis without frank cord impingement.  Severe right C5 foraminal stenosis. Left neural foramen remains patent. C5-C6: Degenerative intervertebral disc space narrowing with diffuse disc osteophyte complex, slightly asymmetric to the right. Broad posterior component flattens and partially effaces the ventral thecal sac. Mild spinal stenosis. Severe right worse than left C6 foraminal narrowing. C6-C7: Degenerative intervertebral disc space narrowing with diffuse disc osteophyte complex. Mild flattening of the ventral thecal sac without significant spinal stenosis. Severe right with moderate left C7 foraminal stenosis. C7-T1: Diffuse disc bulge with uncovertebral spurring. Right greater than left facet and ligament flavum hypertrophy. Mild spinal stenosis. Mild right with moderate left C8 foraminal narrowing. IMPRESSION: 1. Patchy signal abnormality involving the bilateral cord at the level of C3-4, consistent with chronic myelomalacia. This is at the site of previously identified cord infarct. Suspected additional subtle foci of chronic myelomalacia inferiorly at C6-7, slightly worse on the left. No acute cord signal changes or other abnormality. 2. Underlying multilevel cervical spondylosis with resultant mild spinal stenosis at C4-5 through C7-T1. No frank cord impingement. 3. Multifactorial degenerative changes with resultant multilevel foraminal narrowing as above. Notable findings include moderate right C4 foraminal stenosis, severe right C5 foraminal narrowing, severe right worse than left C6 foraminal stenosis, with severe right and moderate left C7 foraminal narrowing. Electronically Signed   By: Jeannine Boga M.D.   On: 08/18/2022 21:40   MR BRAIN WO CONTRAST  Result Date: 08/18/2022 CLINICAL DATA:  Initial  evaluation for neuro deficit, stroke suspected. EXAM: MRI HEAD WITHOUT CONTRAST TECHNIQUE: Multiplanar, multiecho pulse sequences of the brain and surrounding structures were obtained without intravenous contrast. COMPARISON:  Prior CT from earlier the same day as well as previous brain MRI from 11/23/2020. FINDINGS: Brain: Cerebral volume within normal limits. No significant cerebral white matter disease for age. No evidence for acute or subacute ischemia. Gray-white matter differentiation maintained. No areas of chronic cortical infarction. No acute or chronic intracranial blood products. Presumed meningioma measuring up to 6 mm along the right tentorium (series 21, image 13), stable. No associated edema or mass effect. No other mass lesion or midline shift. No hydrocephalus or extra-axial fluid collection. Pituitary gland and suprasellar region within normal limits. Vascular: Major intracranial vascular flow voids are maintained. Skull and upper cervical spine: Craniocervical junction within normal limits. Bone marrow signal intensity normal. No scalp soft tissue abnormality. Sinuses/Orbits: Prior bilateral ocular lens replacement. Paranasal sinuses are largely clear. Trace right mastoid effusion noted, of doubtful significance. Other: 6 mm retention cyst noted at the central nasopharynx. IMPRESSION: 1. No acute intracranial abnormality. 2. 6 mm meningioma along the right tentorium without edema or mass effect, stable. 3. Otherwise normal brain MRI for age. Electronically Signed   By: Jeannine Boga M.D.   On: 08/18/2022 21:30   DG Chest Portable 1 View  Result Date: 08/18/2022 CLINICAL DATA:  Shortness of breath, code stroke EXAM: PORTABLE CHEST 1 VIEW COMPARISON:  07/08/2021 FINDINGS: Atherosclerotic calcification of the aortic arch. Tapering of the peripheral pulmonary vasculature favors emphysema. The lungs appear otherwise clear. No blunting of the costophrenic angles. No significant bony  abnormality. IMPRESSION: 1. No acute findings. 2. Aortic Atherosclerosis (ICD10-I70.0) and Emphysema (ICD10-J43.9). Electronically Signed   By: Van Clines M.D.   On: 08/18/2022 21:28   CT HEAD CODE STROKE WO CONTRAST  Result Date: 08/18/2022 CLINICAL DATA:  Initial evaluation for acute neuro deficit, stroke. EXAM: CT HEAD WITHOUT CONTRAST CT ANGIOGRAPHY HEAD AND NECK TECHNIQUE: Multidetector CT imaging of the head and neck was performed using the standard protocol during  bolus administration of intravenous contrast. Multiplanar CT image reconstructions and MIPs were obtained to evaluate the vascular anatomy. Carotid stenosis measurements (when applicable) are obtained utilizing NASCET criteria, using the distal internal carotid diameter as the denominator. RADIATION DOSE REDUCTION: This exam was performed according to the departmental dose-optimization program which includes automated exposure control, adjustment of the mA and/or kV according to patient size and/or use of iterative reconstruction technique. CONTRAST:  49m OMNIPAQUE IOHEXOL 350 MG/ML SOLN COMPARISON:  Comparison made with prior CT from 07/08/2021. FINDINGS: CT HEAD FINDINGS Brain: Cerebral volume within normal limits. No acute intracranial hemorrhage. No acute large vessel territory infarct. 5 mm hyperdense lesion positioned along the right tentorium, likely a small meningioma. No associated edema or mass effect. No other mass lesion. No midline shift. No hydrocephalus or extra-axial fluid collection. Vascular: No abnormal hyperdense vessel. Skull: Scalp soft tissues and calvarium within normal limits. Sinuses/Orbits: Globes orbital soft tissues demonstrate no acute finding. Paranasal sinuses and mastoid air cells are largely clear. Other: None. ASPECTS ([place]roke Program Early CT Score) - Ganglionic level infarction (caudate, lentiform nuclei, internal capsule, insula, M1-M3 cortex): 7 - Supraganglionic infarction (M4-M6 cortex): 3  Total score (0-10 with 10 being normal): 10 CTA NECK FINDINGS Aortic arch: Visualized aortic arch normal caliber standard branch pattern. No visible stenosis about the origin of the great vessels. Right carotid system: Right common and internal carotid arteries are patent without dissection. Mild atheromatous change about the right carotid bulb/proximal right ICA without hemodynamically significant greater than 50% stenosis. Left carotid system: Left common and internal carotid arteries are patent without dissection. Mild atheromatous change about the left carotid bulb/proximal left ICA with associated stenosis of up to 50% by NASCET criteria (series 7, image 216). Vertebral arteries: Both vertebral arteries arise from subclavian arteries. No proximal subclavian artery stenosis. Vertebral arteries patent without stenosis or dissection. Skeleton: No discrete or worrisome osseous lesions. Mild-to-moderate multilevel cervical spondylosis without high-grade spinal stenosis. Other neck: Cyst no other acute soft tissue abnormality within the neck. Small retention cyst noted at the central nasopharynx. Upper chest: Mild emphysema. Visualized upper chest demonstrates no other acute finding. Review of the MIP images confirms the above findings CTA HEAD FINDINGS Anterior circulation: Mild atheromatous change within the right carotid siphon without hemodynamically significant stenosis. A1 segments patent bilaterally. Normal anterior communicating artery complex. Anterior cerebral arteries patent without stenosis. No M1 stenosis or occlusion. No proximal MCA branch occlusion or high-grade stenosis. Distal MCA branches perfused and symmetric. Posterior circulation: Both V4 segments patent without stenosis. Left vertebral artery dominant. Both PICA patent. Basilar widely patent without stenosis. Superior cerebellar and posterior cerebral arteries patent bilaterally. Venous sinuses: Patent allowing for timing the contrast bolus.  Anatomic variants: None significant.  No aneurysm. Review of the MIP images confirms the above findings IMPRESSION: CT HEAD: 1. No acute intracranial abnormality. 2. Aspects = 10. 3. 5 mm hyperdense lesion along the right tentorium, likely a small meningioma. No associated edema or mass effect. CTA HEAD AND NECK: 1. Negative CTA for large vessel occlusion or other emergent finding. 2. 50% atheromatous stenosis about the left carotid bulb/proximal left ICA. 3. Additional mild atheromatous change about the carotid bifurcations and carotid siphons without hemodynamically significant or correctable stenosis. These results were communicated to Dr. BCurly Shoresat 7:44 pm on 08/18/2022 by text page via the [facility]aging system. Electronically Signed   By: BJeannine BogaM.D.   On: 08/18/2022 19:52   CT ANGIO HEAD NECK W WO CM (CODE  STROKE)  Result Date: 08/18/2022 CLINICAL DATA:  Initial evaluation for acute neuro deficit, stroke. EXAM: CT HEAD WITHOUT CONTRAST CT ANGIOGRAPHY HEAD AND NECK TECHNIQUE: Multidetector CT imaging of the head and neck was performed using the standard protocol during bolus administration of intravenous contrast. Multiplanar CT image reconstructions and MIPs were obtained to evaluate the vascular anatomy. Carotid stenosis measurements (when applicable) are obtained utilizing NASCET criteria, using the distal internal carotid diameter as the denominator. RADIATION DOSE REDUCTION: This exam was performed according to the departmental dose-optimization program which includes automated exposure control, adjustment of the mA and/or kV according to patient size and/or use of iterative reconstruction technique. CONTRAST:  72m OMNIPAQUE IOHEXOL 350 MG/ML SOLN COMPARISON:  Comparison made with prior CT from 07/08/2021. FINDINGS: CT HEAD FINDINGS Brain: Cerebral volume within normal limits. No acute intracranial hemorrhage. No acute large vessel territory infarct. 5 mm hyperdense lesion positioned  along the right tentorium, likely a small meningioma. No associated edema or mass effect. No other mass lesion. No midline shift. No hydrocephalus or extra-axial fluid collection. Vascular: No abnormal hyperdense vessel. Skull: Scalp soft tissues and calvarium within normal limits. Sinuses/Orbits: Globes orbital soft tissues demonstrate no acute finding. Paranasal sinuses and mastoid air cells are largely clear. Other: None. ASPECTS ([place]roke Program Early CT Score) - Ganglionic level infarction (caudate, lentiform nuclei, internal capsule, insula, M1-M3 cortex): 7 - Supraganglionic infarction (M4-M6 cortex): 3 Total score (0-10 with 10 being normal): 10 CTA NECK FINDINGS Aortic arch: Visualized aortic arch normal caliber standard branch pattern. No visible stenosis about the origin of the great vessels. Right carotid system: Right common and internal carotid arteries are patent without dissection. Mild atheromatous change about the right carotid bulb/proximal right ICA without hemodynamically significant greater than 50% stenosis. Left carotid system: Left common and internal carotid arteries are patent without dissection. Mild atheromatous change about the left carotid bulb/proximal left ICA with associated stenosis of up to 50% by NASCET criteria (series 7, image 216). Vertebral arteries: Both vertebral arteries arise from subclavian arteries. No proximal subclavian artery stenosis. Vertebral arteries patent without stenosis or dissection. Skeleton: No discrete or worrisome osseous lesions. Mild-to-moderate multilevel cervical spondylosis without high-grade spinal stenosis. Other neck: Cyst no other acute soft tissue abnormality within the neck. Small retention cyst noted at the central nasopharynx. Upper chest: Mild emphysema. Visualized upper chest demonstrates no other acute finding. Review of the MIP images confirms the above findings CTA HEAD FINDINGS Anterior circulation: Mild atheromatous change  within the right carotid siphon without hemodynamically significant stenosis. A1 segments patent bilaterally. Normal anterior communicating artery complex. Anterior cerebral arteries patent without stenosis. No M1 stenosis or occlusion. No proximal MCA branch occlusion or high-grade stenosis. Distal MCA branches perfused and symmetric. Posterior circulation: Both V4 segments patent without stenosis. Left vertebral artery dominant. Both PICA patent. Basilar widely patent without stenosis. Superior cerebellar and posterior cerebral arteries patent bilaterally. Venous sinuses: Patent allowing for timing the contrast bolus. Anatomic variants: None significant.  No aneurysm. Review of the MIP images confirms the above findings IMPRESSION: CT HEAD: 1. No acute intracranial abnormality. 2. Aspects = 10. 3. 5 mm hyperdense lesion along the right tentorium, likely a small meningioma. No associated edema or mass effect. CTA HEAD AND NECK: 1. Negative CTA for large vessel occlusion or other emergent finding. 2. 50% atheromatous stenosis about the left carotid bulb/proximal left ICA. 3. Additional mild atheromatous change about the carotid bifurcations and carotid siphons without hemodynamically significant or correctable stenosis. These results were  communicated to Dr. Curly Shores at 7:44 pm on 08/18/2022 by text page via the [facility] messaging system. Electronically Signed   By: Jeannine Boga M.D.   On: 08/18/2022 19:52    Procedures .Critical Care  Performed by: Tedd Sias, PA Authorized by: Tedd Sias, PA   Critical care provider statement:    Critical care time (minutes):  35   Critical care time was exclusive of:  Separately billable procedures and treating other patients and teaching time   Critical care was necessary to treat or prevent imminent or life-threatening deterioration of the following conditions:  Sepsis (hypotension/hypothermia/AMS)   Critical care was time spent personally by me on the  following activities:  Development of treatment plan with patient or surrogate, review of old charts, re-evaluation of patient's condition, pulse oximetry, ordering and review of radiographic studies, ordering and review of laboratory studies, ordering and performing treatments and interventions, obtaining history from patient or surrogate, examination of patient and evaluation of patient's response to treatment   Care discussed with: admitting provider       Medications Ordered in ED Medications  lactated ringers infusion ( Intravenous New Bag/Given 08/18/22 2145)  enoxaparin (LOVENOX) injection 40 mg (has no administration in time range)  thiamine (VITAMIN B1) tablet 100 mg (has no administration in time range)  aspirin EC tablet 81 mg (has no administration in time range)  atorvastatin (LIPITOR) tablet 10 mg (has no administration in time range)  baclofen (LIORESAL) tablet 10 mg (has no administration in time range)  gabapentin (NEURONTIN) capsule 300 mg (has no administration in time range)  sodium chloride flush (NS) 0.9 % injection 3 mL (3 mLs Intravenous Given 08/18/22 2036)  iohexol (OMNIPAQUE) 350 MG/ML injection 75 mL (75 mLs Intravenous Contrast Given 08/18/22 1931)  lactated ringers bolus 1,000 mL (0 mLs Intravenous Stopped 08/18/22 2224)  lactated ringers bolus 1,000 mL (0 mLs Intravenous Stopped 08/18/22 2250)    And  lactated ringers bolus 250 mL (0 mLs Intravenous Stopped 08/18/22 2224)  potassium chloride SA (KLOR-CON M) CR tablet 40 mEq (40 mEq Oral Given 08/19/22 0043)    ED Course/ Medical Decision Making/ A&P Clinical Course as of 08/19/22 0056  Thu Aug 18, 2022  1936 Patient is a 63 year old male with past medical history significant for alcohol use disorder, 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  Patient is currently in [place].  CT head without contrast is negative for bleed.  CTA in process.  Did have urinary incontinence.  This begs a question of withdrawal seizure with his history of alcohol use.  Was also noted to have a blood pressure of 82 over palp on initial evaluation.  Per EMS improved to 116/76 with small bolus of fluid. [WF]  2252 Admitted to Dr. Edd Arbour of family medicine teaching service. [WF]    Clinical Course User Index [WF] Tedd Sias, [place]                             Medical Decision Making Amount and/or Complexity of Data Reviewed Labs: ordered. Radiology: ordered.  Risk Prescription drug management. Decision regarding hospitalization.   This patient presents to the ED for concern of code stroke, this involves a number of treatment options, and is  a complaint that carries with it a high risk of complications and morbidity. 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  Patient was brought in by EMS as a code stroke for worse right upper and right lower extremity weakness.  Seems that he had a syncopal episode while at a fish gambling location and seems that his last known normal was around 5:20 PM.  He has a history of syncopal episodes and tells me he has had a seizure before however I do not see notes of this in the past.  He states he has a headache and some neck pain currently but this time states he feels like his extremity weakness is back to its normal/baseline.  He denies any chest pain nausea vomiting diarrhea.  Seems that he may have had recent illness as  his family member describes that he recently was under the  weather and had a fever.    EMR reviewed including pt PMHx, past surgical history and past visits to ER.   See HPI for more details   Lab Tests:  I ordered and independently interpreted labs. Labs notable for    Imaging Studies:  I personally viewed images from CTA head and neck as well as noncontrast CT head, chest x-ray.  MRI brain and C-spine were reviewed by Dr. Isac Sarna of neurology.    Cardiac Monitoring:  The patient was maintained on a cardiac monitor.  I personally viewed and interpreted the cardiac monitored which showed an underlying rhythm of:  NSR EKG non-ischemic   Medicines ordered:  I ordered medication including lactated Ringer's 2 L for fluid resuscitation/hydration.  Will receive a total of 2.25 L.  Maintenance fluids, 2 g of Rocephin for empiric treatment given hypothermia Reevaluation of the patient after these medicines showed that the patient improved I have reviewed the patients home medicines and have made adjustments as needed   Critical Interventions:     Consults/Attending Physician  I discussed this case with my attending physician who cosigned this note including patient's presenting symptoms, physical exam, and planned diagnostics and interventions. Attending physician stated agreement with plan or made changes to plan which were implemented.   Attending physician assessed patient at bedside.   I requested consultation with Dr. Curly Shores,  and discussed lab and imaging findings as well as pertinent plan - they recommend: Continue medical workup [place] stroke is not found on imaging and not suspected.  She recommends against antiepileptics at this time  Discussed with Dr. Edd Arbour of family medicine teaching service will admit.    Reevaluation:  After the interventions noted above I re-evaluated patient and found that they have :stayed the same   Social Determinants of Health:      Problem List / ED Course:  Syncopal episode and perhaps  worsening/recrudescence of his right-sided weakness perhaps in the setting of sepsis with hypotension hypothermia perhaps also could be related to hypothermia.  Currently on Bair hugger for hypothermia and receiving fluid resuscitation.   Dispostion:  After consideration of the diagnostic results and the patients response to treatment, I feel that the patent would benefit from admission  Final Clinical Impression(s) / ED Diagnoses Final diagnoses:  Hypotension, unspecified hypotension type  Syncope, unspecified  syncope type  Hypothermia, initial encounter  Alcoholic intoxication without complication [facility])    Rx / DC Orders ED Discharge Orders     None         Tedd Sias, [place] 08/19/22 0055    Tedd Sias, PA 08/19/22 0056    Jeanell Sparrow, DO 08/19/22 1705

## 2022-08-18 NOTE — Assessment & Plan Note (Addendum)
Most likely due to dehydration vs polysubstance use complicated by multiple medications such as gabapentin, buspar. Could have also been withdrawing from baclofen; though patient would would more likely have hyperthermia instead of hypothermia. Could also be some component of autonomic dysfunction related to his spinal cord injury but this is less likely. - neurology consulted, appreciate recs - Follow troponins  - F/u ECHO, EEG  - fall precautions

## 2022-08-18 NOTE — ED Notes (Signed)
Patient placed on barehuggar at this time.

## 2022-08-19 ENCOUNTER — Observation Stay (HOSPITAL_COMMUNITY): Payer: Medicare HMO

## 2022-08-19 ENCOUNTER — Observation Stay (HOSPITAL_BASED_OUTPATIENT_CLINIC_OR_DEPARTMENT_OTHER): Payer: Medicare HMO

## 2022-08-19 DIAGNOSIS — I959 Hypotension, unspecified: Secondary | ICD-10-CM | POA: Diagnosis present

## 2022-08-19 DIAGNOSIS — R55 Syncope and collapse: Secondary | ICD-10-CM

## 2022-08-19 LAB — LIPID PANEL
Cholesterol: 153 mg/dL (ref 0–200)
HDL: 50 mg/dL (ref >40)
LDL Cholesterol: 75 mg/dL (ref 0–99)
Total CHOL/HDL Ratio: 3.1 RATIO
Triglycerides: 138 mg/dL (ref <150)
VLDL: 28 mg/dL (ref 0–40)

## 2022-08-19 LAB — ECHOCARDIOGRAM COMPLETE
AR max vel: 2.67 cm2
AV Area VTI: 2.84 cm2
AV Area mean vel: 2.68 cm2
AV Mean grad: 3 mmHg
AV Peak grad: 6.2 mmHg
Ao pk vel: 1.24 m/s
Area-P 1/2: 2.66 cm2
Height: 72 in
S' Lateral: 2.7 cm
Weight: 2480 oz

## 2022-08-19 LAB — URINALYSIS, W/ REFLEX TO CULTURE (INFECTION SUSPECTED)
Bacteria, UA: NONE SEEN
Bilirubin Urine: NEGATIVE
Glucose, UA: NEGATIVE mg/dL
Hgb urine dipstick: NEGATIVE
Ketones, ur: NEGATIVE mg/dL
Leukocytes,Ua: NEGATIVE
Nitrite: NEGATIVE
Protein, ur: NEGATIVE mg/dL
Specific Gravity, Urine: 1.034 — ABNORMAL HIGH (ref 1.005–1.030)
pH: 6 (ref 5.0–8.0)

## 2022-08-19 LAB — RESPIRATORY PANEL BY PCR

## 2022-08-19 LAB — BASIC METABOLIC PANEL
Anion gap: 11 (ref 5–15)
BUN: 7 mg/dL — ABNORMAL LOW (ref 8–23)
CO2: 20 mmol/L — ABNORMAL LOW (ref 22–32)
Calcium: 8.2 mg/dL — ABNORMAL LOW (ref 8.9–10.3)
Chloride: 105 mmol/L (ref 98–111)
Creatinine, Ser: 0.79 mg/dL (ref 0.61–1.24)
GFR, Estimated: >60 mL/min (ref >60)
Glucose, Bld: 84 mg/dL (ref 70–99)
Potassium: 3.9 mmol/L (ref 3.5–5.1)
Sodium: 136 mmol/L (ref 135–145)

## 2022-08-19 LAB — CBC
HCT: 29.5 % — ABNORMAL LOW (ref 39.0–52.0)
Hemoglobin: 9.1 g/dL — ABNORMAL LOW (ref 13.0–17.0)
MCH: 25.9 pg — ABNORMAL LOW (ref 26.0–34.0)
MCHC: 30.8 g/dL (ref 30.0–36.0)
MCV: 84 fL (ref 80.0–100.0)
Platelets: 244 10*3/uL (ref 150–400)
RBC: 3.51 MIL/uL — ABNORMAL LOW (ref 4.22–5.81)
RDW: 13.5 % (ref 11.5–15.5)
WBC: 5.3 10*3/uL (ref 4.0–10.5)
nRBC: 0 % (ref 0.0–0.2)

## 2022-08-19 LAB — RAPID URINE DRUG SCREEN, HOSP PERFORMED
Amphetamines: NOT DETECTED
Barbiturates: NOT DETECTED
Benzodiazepines: NOT DETECTED
Cocaine: POSITIVE — AB
Opiates: NOT DETECTED
Tetrahydrocannabinol: POSITIVE — AB

## 2022-08-19 LAB — TROPONIN I (HIGH SENSITIVITY)
Troponin I (High Sensitivity): 53 ng/L — ABNORMAL HIGH (ref <18)
Troponin I (High Sensitivity): 59 ng/L — ABNORMAL HIGH (ref <18)

## 2022-08-19 LAB — HEMOGLOBIN A1C
Hgb A1c MFr Bld: 4.8 % (ref 4.8–5.6)
Mean Plasma Glucose: 91.06 mg/dL

## 2022-08-19 LAB — LACTIC ACID, PLASMA
Lactic Acid, Venous: 3 mmol/L (ref 0.5–1.9)
Lactic Acid, Venous: 1.7 mmol/L (ref 0.5–1.9)

## 2022-08-19 MED ORDER — ADULT MULTIVITAMIN W/MINERALS CH
1.0000 | ORAL_TABLET | Freq: Every day | ORAL | Status: DC
  Administered 2022-08-19 – 2022-08-20 (×2): 1 via ORAL
  Filled 2022-08-19 (×2): qty 1

## 2022-08-19 MED ORDER — BACLOFEN 10 MG PO TABS
10.0000 mg | ORAL_TABLET | Freq: Three times a day (TID) | ORAL | Status: DC
  Administered 2022-08-19 – 2022-08-20 (×4): 10 mg via ORAL
  Filled 2022-08-19 (×4): qty 1

## 2022-08-19 MED ORDER — GABAPENTIN 300 MG PO CAPS: 900.0000 mg | ORAL_CAPSULE | Freq: Three times a day (TID) | ORAL | Status: DC

## 2022-08-19 MED ORDER — ATORVASTATIN CALCIUM 10 MG PO TABS
10.0000 mg | ORAL_TABLET | Freq: Every day | ORAL | Status: DC
  Administered 2022-08-19 – 2022-08-20 (×2): 10 mg via ORAL
  Filled 2022-08-19 (×2): qty 1

## 2022-08-19 MED ORDER — ASPIRIN 81 MG PO TBEC
81.0000 mg | DELAYED_RELEASE_TABLET | Freq: Every day | ORAL | Status: DC
  Administered 2022-08-19 – 2022-08-20 (×2): 81 mg via ORAL
  Filled 2022-08-19 (×2): qty 1

## 2022-08-19 MED ORDER — GABAPENTIN 300 MG PO CAPS
900.0000 mg | ORAL_CAPSULE | Freq: Three times a day (TID) | ORAL | Status: DC
  Administered 2022-08-19 – 2022-08-20 (×3): 900 mg via ORAL
  Filled 2022-08-19 (×3): qty 3

## 2022-08-19 MED ORDER — FOLIC ACID 1 MG PO TABS
1.0000 mg | ORAL_TABLET | Freq: Every day | ORAL | Status: DC
  Administered 2022-08-19 – 2022-08-20 (×2): 1 mg via ORAL
  Filled 2022-08-19 (×2): qty 1

## 2022-08-19 MED ORDER — GABAPENTIN 300 MG PO CAPS
300.0000 mg | ORAL_CAPSULE | Freq: Three times a day (TID) | ORAL | Status: DC
  Administered 2022-08-19: 300 mg via ORAL
  Filled 2022-08-19: qty 1

## 2022-08-19 MED ORDER — BACLOFEN 10 MG PO TABS: 10.0000 mg | ORAL_TABLET | Freq: Four times a day (QID) | ORAL | Status: DC | PRN

## 2022-08-19 NOTE — H&P (Incomplete)
Hospital Admission History and Physical Service Pager: [phone]  Patient name: Patrick Romero Medical record number: [id number] Date of Birth: Aug 26, 1960 Age: 62 y.o. Gender: male  Primary Care Provider: Ladell Pier, MD Consultants: Neuro Code Status: Full Preferred Emergency Contact: Edis Huish (mother) [phone] Lui Bellis [phone]  Chief Complaint: syncope, worsening R sided weakness  Assessment and Plan: Patrick Romero is a 62 y.o. male presenting with syncopal episode in the setting of alcohol intoxication. Differential for presentation of this includes orthostatic hypotension, cardiac arrhythmia, and situational syncope.   * Syncope Multiple similar presentations of syncopal episodes in the setting of alcohol intoxication - admit to FMTS telemetry attending Dr. Andria Frames    FEN/GI: *** VTE Prophylaxis: ***  Disposition: med-tele  History of Present Illness:  Patrick Romero is a 62 y.o. male presenting with syncopal episode.  Patient was at the game hall sitting on a barstool when he had a witnessed syncopal episode. Believes some staff members called EMS. Thinks he hit his he Reports he was drinking alcohol, had 24oz beer. Endorses prior history of syncopal episodes, he thinks since 2018, sometimes when drinking alcohol, sometimes in the absence of alcohol. He does not think it's related.  States he drinks alcohol 3-4 days per week, usually 2-3 beers at a time. Denies history of alcohol withdrawal to his knowledge.  Denies chest pain, shortness of breath, abdominal pain, nausea, vomiting.  Earlier this week had fever, chills, cough, headache, and vomiting for a few days. This resolved after 5-6 days.  EMS initially hypotensive   In the ED, patient was seen by neurology. Imaging rules out stroke.  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   Pertinent Past Surgical History: Partial nephrectomy 2019 Prostatectomy Remainder reviewed in history tab.   Pertinent Social History: Tobacco use: Yes, 0.5-1PPD since age 82 Alcohol use: Yes, "3-4 days per week, 2-3 drinks per occasion" Other Substance use: Marijuana occasionally; has used cocaine before but not regularly. Was in rehab at some point for narcotics? Lives with ***  Pertinent Family History: Sister diabetes  Remainder reviewed in history tab.   Important Outpatient Medications: Gabapentin '300mg'$  three times daily Baclofen '10mg'$  TID Amlodipine '5mg'$  daily ASA '81mg'$  daily Atorvastatin '10mg'$  daily Remainder reviewed in medication history.   Objective: BP 100/68   Pulse 72   Temp (!) 95.1 F (35.1 C) (Rectal)   Resp 14   Ht 6' (1.829 m)   Wt 70.3 kg   SpO2 97%   BMI 21.02 kg/m  Exam: General: *** Eyes: *** ENTM: *** Neck: *** Cardiovascular: *** Respiratory: *** Gastrointestinal: *** MSK: *** Derm: *** Neuro: *** Psych: ***  Labs:  CBC BMET  Recent Labs  Lab 08/18/22 1919 08/18/22 1926  WBC 6.4  --   HGB 9.4* 10.9*  HCT 29.6* 32.0*  PLT 275  --    Recent Labs  Lab 08/18/22 1919 08/18/22 1926  NA 136 140  K 3.1* 3.2*  CL 108 108  CO2 18*  --   BUN 10 9  CREATININE 1.00 1.10  GLUCOSE 106* 105*  CALCIUM 8.1*  --      EKG: sinus rhythm  Imaging Studies Performed:  CXR: (2/8) IMPRESSION: 1. No acute findings. 2. Aortic Atherosclerosis (ICD10-I70.0) and Emphysema (ICD10-J43.9).  CT HEAD: (2/8)  IMPRESSION 1. No acute intracranial abnormality. 2. Aspects = 10. 3. 5 mm hyperdense lesion along the right tentorium, likely a small meningioma. No associated edema or mass effect.   CTA HEAD AND NECK: (2/8) IMPRESSION 1. Negative CTA for large vessel occlusion or other emergent finding. 2. 50% atheromatous stenosis about the left  carotid bulb/proximal left ICA. 3. Additional mild atheromatous change about the carotid bifurcations and carotid siphons without hemodynamically significant or correctable stenosis.  MRI Brain: (2/8) IMPRESSION: 1. No acute intracranial abnormality. 2. 6 mm meningioma along the right tentorium without edema or mass effect, stable. 3. Otherwise normal brain MRI for age.  MRI Cervical spine w/o contrast: (2/8) IMPRESSION: 1. Patchy signal abnormality involving the bilateral cord at the level of C3-4, consistent with chronic myelomalacia. This is at the site of previously identified cord infarct. Suspected additional subtle foci of chronic myelomalacia inferiorly at C6-7, slightly worse on the left. No acute cord signal changes or other abnormality. 2. Underlying multilevel cervical spondylosis with resultant mild spinal stenosis at C4-5 through C7-T1. No frank cord impingement. 3. Multifactorial degenerative changes with resultant multilevel foraminal narrowing as above. Notable findings include moderate right C4 foraminal stenosis, severe right C5 foraminal narrowing, severe right worse than left C6 foraminal stenosis, with severe right and moderate left C7 foraminal narrowing.  Camelia Phenes, MD 08/18/2022, 11:00 PM PGY-1, [place] Intern pager: [phone], text pages welcome Secure chat group [place]

## 2022-08-19 NOTE — ED Notes (Signed)
Patient ambulating in the hallway with physical therapy

## 2022-08-19 NOTE — Assessment & Plan Note (Deleted)
Presented as code stroke due to R sided weakness. Unclear if this was acute as patient has baseline R sided weakness from prior spinal cord injury. Imaging negative for acute stroke. Neuro exam appears at baseline. -f/u EEG  (overall low concern for seizure)

## 2022-08-19 NOTE — Progress Notes (Signed)
EEG complete - results pending 

## 2022-08-19 NOTE — Assessment & Plan Note (Deleted)
Initial temp 93.8, now improving. Unclear cause. No other signs/symptoms to suggest infectious etiology - continue active rewarming (bairhugger started in ED) - monitor temp - s/p Ceftriaxone 2g in the ED

## 2022-08-19 NOTE — Assessment & Plan Note (Addendum)
Resolved. Now hypertensive after receiving bolus and IV fluids. Currently holding home amlodipine as patient is only mildly elevated.  - Continue to monitor

## 2022-08-19 NOTE — Assessment & Plan Note (Addendum)
Denies prior history of withdrawal symptoms. Does not believe his alcohol use is a problem (pre-contemplative stage of change at this time) - CIWA monitoring q8h without Ativan for now - continue gabapentin 900 mg TID - thiamine 100 mg daily, folate, MVI

## 2022-08-19 NOTE — ED Notes (Signed)
Dr. Andria Frames notified of troponin of 53

## 2022-08-19 NOTE — Evaluation (Signed)
Physical Therapy Evaluation Patient Details Name: Patrick Romero MRN: [id number] DOB: July 08, 1961 Today's Date: 08/19/2022  History of Present Illness  Pt is a 62 y/o male who presented with syncopal episode d/t dehydration in setting of alcohol use and recent illness w/ GI/fever. PMH: HTN, cervical spine ischemic event vs central cord syndrome (2019 per p), COPD, prostate cancer, sickle cell trait   Clinical Impression  Patrick Romero is 62 y.o. male admitted with above HPI and diagnosis. Patient is currently limited by functional impairments below (see PT problem list). Patient lives with his family and is independent with SPC at baseline. Overall pt is mobilizing at mod independent to supervision level and demonstrates safe management of SPC for gait. During arrival to ED pt reports his cane has been lost and he is hoping it can be tracked down. If SPC is no discovered by staff or family, pt will need a replacement SPC. Patient will benefit from continued skilled PT interventions to address impairments and progress independence with mobility, recommending return home with assist from family when medically ready, anticipate not PT follow up needs. Acute PT will follow and progress as able.        Recommendations for follow up therapy are one component of a multi-disciplinary discharge planning process, led by the attending physician.  Recommendations may be updated based on patient status, additional functional criteria and insurance authorization.  Follow Up Recommendations No PT follow up      Assistance Recommended at Discharge PRN  Patient can return home with the following  A little help with walking and/or transfers;A little help with bathing/dressing/bathroom;Assistance with cooking/housework;Assist for transportation;Help with stairs or ramp for entrance    Equipment Recommendations Cane (Pt had his [facility] when he arrived to hospital but it is no longer with him or in his room. Will need  a replacement SPC.)  Recommendations for Other Services       Functional Status Assessment Patient has had a recent decline in their functional status and demonstrates the ability to make significant improvements in function in a reasonable and predictable amount of time.     Precautions / Restrictions Precautions Precautions: Other (comment) Precaution Comments: relatively low fall risk, minor baseline R sided deficits Restrictions Weight Bearing Restrictions: No      Mobility  Bed Mobility Overal bed mobility: Modified Independent                  Transfers Overall transfer level: Independent Equipment used: None                    Ambulation/Gait Ambulation/Gait assistance: Supervision Gait Distance (Feet): 250 Feet Assistive device: Straight cane Gait Pattern/deviations: Step-through pattern, Decreased stride length, Decreased dorsiflexion - right, Shuffle Gait velocity: decr     General Gait Details: overall pt steady with no LOB, good sequencing of SPC position with no cues needed for safe step pattern with cane in Lt UE. supervision for safety.  Stairs            Wheelchair Mobility    Modified Rankin (Stroke Patients Only)       Balance Overall balance assessment: Mild deficits observed, not formally tested                                           Pertinent Vitals/Pain Pain Assessment Pain Assessment: No/denies pain  Home Living Family/patient expects to be discharged to:: Private residence Living Arrangements: Parent;Other relatives;Children Available Help at Discharge: Family;Friend(s);Available 24 hours/day Type of Home: House Home Access: Stairs to enter Entrance Stairs-Rails: Right Entrance Stairs-Number of Steps: 3   Home Layout: One level Home Equipment: Cane - single point      Prior Function Prior Level of Function : Independent/Modified Independent;Driving             Mobility Comments:  use of straight cane for mobility. reports last fall 1 year ago ADLs Comments: Ind with ADLs/IADLs, drives when needed, reports able to manage daily tasks despite R sided deficits, has been to OP OT and has squeeze ball, putty, exerc to work with R hand     Hand Dominance   Dominant Hand: Right    Extremity/Trunk Assessment   Upper Extremity Assessment Upper Extremity Assessment: Defer to OT evaluation RUE Deficits / Details: minor difficulties with grip strength/grasp and reports occasional wrist pain. elbow and shoulder ROM WFL. RUE Coordination: decreased fine motor    Lower Extremity Assessment Lower Extremity Assessment: Overall WFL for tasks assessed;RLE deficits/detail;LLE deficits/detail RLE Deficits / Details: good hip flexion 4/5, knee ext 4/5, knee flex 4-/5, and dorsiflexion 4-/5, platarflexion 3+/5. LLE Deficits / Details: grossly >4/5 throughout LE    Cervical / Trunk Assessment Cervical / Trunk Assessment: Normal  Communication   Communication: No difficulties  Cognition Arousal/Alertness: Awake/alert Behavior During Therapy: WFL for tasks assessed/performed Overall Cognitive Status: Within Functional Limits for tasks assessed                                          General Comments General comments (skin integrity, edema, etc.): Inquired more regarding the syncopal episodes and pt reports this has happened before his spinal cord stroke even occurred. reports head starts hurting, neck/back becomes achey, dark spots in vision and lightheaded    Exercises     Assessment/Plan    PT Assessment Patient needs continued PT services  PT Problem List Decreased strength;Decreased mobility;Decreased balance;Decreased activity tolerance;Decreased knowledge of use of DME;Decreased coordination       PT Treatment Interventions DME instruction;Gait training;Stair training;Functional mobility training;Therapeutic activities;Therapeutic exercise;Balance  training;Neuromuscular re-education;Cognitive remediation;Patient/family education    PT Goals (Current goals can be found in the Care Plan section)  Acute Rehab PT Goals Patient Stated Goal: return home to family PT Goal Formulation: With patient Time For Goal Achievement: 09/02/22 Potential to Achieve Goals: Good    Frequency Min 3X/week     Co-evaluation               AM-PAC PT "6 Clicks" Mobility  Outcome Measure Help needed turning from your back to your side while in a flat bed without using bedrails?: None Help needed moving from lying on your back to sitting on the side of a flat bed without using bedrails?: None Help needed moving to and from a bed to a chair (including a wheelchair)?: A Little Help needed standing up from a chair using your arms (e.g., wheelchair or bedside chair)?: A Little Help needed to walk in hospital room?: A Little Help needed climbing 3-5 steps with a railing? : A Little 6 Click Score: 20    End of Session Equipment Utilized During Treatment: Gait belt Activity Tolerance: Patient tolerated treatment well Patient left: in bed;with call bell/phone within reach Nurse Communication: Mobility status PT Visit  Diagnosis: Muscle weakness (generalized) (M62.81);Difficulty in walking, not elsewhere classified (R26.2);Unsteadiness on feet (R26.81);Other abnormalities of gait and mobility (R26.89)    Time: [id number] PT Time Calculation (min) (ACUTE ONLY): 14 min   Charges:   PT Evaluation $PT Eval Low Complexity: 1 Low          Verner Mould, DPT Acute Rehabilitation Services Office [phone]  08/19/22 9:38 AM  Patrick Romero 08/19/2022, 9:37 AM

## 2022-08-19 NOTE — Hospital Course (Signed)
Patrick Romero is a 62 y.o.male with a history of HTN, Central Cord syndrome, polysubstance use disorder who was admitted to the [facility] Medicine Teaching Service at [facility] for syncope. His hospital course is detailed below:  Syncope  Patient presented after having a syncopal episode. On presentation patient was hypotensive and hypothermic requiring bear hugger. Patient received 1 L bolus in the ED. Home hypertension medications were held and he received one dose of ceftriaxone. Neurology was consulted. Patient was continued on his home gabapentin and baclofen. He had run out of baclofen three days before episode started. Episode thought to be multifactorial due to dehydration, autonomic instability from central cord syndrome, and polysubstance use. EEG was normal without evidence of seizure. CTA showed 50 % stenosis of L ICA. No evidence of stroke on CT Head. UDS positive for alcohol, THC, and cocaine. Patient was dehydrated as he had recently had GI illness and had been drinking alcohol. ECG x2 were normal except for slightly prolonged repolarization. Troponin was 53, 56 most likely elevated slightly in the setting of hypotension. ECHO ***.   Hypokalemia  K was 3.1 on admission. Increased to 3.9 by second day with repletion and remained stable. Most likely due to GI losses earlier during GI illness and then poor po intake.   Anemia  Patient has history of anemia and had hgb of 9.4 on admission. No evidence of acute blood loss. Patient is up to date on colon cancer screening.   PCP Follow-up Recommendations: F/u CBC and consider iron panel

## 2022-08-19 NOTE — Assessment & Plan Note (Addendum)
Hgb 9.1. No acute blood loss. Patient has stable vital signs at this time.  - Iron panel  - AM CBC

## 2022-08-19 NOTE — Progress Notes (Signed)
  Echocardiogram 2D Echocardiogram has been performed.  Patrick Romero 08/19/2022, 4:38 PM

## 2022-08-19 NOTE — ED Notes (Signed)
Pt resting in bed with no acute distress at this time.

## 2022-08-19 NOTE — ED Notes (Signed)
Patient taken off of bare hugger.

## 2022-08-19 NOTE — ED Notes (Signed)
Critical Lactic reported to MD at this time.

## 2022-08-19 NOTE — Plan of Care (Signed)
EEG normal. Plan as in consult note from Dr. Curly Shores. Please call with questions.  -- Amie Portland, MD Neurologist [facility] Pager: [phone]

## 2022-08-19 NOTE — Evaluation (Signed)
Occupational Therapy Evaluation/Discharge Patient Details Name: Patrick Romero MRN: [id number] DOB: February 29, 1961 Today's Date: 08/19/2022   History of Present Illness Pt is a 62 y/o male who presented with syncopal episode d/t dehydration in setting of alcohol use and recent illness w/ GI/fever. 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 Pt presents now reportedly feeling much better than on admission and close to baseline. Pt able to mobilize around unit with min guard for safety as pt cane missing in ED. Pt able to manage ADLs with assist only for lines, denies any recent falls and familiar with strategies/exercises for R UE in setting of minor baseline deficits. Pt denies dizziness or concerns with ADL/mobility mgmt and ready to DC home once medically cleared. OT to sign off at acute level with no needs at DC.  BP supine: 140/96 BP sitting: 128/100 BP standing: 115/80 BP post ambulation: 128/86      Recommendations for follow up therapy are one component of a multi-disciplinary discharge planning process, led by the attending physician.  Recommendations may be updated based on patient status, additional functional criteria and insurance authorization.   Follow Up Recommendations  No OT follow up     Assistance Recommended at Discharge PRN  Patient can return home with the following      Functional Status Assessment  Patient has not had a recent decline in their functional status  Equipment Recommendations  None recommended by OT    Recommendations for Other Services       Precautions / Restrictions Precautions Precautions: Other (comment) Precaution Comments: relatively low fall risk, minor baseline R sided deficits Restrictions Weight Bearing  Restrictions: No      Mobility Bed Mobility Overal bed mobility: Modified Independent                  Transfers Overall transfer level: Independent Equipment used: None                      Balance Overall balance assessment: Mild deficits observed, not formally tested (baseline, also did not have cane present)                                         ADL either performed or assessed with clinical judgement   ADL Overall ADL's : At baseline                                       General ADL Comments: able to manage gown in standing, ambulate around unit with min guard for safety though no overt LOB. Pt reports he came in with his cane but is currently missing after room changes. denies any new concerns managing daily tasks, able to report safety precautions when/if an episode like this is felt again (has a warning sign, was able to tell his friend and sit down)     Vision Ability to See in Adequate Light: 0 Adequate Patient Visual Report: No change from baseline Vision Assessment?: No apparent visual deficits     Perception     Praxis  Pertinent Vitals/Pain Pain Assessment Pain Assessment: No/denies pain     Hand Dominance Right   Extremity/Trunk Assessment Upper Extremity Assessment Upper Extremity Assessment: RUE deficits/detail RUE Deficits / Details: minor difficulties with grip strength/grasp and reports occasional wrist pain. elbow and shoulder ROM WFL. RUE Coordination: decreased fine motor   Lower Extremity Assessment Lower Extremity Assessment: Defer to PT evaluation   Cervical / Trunk Assessment Cervical / Trunk Assessment: Normal   Communication Communication Communication: No difficulties   Cognition Arousal/Alertness: Awake/alert Behavior During Therapy: WFL for tasks assessed/performed Overall Cognitive Status: Within Functional Limits for tasks assessed                                        General Comments  Inquired more regarding the syncopal episodes and pt reports this has happened before his spinal cord stroke even occurred. reports head starts hurting, neck/back becomes achey, dark spots in vision and lightheaded    Exercises     Shoulder Instructions      Home Living Family/patient expects to be discharged to:: Private residence Living Arrangements: Parent;Other relatives;Children Available Help at Discharge: Family;Friend(s);Available 24 hours/day Type of Home: House Home Access: Stairs to enter [employer] of Steps: 3 Entrance Stairs-Rails: Right Home Layout: One level               Home Equipment: Cane - single point          Prior Functioning/Environment Prior Level of Function : Independent/Modified Independent;Driving             Mobility Comments: use of straight cane for mobility. reports last fall 1 year ago ADLs Comments: Ind with ADLs/IADLs, drives when needed, reports able to manage daily tasks despite R sided deficits, has been to OP OT and has squeeze ball, putty, exerc to work with R hand        OT Problem List:        OT Treatment/Interventions:      OT Goals(Current goals can be found in the care plan section) Acute Rehab OT Goals Patient Stated Goal: home soon OT Goal Formulation: All assessment and education complete, DC therapy  OT Frequency:      Co-evaluation              AM-PAC OT "6 Clicks" Daily Activity     Outcome Measure Help from another person eating meals?: None Help from another person taking care of personal grooming?: None Help from another person toileting, which includes using toliet, bedpan, or urinal?: None Help from another person bathing (including washing, rinsing, drying)?: None Help from another person to put on and taking off regular upper body clothing?: None Help from another person to put on and taking off regular lower body clothing?: None 6 Click  Score: 24   End of Session Nurse Communication: Mobility status;Other (comment) (BP WFL)  Activity Tolerance: Patient tolerated treatment well Patient left: in bed;with call bell/phone within reach;Other (comment) (pharmacy entering)  OT Visit Diagnosis: Other abnormalities of gait and mobility (R26.89)                Time: [id number] OT Time Calculation (min): 22 min Charges:  OT General Charges $OT Visit: 1 Visit OT Evaluation $OT Eval Low Complexity: 1 Low  Malachy Chamber, OTR/L Acute Rehab Services Office: [phone]   Layla Maw 08/19/2022, 8:09 AM

## 2022-08-19 NOTE — Plan of Care (Signed)

## 2022-08-19 NOTE — Assessment & Plan Note (Deleted)
On admission K 3.2. - given 40 mEq K - am BMP

## 2022-08-19 NOTE — Progress Notes (Signed)
Daily Progress Note Intern Pager: [phone]  Patient name: Patrick Romero Medical record number: [id number] Date of birth: 1961-02-22 Age: 62 y.o. Gender: male  Primary Care Provider: Ladell Pier, MD Consultants: Neurology  Code Status: Full   Pt Overview and Major Events to Date:  2/8 - Admitted   Assessment and Plan: Patrick Romero is a 62 y.o. male who presented after a syncopal episode most likely due to polypharmacy and polysubstance use.    PMH includes HTN, Central cord syndrome, polysubstance use disorder  * Syncope Most likely due to dehydration vs polysubstance use complicated by multiple medications such as gabapentin, buspar. Could have also been withdrawing from baclofen; though patient would would more likely have hyperthermia instead of hypothermia. Could also be some component of autonomic dysfunction related to his spinal cord injury but this is less likely. - neurology consulted, appreciate recs - Follow troponins  - F/u ECHO, EEG  - fall precautions   Hypotension Resolved. Now hypertensive after receiving bolus and IV fluids. Currently holding home amlodipine as patient is only mildly elevated.  - Continue to monitor   Hypothermia Initial temp 93.8, now improving. Unclear cause. No other signs/symptoms to suggest infectious etiology - continue active rewarming (bairhugger started in ED) - monitor temp - s/p Ceftriaxone 2g in the ED  Alcohol intoxication [facility]) Denies prior history of withdrawal symptoms. Does not believe his alcohol use is a problem (pre-contemplative stage of change at this time) - CIWA monitoring q8h without Ativan for now - continue gabapentin 900 mg TID - thiamine 100 mg daily, folate, MVI  Anemia Hgb 9.1. No acute blood loss. Patient has stable vital signs at this time.  - Iron panel  - AM CBC    FEN/GI: Heart healthy  PPx: lovenox  Dispo:Home pending completion of evaluation .   Subjective:  Patient reports he  feels well and does not feel different from baseline. Says that he smoked a blunt right before his episode. Says that   Objective: Temp:  [93.8 F (34.3 C)-98.5 F (36.9 C)] 98.5 F (36.9 C) (02/09 0946) Pulse Rate:  [62-84] 66 (02/09 1200) Resp:  [13-25] 16 (02/09 1200) BP: (92-152)/(67-99) 152/99 (02/09 1200) SpO2:  [97 %-100 %] 100 % (02/09 1200) Weight:  [67.6 kg-70.3 kg] 70.3 kg (02/08 2032) Physical Exam: General: Well appearing, sitting up in bed  Cardiovascular: RRR, cap refill < 2 seconds Respiratory: No increased work of breathing, CTAB Abdomen: Soft, non tender, non distended Extremities: No BLE  Neuro: A&O x 4, PERRLA, EOMI, Upper right extremity strength EVER so slightly decreased from left 4/5, BLE strength 5/5, rapid alternating movements in tact   Laboratory: Most recent CBC Lab Results  Component Value Date   WBC 5.3 08/19/2022   HGB 9.1 (L) 08/19/2022   HCT 29.5 (L) 08/19/2022   MCV 84.0 08/19/2022   PLT 244 08/19/2022   Most recent BMP    Latest Ref Rng & Units 08/19/2022    1:21 AM  BMP  Glucose 70 - 99 mg/dL 84   BUN 8 - 23 mg/dL 7   Creatinine 0.61 - 1.24 mg/dL 0.79   Sodium 135 - 145 mmol/L 136   Potassium 3.5 - 5.1 mmol/L 3.9   Chloride 98 - 111 mmol/L 105   CO2 22 - 32 mmol/L 20   Calcium 8.9 - 10.3 mg/dL 8.2     Other pertinent labs lactic acid 1.7    CT Head code stroke  1.  No acute intracranial abnormality. 2. Aspects = 10. 3. 5 mm hyperdense lesion along the right tentorium, likely a small meningioma. No associated edema or mass effect.  CTA Head and Neck  1. Negative CTA for large vessel occlusion or other emergent finding. 2. 50% atheromatous stenosis about the left carotid bulb/proximal left ICA. 3. Additional mild atheromatous change about the carotid bifurcations and carotid siphons without hemodynamically significant or correctable stenosis.  CXR 1. No acute findings. 2. Aortic Atherosclerosis (ICD10-I70.0) and Emphysema  (ICD10-J43.9).  MR Brain wo Contrast 1. No acute intracranial abnormality. 2. 6 mm meningioma along the right tentorium without edema or mass effect, stable. 3. Otherwise normal brain MRI for age.  MR Cervical Spine Wo Contrast 1. Patchy signal abnormality involving the bilateral cord at the level of C3-4, consistent with chronic myelomalacia. This is at the site of previously identified cord infarct. Suspected additional subtle foci of chronic myelomalacia inferiorly at C6-7, slightly worse on the left. No acute cord signal changes or other abnormality. 2. Underlying multilevel cervical spondylosis with resultant mild spinal stenosis at C4-5 through C7-T1. No frank cord impingement. 3. Multifactorial degenerative changes with resultant multilevel foraminal narrowing as above. Notable findings include moderate right C4 foraminal stenosis, severe right C5 foraminal narrowing, severe right worse than left C6 foraminal stenosis, with severe right and moderate left C7 foraminal narrowing.  Lowry Ram, MD 08/19/2022, 1:04 PM  PGY-1, [place] Intern pager: [phone], text pages welcome Secure chat group [place]

## 2022-08-19 NOTE — Discharge Instructions (Addendum)
Dear Patrick Romero,   Thank you so much for allowing [place] to be part of your care!  You were admitted to [facility] for an episode of passing out. We did several tests including blood work and imaging studies of your brain and heart. These tests came back mostly normal. We did see that you were dehydrated. We believe the cause of your passing out episode was low blood pressure from dehydration. Your alcohol and substance use contributes to your dehydration.     POST-HOSPITAL & CARE INSTRUCTIONS Please stay hydrated with water or pedialyte. Try to avoid alcohol, cocaine and marijuana as these can make you more likely to have these episodes.   Please see medications section of this packet for any medication changes- we stopped your amlodipine (blood pressure medicine)  DOCTOR'S APPOINTMENT & FOLLOW UP CARE INSTRUCTIONS  Future Appointments  Date Time Provider [place]  09/19/2022  4:10 PM Ladell Pier, MD [id number] None    RETURN PRECAUTIONS: Please come back to the ED if you have worsening shortness of breath, palpitations, have sudden weakness, or feel like you will pass out.   Take care and be well!  [facility]  [place], [place] [address] [phone]

## 2022-08-19 NOTE — Progress Notes (Addendum)
Discussed results of ECHO with patient. Patient would like to go home in the morning as he does not have a ride home tonight.  Will follow up iron studies and monitor patient's temperature and vitals and discharge patient tomorrow

## 2022-08-19 NOTE — Procedures (Signed)
Patient Name: Patrick Romero  MRN: [id number]  Epilepsy Attending: Lora Havens  Referring Physician/Provider: Sharion Settler, DO  Date: 08/19/2022 Duration: 24.30 mins  Patient history: 62yo M with syncope. EEG to evaluate for seizure  Level of alertness: Awake, asleep  AEDs during EEG study: GBP  Technical aspects: This EEG study was done with scalp electrodes positioned according to the 10-20 International system of electrode placement. Electrical activity was reviewed with band pass filter of 1-70Hz$ , sensitivity of 7 uV/mm, display speed of 46m/sec with a 60Hz$  notched filter applied as appropriate. EEG data were recorded continuously and digitally stored.  Video monitoring was available and reviewed as appropriate.  Description: The posterior dominant rhythm consists of 9 Hz activity of moderate voltage (25-35 uV) seen predominantly in posterior head regions, symmetric and reactive to eye opening and eye closing. Sleep was characterized by vertex waves, sleep spindles (12 to 14 Hz), maximal frontocentral region.  Hyperventilation and photic stimulation were not performed.     IMPRESSION: This study is within normal limits. No seizures or epileptiform discharges were seen throughout the recording.  A normal interictal EEG does not exclude the diagnosis of epilepsy.   [place]erster OBarbra Sarks

## 2022-08-19 NOTE — ED Notes (Signed)
MD at bedside. 

## 2022-08-19 NOTE — ED Notes (Signed)
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 during bolus administration of intravenous contrast. Multiplanar CT image reconstructions and MIPs were obtained to evaluate the vascular anatomy. Carotid stenosis measurements (when applicable) are obtained utilizing NASCET criteria, using the distal internal carotid diameter as the denominator. RADIATION DOSE REDUCTION: This exam was performed according to the departmental dose-optimization program which includes automated exposure control, adjustment of the mA and/or kV according to patient size  and/or use of iterative reconstruction technique. CONTRAST:  48m OMNIPAQUE IOHEXOL 350 MG/ML SOLN COMPARISON:  Comparison made with prior CT from 07/08/2021. FINDINGS: CT HEAD FINDINGS Brain: Cerebral volume within normal limits. No acute intracranial hemorrhage. No acute large vessel territory infarct. 5 mm hyperdense lesion positioned along the right tentorium, likely a small meningioma. No associated edema or mass effect. No other mass lesion. No midline shift. No hydrocephalus or extra-axial fluid collection. Vascular: No abnormal hyperdense vessel. Skull: Scalp soft tissues and  calvarium within normal limits. Sinuses/Orbits: Globes orbital soft tissues demonstrate no acute finding. Paranasal sinuses and mastoid air cells are largely clear. Other: None. ASPECTS ([place] Stroke Program Early CT Score) - Ganglionic level infarction (caudate, lentiform nuclei, internal capsule, insula, M1-M3 cortex): 7 - Supraganglionic infarction (M4-M6 cortex): 3 Total score (0-10 with 10 being normal): 10 CTA NECK FINDINGS Aortic arch: Visualized aortic arch normal caliber standard branch pattern. No visible stenosis about the origin of the great vessels. Right carotid system: Right common and internal carotid arteries are patent without dissection. Mild atheromatous change about the right carotid bulb/proximal right ICA without hemodynamically significant greater than 50% stenosis. Left carotid system: Left common and internal carotid arteries are patent without dissection. Mild atheromatous change about the left carotid bulb/proximal left ICA with associated stenosis of up to 50% by NASCET criteria (series 7, image 216). Vertebral arteries: Both vertebral arteries arise from subclavian arteries. No proximal subclavian artery stenosis. Vertebral arteries patent without stenosis or dissection. Skeleton: No discrete or worrisome osseous lesions. Mild-to-moderate multilevel cervical spondylosis without high-grade spinal stenosis.  Other neck: Cyst no other acute soft tissue abnormality within the neck. Small retention cyst noted at the central nasopharynx. Upper chest: Mild emphysema. Visualized upper chest demonstrates no other acute finding. Review of the MIP images confirms the above findings CTA HEAD FINDINGS Anterior circulation: Mild atheromatous change within the right carotid siphon without hemodynamically significant stenosis. A1 segments patent bilaterally. Normal anterior communicating artery complex. Anterior cerebral arteries patent without stenosis. No M1 stenosis or occlusion. No proximal MCA branch occlusion or high-grade stenosis. Distal MCA branches perfused and symmetric. Posterior circulation: Both V4 segments patent without stenosis. Left vertebral artery dominant. Both PICA patent. Basilar widely patent without stenosis. Superior cerebellar and posterior cerebral arteries patent bilaterally. Venous sinuses: Patent allowing for timing the contrast bolus. Anatomic variants: None significant.  No aneurysm. Review of the MIP images confirms the above findings IMPRESSION: CT HEAD: 1. No acute intracranial abnormality. 2. Aspects = 10. 3. 5 mm hyperdense lesion along the right tentorium, likely a small meningioma. No associated edema or mass effect. CTA HEAD AND NECK: 1. Negative CTA for large vessel occlusion or other emergent finding. 2. 50% atheromatous stenosis about the left carotid bulb/proximal left ICA. 3. Additional mild atheromatous change about the carotid bifurcations and carotid siphons without hemodynamically significant or correctable stenosis. These results were communicated to Dr. Curly Shores at 7:44 pm on 08/18/2022 by text page via the [facility] messaging system. Electronically Signed   By: Jeannine Boga M.D.   On: 08/18/2022 19:52   CT ANGIO HEAD NECK W WO CM (CODE STROKE)  Result Date: 08/18/2022 CLINICAL DATA:  Initial evaluation for acute neuro deficit, stroke. EXAM: CT HEAD WITHOUT CONTRAST CT  ANGIOGRAPHY HEAD AND NECK TECHNIQUE: Multidetector CT imaging of the head and neck was performed using the standard protocol during bolus administration of intravenous contrast. Multiplanar CT image reconstructions and MIPs were obtained to evaluate the vascular anatomy. Carotid stenosis measurements (when applicable) are obtained utilizing NASCET criteria, using the distal internal carotid diameter as the denominator. RADIATION DOSE REDUCTION: This exam was performed according to the departmental dose-optimization program which includes automated exposure control, adjustment of the mA and/or kV according to patient size and/or use of iterative reconstruction technique. CONTRAST:  40m OMNIPAQUE IOHEXOL 350 MG/ML SOLN COMPARISON:  Comparison made with prior CT from 07/08/2021. FINDINGS: CT HEAD FINDINGS Brain: Cerebral volume within normal limits. No acute intracranial hemorrhage. No acute large vessel territory infarct. 5  mm hyperdense lesion positioned along the right tentorium, likely a small meningioma. No associated edema or mass effect. No other mass lesion. No midline shift. No hydrocephalus or extra-axial fluid collection. Vascular: No abnormal hyperdense vessel. Skull: Scalp soft tissues and calvarium within normal limits. Sinuses/Orbits: Globes orbital soft tissues demonstrate no acute finding. Paranasal sinuses and mastoid air cells are largely clear. Other: None. ASPECTS ([place] Stroke Program Early CT Score) - Ganglionic level infarction (caudate, lentiform nuclei, internal capsule, insula, M1-M3 cortex): 7 - Supraganglionic infarction (M4-M6 cortex): 3 Total score (0-10 with 10 being normal): 10 CTA NECK FINDINGS Aortic arch: Visualized aortic arch normal caliber standard branch pattern. No visible stenosis about the origin of the great vessels. Right carotid system: Right common and internal carotid arteries are patent without dissection. Mild atheromatous change about the right carotid bulb/proximal  right ICA without hemodynamically significant greater than 50% stenosis. Left carotid system: Left common and internal carotid arteries are patent without dissection. Mild atheromatous change about the left carotid bulb/proximal left ICA with associated stenosis of up to 50% by NASCET criteria (series 7, image 216). Vertebral arteries: Both vertebral arteries arise from subclavian arteries. No proximal subclavian artery stenosis. Vertebral arteries patent without stenosis or dissection. Skeleton: No discrete or worrisome osseous lesions. Mild-to-moderate multilevel cervical spondylosis without high-grade spinal stenosis. Other neck: Cyst no other acute soft tissue abnormality within the neck. Small retention cyst noted at the central nasopharynx. Upper chest: Mild emphysema. Visualized upper chest demonstrates no other acute finding. Review of the MIP images confirms the above findings CTA HEAD FINDINGS Anterior circulation: Mild atheromatous change within the right carotid siphon without hemodynamically significant stenosis. A1 segments patent bilaterally. Normal anterior communicating artery complex. Anterior cerebral arteries patent without stenosis. No M1 stenosis or occlusion. No proximal MCA branch occlusion or high-grade stenosis. Distal MCA branches perfused and symmetric. Posterior circulation: Both V4 segments patent without stenosis. Left vertebral artery dominant. Both PICA patent. Basilar widely patent without stenosis. Superior cerebellar and posterior cerebral arteries patent bilaterally. Venous sinuses: Patent allowing for timing the contrast bolus. Anatomic variants: None significant.  No aneurysm. Review of the MIP images confirms the above findings IMPRESSION: CT HEAD: 1. No acute intracranial abnormality. 2. Aspects = 10. 3. 5 mm hyperdense lesion along the right tentorium, likely a small meningioma. No associated edema or mass effect. CTA HEAD AND NECK: 1. Negative CTA for large vessel occlusion  or other emergent finding. 2. 50% atheromatous stenosis about the left carotid bulb/proximal left ICA. 3. Additional mild atheromatous change about the carotid bifurcations and carotid siphons without hemodynamically significant or correctable stenosis. These results were communicated to Dr. Curly Shores at 7:44 pm on 08/18/2022 by text page via the [facility] messaging system. Electronically Signed   By: Jeannine Boga M.D.   On: 08/18/2022 19:52    Pending Labs Unresulted Labs (From admission, onward)     Start     Ordered   08/25/22 0500  Creatinine, serum  (enoxaparin (LOVENOX)    CrCl >/= 30 ml/min)  Weekly,   R     Comments: while on enoxaparin therapy    08/18/22 2342   08/20/22 0500  Ferritin  Tomorrow morning,   R        08/19/22 1129   08/20/22 0500  Iron and TIBC  Tomorrow morning,   R        08/19/22 1129   08/18/22 2340  HIV Antibody (routine testing w rflx)  (HIV Antibody (Routine testing w reflex) panel)  Once,   R        08/18/22 2342            Vitals/Pain Today's Vitals   08/19/22 1100 08/19/22 1115 08/19/22 1200 08/19/22 1212  BP: (!) 144/91 (!) 138/91 (!) 152/99   Pulse: 71 69 66   Resp: 19 17 16   $ Temp:      TempSrc:      SpO2: 100% 100% 100%   Weight:      Height:      PainSc:    0-No pain    Isolation Precautions No active isolations  Medications Medications  enoxaparin (LOVENOX) injection 40 mg (40 mg Subcutaneous Given 08/19/22 0913)  thiamine (VITAMIN B1) tablet 100 mg (100 mg Oral Given 08/19/22 1032)  aspirin EC tablet 81 mg (81 mg Oral Given 08/19/22 0912)  atorvastatin (LIPITOR) tablet 10 mg (10 mg Oral Given [id number])  folic acid (FOLVITE) tablet 1 mg (1 mg Oral Given 08/19/22 0947)  multivitamin with minerals tablet 1 tablet (1 tablet Oral Given 08/19/22 0911)  gabapentin (NEURONTIN) capsule 900 mg (has no administration in time range)  baclofen (LIORESAL) tablet 10 mg (10 mg Oral Given 08/19/22 1209)  sodium chloride flush (NS) 0.9 % injection 3 mL  (3 mLs Intravenous Given 08/18/22 2036)  iohexol (OMNIPAQUE) 350 MG/ML injection 75 mL (75 mLs Intravenous Contrast Given 08/18/22 1931)  lactated ringers bolus 1,000 mL (0 mLs Intravenous Stopped 08/18/22 2224)  lactated ringers bolus 1,000 mL (0 mLs Intravenous Stopped 08/18/22 2250)    And  lactated ringers bolus 250 mL (0 mLs Intravenous Stopped 08/18/22 2224)  potassium chloride SA (KLOR-CON M) CR tablet 40 mEq (40 mEq Oral Given 08/19/22 0043)    Mobility walks with person assist     Focused Assessments Cardiac Assessment Handoff:  Cardiac Rhythm: Normal sinus rhythm Lab Results  Component Value Date   CKTOTAL 139 08/18/2022   TROPONINI <0.03 02/22/2017   Lab Results  Component Value Date   DDIMER 0.44 11/23/2020   Does the Patient currently have chest pain? No   , Neuro Assessment Handoff:  Swallow screen pass? Yes  Cardiac Rhythm: Normal sinus rhythm NIH Stroke Scale  Dizziness Present: No Headache Present: No Interval: Shift assessment Level of Consciousness (1a.)   : Alert, keenly responsive LOC Questions (1b. )   : Answers both questions correctly LOC Commands (1c. )   : Performs both tasks correctly Best Gaze (2. )  : Normal Visual (3. )  : No visual loss Facial Palsy (4. )    : Normal symmetrical movements Motor Arm, Left (5a. )   : No drift Motor Arm, Right (5b. ) : No drift Motor Leg, Left (6a. )  : No drift Motor Leg, Right (6b. ) : No drift Limb Ataxia (7. ): Absent Sensory (8. )  : Normal, no sensory loss Best Language (9. )  : No aphasia Dysarthria (10. ): Normal Extinction/Inattention (11.)   : No Abnormality Complete NIHSS TOTAL: 0 Last date known well: 08/18/22 Last time known well: 1720 Neuro Assessment: Exceptions to [facility] Neuro Checks:   Initial (08/18/22 1920)  Has TPA been given? No If patient is a Neuro Trauma and patient is going to OR before floor call report to [place] nurse: [phone] or [phone]  , Pulmonary Assessment  Handoff:  Lung sounds:   O2 Device: Room Air      R Recommendations: See Admitting Provider Note  Report given to:   Additional Notes:

## 2022-08-20 DIAGNOSIS — R55 Syncope and collapse: Secondary | ICD-10-CM | POA: Diagnosis not present

## 2022-08-20 LAB — IRON AND TIBC
Iron: 130 ug/dL (ref 45–182)
Saturation Ratios: 49 % — ABNORMAL HIGH (ref 17.9–39.5)
TIBC: 267 ug/dL (ref 250–450)
UIBC: 137 ug/dL

## 2022-08-20 LAB — BASIC METABOLIC PANEL
Anion gap: 7 (ref 5–15)
BUN: 8 mg/dL (ref 8–23)
CO2: 25 mmol/L (ref 22–32)
Calcium: 8.5 mg/dL — ABNORMAL LOW (ref 8.9–10.3)
Chloride: 106 mmol/L (ref 98–111)
Creatinine, Ser: 0.86 mg/dL (ref 0.61–1.24)
GFR, Estimated: >60 mL/min (ref >60)
Glucose, Bld: 104 mg/dL — ABNORMAL HIGH (ref 70–99)
Potassium: 3.4 mmol/L — ABNORMAL LOW (ref 3.5–5.1)
Sodium: 138 mmol/L (ref 135–145)

## 2022-08-20 LAB — HIV ANTIBODY (ROUTINE TESTING W REFLEX): HIV Screen 4th Generation wRfx: NONREACTIVE

## 2022-08-20 LAB — FERRITIN: Ferritin: 54 ng/mL (ref 24–336)

## 2022-08-20 MED ORDER — VITAMIN B-1 100 MG PO TABS: 100.0000 mg | ORAL_TABLET | Freq: Every day | ORAL | 0 refills | Status: AC

## 2022-08-20 MED ORDER — POTASSIUM CHLORIDE CRYS ER 20 MEQ PO TBCR
40.0000 meq | EXTENDED_RELEASE_TABLET | Freq: Once | ORAL | Status: AC
  Administered 2022-08-20: 40 meq via ORAL
  Filled 2022-08-20: qty 2

## 2022-08-20 MED ORDER — FOLIC ACID 1 MG PO TABS: 1.0000 mg | ORAL_TABLET | Freq: Every day | ORAL | 0 refills | Status: DC

## 2022-08-20 MED ORDER — ADULT MULTIVITAMIN W/MINERALS CH: 1.0000 | ORAL_TABLET | Freq: Every day | ORAL | 0 refills | Status: AC

## 2022-08-20 NOTE — Plan of Care (Signed)

## 2022-08-20 NOTE — Progress Notes (Signed)
Pt verbalized d/c instructions in AVS. All questions answered. Iv removed, pt discharged with belongings and states he will pick up his prescriptions. Patient refused to be wheeled out and walked out with his family.

## 2022-08-20 NOTE — Discharge Summary (Signed)
[facility] Discharge Summary  Patient name: Patrick Romero Medical record number: [id number] Date of birth: [birth year] Age: 62 y.o. Gender: male Date of Admission: 08/18/2022  Date of Discharge: 08/20/2022 Admitting Physician: Camelia Phenes, MD  Primary Care Provider: Ladell Pier, MD Consultants: None  Indication for Hospitalization: Syncope  Discharge Diagnoses/Problem List:  Principal Problem for Admission:  Principal Problem:   Syncope Active Problems:   Hypotension   Alcohol intoxication ([place])   Anemia   Syncope and collapse   Brief Hospital Course:  Patrick Romero is a 62 y.o.male with a history of HTN, Central Cord syndrome, polysubstance use disorder who was admitted to the [facility] Medicine Teaching Service at [facility] for syncope. His hospital course is detailed below:  Syncope  Patient had witnessed syncopal episode while sitting on barstool. Found to be hypotensive on arrival. Thought to be related to hypotension secondary to dehydration, particularly in the setting of his alcohol and substance use as well as recent viral illness. May also be a component of autonomic instability from his spinal cord injury. This is a recurring problem for patient dating back to 2018. Had basic labs, CT and CTA head, echo, and EEG this admission which were unremarkable. His amlodipine was discontinued. Improved hydration and abstinence from alcohol/recreational drugs was recommended.  Alcohol Use Disorder Patient with chronic alcohol use. EtOh level 114 on arrival. Did not show evidence of withdrawal during his brief hospitalization. Encouraged cessation.  Hypokalemia Received PO repletion.  Normocytic Anemia  Patient has history of anemia and had hgb of 9.4 on admission. Iron studies consistent with anemia of chronic disease. No evidence of iron deficiency.  PCP Follow-up Recommendations: Encourage ongoing cessation from alcohol/substances Amlodipine  was discontinued. Would be very cautious about resuming anti-hypertensive given his recurrent syncope.   Disposition: Home  Discharge Condition: Stable, improved  Discharge Exam:  Vitals:   08/20/22 0002 08/20/22 0449  BP: 117/70 (!) 135/98  Pulse: 77 69  Resp: 16 16  Temp: 98 F (36.7 C) 98.1 F (36.7 C)  SpO2: 98% 100%   Gen: alert, NAD HEENT: moist mucous membranes CV: RRR, normal S1/S2 Resp: normal effort, lungs CTAB GI: abdomen soft, NTND Ext: no peripheral edema Neuro: A&O x4, no focal deficits appreciated, normal speech, sensation intact to light touch throughout, 5-/5 strength in right upper extremity  Significant Procedures: None  Significant Labs and Imaging:  Recent Labs  Lab 08/18/22 1919 08/18/22 1926 08/19/22 0121  WBC 6.4  --  5.3  HGB 9.4* 10.9* 9.1*  HCT 29.6* 32.0* 29.5*  PLT 275  --  244   Recent Labs  Lab 08/18/22 1919 08/18/22 1926 08/19/22 0121 08/20/22 0250  NA 136 140 136 138  K 3.1* 3.2* 3.9 3.4*  CL 108 108 105 106  CO2 18*  --  20* 25  GLUCOSE 106* 105* 84 104*  BUN 10 9 7* 8  CREATININE 1.00 1.10 0.79 0.86  CALCIUM 8.1*  --  8.2* 8.5*  MG 1.8  --   --   --   ALKPHOS 43  --   --   --   AST 14*  --   --   --   ALT 10  --   --   --   ALBUMIN 3.1*  --   --   --     ECHOCARDIOGRAM COMPLETE  Result Date: 08/19/2022    ECHOCARDIOGRAM REPORT   Patient Name:   Patrick Romero Date of  Exam: 08/19/2022 Medical Rec #:  [id number]      Height:       72.0 in Accession #:    [id number]     Weight:       155.0 lb Date of Birth:  [birth year]     BSA:          1.912 m Patient Age:    65 years       BP:           135/86 mmHg Patient Gender: M              HR:           71 bpm. Exam Location:  Inpatient Procedure: 2D Echo, Cardiac Doppler and Color Doppler Indications:     Syncope  History:         Patient has prior history of Echocardiogram examinations, most                  recent 11/23/2020. COPD; Risk Factors:Hypertension. ETOH abuse.                   COPD.  Sonographer:     Clayton Lefort RDCS (AE) Referring Phys:  Francis Dowse Jamal Collin HENSEL Diagnosing Phys: Oswaldo Milian MD IMPRESSIONS  1. Left ventricular ejection fraction, by estimation, is 65 to 70%. The left ventricle has normal function. The left ventricle has no regional wall motion abnormalities. There is mild left ventricular hypertrophy. Left ventricular diastolic parameters were normal.  2. Right ventricular systolic function is normal. The right ventricular size is normal. Tricuspid regurgitation signal is inadequate for assessing PA pressure.  3. The mitral valve is normal in structure. Trivial mitral valve regurgitation. No evidence of mitral stenosis.  4. The aortic valve is tricuspid. Aortic valve regurgitation is not visualized. No aortic stenosis is present.  5. The inferior vena cava is normal in size with greater than 50% respiratory variability, suggesting right atrial pressure of 3 mmHg. FINDINGS  Left Ventricle: Left ventricular ejection fraction, by estimation, is 65 to 70%. The left ventricle has normal function. The left ventricle has no regional wall motion abnormalities. The left ventricular internal cavity size was normal in size. There is  mild left ventricular hypertrophy. Left ventricular diastolic parameters were normal. Right Ventricle: The right ventricular size is normal. No increase in right ventricular wall thickness. Right ventricular systolic function is normal. Tricuspid regurgitation signal is inadequate for assessing PA pressure. Left Atrium: Left atrial size was normal in size. Right Atrium: Right atrial size was normal in size. Pericardium: Trivial pericardial effusion is present. Mitral Valve: The mitral valve is normal in structure. Trivial mitral valve regurgitation. No evidence of mitral valve stenosis. Tricuspid Valve: The tricuspid valve is normal in structure. Tricuspid valve regurgitation is trivial. Aortic Valve: The aortic valve is tricuspid. Aortic valve  regurgitation is not visualized. No aortic stenosis is present. Aortic valve mean gradient measures 3.0 mmHg. Aortic valve peak gradient measures 6.2 mmHg. Aortic valve area, by VTI measures 2.84 cm. Pulmonic Valve: The pulmonic valve was not well visualized. Pulmonic valve regurgitation is not visualized. Aorta: The aortic root and ascending aorta are structurally normal, with no evidence of dilitation. Venous: The inferior vena cava is normal in size with greater than 50% respiratory variability, suggesting right atrial pressure of 3 mmHg. IAS/Shunts: The interatrial septum was not well visualized.  LEFT VENTRICLE PLAX 2D LVIDd:         4.20 cm   Diastology LVIDs:  2.70 cm   LV e' medial:    10.10 cm/s LV PW:         1.10 cm   LV E/e' medial:  8.2 LV IVS:        1.00 cm   LV e' lateral:   10.70 cm/s LVOT diam:     2.20 cm   LV E/e' lateral: 7.8 LV SV:         68 LV SV Index:   36 LVOT Area:     3.80 cm  RIGHT VENTRICLE RV Basal diam:  3.10 cm RV S prime:     12.50 cm/s TAPSE (M-mode): 2.1 cm LEFT ATRIUM             Index        RIGHT ATRIUM           Index LA diam:        2.90 cm 1.52 cm/m   RA Area:     13.60 cm LA Vol (A2C):   24.3 ml 12.71 ml/m  RA Volume:   32.80 ml  17.16 ml/m LA Vol (A4C):   24.1 ml 12.61 ml/m LA Biplane Vol: 26.2 ml 13.70 ml/m  AORTIC VALVE AV Area (Vmax):    2.67 cm AV Area (Vmean):   2.68 cm AV Area (VTI):     2.84 cm AV Vmax:           124.00 cm/s AV Vmean:          81.000 cm/s AV VTI:            0.241 m AV Peak Grad:      6.2 mmHg AV Mean Grad:      3.0 mmHg LVOT Vmax:         87.00 cm/s LVOT Vmean:        57.100 cm/s LVOT VTI:          0.180 m LVOT/AV VTI ratio: 0.75  AORTA Ao Root diam: 3.20 cm Ao Asc diam:  3.30 cm MITRAL VALVE MV Area (PHT): 2.66 cm    SHUNTS MV Decel Time: 285 msec    Systemic VTI:  0.18 m MV E velocity: 83.30 cm/s  Systemic Diam: 2.20 cm MV A velocity: 62.90 cm/s MV E/A ratio:  1.32 Oswaldo Milian MD Electronically signed by Oswaldo Milian MD Signature Date/Time: 08/19/2022/6:08:37 PM    Final (Updated)    EEG adult  Result Date: 08/19/2022 Lora Havens, MD     08/19/2022 12:44 PM Patient Name: SHREE KERNER MRN: [id number] Epilepsy Attending: Lora Havens Referring Physician/Provider: Sharion Settler, DO Date: 08/19/2022 Duration: 24.30 mins Patient history: 62yo M with syncope. EEG to evaluate for seizure Level of alertness: Awake, asleep AEDs during EEG study: GBP Technical aspects: This EEG study was done with scalp electrodes positioned according to the 10-20 International system of electrode placement. Electrical activity was reviewed with band pass filter of 1-70Hz$ , sensitivity of 7 uV/mm, display speed of 38m/sec with a 60Hz$  notched filter applied as appropriate. EEG data were recorded continuously and digitally stored.  Video monitoring was available and reviewed as appropriate. Description: The posterior dominant rhythm consists of 9 Hz activity of moderate voltage (25-35 uV) seen predominantly in posterior head regions, symmetric and reactive to eye opening and eye closing. Sleep was characterized by vertex waves, sleep spindles (12 to 14 Hz), maximal frontocentral region.  Hyperventilation and photic stimulation were not performed.   IMPRESSION: This study is within normal limits. No seizures  or epileptiform discharges were seen throughout the recording. A normal interictal EEG does not exclude the diagnosis of epilepsy. Priyanka Barbra Sarks     Results/Tests Pending at Time of Discharge: None  Discharge Medications:  Allergies as of 08/20/2022   No Known Allergies      Medication List     STOP taking these medications    amitriptyline 25 MG tablet Commonly known as: ELAVIL   amLODipine 5 MG tablet Commonly known as: NORVASC   oxyCODONE-acetaminophen 10-325 MG tablet Commonly known as: PERCOCET       TAKE these medications    albuterol 108 (90 Base) MCG/ACT inhaler Commonly known as: VENTOLIN  HFA Inhale 2 puffs into the lungs every 6 (six) hours as needed for wheezing or shortness of breath. What changed: when to take this   aspirin EC 81 MG tablet Take 1 tablet (81 mg total) by mouth daily.   atorvastatin 10 MG tablet Commonly known as: LIPITOR Take 1 tablet (10 mg total) by mouth daily.   baclofen 10 MG tablet Commonly known as: LIORESAL Take 10 mg by mouth 3 (three) times daily.   buPROPion 150 MG 12 hr tablet Commonly known as: ZYBAN Take 150 mg by mouth 2 (two) times daily.   folic acid 1 MG tablet Commonly known as: FOLVITE Take 1 tablet (1 mg total) by mouth daily.   gabapentin 300 MG capsule Commonly known as: NEURONTIN TAKE 3 CAPSULES BY MOUTH 3 TIMES A DAY What changed:  how much to take how to take this when to take this additional instructions   multivitamin with minerals Tabs tablet Take 1 tablet by mouth daily.   Narcan 4 MG/0.1ML Liqd nasal spray kit Generic drug: naloxone Place 1 spray into the nose as needed (overdose). As needed   thiamine 100 MG tablet Commonly known as: Vitamin B-1 Take 1 tablet (100 mg total) by mouth daily.        Discharge Instructions: Please refer to Patient Instructions section of EMR for full details.  Patient was counseled important signs and symptoms that should prompt return to medical care, changes in medications, dietary instructions, activity restrictions, and follow up appointments.   Follow-Up Appointments:  Follow-up Information     Ladell Pier, MD Follow up.   Specialty: Internal Medicine Contact information: [address] [place] [address] [phone]                 Alcus Dad, MD 08/20/2022, 7:24 AM PGY-3, [place]

## 2022-08-22 ENCOUNTER — Telehealth: Payer: Self-pay

## 2022-08-22 NOTE — Telephone Encounter (Signed)
Transition Care Management Follow-up Telephone Call Date of discharge and from where: 08/20/2022, [facility] How have you been since you were released from the hospital? He said he is feeling better, just tired now.  Any questions or concerns? Yes- he stated he needs baclofen for his spasms.    Items Reviewed: Did the pt receive and understand the discharge instructions provided? Yes  Medications obtained and verified?  We reviewed the medication list.  He has all of his medications except  the thiamine which he will pick up tomorrow.  He also needs to get the aspirin.  He does not have baclofen and is requesting a new prescription  I told him that Dr Wynetta Emery has not seen him since 04/2021 and inquired who had been prescribing it since then.  He said he was seen by Dr Royce Macadamia at [facility] pain management and he was prescribing it  However, he said that Dr Royce Macadamia wanted to do a test, he thinks it was an HIV test , and the patient refused so Dr Royce Macadamia dropped him  He has contact information for [facility] clinic in [facility] and said he may call them.  I explained that Dr Wynetta Emery does not have any open appointments before 09/19/2022 and offered to schedule him at another clinic to be seen sooner and he said he wants to stay with Dr Wynetta Emery  Other? No  Any new allergies since your discharge? No  Dietary orders reviewed? No Do you have support at home? Yes - family  Home Care and Equipment/Supplies: Were home health services ordered? no If so, what is the name of the agency? N/a  Has the agency set up a time to come to the patient's home? not applicable Were any new equipment or medical supplies ordered?  No What is the name of the medical supply agency? N/a Were you able to get the supplies/equipment? not applicable Do you have any questions related to the use of the equipment or supplies? No  Functional Questionnaire: (I = Independent and D = Dependent) ADLs: ambulates with cane. He has an  aide that comes 2 hours/ day x 6 days/week to assist with personal care    Follow up appointments reviewed:  PCP Hospital f/u appt confirmed? Yes  Scheduled to see Dr Wynetta Emery - 09/19/2022.  [facility] f/u appt confirmed?  None scheduled at this time Are transportation arrangements needed? No  If their condition worsens, is the pt aware to call PCP or go to the Emergency Dept.? Yes Was the patient provided with contact information for the PCP's office or ED? Yes Was to pt encouraged to call back with questions or concerns? Yes

## 2022-08-23 LAB — CULTURE, BLOOD (ROUTINE X 2)
Culture: NO GROWTH
Culture: NO GROWTH
Special Requests: ADEQUATE

## 2022-09-11 ENCOUNTER — Other Ambulatory Visit: Payer: Self-pay | Admitting: Family Medicine

## 2022-09-19 ENCOUNTER — Ambulatory Visit: Payer: Self-pay | Admitting: Internal Medicine

## 2022-10-19 ENCOUNTER — Other Ambulatory Visit: Payer: Self-pay | Admitting: Family Medicine

## 2022-10-19 DIAGNOSIS — F17219 Nicotine dependence, cigarettes, with unspecified nicotine-induced disorders: Secondary | ICD-10-CM

## 2022-11-02 ENCOUNTER — Ambulatory Visit: Payer: Medicare HMO | Admitting: Podiatry

## 2022-11-11 ENCOUNTER — Ambulatory Visit: Payer: Medicare HMO | Admitting: Podiatry

## 2022-11-16 ENCOUNTER — Inpatient Hospital Stay: Admission: RE | Admit: 2022-11-16 | Payer: Medicare HMO | Source: Ambulatory Visit

## 2022-11-23 ENCOUNTER — Ambulatory Visit (INDEPENDENT_AMBULATORY_CARE_PROVIDER_SITE_OTHER): Payer: Medicare HMO | Admitting: Podiatry

## 2022-11-23 DIAGNOSIS — M79674 Pain in right toe(s): Secondary | ICD-10-CM

## 2022-11-23 DIAGNOSIS — B351 Tinea unguium: Secondary | ICD-10-CM | POA: Diagnosis not present

## 2022-11-23 DIAGNOSIS — M79675 Pain in left toe(s): Secondary | ICD-10-CM
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
## 2022-12-09 ENCOUNTER — Ambulatory Visit: Payer: Medicare HMO | Attending: Cardiovascular Disease | Admitting: Cardiovascular Disease

## 2022-12-09 ENCOUNTER — Encounter: Payer: Self-pay | Admitting: Cardiovascular Disease

## 2022-12-16 ENCOUNTER — Inpatient Hospital Stay: Admission: RE | Admit: 2022-12-16 | Payer: Medicare HMO | Source: Ambulatory Visit

## 2023-01-09 ENCOUNTER — Ambulatory Visit: Payer: Medicare HMO | Attending: Cardiovascular Disease | Admitting: Cardiovascular Disease

## 2023-01-09 VITALS — BP 90/58 | HR 98 | Ht 72.0 in | Wt 139.0 lb

## 2023-01-09 DIAGNOSIS — I700 Atherosclerosis of aorta: Secondary | ICD-10-CM | POA: Diagnosis not present

## 2023-01-09 NOTE — Patient Instructions (Signed)
Medication Instructions:  Your physician recommends that you continue on your current medications as directed. Please refer to the Current Medication list given to you today. *If you need a refill on your cardiac medications before your next appointment, please call your pharmacy*    Follow-Up: At [facility], you and your health needs are our priority.  As part of our continuing mission to provide you with exceptional heart care, we have created designated Provider Care Teams.  These Care Teams include your primary Cardiologist (physician) and Advanced Practice Providers (APPs -  Physician Assistants and Nurse Practitioners) who all work together to provide you with the care you need, when you need it.  We recommend signing up for the patient portal called "MyChart".  Sign up information is provided on this After Visit Summary.  MyChart is used to connect with patients for Virtual Visits (Telemedicine).  Patients are able to view lab/test results, encounter notes, upcoming appointments, etc.  Non-urgent messages can be sent to your provider as well.   To learn more about what you can do with MyChart, go to [email or web address].    Your next appointment:   2 month(s)  Provider:   York Pellant, MD
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
## 2023-01-17 ENCOUNTER — Inpatient Hospital Stay: Admission: RE | Admit: 2023-01-17 | Payer: Medicare HMO | Source: Ambulatory Visit

## 2023-02-08 ENCOUNTER — Ambulatory Visit
Admission: RE | Admit: 2023-02-08 | Discharge: 2023-02-08 | Disposition: A | Discharge status: Home / Self Care | Payer: Medicare HMO | Source: Ambulatory Visit | Attending: Family Medicine | Admitting: Family Medicine

## 2023-02-08 DIAGNOSIS — F17219 Nicotine dependence, cigarettes, with unspecified nicotine-induced disorders: Secondary | ICD-10-CM

## 2023-02-27 ENCOUNTER — Ambulatory Visit: Payer: Medicare HMO | Admitting: Podiatry

## 2023-03-02 ENCOUNTER — Other Ambulatory Visit: Payer: Self-pay | Admitting: Internal Medicine

## 2023-03-02 DIAGNOSIS — I10 Essential (primary) hypertension: Secondary | ICD-10-CM

## 2023-03-20 ENCOUNTER — Ambulatory Visit: Payer: Medicare HMO | Admitting: Cardiovascular Disease

## 2023-04-06 ENCOUNTER — Ambulatory Visit: Payer: Medicare HMO | Admitting: Cardiovascular Disease

## 2023-05-04 ENCOUNTER — Ambulatory Visit: Payer: Medicare HMO | Attending: Cardiovascular Disease | Admitting: Cardiovascular Disease

## 2023-05-04 ENCOUNTER — Encounter: Payer: Self-pay | Admitting: Cardiovascular Disease

## 2023-05-04 VITALS — BP 100/70 | HR 87 | Ht 72.0 in | Wt 141.2 lb

## 2023-05-04 DIAGNOSIS — R001 Bradycardia, unspecified: Secondary | ICD-10-CM | POA: Diagnosis not present

## 2023-05-04 DIAGNOSIS — R55 Syncope and collapse: Secondary | ICD-10-CM | POA: Diagnosis not present

## 2023-05-04 NOTE — Patient Instructions (Signed)
Medication Instructions:  Your physician recommends that you continue on your current medications as directed. Please refer to the Current Medication list given to you today. *If you need a refill on your cardiac medications before your next appointment, please call your pharmacy*   Follow-Up: At [facility], you and your health needs are our priority.  As part of our continuing mission to provide you with exceptional heart care, we have created designated Provider Care Teams.  These Care Teams include your primary Cardiologist (physician) and Advanced Practice Providers (APPs -  Physician Assistants and Nurse Practitioners) who all work together to provide you with the care you need, when you need it.  We recommend signing up for the patient portal called "MyChart".  Sign up information is provided on this After Visit Summary.  MyChart is used to connect with patients for Virtual Visits (Telemedicine).  Patients are able to view lab/test results, encounter notes, upcoming appointments, etc.  Non-urgent messages can be sent to your provider as well.   To learn more about what you can do with MyChart, go to [email or web address].    Your next appointment:   As Needed  Provider:   Augustus Mealor, MD  

## 2023-05-04 NOTE — Progress Notes (Signed)
Electrophysiology Office Note:    Date:  05/04/2023   ID:  Patrick Romero, DOB May 12, 1961, MRN [id number]  PCP:  Marcine Matar, MD   [facility] Providers Cardiologist:  Lance Muss, MD Electrophysiologist:  Maurice Small, MD     Referring MD: Marcine Matar, MD   History of Present Illness:    Patrick Romero is a 62 y.o. male with a medical history significant for substance abuse, referred for management of syncope and bradycardia.     Over the years he has had several episodes of syncope.  These typically occur in the setting of alcohol use or dehydration.  1 episode resulted in a fall down the stairs with a C-spine injury and partial quadriplegia.  He has had 3 syncopal episodes so far this year.  Seems these all occurred in the setting of alcohol use.  Drug screen was cocaine positive on at least 1 occasion.  He notes a prodrome of hypersalivation, nausea, sweating.  Events are often brought on by pain --either headache, neck pain from his prior fracture, or leg cramps.  He notes that his symptoms will subside and he will not pass out if he lies down.  A monitor was placed that showed a pause of about 3.3 seconds with sinus bradycardia and AV block.  Today, he acknowledges that every occasion that he has had syncope occurred immediately after he started smoking a "blunt." He continues to smoke marijuana occasionally, but now rolls joints, which doesn't seem to be problematic for him.      Today in clinic he reports that he is feeling fine. He has not had any recurrence of near-syncope.   EKGs/Labs/Other Studies Reviewed Today:    Echocardiogram:  TTE 08/19/2022 EF 65-70%   Monitors:  Zio monitor 14 day 10/2022 -- my interpretation Sinus rhythm 52-178 bpm, avg 92 Occasional pauses occurred with both sinus slowing and AV block  Stress testing:   Advanced imaging:   Cardiac catherization   EKG:   EKG  Interpretation Date/Time:  Thursday May 04 2023 10:39:24 EDT Ventricular Rate:  87 PR Interval:  156 QRS Duration:  68 QT Interval:  354 QTC Calculation: 425 R Axis:   33  Text Interpretation: Normal sinus rhythm Right atrial enlargement Nonspecific T wave abnormality When compared with ECG of 09-Jan-2023 11:36, T wave inversion now evident in Inferior leads Nonspecific T wave abnormality now evident in Anterolateral leads Confirmed by York Pellant [phone]) on 05/04/2023 10:54:31 AM     Physical Exam:    VS:  BP 100/70 (BP Location: Left Arm, Patient Position: Sitting, Cuff Size: Normal)   Pulse 87   Ht 6' (1.829 m)   Wt 141 lb 3.2 oz (64 kg)   SpO2 98%   BMI 19.15 kg/m     Wt Readings from Last 3 Encounters:  05/04/23 141 lb 3.2 oz (64 kg)  01/09/23 139 lb (63 kg)  08/19/22 146 lb 13.2 oz (66.6 kg)     GEN:  Well nourished, well developed in no acute distress CARDIAC: RRR, no murmurs, rubs, gallops RESPIRATORY:  Normal work of breathing MUSCULOSKELETAL: no edema    ASSESSMENT & PLAN:    Recurrent syncope Neurocardiogenic. He has a typical prodrome, and monitor showed bradycardia with concomitant sinus slowing and AV block. Episodes are aborted with lying flat I think a pacemaker will be of little benefit despite that he does have documented bradycardia -- I suspect his syndrome is predominantly vasodepressor rather than  cardioinhibitory. I recommended he avoid alcohol intoxication and any other triggers (smoking blunts) and that he consume adequate fluids I instructed him to lie flat at earliest onset of symptoms.   Hypertension BP controlled in clinic today No changes today History of vasovagal syncope should not interfere with usual management of hypertension.     Signed, Maurice Small, MD  05/04/2023 10:56 AM    [facility]

## 2024-04-26 ENCOUNTER — Other Ambulatory Visit: Payer: Self-pay | Admitting: Family Medicine

## 2024-04-26 DIAGNOSIS — F17200 Nicotine dependence, unspecified, uncomplicated: Secondary | ICD-10-CM

## 2024-04-26 DIAGNOSIS — Z122 Encounter for screening for malignant neoplasm of respiratory organs: Secondary | ICD-10-CM

## 2024-04-27 LAB — LAB REPORT - SCANNED: EGFR: 90

## 2024-05-07 ENCOUNTER — Encounter: Payer: Self-pay | Admitting: Family Medicine

## 2024-05-10 ENCOUNTER — Other Ambulatory Visit

## 2024-05-13 ENCOUNTER — Inpatient Hospital Stay: Admission: RE | Admit: 2024-05-13 | Source: Ambulatory Visit

## 2024-06-05 ENCOUNTER — Ambulatory Visit
Admission: RE | Admit: 2024-06-05 | Discharge: 2024-06-05 | Disposition: A | Discharge status: Home / Self Care | Source: Ambulatory Visit | Attending: Family Medicine | Admitting: Family Medicine

## 2024-06-05 DIAGNOSIS — Z122 Encounter for screening for malignant neoplasm of respiratory organs: Secondary | ICD-10-CM

## 2024-06-05 DIAGNOSIS — F17200 Nicotine dependence, unspecified, uncomplicated: Secondary | ICD-10-CM

## 2024-06-12 ENCOUNTER — Encounter (HOSPITAL_COMMUNITY): Payer: Self-pay | Admitting: Licensed Clinical Social Worker

## 2024-06-12 ENCOUNTER — Telehealth (HOSPITAL_COMMUNITY): Payer: Self-pay | Admitting: Licensed Clinical Social Worker

## 2024-06-12 NOTE — Telephone Encounter (Signed)
 The therapist attempts to reach this patient in response to a referral sent by Dr. Haze. The therapist receives a message stating that the call cannot be completed at this time so sends an outreach letter to this patient via postal mail.  Patrick Maier, MA, LCSW, [facility], LCAS 06/12/2024
